# Patient Record
Sex: Female | Born: 1950 | Race: White | Hispanic: No | Marital: Single | Smoking: Former smoker
Health system: Southern US, Community
[De-identification: ages and names within clinical notes are randomized; demographics above are authoritative.]

## PROBLEM LIST (undated history)

## (undated) DIAGNOSIS — J189 Pneumonia, unspecified organism: Secondary | ICD-10-CM

## (undated) DIAGNOSIS — J310 Chronic rhinitis: Secondary | ICD-10-CM

## (undated) DIAGNOSIS — Z8619 Personal history of other infectious and parasitic diseases: Secondary | ICD-10-CM

## (undated) DIAGNOSIS — F32A Depression, unspecified: Secondary | ICD-10-CM

## (undated) DIAGNOSIS — M7022 Olecranon bursitis, left elbow: Secondary | ICD-10-CM

## (undated) DIAGNOSIS — J9801 Acute bronchospasm: Secondary | ICD-10-CM

## (undated) DIAGNOSIS — F419 Anxiety disorder, unspecified: Secondary | ICD-10-CM

## (undated) DIAGNOSIS — R011 Cardiac murmur, unspecified: Secondary | ICD-10-CM

## (undated) DIAGNOSIS — I639 Cerebral infarction, unspecified: Secondary | ICD-10-CM

## (undated) DIAGNOSIS — J321 Chronic frontal sinusitis: Secondary | ICD-10-CM

## (undated) DIAGNOSIS — Z720 Tobacco use: Secondary | ICD-10-CM

## (undated) DIAGNOSIS — J45909 Unspecified asthma, uncomplicated: Secondary | ICD-10-CM

## (undated) DIAGNOSIS — D696 Thrombocytopenia, unspecified: Secondary | ICD-10-CM

## (undated) DIAGNOSIS — J449 Chronic obstructive pulmonary disease, unspecified: Secondary | ICD-10-CM

## (undated) DIAGNOSIS — C9110 Chronic lymphocytic leukemia of B-cell type not having achieved remission: Secondary | ICD-10-CM

## (undated) DIAGNOSIS — C7931 Secondary malignant neoplasm of brain: Secondary | ICD-10-CM

## (undated) DIAGNOSIS — I671 Cerebral aneurysm, nonruptured: Secondary | ICD-10-CM

## (undated) DIAGNOSIS — C3490 Malignant neoplasm of unspecified part of unspecified bronchus or lung: Secondary | ICD-10-CM

## (undated) DIAGNOSIS — I4891 Unspecified atrial fibrillation: Secondary | ICD-10-CM

## (undated) DIAGNOSIS — I341 Nonrheumatic mitral (valve) prolapse: Secondary | ICD-10-CM

## (undated) DIAGNOSIS — J439 Emphysema, unspecified: Secondary | ICD-10-CM

## (undated) DIAGNOSIS — F329 Major depressive disorder, single episode, unspecified: Secondary | ICD-10-CM

## (undated) DIAGNOSIS — T7840XA Allergy, unspecified, initial encounter: Secondary | ICD-10-CM

## (undated) DIAGNOSIS — I340 Nonrheumatic mitral (valve) insufficiency: Secondary | ICD-10-CM

## (undated) DIAGNOSIS — Z8661 Personal history of infections of the central nervous system: Secondary | ICD-10-CM

## (undated) DIAGNOSIS — R51 Headache: Secondary | ICD-10-CM

## (undated) DIAGNOSIS — I071 Rheumatic tricuspid insufficiency: Secondary | ICD-10-CM

## (undated) HISTORY — DX: Personal history of infections of the central nervous system: Z86.61

## (undated) HISTORY — DX: Emphysema, unspecified: J43.9

## (undated) HISTORY — DX: Major depressive disorder, single episode, unspecified: F32.9

## (undated) HISTORY — DX: Depression, unspecified: F32.A

## (undated) HISTORY — DX: Cerebral infarction, unspecified: I63.9

## (undated) HISTORY — DX: Personal history of other infectious and parasitic diseases: Z86.19

## (undated) HISTORY — PX: ABDOMINAL HYSTERECTOMY: SHX81

## (undated) HISTORY — PX: COLONOSCOPY: SHX174

## (undated) HISTORY — DX: Nonrheumatic mitral (valve) prolapse: I34.1

## (undated) HISTORY — DX: Rheumatic tricuspid insufficiency: I07.1

## (undated) HISTORY — DX: Cardiac murmur, unspecified: R01.1

## (undated) HISTORY — DX: Allergy, unspecified, initial encounter: T78.40XA

## (undated) HISTORY — PX: UPPER GASTROINTESTINAL ENDOSCOPY: SHX188

## (undated) HISTORY — DX: Nonrheumatic mitral (valve) insufficiency: I34.0

## (undated) HISTORY — DX: Anxiety disorder, unspecified: F41.9

## (undated) HISTORY — DX: Chronic rhinitis: J31.0

---

## 2001-06-09 ENCOUNTER — Emergency Department (HOSPITAL_COMMUNITY): Admission: EM | Admit: 2001-06-09 | Discharge: 2001-06-09 | Payer: Self-pay | Admitting: Emergency Medicine

## 2002-04-16 DIAGNOSIS — C9110 Chronic lymphocytic leukemia of B-cell type not having achieved remission: Secondary | ICD-10-CM

## 2002-04-16 HISTORY — DX: Chronic lymphocytic leukemia of B-cell type not having achieved remission: C91.10

## 2002-09-02 ENCOUNTER — Other Ambulatory Visit: Admission: RE | Admit: 2002-09-02 | Discharge: 2002-09-02 | Payer: Self-pay | Admitting: Oncology

## 2004-07-03 ENCOUNTER — Emergency Department (HOSPITAL_COMMUNITY): Admission: EM | Admit: 2004-07-03 | Discharge: 2004-07-03 | Payer: Self-pay | Admitting: Emergency Medicine

## 2004-09-26 ENCOUNTER — Ambulatory Visit: Payer: Self-pay | Admitting: Oncology

## 2004-09-27 ENCOUNTER — Ambulatory Visit (HOSPITAL_COMMUNITY): Admission: RE | Admit: 2004-09-27 | Discharge: 2004-09-27 | Payer: Self-pay | Admitting: Oncology

## 2004-11-17 ENCOUNTER — Ambulatory Visit: Payer: Self-pay | Admitting: Oncology

## 2005-02-19 ENCOUNTER — Ambulatory Visit: Payer: Self-pay | Admitting: Oncology

## 2006-05-14 ENCOUNTER — Emergency Department (HOSPITAL_COMMUNITY): Admission: EM | Admit: 2006-05-14 | Discharge: 2006-05-14 | Payer: Self-pay | Admitting: Emergency Medicine

## 2006-11-04 ENCOUNTER — Emergency Department (HOSPITAL_COMMUNITY): Admission: EM | Admit: 2006-11-04 | Discharge: 2006-11-04 | Payer: Self-pay | Admitting: Emergency Medicine

## 2006-11-06 ENCOUNTER — Emergency Department (HOSPITAL_COMMUNITY): Admission: EM | Admit: 2006-11-06 | Discharge: 2006-11-06 | Payer: Self-pay | Admitting: Emergency Medicine

## 2007-01-08 ENCOUNTER — Emergency Department (HOSPITAL_COMMUNITY): Admission: EM | Admit: 2007-01-08 | Discharge: 2007-01-08 | Payer: Self-pay | Admitting: Emergency Medicine

## 2007-09-29 ENCOUNTER — Emergency Department (HOSPITAL_COMMUNITY): Admission: EM | Admit: 2007-09-29 | Discharge: 2007-09-29 | Payer: Self-pay | Admitting: Emergency Medicine

## 2007-10-03 ENCOUNTER — Emergency Department (HOSPITAL_COMMUNITY): Admission: EM | Admit: 2007-10-03 | Discharge: 2007-10-03 | Payer: Self-pay | Admitting: Emergency Medicine

## 2008-03-14 ENCOUNTER — Emergency Department (HOSPITAL_COMMUNITY): Admission: EM | Admit: 2008-03-14 | Discharge: 2008-03-15 | Payer: Self-pay | Admitting: Emergency Medicine

## 2008-03-17 ENCOUNTER — Ambulatory Visit: Payer: Self-pay | Admitting: Oncology

## 2008-04-20 LAB — CBC WITH DIFFERENTIAL/PLATELET
Basophils Absolute: 0.1 10*3/uL (ref 0.0–0.1)
Eosinophils Absolute: 0 10*3/uL (ref 0.0–0.5)
HCT: 42 % (ref 34.8–46.6)
HGB: 14 g/dL (ref 11.6–15.9)
LYMPH%: 83.6 % — ABNORMAL HIGH (ref 14.0–48.0)
MCHC: 33.3 g/dL (ref 32.0–36.0)
MONO#: 0.9 10*3/uL (ref 0.1–0.9)
NEUT#: 5.1 10*3/uL (ref 1.5–6.5)
NEUT%: 13.8 % — ABNORMAL LOW (ref 39.6–76.8)
Platelets: 127 10*3/uL — ABNORMAL LOW (ref 145–400)
WBC: 37.1 10*3/uL — ABNORMAL HIGH (ref 3.9–10.0)

## 2008-04-30 ENCOUNTER — Ambulatory Visit: Payer: Self-pay | Admitting: Oncology

## 2008-06-07 ENCOUNTER — Emergency Department (HOSPITAL_COMMUNITY): Admission: EM | Admit: 2008-06-07 | Discharge: 2008-06-07 | Payer: Self-pay | Admitting: Emergency Medicine

## 2008-10-25 ENCOUNTER — Emergency Department (HOSPITAL_COMMUNITY): Admission: EM | Admit: 2008-10-25 | Discharge: 2008-10-25 | Payer: Self-pay | Admitting: Emergency Medicine

## 2008-12-21 ENCOUNTER — Emergency Department (HOSPITAL_COMMUNITY): Admission: EM | Admit: 2008-12-21 | Discharge: 2008-12-21 | Payer: Self-pay | Admitting: Emergency Medicine

## 2009-02-10 ENCOUNTER — Emergency Department (HOSPITAL_COMMUNITY): Admission: EM | Admit: 2009-02-10 | Discharge: 2009-02-10 | Payer: Self-pay | Admitting: Emergency Medicine

## 2009-05-13 ENCOUNTER — Emergency Department (HOSPITAL_COMMUNITY): Admission: EM | Admit: 2009-05-13 | Discharge: 2009-05-13 | Payer: Self-pay | Admitting: Unknown Physician Specialty

## 2010-01-24 ENCOUNTER — Emergency Department (HOSPITAL_COMMUNITY): Admission: EM | Admit: 2010-01-24 | Discharge: 2010-01-24 | Payer: Self-pay | Admitting: Emergency Medicine

## 2010-03-11 ENCOUNTER — Inpatient Hospital Stay (HOSPITAL_COMMUNITY): Admission: EM | Admit: 2010-03-11 | Discharge: 2010-03-12 | Payer: Self-pay | Admitting: Emergency Medicine

## 2010-06-27 LAB — CBC
HCT: 38 % (ref 36.0–46.0)
Hemoglobin: 12.4 g/dL (ref 12.0–15.0)
Hemoglobin: 13.6 g/dL (ref 12.0–15.0)
MCH: 31.7 pg (ref 26.0–34.0)
MCHC: 32.6 g/dL (ref 30.0–36.0)
MCHC: 33.3 g/dL (ref 30.0–36.0)
MCV: 97.2 fL (ref 78.0–100.0)
RBC: 3.91 MIL/uL (ref 3.87–5.11)
RBC: 4.24 MIL/uL (ref 3.87–5.11)

## 2010-06-27 LAB — POCT CARDIAC MARKERS: Myoglobin, poc: 31.9 ng/mL (ref 12–200)

## 2010-06-27 LAB — DIFFERENTIAL
Basophils Absolute: 0 10*3/uL (ref 0.0–0.1)
Basophils Relative: 0 % (ref 0–1)
Eosinophils Relative: 0 % (ref 0–5)
Eosinophils Relative: 0 % (ref 0–5)
Lymphs Abs: 13.5 10*3/uL — ABNORMAL HIGH (ref 0.7–4.0)
Monocytes Absolute: 0.4 10*3/uL (ref 0.1–1.0)
Monocytes Absolute: 0.5 10*3/uL (ref 0.1–1.0)
Monocytes Relative: 2 % — ABNORMAL LOW (ref 3–12)
Neutrophils Relative %: 31 % — ABNORMAL LOW (ref 43–77)

## 2010-06-27 LAB — BASIC METABOLIC PANEL
CO2: 25 mEq/L (ref 19–32)
Calcium: 8.9 mg/dL (ref 8.4–10.5)
Chloride: 111 mEq/L (ref 96–112)
GFR calc Af Amer: >60 mL/min (ref >60)
GFR calc non Af Amer: >60 mL/min (ref >60)
Glucose, Bld: 143 mg/dL — ABNORMAL HIGH (ref 70–99)
Glucose, Bld: 88 mg/dL (ref 70–99)
Potassium: 4.8 mEq/L (ref 3.5–5.1)
Sodium: 140 mEq/L (ref 135–145)
Sodium: 142 mEq/L (ref 135–145)

## 2010-06-27 LAB — CULTURE, BLOOD (ROUTINE X 2): Culture: NO GROWTH

## 2010-06-27 LAB — LEGIONELLA ANTIGEN, URINE: Legionella Antigen, Urine: NEGATIVE

## 2010-06-27 LAB — MAGNESIUM: Magnesium: 1.9 mg/dL (ref 1.5–2.5)

## 2010-07-21 LAB — STREP A DNA PROBE: Group A Strep Probe: NEGATIVE

## 2010-12-13 ENCOUNTER — Emergency Department (HOSPITAL_COMMUNITY)
Admission: EM | Admit: 2010-12-13 | Discharge: 2010-12-13 | Disposition: A | Discharge status: Home / Self Care | Payer: Self-pay | Attending: Emergency Medicine | Admitting: Emergency Medicine

## 2010-12-13 ENCOUNTER — Emergency Department (HOSPITAL_COMMUNITY): Payer: Self-pay

## 2010-12-13 ENCOUNTER — Other Ambulatory Visit: Payer: Self-pay

## 2010-12-13 ENCOUNTER — Encounter: Payer: Self-pay | Admitting: *Deleted

## 2010-12-13 DIAGNOSIS — J4489 Other specified chronic obstructive pulmonary disease: Secondary | ICD-10-CM | POA: Insufficient documentation

## 2010-12-13 DIAGNOSIS — C9110 Chronic lymphocytic leukemia of B-cell type not having achieved remission: Secondary | ICD-10-CM | POA: Insufficient documentation

## 2010-12-13 DIAGNOSIS — Z7982 Long term (current) use of aspirin: Secondary | ICD-10-CM | POA: Insufficient documentation

## 2010-12-13 DIAGNOSIS — R000 Tachycardia, unspecified: Secondary | ICD-10-CM | POA: Insufficient documentation

## 2010-12-13 DIAGNOSIS — Z9079 Acquired absence of other genital organ(s): Secondary | ICD-10-CM | POA: Insufficient documentation

## 2010-12-13 DIAGNOSIS — J449 Chronic obstructive pulmonary disease, unspecified: Secondary | ICD-10-CM | POA: Insufficient documentation

## 2010-12-13 DIAGNOSIS — R002 Palpitations: Secondary | ICD-10-CM

## 2010-12-13 DIAGNOSIS — E876 Hypokalemia: Secondary | ICD-10-CM

## 2010-12-13 DIAGNOSIS — H6120 Impacted cerumen, unspecified ear: Secondary | ICD-10-CM | POA: Insufficient documentation

## 2010-12-13 DIAGNOSIS — F17200 Nicotine dependence, unspecified, uncomplicated: Secondary | ICD-10-CM | POA: Insufficient documentation

## 2010-12-13 DIAGNOSIS — R51 Headache: Secondary | ICD-10-CM | POA: Insufficient documentation

## 2010-12-13 LAB — BASIC METABOLIC PANEL
CO2: 29 mEq/L (ref 19–32)
Calcium: 9.4 mg/dL (ref 8.4–10.5)
Chloride: 101 mEq/L (ref 96–112)
Creatinine, Ser: 0.53 mg/dL (ref 0.50–1.10)
Glucose, Bld: 138 mg/dL — ABNORMAL HIGH (ref 70–99)

## 2010-12-13 LAB — DIFFERENTIAL
Basophils Relative: 0 % (ref 0–1)
Eosinophils Absolute: 0 10*3/uL (ref 0.0–0.7)
Eosinophils Relative: 0 % (ref 0–5)
Monocytes Absolute: 0.4 10*3/uL (ref 0.1–1.0)
Neutro Abs: 5.1 10*3/uL (ref 1.7–7.7)

## 2010-12-13 LAB — CBC
HCT: 45.6 % (ref 36.0–46.0)
Hemoglobin: 15 g/dL (ref 12.0–15.0)
MCH: 30.9 pg (ref 26.0–34.0)
MCHC: 32.9 g/dL (ref 30.0–36.0)
MCV: 94 fL (ref 78.0–100.0)
RBC: 4.85 MIL/uL (ref 3.87–5.11)

## 2010-12-13 LAB — CARDIAC PANEL(CRET KIN+CKTOT+MB+TROPI)
CK, MB: 2.3 ng/mL (ref 0.3–4.0)
Relative Index: INVALID (ref 0.0–2.5)
Total CK: 29 U/L (ref 7–177)

## 2010-12-13 LAB — SEDIMENTATION RATE: Sed Rate: 5 mm/hr (ref 0–22)

## 2010-12-13 MED ORDER — KETOROLAC TROMETHAMINE 30 MG/ML IJ SOLN
30.0000 mg | Freq: Once | INTRAMUSCULAR | Status: AC
  Administered 2010-12-13: 30 mg via INTRAVENOUS
  Filled 2010-12-13: qty 1

## 2010-12-13 MED ORDER — POTASSIUM CHLORIDE ER 10 MEQ PO TBCR: 10.0000 meq | EXTENDED_RELEASE_TABLET | Freq: Two times a day (BID) | ORAL | Status: DC

## 2010-12-13 MED ORDER — LORAZEPAM 1 MG PO TABS
1.0000 mg | ORAL_TABLET | Freq: Once | ORAL | Status: AC
  Administered 2010-12-13: 1 mg via ORAL
  Filled 2010-12-13: qty 1

## 2010-12-13 MED ORDER — LORAZEPAM 1 MG PO TABS: 1.0000 mg | ORAL_TABLET | Freq: Two times a day (BID) | ORAL | Status: AC | PRN

## 2010-12-13 MED ORDER — OXYCODONE-ACETAMINOPHEN 5-325 MG PO TABS
2.0000 | ORAL_TABLET | Freq: Once | ORAL | Status: AC
  Administered 2010-12-13: 2 via ORAL
  Filled 2010-12-13: qty 2

## 2010-12-13 MED ORDER — NAPROXEN 500 MG PO TABS: 500.0000 mg | ORAL_TABLET | Freq: Two times a day (BID) | ORAL | Status: DC

## 2010-12-13 NOTE — ED Provider Notes (Signed)
History   Scribed for Vida Roller, MD, the patient was seen in room APA02/APA02. This chart was scribed by Clarita Crane. This patient's care was started at 1:14PM.   CSN: [id number] Arrival date & time: 12/13/2010  1:05 PM  Chief Complaint  Patient presents with  . Headache  . Tachycardia   HPI Stephenie I Tatum is a 60 y.o. female who presents to the Emergency Department complaining of intermittent sharp and shooting HA to right forehead onset 3 days ago and persistent since with associated sinus pressure and difficulty hearing from right ear. Reports previous history of HAs but states current HA is not similar to those previously experienced. Notes HA mildly relieved with use of Goody Powder. Patient also c/o intermittent episodes of palpitations lasting 1 hour and described as "my heart beating out of my chest". Notes episodes are not relieved by anything and resolve on their own. Denies nasal congestion, rhinorrhea, vomiting, weakness, changes in vision, change in gait, rash, fever, neck pain, stiff neck.    Past Medical History  Diagnosis Date  . Leukemia     Past Surgical History  Procedure Date  . Abdominal hysterectomy     No family history on file.  History  Substance Use Topics  . Smoking status: Current Everyday Smoker -- 1.0 packs/day    Types: Cigarettes  . Smokeless tobacco: Not on file  . Alcohol Use: No    OB History    Grav Para Term Preterm Abortions TAB SAB Ect Mult Living                  Review of Systems 10 Systems reviewed and are negative for acute change except as noted in the HPI.  Physical Exam  BP 140/66  Pulse 82  Temp(Src) 99.2 F (37.3 C) (Oral)  Resp 18  Ht 5\' 10"  (1.778 m)  Wt 110 lb (49.896 kg)  BMI 15.78 kg/m2  SpO2 95%  Physical Exam  Nursing note and vitals reviewed. Constitutional: She is oriented to person, place, and time. She appears well-developed and well-nourished. No distress.  HENT:  Head: Normocephalic and  atraumatic.  Mouth/Throat: Oropharynx is clear and moist.       Left and Right TM occluded by cerumen. Tenderness to palpation of right temporal artery.   Eyes: Conjunctivae are normal. Pupils are equal, round, and reactive to light.  Neck: Neck supple.       No meningeal signs.   Cardiovascular: Normal rate and regular rhythm.  Exam reveals no gallop and no friction rub.   No murmur heard. Pulmonary/Chest: Effort normal and breath sounds normal. She has no wheezes. She exhibits no tenderness.  Abdominal: Soft. Bowel sounds are normal. She exhibits no distension. There is no tenderness.  Musculoskeletal: Normal range of motion. She exhibits no edema.        Entire spine non-tender. No paraspinal tenderness.   Neurological: She is alert and oriented to person, place, and time. No sensory deficit.       No focal motor or sensory deficits.   Skin: Skin is warm and dry.  Psychiatric: She has a normal mood and affect. Her behavior is normal.    ED Course  Procedures  MDM Well appaering, no focal neuro findings or cariac findings.    Results for orders placed during the hospital encounter of 12/13/10  CBC      Component Value Range   WBC 42.4 (*) 4.0 - 10.5 (K/uL)   RBC 4.85  3.87 -  5.11 (MIL/uL)   Hemoglobin 15.0  12.0 - 15.0 (g/dL)   HCT 10.2  72.5 - 36.6 (%)   MCV 94.0  78.0 - 100.0 (fL)   MCH 30.9  26.0 - 34.0 (pg)   MCHC 32.9  30.0 - 36.0 (g/dL)   RDW 44.0  34.7 - 42.5 (%)   Platelets 155  150 - 400 (K/uL)  DIFFERENTIAL      Component Value Range   Neutrophils Relative 12 (*) 43 - 77 (%)   Lymphocytes Relative 87 (*) 12 - 46 (%)   Monocytes Relative 1 (*) 3 - 12 (%)   Eosinophils Relative 0  0 - 5 (%)   Basophils Relative 0  0 - 1 (%)   Neutro Abs 5.1  1.7 - 7.7 (K/uL)   Lymphs Abs 36.9 (*) 0.7 - 4.0 (K/uL)   Monocytes Absolute 0.4  0.1 - 1.0 (K/uL)   Eosinophils Absolute 0.0  0.0 - 0.7 (K/uL)   Basophils Absolute 0.0  0.0 - 0.1 (K/uL)   WBC Morphology WHITE COUNT  CONFIRMED ON SMEAR     Smear Review LARGE PLATELETS PRESENT    BASIC METABOLIC PANEL      Component Value Range   Sodium 141  135 - 145 (mEq/L)   Potassium 3.4 (*) 3.5 - 5.1 (mEq/L)   Chloride 101  96 - 112 (mEq/L)   CO2 29  19 - 32 (mEq/L)   Glucose, Bld 138 (*) 70 - 99 (mg/dL)   BUN 12  6 - 23 (mg/dL)   Creatinine, Ser 9.56  0.50 - 1.10 (mg/dL)   Calcium 9.4  8.4 - 38.7 (mg/dL)   GFR calc non Af Amer >60  >60 (mL/min)   GFR calc Af Amer >60  >60 (mL/min)  CARDIAC PANEL(CRET KIN+CKTOT+MB+TROPI)      Component Value Range   Total CK 29  7 - 177 (U/L)   CK, MB 2.3  0.3 - 4.0 (ng/mL)   Troponin I <0.30  <0.30 (ng/mL)   Relative Index RELATIVE INDEX IS INVALID  0.0 - 2.5    Dg Chest 2 View  12/13/2010  *RADIOLOGY REPORT*  Clinical Data: Palpitations, history smoking, COPD  CHEST - 2 VIEW  Comparison: 03/11/2010  Findings: Normal heart size, mediastinal contours, and pulmonary vascularity. Emphysematous changes with biapical scarring slightly greater on left. Minimal peribronchial thickening. No pulmonary infiltrate, pleural effusion, or pneumothorax. Mild biconvex thoracolumbar scoliosis.  IMPRESSION: Emphysematous and chronic bronchitic changes with biapical scarring. No acute abnormalities.  Original Report Authenticated By: Lollie Marrow, M.D.   Ct Head Wo Contrast  12/13/2010  *RADIOLOGY REPORT*  Clinical Data: Headache.  CT HEAD WITHOUT CONTRAST  Technique:  Contiguous axial images were obtained from the base of the skull through the vertex without contrast.  Comparison: 02/10/2009  Findings: Mild hypodensity in the inferior frontal white matter bilaterally is unchanged.  Negative for acute infarct.  Ventricles are normal.  Negative for hemorrhage or mass.  Calvarium is intact.  IMPRESSION: Mild hypodensity in the inferior frontal lobes bilaterally is stable and  may be due to chronic ischemia.  No acute abnormality.  Original Report Authenticated By: Camelia Phenes, M.D.   ED ECG  REPORT   Date: 12/13/2010 13:17 PM  Rate: 90  Rhythm: normal sinus rhythm  QRS Axis: normal  Intervals: normal  ST/T Wave abnormalities: normal  Conduction Disutrbances:none  Narrative Interpretation:   Old EKG Reviewed: none available  Blood work reviewed and reveals, high WBC - she is  known to have CLL and is not currently undergoing treatment for same.  She has normal CXR and has CT showing no acute findings.  Toradol with some improvement, percocet ordered.  She states she is very anxious all the time and thinks it may be anxiety - while she is having palpitations here I have auscultated her chest and find her to be in NSR at 80 bpm.  I will refer to doctor for card monitoring as outpt and pt has been informed of her WBC and to f/u with Oncology.  Sed rate normal at 5 per lab phone call.    I personally performed the services described in this documentation, which was scribed in my presence. The recorded information has been reviewed and considered. Vida Roller, MD    Vida Roller, MD 12/13/10 [phone]

## 2010-12-13 NOTE — ED Notes (Signed)
Pt c/o headache and feels like her heart is racing, states that it started 3-4 days ago, has been taking goody powders at home, denies any chest pain states that her heart just feels like it will beat out of her chest.

## 2011-01-16 LAB — DIFFERENTIAL
Basophils Relative: 0
Eosinophils Absolute: 0
Monocytes Absolute: 0.4
Neutro Abs: 5.6

## 2011-01-16 LAB — COMPREHENSIVE METABOLIC PANEL
AST: 18
Albumin: 4
Alkaline Phosphatase: 56
Chloride: 104
GFR calc Af Amer: >60
Potassium: 3.4 — ABNORMAL LOW
Sodium: 139
Total Bilirubin: 0.6

## 2011-01-16 LAB — URINALYSIS, ROUTINE W REFLEX MICROSCOPIC
Bilirubin Urine: NEGATIVE
Ketones, ur: NEGATIVE
Urobilinogen, UA: 0.2
pH: 5.5

## 2011-01-16 LAB — URINE MICROSCOPIC-ADD ON

## 2011-01-16 LAB — CBC
Platelets: 138 — ABNORMAL LOW
WBC: 40.1 — ABNORMAL HIGH

## 2011-01-25 LAB — STREP A DNA PROBE: Group A Strep Probe: NEGATIVE

## 2011-02-15 DIAGNOSIS — J321 Chronic frontal sinusitis: Secondary | ICD-10-CM

## 2011-02-15 DIAGNOSIS — M7022 Olecranon bursitis, left elbow: Secondary | ICD-10-CM

## 2011-02-15 HISTORY — DX: Chronic frontal sinusitis: J32.1

## 2011-02-15 HISTORY — DX: Olecranon bursitis, left elbow: M70.22

## 2011-03-09 ENCOUNTER — Encounter (HOSPITAL_COMMUNITY): Payer: Self-pay | Admitting: *Deleted

## 2011-03-09 ENCOUNTER — Inpatient Hospital Stay (HOSPITAL_COMMUNITY)
Admission: EM | Admit: 2011-03-09 | Discharge: 2011-03-12 | DRG: 603 | Disposition: A | Discharge status: Home / Self Care | Payer: Self-pay | Attending: Internal Medicine | Admitting: Internal Medicine

## 2011-03-09 ENCOUNTER — Emergency Department (HOSPITAL_COMMUNITY): Payer: Self-pay

## 2011-03-09 DIAGNOSIS — J0110 Acute frontal sinusitis, unspecified: Secondary | ICD-10-CM | POA: Diagnosis present

## 2011-03-09 DIAGNOSIS — IMO0002 Reserved for concepts with insufficient information to code with codable children: Principal | ICD-10-CM | POA: Diagnosis present

## 2011-03-09 DIAGNOSIS — M7030 Other bursitis of elbow, unspecified elbow: Secondary | ICD-10-CM | POA: Diagnosis present

## 2011-03-09 DIAGNOSIS — M7020 Olecranon bursitis, unspecified elbow: Secondary | ICD-10-CM | POA: Diagnosis present

## 2011-03-09 DIAGNOSIS — C9110 Chronic lymphocytic leukemia of B-cell type not having achieved remission: Secondary | ICD-10-CM | POA: Diagnosis present

## 2011-03-09 DIAGNOSIS — F17210 Nicotine dependence, cigarettes, uncomplicated: Secondary | ICD-10-CM | POA: Diagnosis present

## 2011-03-09 DIAGNOSIS — L03114 Cellulitis of left upper limb: Secondary | ICD-10-CM

## 2011-03-09 DIAGNOSIS — F17200 Nicotine dependence, unspecified, uncomplicated: Secondary | ICD-10-CM | POA: Diagnosis present

## 2011-03-09 HISTORY — DX: Chronic lymphocytic leukemia of B-cell type not having achieved remission: C91.10

## 2011-03-09 HISTORY — DX: Headache: R51

## 2011-03-09 HISTORY — DX: Acute bronchospasm: J98.01

## 2011-03-09 HISTORY — DX: Pneumonia, unspecified organism: J18.9

## 2011-03-09 LAB — BASIC METABOLIC PANEL
GFR calc Af Amer: >90 mL/min (ref >90)
GFR calc non Af Amer: >90 mL/min (ref >90)
Potassium: 3.6 mEq/L (ref 3.5–5.1)
Sodium: 140 mEq/L (ref 135–145)

## 2011-03-09 LAB — DIFFERENTIAL
Basophils Absolute: 0.1 10*3/uL (ref 0.0–0.1)
Basophils Relative: 0 % (ref 0–1)
Neutro Abs: 8.1 10*3/uL — ABNORMAL HIGH (ref 1.7–7.7)
Neutrophils Relative %: 24 % — ABNORMAL LOW (ref 43–77)

## 2011-03-09 LAB — CBC
Hemoglobin: 13.2 g/dL (ref 12.0–15.0)
MCHC: 32.9 g/dL (ref 30.0–36.0)
RDW: 14.3 % (ref 11.5–15.5)

## 2011-03-09 LAB — URIC ACID: Uric Acid, Serum: 1.7 mg/dL — ABNORMAL LOW (ref 2.4–7.0)

## 2011-03-09 MED ORDER — DOCUSATE SODIUM 100 MG PO CAPS
100.0000 mg | ORAL_CAPSULE | Freq: Two times a day (BID) | ORAL | Status: DC
  Administered 2011-03-09 – 2011-03-12 (×6): 100 mg via ORAL
  Filled 2011-03-09 (×6): qty 1

## 2011-03-09 MED ORDER — ACETAMINOPHEN 325 MG PO TABS: 650.0000 mg | ORAL_TABLET | Freq: Four times a day (QID) | ORAL | Status: DC | PRN

## 2011-03-09 MED ORDER — ACETAMINOPHEN 650 MG RE SUPP: 650.0000 mg | Freq: Four times a day (QID) | RECTAL | Status: DC | PRN

## 2011-03-09 MED ORDER — MORPHINE SULFATE 4 MG/ML IJ SOLN
2.0000 mg | INTRAMUSCULAR | Status: DC | PRN
  Administered 2011-03-10 – 2011-03-11 (×3): 2 mg via INTRAVENOUS
  Filled 2011-03-09 (×3): qty 1

## 2011-03-09 MED ORDER — ALBUTEROL SULFATE HFA 108 (90 BASE) MCG/ACT IN AERS: 2.0000 | INHALATION_SPRAY | RESPIRATORY_TRACT | Status: DC | PRN

## 2011-03-09 MED ORDER — ACETAMINOPHEN 325 MG PO TABS
650.0000 mg | ORAL_TABLET | Freq: Once | ORAL | Status: AC
  Administered 2011-03-09: 650 mg via ORAL
  Filled 2011-03-09: qty 2

## 2011-03-09 MED ORDER — NICOTINE 14 MG/24HR TD PT24
14.0000 mg | MEDICATED_PATCH | Freq: Every day | TRANSDERMAL | Status: DC
  Administered 2011-03-10 – 2011-03-12 (×4): 14 mg via TRANSDERMAL
  Filled 2011-03-09 (×4): qty 1

## 2011-03-09 MED ORDER — OXYCODONE HCL 5 MG PO TABS
5.0000 mg | ORAL_TABLET | ORAL | Status: DC | PRN
  Administered 2011-03-09 – 2011-03-12 (×5): 5 mg via ORAL
  Filled 2011-03-09 (×5): qty 1

## 2011-03-09 MED ORDER — ONDANSETRON HCL 4 MG PO TABS: 4.0000 mg | ORAL_TABLET | Freq: Four times a day (QID) | ORAL | Status: DC | PRN

## 2011-03-09 MED ORDER — ONDANSETRON HCL 4 MG/2ML IJ SOLN: 4.0000 mg | Freq: Four times a day (QID) | INTRAMUSCULAR | Status: DC | PRN

## 2011-03-09 MED ORDER — VANCOMYCIN HCL IN DEXTROSE 1-5 GM/200ML-% IV SOLN
1000.0000 mg | Freq: Once | INTRAVENOUS | Status: AC
  Administered 2011-03-09: 1000 mg via INTRAVENOUS
  Filled 2011-03-09: qty 200

## 2011-03-09 MED ORDER — INFLUENZA VIRUS VACC SPLIT PF IM SUSP
0.5000 mL | INTRAMUSCULAR | Status: AC
  Administered 2011-03-10: 0.5 mL via INTRAMUSCULAR
  Filled 2011-03-09: qty 0.5

## 2011-03-09 MED ORDER — SENNA 8.6 MG PO TABS: 2.0000 | ORAL_TABLET | Freq: Every day | ORAL | Status: DC | PRN

## 2011-03-09 MED ORDER — SODIUM CHLORIDE 0.9 % IV SOLN
INTRAVENOUS | Status: DC
  Administered 2011-03-09: via INTRAVENOUS

## 2011-03-09 NOTE — ED Provider Notes (Signed)
History     CSN: [id number] Arrival date & time: 03/09/2011  7:28 PM   Chief Complaint  Patient presents with  . Joint Swelling    HPI Pt was seen at 1945.  Per pt, c/o gradual onset and worsening of constant left elbow "rash" and "swelling" x2 days.  Has been assoc with subjective home fevers/chills.  Denies injury, no focal motor weakness, no tingling/numbness in extremities.  Also c/o gradual onset and persistence of constant acute flair of her chronic headache, associated with mild nausea, for the past several days.  Denies any change in her usual chronic headache pain pattern, denies headache was sudden or maximal at onset or at any time, no vomiting/diarrhea, no visual changes.   Past Medical History  Diagnosis Date  . Headache   . Chronic lymphocytic leukemia   . Pleurisy   . Bronchospasm     Past Surgical History  Procedure Date  . Abdominal hysterectomy     History  Substance Use Topics  . Smoking status: Current Everyday Smoker -- 1.0 packs/day    Types: Cigarettes  . Smokeless tobacco: Not on file  . Alcohol Use: No   Review of Systems ROS: Statement: All systems negative except as marked or noted in the HPI; Constitutional: +subjective fever and chills. ; ; Eyes: Negative for eye pain, redness and discharge. ; ; ENMT: Negative for ear pain, hoarseness, nasal congestion, sinus pressure and sore throat. ; ; Cardiovascular: Negative for chest pain, palpitations, diaphoresis, dyspnea and peripheral edema. ; ; Respiratory: Negative for cough, wheezing and stridor. ; ; Gastrointestinal: +nausea. Negative for vomiting, diarrhea and abdominal pain, blood in stool, hematemesis, jaundice and rectal bleeding. . ; ; Genitourinary: Negative for dysuria, flank pain and hematuria. ; ; Musculoskeletal: Negative for back pain and neck pain. Negative for trauma.; ; Skin: +left elbow rash and swelling.  Negative for pruritus, abrasions, blisters, bruising and skin lesion.; ; Neuro:  +headache. Negative for lightheadedness and neck stiffness. Negative for weakness, altered level of consciousness , altered mental status, extremity weakness, paresthesias, involuntary movement, seizure and syncope.     Allergies  Penicillins  Home Medications   Current Outpatient Rx  Name Route Sig Dispense Refill  . ALBUTEROL 90 MCG/ACT IN AERS Inhalation Inhale 2 puffs into the lungs every 6 (six) hours as needed. Asthma Symptoms     . GOODY HEADACHE PO Oral Take 1 packet by mouth daily as needed. Headaches     . OXYMETAZOLINE HCL 0.05 % NA SOLN Nasal Place 2 sprays into the nose daily.        BP 98/58  Pulse 70  Temp(Src) 98.6 F (37 C) (Oral)  Resp 18  Ht 5\' 10"  (1.778 m)  Wt 115 lb (52.164 kg)  BMI 16.50 kg/m2  SpO2 95%  Physical Exam 1950: Physical examination:  Nursing notes reviewed; Vital signs and O2 SAT reviewed;  Constitutional: Well developed, Well nourished, Well hydrated, In no acute distress; Head:  Normocephalic, atraumatic; Eyes: EOMI, PERRL, No scleral icterus; ENMT: Mouth and pharynx normal, Mucous membranes moist; Neck: Supple, Full range of motion, No lymphadenopathy; Cardiovascular: Regular rate and rhythm, No murmur, rub, or gallop; Respiratory: Breath sounds clear & equal bilaterally, No rales, rhonchi, wheezes, or rub, Normal respiratory effort/excursion; Chest: Nontender, Movement normal; Abdomen: Soft, Nontender, Nondistended, Normal bowel sounds; Extremities: Pulses normal, No left axillary lymphadenopathy. No calf edema or asymmetry.; Neuro: AA&Ox3, Major CN grossly intact. No facial droop, speech clear.  No gross focal motor or sensory  deficits in extremities.; Skin: Color normal, Warm, Dry, +localized edema and erythema to right elbow which streaks up medial upper arm, proximal-medial volar forearm, and wraps around to antecubital area.   ED Course  Procedures    MDM  MDM Reviewed: nursing note and vitals Reviewed previous: labs Interpretation:  labs and x-ray   Results for orders placed during the hospital encounter of 03/09/11  CBC      Component Value Range   WBC 33.9 (*) 4.0 - 10.5 (K/uL)   RBC 4.19  3.87 - 5.11 (MIL/uL)   Hemoglobin 13.2  12.0 - 15.0 (g/dL)   HCT 47.8  29.5 - 62.1 (%)   MCV 95.7  78.0 - 100.0 (fL)   MCH 31.5  26.0 - 34.0 (pg)   MCHC 32.9  30.0 - 36.0 (g/dL)   RDW 30.8  65.7 - 84.6 (%)   Platelets 120 (*) 150 - 400 (K/uL)  DIFFERENTIAL      Component Value Range   Neutrophils Relative 24 (*) 43 - 77 (%)   Neutro Abs 8.1 (*) 1.7 - 7.7 (K/uL)   Lymphocytes Relative 76 (*) 12 - 46 (%)   Lymphs Abs 25.7 (*) 0.7 - 4.0 (K/uL)   Monocytes Relative 0 (*) 3 - 12 (%)   Monocytes Absolute 0.0 (*) 0.1 - 1.0 (K/uL)   Eosinophils Relative 0  0 - 5 (%)   Eosinophils Absolute 0.1  0.0 - 0.7 (K/uL)   Basophils Relative 0  0 - 1 (%)   Basophils Absolute 0.1  0.0 - 0.1 (K/uL)   WBC Morphology WHITE COUNT CONFIRMED ON SMEAR    BASIC METABOLIC PANEL      Component Value Range   Sodium 140  135 - 145 (mEq/L)   Potassium 3.6  3.5 - 5.1 (mEq/L)   Chloride 104  96 - 112 (mEq/L)   CO2 28  19 - 32 (mEq/L)   Glucose, Bld 95  70 - 99 (mg/dL)   BUN 8  6 - 23 (mg/dL)   Creatinine, Ser 9.62  0.50 - 1.10 (mg/dL)   Calcium 9.1  8.4 - 95.2 (mg/dL)   GFR calc non Af Amer >90  >90 (mL/min)   GFR calc Af Amer >90  >90 (mL/min)  LACTIC ACID, PLASMA      Component Value Range   Lactic Acid, Venous 1.0  0.5 - 2.2 (mmol/L)   Dg Elbow Complete Left  03/09/2011  *RADIOLOGY REPORT*  Clinical Data: Left elbow pain, swelling and redness.  LEFT ELBOW - COMPLETE 3+ VIEW  Comparison: The  Findings: There is soft tissue swelling over the olecranon.  No definite joint effusion.  No fracture.  IMPRESSION: Soft tissue swelling over the olecranon is indicative of olecranon bursitis.  Original Report Authenticated By: Reyes Ivan, M.D.   9:43 PM:  Unable to aspirate left olecranon bursa due to overlying diffuse cellulitic rash.  Rash also  streaking up and down arm and wrapping around to antecubital fossa.  No fevers in ED.  WBC elevated, but per pt's norm/baseline.  No fevers in ED but reports "chills" at home. Cellulitis vs bursitis at this time.  Will dose IV vancomycin.  Headache improved with tylenol, no neuro changes.  Dx testing d/w pt and family.  Questions answered.  Verb understanding, agreeable to admit. T/C to [facility] Dr. Rito Ehrlich, case discussed, including:  HPI, pertinent PM/SHx, VS/PE, dx testing, ED course and treatment.  Agreeable to admit.  Requests to write temporary orders, medical bed.  [facility] M I personally performed the services described in this documentation, which was scribed in my presence. The recorded information has been reviewed and considered.        Laray Anger, DO 03/10/11 2258

## 2011-03-09 NOTE — H&P (Signed)
Sheri Ayala is an 60 y.o. female.    PCP: DAVIS,JEROME, MD her oncologist is Dr. Alona Bene in [place].  Chief Complaint: Pain in the left elbow  HPI: This is a 60 year old, Caucasian female, with a past medical history of CLL in remission, who was in her usual state of health about 2 days ago, when she started noticing that the left elbow was reddish in color, and had pain in that area. She applied ice to the region. However, the pain increased. The redness spread to the upper arm as well as the forearm. Denied any fever, but did have chills. Pain is 9/10 in intensity and is made worse by movement of the elbow. Ice seems to help. No radiation of the pain. She has some nausea, but denies any vomiting. Denies any abdominal pain. Denies any problems urinating. Has been having headaches, but this is a known issue for her. She mentioned that years ago she had fluid in that joint, which was removed, with a needle. She remains unclear as to how, long ago this was.   Prior to Admission medications   Medication Sig Start Date End Date Taking? Authorizing Provider  albuterol (PROVENTIL,VENTOLIN) 90 MCG/ACT inhaler Inhale 2 puffs into the lungs every 6 (six) hours as needed. Asthma Symptoms    Yes Historical Provider, MD  Aspirin-Acetaminophen-Caffeine (GOODY HEADACHE PO) Take 1 packet by mouth daily as needed. Headaches    Yes Historical Provider, MD  oxymetazoline (NASAL) 0.05 % nasal spray Place 2 sprays into the nose daily.     Yes Historical Provider, MD    Allergies:  Allergies  Allergen Reactions  . Penicillins Hives and Nausea And Vomiting    Past Medical History  Diagnosis Date  . Headache   . Chronic lymphocytic leukemia   . Pleurisy   . Bronchospasm     Past Surgical History  Procedure Date  . Abdominal hysterectomy     Social History:  reports that she has been smoking Cigarettes.  She has been smoking about 1 pack per day. She does not have any smokeless tobacco history on file.  She reports that she does not drink alcohol or use illicit drugs.  Family History: Mother had brain and thoracic aneurysm  Review of Systems - History obtained from the patient General ROS: negative Psychological ROS: negative ENT ROS: negative Allergy and Immunology ROS: negative Hematological and Lymphatic ROS: negative Endocrine ROS: negative Respiratory ROS: no cough, shortness of breath, or wheezing Cardiovascular ROS: no chest pain or dyspnea on exertion Gastrointestinal ROS: no abdominal pain, change in bowel habits, or black or bloody stools Genito-Urinary ROS: no dysuria, trouble voiding, or hematuria Musculoskeletal ROS: As in HPI Neurological ROS: negative Dermatological ROS: as in hpi Otherwise negative  Physical Examination Blood pressure 98/58, pulse 70, temperature 98.6 F (37 C), temperature source Oral, resp. rate 18, height 5\' 10"  (1.778 m), weight 52.164 kg (115 lb), SpO2 95.00%.   General appearance: alert, cooperative, appears older than stated age and no distress Head: Normocephalic, without obvious abnormality, atraumatic Eyes: conjunctivae/corneas clear. PERRL, EOM's intact. Fundi benign. Nose: Nares normal. Septum midline. Mucosa normal. No drainage or sinus tenderness. Neck: no adenopathy, no carotid bruit, no JVD, supple, symmetrical, trachea midline and thyroid not enlarged, symmetric, no tenderness/mass/nodules Resp: clear to auscultation bilaterally Cardio: regular rate and rhythm, S1, S2 normal, no murmur, click, rub or gallop GI: soft, non-tender; bowel sounds normal; no masses,  no organomegaly Extremities: Left elbow is swollen, erythematous, tender. Restricted ROM. Good  pulses. No axillary lymphandeopathy. Redness spread to upper arm and forearm. Pulses: 2+ and symmetric Skin: erythema over left elbow Lymph nodes: Cervical, supraclavicular, and axillary nodes normal. Neurologic: Alert and oriented X 3, normal strength and tone. Normal symmetric  reflexes. Normal coordination and gait Redness over left elbow  Results for orders placed during the hospital encounter of 03/09/11 (from the past 48 hour(s))  CBC     Status: Abnormal   Collection Time   03/09/11  8:02 PM      Component Value Range Comment   WBC 33.9 (*) 4.0 - 10.5 (K/uL)    RBC 4.19  3.87 - 5.11 (MIL/uL)    Hemoglobin 13.2  12.0 - 15.0 (g/dL)    HCT 16.1  09.6 - 04.5 (%)    MCV 95.7  78.0 - 100.0 (fL)    MCH 31.5  26.0 - 34.0 (pg)    MCHC 32.9  30.0 - 36.0 (g/dL)    RDW 40.9  81.1 - 91.4 (%)    Platelets 120 (*) 150 - 400 (K/uL)   DIFFERENTIAL     Status: Abnormal   Collection Time   03/09/11  8:02 PM      Component Value Range Comment   Neutrophils Relative 24 (*) 43 - 77 (%)    Neutro Abs 8.1 (*) 1.7 - 7.7 (K/uL)    Lymphocytes Relative 76 (*) 12 - 46 (%)    Lymphs Abs 25.7 (*) 0.7 - 4.0 (K/uL)    Monocytes Relative 0 (*) 3 - 12 (%)    Monocytes Absolute 0.0 (*) 0.1 - 1.0 (K/uL)    Eosinophils Relative 0  0 - 5 (%)    Eosinophils Absolute 0.1  0.0 - 0.7 (K/uL)    Basophils Relative 0  0 - 1 (%)    Basophils Absolute 0.1  0.0 - 0.1 (K/uL)    WBC Morphology WHITE COUNT CONFIRMED ON SMEAR     BASIC METABOLIC PANEL     Status: Normal   Collection Time   03/09/11  8:02 PM      Component Value Range Comment   Sodium 140  135 - 145 (mEq/L)    Potassium 3.6  3.5 - 5.1 (mEq/L)    Chloride 104  96 - 112 (mEq/L)    CO2 28  19 - 32 (mEq/L)    Glucose, Bld 95  70 - 99 (mg/dL)    BUN 8  6 - 23 (mg/dL)    Creatinine, Ser 7.82  0.50 - 1.10 (mg/dL)    Calcium 9.1  8.4 - 10.5 (mg/dL)    GFR calc non Af Amer >90  >90 (mL/min)    GFR calc Af Amer >90  >90 (mL/min)   LACTIC ACID, PLASMA     Status: Normal   Collection Time   03/09/11  8:03 PM      Component Value Range Comment   Lactic Acid, Venous 1.0  0.5 - 2.2 (mmol/L)    Dg Elbow Complete Left  03/09/2011  *RADIOLOGY REPORT*  Clinical Data: Left elbow pain, swelling and redness.  LEFT ELBOW - COMPLETE 3+ VIEW   Comparison: The  Findings: There is soft tissue swelling over the olecranon.  No definite joint effusion.  No fracture.  IMPRESSION: Soft tissue swelling over the olecranon is indicative of olecranon bursitis.  Original Report Authenticated By: Reyes Ivan, M.D.     Assessment/Plan  Principal Problem:  *Cellulitis of elbow Active Problems:  Bursitis of elbow  CLL (chronic lymphocytic  leukemia)  Tobacco abuse   #1 cellulitis of the elbow versus bursitis. Since that she did have chills and even though her white cell count is elevated probably from CLL, because of the spreading erythema we will treat this as an infection. Give her vancomycin for now. We will have orthopedics evaluate this in the morning. We'll get blood cultures and check a uric acid level. She'll be given pain control and ice will be applied to the left elbow.  #2 tobacco abuse: will give her nicotine patch.  #3 history of CLL: This is in remission.  DVT prophylaxis will be initiated. Patient is a full code.  Further management decisions will depend on results of further testing and patient's response to treatment.  Carlis Blanchard 03/09/2011, 10:03 PM

## 2011-03-09 NOTE — ED Notes (Addendum)
Headache for a week located over right eye area, swelling and redness to left elbow started two days ago, denies any injury, admits to nausea,

## 2011-03-10 DIAGNOSIS — J0110 Acute frontal sinusitis, unspecified: Secondary | ICD-10-CM | POA: Diagnosis present

## 2011-03-10 LAB — COMPREHENSIVE METABOLIC PANEL
Albumin: 3 g/dL — ABNORMAL LOW (ref 3.5–5.2)
BUN: 7 mg/dL (ref 6–23)
CO2: 27 mEq/L (ref 19–32)
Chloride: 106 mEq/L (ref 96–112)
Creatinine, Ser: 0.46 mg/dL — ABNORMAL LOW (ref 0.50–1.10)
GFR calc Af Amer: >90 mL/min (ref >90)
GFR calc non Af Amer: >90 mL/min (ref >90)
Glucose, Bld: 106 mg/dL — ABNORMAL HIGH (ref 70–99)
Total Bilirubin: 0.4 mg/dL (ref 0.3–1.2)

## 2011-03-10 LAB — CBC
HCT: 35.8 % — ABNORMAL LOW (ref 36.0–46.0)
Hemoglobin: 11.6 g/dL — ABNORMAL LOW (ref 12.0–15.0)
MCV: 96.2 fL (ref 78.0–100.0)
RDW: 14.4 % (ref 11.5–15.5)
WBC: 24.5 10*3/uL — ABNORMAL HIGH (ref 4.0–10.5)

## 2011-03-10 MED ORDER — PSEUDOEPHEDRINE HCL ER 120 MG PO TB12
120.0000 mg | ORAL_TABLET | Freq: Two times a day (BID) | ORAL | Status: DC
  Administered 2011-03-10 – 2011-03-12 (×5): 120 mg via ORAL
  Filled 2011-03-10 (×9): qty 1

## 2011-03-10 MED ORDER — OXYMETAZOLINE HCL 0.05 % NA SOLN
2.0000 | Freq: Two times a day (BID) | NASAL | Status: DC
  Administered 2011-03-10 – 2011-03-12 (×5): 2 via NASAL
  Filled 2011-03-10: qty 30

## 2011-03-10 MED ORDER — IBUPROFEN 800 MG PO TABS
400.0000 mg | ORAL_TABLET | Freq: Three times a day (TID) | ORAL | Status: DC
  Administered 2011-03-10: 400 mg via ORAL
  Administered 2011-03-11: 15:00:00 via ORAL
  Administered 2011-03-11 (×2): 400 mg via ORAL
  Administered 2011-03-12 (×2): via ORAL
  Filled 2011-03-10 (×3): qty 1
  Filled 2011-03-10: qty 2
  Filled 2011-03-10 (×3): qty 1

## 2011-03-10 MED ORDER — VANCOMYCIN HCL IN DEXTROSE 1-5 GM/200ML-% IV SOLN
1000.0000 mg | INTRAVENOUS | Status: DC
  Administered 2011-03-10 – 2011-03-11 (×2): 1000 mg via INTRAVENOUS
  Filled 2011-03-10 (×3): qty 200

## 2011-03-10 MED ORDER — TRAZODONE HCL 50 MG PO TABS
50.0000 mg | ORAL_TABLET | Freq: Every evening | ORAL | Status: DC | PRN
  Administered 2011-03-10 – 2011-03-11 (×3): 50 mg via ORAL
  Filled 2011-03-10 (×3): qty 1

## 2011-03-10 MED ORDER — LEVOFLOXACIN 500 MG PO TABS
500.0000 mg | ORAL_TABLET | Freq: Every day | ORAL | Status: DC
  Administered 2011-03-10 – 2011-03-12 (×3): 500 mg via ORAL
  Filled 2011-03-10 (×3): qty 1

## 2011-03-10 MED ORDER — PSEUDOEPHEDRINE HCL ER 120 MG PO TB12
120.0000 mg | ORAL_TABLET | Freq: Two times a day (BID) | ORAL | Status: DC
  Filled 2011-03-10 (×5): qty 1

## 2011-03-10 MED ORDER — PSEUDOEPHEDRINE HCL 60 MG PO TABS
60.0000 mg | ORAL_TABLET | Freq: Three times a day (TID) | ORAL | Status: DC
  Filled 2011-03-10 (×2): qty 1

## 2011-03-10 NOTE — Progress Notes (Signed)
ANTIBIOTIC CONSULT NOTE - INITIAL  Pharmacy Consult for Vancomycin  Indication: cellulitis  Allergies  Allergen Reactions  . Penicillins Hives and Nausea And Vomiting    Patient Measurements: Height: 5\' 10"  (177.8 cm) Weight: 114 lb 6.7 oz (51.9 kg) IBW/kg (Calculated) : 68.5    Vital Signs: Temp: 97.9 F (36.6 C) (11/24 0540) Temp src: Oral (11/24 0540) BP: 103/61 mmHg (11/24 0540) Pulse Rate: 69  (11/24 0540) Intake/Output from previous day:   Intake/Output from this shift:    Labs:  [facility] 03/10/11 0645 03/09/11 2002  WBC 24.5* 33.9*  HGB 11.6* 13.2  PLT 108* 120*  LABCREA -- --  CREATININE 0.46* 0.51   Estimated Creatinine Clearance: 60.5 ml/min (by C-G formula based on Cr of 0.46). No results found for this basename: VANCOTROUGH:2,VANCOPEAK:2,VANCORANDOM:2,GENTTROUGH:2,GENTPEAK:2,GENTRANDOM:2,TOBRATROUGH:2,TOBRAPEAK:2,TOBRARND:2,AMIKACINPEAK:2,AMIKACINTROU:2,AMIKACIN:2, in the last 72 hours   Microbiology: Recent Results (from the past 720 hour(s))  CULTURE, BLOOD (ROUTINE X 2)     Status: Normal (Preliminary result)   Collection Time   03/09/11 11:17 PM      Component Value Range Status Comment   Specimen Description BLOOD BLOOD RIGHT HAND   Final    Special Requests BOTTLES DRAWN AEROBIC AND ANAEROBIC 9CC   Final    Culture NO GROWTH 1 DAY   Final    Report Status PENDING   Incomplete   CULTURE, BLOOD (ROUTINE X 2)     Status: Normal (Preliminary result)   Collection Time   03/09/11 11:23 PM      Component Value Range Status Comment   Specimen Description BLOOD BLOOD LEFT ARM   Final    Special Requests BOTTLES DRAWN AEROBIC AND ANAEROBIC 7CC   Final    Culture NO GROWTH 1 DAY   Final    Report Status PENDING   Incomplete     Medical History: Past Medical History  Diagnosis Date  . Headache   . Pleurisy   . Bronchospasm   . Chronic lymphocytic leukemia   . Pneumonia     Medications:  Scheduled:    . acetaminophen  650 mg Oral Once  .  docusate sodium  100 mg Oral BID  . influenza  inactive virus vaccine  0.5 mL Intramuscular Tomorrow-1000  . nicotine  14 mg Transdermal Daily  . vancomycin  1,000 mg Intravenous Once  . vancomycin  1,000 mg Intravenous Q24H   Assessment: Ok for protocol   Goal of Therapy:  Vancomycin trough level 10-15 mcg/ml  Plan:  Vancomycin 1 GM IV every 24 hours (first dose given last night) Measure antibiotic drug levels at steady state Monitor renal function  Raquel James, Ramsie Ostrander Bennett 03/10/2011,7:23 AM

## 2011-03-10 NOTE — Progress Notes (Signed)
Chart reviewed.  Subjective: Arm pain and swelling and redness is better, but complaining of headache "like sinusitis". She reports having had a cold for the past 3 weeks accompanied by right frontal headache, pressure. She's also had bilateral maxillary sinus area pain and pressure. She's had no rhinorrhea or postnasal drip. No photophobia. Objective: Vital signs in last 24 hours: Filed Vitals:   03/09/11 1926 03/09/11 2213 03/09/11 2304 03/10/11 0540  BP: 98/58 119/48 108/62 103/61  Pulse: 70 73 65 69  Temp: 98.6 F (37 C)  98 F (36.7 C) 97.9 F (36.6 C)  TempSrc: Oral  Oral Oral  Resp: 18 20 20 20   Height:   5\' 10"  (1.778 m)   Weight:   51.9 kg (114 lb 6.7 oz)   SpO2: 95% 97% 94% 93%   Weight change:   Intake/Output Summary (Last 24 hours) at 03/10/11 1343 Last data filed at 03/10/11 1300  Gross per 24 hour  Intake    480 ml  Output    600 ml  Net   -120 ml   Physical Exam: Gen.: Alert oriented and comfortable HEENT: No sinus tenderness. Normocephalic atraumatic. No nasal drainage. Lungs clear to auscultation bilaterally without wheeze rhonchi or rales Cardiovascular regular rate rhythm without murmurs gallops rubs Abdomen soft nontender nondistended Extremities left arm with erythema and induration and tenderness proximal and distal to the elbow  Lab Results: Basic Metabolic Panel:  Lab 03/10/11 1610 03/09/11 2002  NA 140 140  K 3.2* 3.6  CL 106 104  CO2 27 28  GLUCOSE 106* 95  BUN 7 8  CREATININE 0.46* 0.51  CALCIUM 8.8 9.1  MG -- --  PHOS -- --   Liver Function Tests:  Lab 03/10/11 0645  AST 11  ALT 9  ALKPHOS 57  BILITOT 0.4  PROT 5.5*  ALBUMIN 3.0*   No results found for this basename: LIPASE:2,AMYLASE:2 in the last 168 hours No results found for this basename: AMMONIA:2 in the last 168 hours CBC:  Lab 03/10/11 0645 03/09/11 2002  WBC 24.5* 33.9*  NEUTROABS -- 8.1*  HGB 11.6* 13.2  HCT 35.8* 40.1  MCV 96.2 95.7  PLT 108* 120*   Micro  Results: Recent Results (from the past 240 hour(s))  CULTURE, BLOOD (ROUTINE X 2)     Status: Normal (Preliminary result)   Collection Time   03/09/11 11:17 PM      Component Value Range Status Comment   Specimen Description BLOOD BLOOD RIGHT HAND   Final    Special Requests BOTTLES DRAWN AEROBIC AND ANAEROBIC 9CC   Final    Culture NO GROWTH 1 DAY   Final    Report Status PENDING   Incomplete   CULTURE, BLOOD (ROUTINE X 2)     Status: Normal (Preliminary result)   Collection Time   03/09/11 11:23 PM      Component Value Range Status Comment   Specimen Description BLOOD BLOOD LEFT ARM   Final    Special Requests BOTTLES DRAWN AEROBIC AND ANAEROBIC 7CC   Final    Culture NO GROWTH 1 DAY   Final    Report Status PENDING   Incomplete    Studies/Results: Dg Elbow Complete Left  03/09/2011  *RADIOLOGY REPORT*  Clinical Data: Left elbow pain, swelling and redness.  LEFT ELBOW - COMPLETE 3+ VIEW  Comparison: The  Findings: There is soft tissue swelling over the olecranon.  No definite joint effusion.  No fracture.  IMPRESSION: Soft tissue swelling over the  olecranon is indicative of olecranon bursitis.  Original Report Authenticated By: Reyes Ivan, M.D.   Scheduled Meds:   . acetaminophen  650 mg Oral Once  . docusate sodium  100 mg Oral BID  . influenza  inactive virus vaccine  0.5 mL Intramuscular Tomorrow-1000  . nicotine  14 mg Transdermal Daily  . vancomycin  1,000 mg Intravenous Once  . vancomycin  1,000 mg Intravenous Q24H   Continuous Infusions:   . sodium chloride 100 mL/hr at 03/09/11 2359   PRN Meds:.acetaminophen, acetaminophen, albuterol, morphine, ondansetron (ZOFRAN) IV, ondansetron, oxyCODONE, senna, traZODone Assessment/Plan: Principal Problem:  *Cellulitis of elbow Active Problems:  Bursitis of elbow  CLL (chronic lymphocytic leukemia)  Tobacco abuse  headache most likely secondary to sinusitis.  Continue vancomycin. Await orthopedic evaluation. Give  anti-inflammatories, decongestants, and add levofloxacin better coverage of sinusitis.   LOS: 1 day   Anvith Mauriello L 03/10/2011, 1:43 PM

## 2011-03-11 ENCOUNTER — Encounter (HOSPITAL_COMMUNITY): Payer: Self-pay | Admitting: Orthopedic Surgery

## 2011-03-11 LAB — BASIC METABOLIC PANEL
CO2: 26 mEq/L (ref 19–32)
Chloride: 103 mEq/L (ref 96–112)
GFR calc Af Amer: >90 mL/min (ref >90)
Potassium: 3.6 mEq/L (ref 3.5–5.1)
Sodium: 135 mEq/L (ref 135–145)

## 2011-03-11 MED ORDER — SODIUM CHLORIDE 0.9 % IJ SOLN
INTRAMUSCULAR | Status: AC
  Administered 2011-03-11: 14:00:00
  Filled 2011-03-11: qty 3

## 2011-03-11 NOTE — Consult Note (Signed)
Reason for Consult:cellulitis left elbow  Referring Physician: Dr Rito Ehrlich   Sheri Ayala is an 60 y.o. female. With elbow pain and swelling  HPI: see hp, reviewed  Several days of pain swelling and erythema over the left elbow w/ h/o previous bursitis s/p aspiration years ago presented back with cellulitis and was admitted for IV antibiotics   Past Medical History  Diagnosis Date  . Headache   . Pleurisy   . Bronchospasm   . Chronic lymphocytic leukemia   . Pneumonia     Past Surgical History  Procedure Date  . Abdominal hysterectomy     Family History  Problem Relation Age of Onset  . Aneurysm Mother     Social History:  reports that she has been smoking Cigarettes.  She has been smoking about 1 pack per day. She does not have any smokeless tobacco history on file. She reports that she does not drink alcohol or use illicit drugs.  Allergies:  Allergies  Allergen Reactions  . Penicillins Hives and Nausea And Vomiting    Medications: I have reviewed the patient's current medications.  Results for orders placed during the hospital encounter of 03/09/11 (from the past 48 hour(s))  CBC     Status: Abnormal   Collection Time   03/09/11  8:02 PM      Component Value Range Comment   WBC 33.9 (*) 4.0 - 10.5 (K/uL)    RBC 4.19  3.87 - 5.11 (MIL/uL)    Hemoglobin 13.2  12.0 - 15.0 (g/dL)    HCT 47.8  29.5 - 62.1 (%)    MCV 95.7  78.0 - 100.0 (fL)    MCH 31.5  26.0 - 34.0 (pg)    MCHC 32.9  30.0 - 36.0 (g/dL)    RDW 30.8  65.7 - 84.6 (%)    Platelets 120 (*) 150 - 400 (K/uL)   DIFFERENTIAL     Status: Abnormal   Collection Time   03/09/11  8:02 PM      Component Value Range Comment   Neutrophils Relative 24 (*) 43 - 77 (%)    Neutro Abs 8.1 (*) 1.7 - 7.7 (K/uL)    Lymphocytes Relative 76 (*) 12 - 46 (%)    Lymphs Abs 25.7 (*) 0.7 - 4.0 (K/uL)    Monocytes Relative 0 (*) 3 - 12 (%)    Monocytes Absolute 0.0 (*) 0.1 - 1.0 (K/uL)    Eosinophils Relative 0  0 - 5 (%)    Eosinophils Absolute 0.1  0.0 - 0.7 (K/uL)    Basophils Relative 0  0 - 1 (%)    Basophils Absolute 0.1  0.0 - 0.1 (K/uL)    WBC Morphology WHITE COUNT CONFIRMED ON SMEAR     BASIC METABOLIC PANEL     Status: Normal   Collection Time   03/09/11  8:02 PM      Component Value Range Comment   Sodium 140  135 - 145 (mEq/L)    Potassium 3.6  3.5 - 5.1 (mEq/L)    Chloride 104  96 - 112 (mEq/L)    CO2 28  19 - 32 (mEq/L)    Glucose, Bld 95  70 - 99 (mg/dL)    BUN 8  6 - 23 (mg/dL)    Creatinine, Ser 9.62  0.50 - 1.10 (mg/dL)    Calcium 9.1  8.4 - 10.5 (mg/dL)    GFR calc non Af Amer >90  >90 (mL/min)    GFR calc  Af Amer >90  >90 (mL/min)   LACTIC ACID, PLASMA     Status: Normal   Collection Time   03/09/11  8:03 PM      Component Value Range Comment   Lactic Acid, Venous 1.0  0.5 - 2.2 (mmol/L)   CULTURE, BLOOD (ROUTINE X 2)     Status: Normal (Preliminary result)   Collection Time   03/09/11 11:17 PM      Component Value Range Comment   Specimen Description BLOOD BLOOD RIGHT HAND      Special Requests BOTTLES DRAWN AEROBIC AND ANAEROBIC 9CC      Culture NO GROWTH 2 DAYS      Report Status PENDING     CULTURE, BLOOD (ROUTINE X 2)     Status: Normal (Preliminary result)   Collection Time   03/09/11 11:23 PM      Component Value Range Comment   Specimen Description BLOOD BLOOD LEFT ARM      Special Requests BOTTLES DRAWN AEROBIC AND ANAEROBIC 7CC      Culture NO GROWTH 2 DAYS      Report Status PENDING     URIC ACID     Status: Abnormal   Collection Time   03/09/11 11:23 PM      Component Value Range Comment   Uric Acid, Serum 1.7 (*) 2.4 - 7.0 (mg/dL)   CBC     Status: Abnormal   Collection Time   03/10/11  6:45 AM      Component Value Range Comment   WBC 24.5 (*) 4.0 - 10.5 (K/uL)    RBC 3.72 (*) 3.87 - 5.11 (MIL/uL)    Hemoglobin 11.6 (*) 12.0 - 15.0 (g/dL)    HCT 91.4 (*) 78.2 - 46.0 (%)    MCV 96.2  78.0 - 100.0 (fL)    MCH 31.2  26.0 - 34.0 (pg)    MCHC 32.4  30.0 -  36.0 (g/dL)    RDW 95.6  21.3 - 08.6 (%)    Platelets 108 (*) 150 - 400 (K/uL)   COMPREHENSIVE METABOLIC PANEL     Status: Abnormal   Collection Time   03/10/11  6:45 AM      Component Value Range Comment   Sodium 140  135 - 145 (mEq/L)    Potassium 3.2 (*) 3.5 - 5.1 (mEq/L)    Chloride 106  96 - 112 (mEq/L)    CO2 27  19 - 32 (mEq/L)    Glucose, Bld 106 (*) 70 - 99 (mg/dL)    BUN 7  6 - 23 (mg/dL)    Creatinine, Ser 5.78 (*) 0.50 - 1.10 (mg/dL)    Calcium 8.8  8.4 - 10.5 (mg/dL)    Total Protein 5.5 (*) 6.0 - 8.3 (g/dL)    Albumin 3.0 (*) 3.5 - 5.2 (g/dL)    AST 11  0 - 37 (U/L)    ALT 9  0 - 35 (U/L)    Alkaline Phosphatase 57  39 - 117 (U/L)    Total Bilirubin 0.4  0.3 - 1.2 (mg/dL)    GFR calc non Af Amer >90  >90 (mL/min)    GFR calc Af Amer >90  >90 (mL/min)   BASIC METABOLIC PANEL     Status: Abnormal   Collection Time   03/11/11  6:50 AM      Component Value Range Comment   Sodium 135  135 - 145 (mEq/L)    Potassium 3.6  3.5 - 5.1 (mEq/L)  Chloride 103  96 - 112 (mEq/L)    CO2 26  19 - 32 (mEq/L)    Glucose, Bld 99  70 - 99 (mg/dL)    BUN 7  6 - 23 (mg/dL)    Creatinine, Ser 8.11 (*) 0.50 - 1.10 (mg/dL)    Calcium 8.9  8.4 - 10.5 (mg/dL)    GFR calc non Af Amer >90  >90 (mL/min)    GFR calc Af Amer >90  >90 (mL/min)     Dg Elbow Complete Left  03/09/2011  *RADIOLOGY REPORT*  Clinical Data: Left elbow pain, swelling and redness.  LEFT ELBOW - COMPLETE 3+ VIEW  Comparison: The  Findings: There is soft tissue swelling over the olecranon.  No definite joint effusion.  No fracture.  IMPRESSION: Soft tissue swelling over the olecranon is indicative of olecranon bursitis.  Original Report Authenticated By: Reyes Ivan, M.D.    Review of Systems  Constitutional: Positive for fever and chills. Negative for malaise/fatigue.  Eyes: Negative for blurred vision.  Respiratory: Negative for shortness of breath.   Cardiovascular: Negative for chest pain.    Gastrointestinal: Negative for heartburn.  Genitourinary: Negative for dysuria.  Musculoskeletal: Positive for joint pain.  Skin: Negative for rash.  Neurological: Positive for tingling. Negative for dizziness and headaches.  Endo/Heme/Allergies: Negative for polydipsia.  Psychiatric/Behavioral: Negative for depression.   Blood pressure 107/62, pulse 68, temperature 98.4 F (36.9 C), temperature source Oral, resp. rate 20, height 5\' 10"  (1.778 m), weight 51.9 kg (114 lb 6.7 oz), SpO2 95.00%. Physical Exam  Constitutional: She is oriented to person, place, and time. She appears well-developed and well-nourished. No distress.  HENT:  Head: Normocephalic and atraumatic.  Eyes: Pupils are equal, round, and reactive to light.  Neck: Neck supple.  Respiratory: Effort normal.  GI: She exhibits no distension.  Musculoskeletal:       Left elbow: She exhibits decreased range of motion and swelling. She exhibits no effusion, no deformity and no laceration. tenderness found.       Arms: Lymphadenopathy:    She has no cervical adenopathy.  Neurological: She is alert and oriented to person, place, and time. She has normal reflexes.  Skin: Skin is warm and dry. She is not diaphoretic. There is erythema.  Psychiatric: She has a normal mood and affect. Her behavior is normal. Judgment and thought content normal.    Assessment/Plan: Resolving left elbow olecranon bursitis   Continue IV and po medication   Add K pad   Will reassess, right now; IV and PO medication should be enough   Fuller Canada 03/11/2011, 10:13 AM

## 2011-03-11 NOTE — Progress Notes (Signed)
Approx 1730 k pad was placed and removed at 1830 approx.

## 2011-03-11 NOTE — Progress Notes (Addendum)
Subjective: Headache improving with treatment of sinusitis. Left arm pain improved but remains.  Objective: Vital signs in last 24 hours: Filed Vitals:   03/09/11 2304 03/10/11 0540 03/10/11 2118 03/11/11 0538  BP: 108/62 103/61 105/61 107/62  Pulse: 65 69 66 68  Temp: 98 F (36.7 C) 97.9 F (36.6 C) 98.2 F (36.8 C) 98.4 F (36.9 C)  TempSrc: Oral Oral Oral Oral  Resp: 20 20 20 20   Height: 5\' 10"  (1.778 m)     Weight: 51.9 kg (114 lb 6.7 oz)     SpO2: 94% 93% 95% 95%   Weight change:   Intake/Output Summary (Last 24 hours) at 03/11/11 0951 Last data filed at 03/11/11 0939  Gross per 24 hour  Intake    240 ml  Output   1850 ml  Net  -1610 ml   Physical Exam: Gen.: Alert oriented and comfortable HEENT: No sinus tenderness. Normocephalic atraumatic. No nasal drainage. Lungs clear to auscultation bilaterally without wheeze rhonchi or rales Cardiovascular regular rate rhythm without murmurs gallops rubs Abdomen soft nontender nondistended Extremities left arm with erythema and induration and tenderness proximal improved on the posterior aspect but still some erythema medially. Still with evidence of bursitis/effusion with most of the erythema around the elbow. Range of motion decreased to about 145 due to pain  Lab Results: Basic Metabolic Panel:  Lab 03/11/11 1610 03/10/11 0645  NA 135 140  K 3.6 3.2*  CL 103 106  CO2 26 27  GLUCOSE 99 106*  BUN 7 7  CREATININE 0.41* 0.46*  CALCIUM 8.9 8.8  MG -- --  PHOS -- --   Liver Function Tests:  Lab 03/10/11 0645  AST 11  ALT 9  ALKPHOS 57  BILITOT 0.4  PROT 5.5*  ALBUMIN 3.0*   No results found for this basename: LIPASE:2,AMYLASE:2 in the last 168 hours No results found for this basename: AMMONIA:2 in the last 168 hours CBC:  Lab 03/10/11 0645 03/09/11 2002  WBC 24.5* 33.9*  NEUTROABS -- 8.1*  HGB 11.6* 13.2  HCT 35.8* 40.1  MCV 96.2 95.7  PLT 108* 120*   Micro Results: Recent Results (from the past  240 hour(s))  CULTURE, BLOOD (ROUTINE X 2)     Status: Normal (Preliminary result)   Collection Time   03/09/11 11:17 PM      Component Value Range Status Comment   Specimen Description BLOOD BLOOD RIGHT HAND   Final    Special Requests BOTTLES DRAWN AEROBIC AND ANAEROBIC 9CC   Final    Culture NO GROWTH 2 DAYS   Final    Report Status PENDING   Incomplete   CULTURE, BLOOD (ROUTINE X 2)     Status: Normal (Preliminary result)   Collection Time   03/09/11 11:23 PM      Component Value Range Status Comment   Specimen Description BLOOD BLOOD LEFT ARM   Final    Special Requests BOTTLES DRAWN AEROBIC AND ANAEROBIC 7CC   Final    Culture NO GROWTH 2 DAYS   Final    Report Status PENDING   Incomplete    Studies/Results: Dg Elbow Complete Left  03/09/2011  *RADIOLOGY REPORT*  Clinical Data: Left elbow pain, swelling and redness.  LEFT ELBOW - COMPLETE 3+ VIEW  Comparison: The  Findings: There is soft tissue swelling over the olecranon.  No definite joint effusion.  No fracture.  IMPRESSION: Soft tissue swelling over the olecranon is indicative of olecranon bursitis.  Original Report Authenticated By:  Reyes Ivan, M.D.   Scheduled Meds:    . docusate sodium  100 mg Oral BID  . ibuprofen  400 mg Oral TID WC  . influenza  inactive virus vaccine  0.5 mL Intramuscular Tomorrow-1000  . levofloxacin  500 mg Oral Daily  . nicotine  14 mg Transdermal Daily  . oxymetazoline  2 spray Each Nare BID  . pseudoephedrine  120 mg Oral BID  . vancomycin  1,000 mg Intravenous Q24H  . DISCONTD: pseudoephedrine  120 mg Oral BID  . DISCONTD: pseudoephedrine  60 mg Oral TID WC & HS   Continuous Infusions:    . sodium chloride 20 mL/hr (03/10/11 1353)   PRN Meds:.acetaminophen, acetaminophen, albuterol, morphine, ondansetron (ZOFRAN) IV, ondansetron, oxyCODONE, senna, traZODone Assessment/Plan: Principal Problem:  *Cellulitis of elbow Active Problems:  Acute frontal sinusitis  Bursitis of  elbow  CLL (chronic lymphocytic leukemia)  Tobacco abuse  headache most likely secondary to sinusitis.  Headache and cellulitis improving but not yet stable for discharge. Would continue IV vancomycin for another 24-48 hours.. Still has evidence of bursitis. Await orthopedic evaluation.   LOS: 2 days   Sheri Ayala 03/11/2011, 9:51 AM

## 2011-03-12 ENCOUNTER — Telehealth: Payer: Self-pay | Admitting: Orthopedic Surgery

## 2011-03-12 MED ORDER — IBUPROFEN 400 MG PO TABS: 400.0000 mg | ORAL_TABLET | Freq: Three times a day (TID) | ORAL | Status: AC

## 2011-03-12 MED ORDER — ACETAMINOPHEN 325 MG PO TABS: 650.0000 mg | ORAL_TABLET | Freq: Four times a day (QID) | ORAL | Status: AC | PRN

## 2011-03-12 MED ORDER — HYDROCODONE-ACETAMINOPHEN 5-500 MG PO CAPS: 1.0000 | ORAL_CAPSULE | Freq: Four times a day (QID) | ORAL | Status: AC | PRN

## 2011-03-12 MED ORDER — DOXYCYCLINE HYCLATE 100 MG PO TABS: 100.0000 mg | ORAL_TABLET | Freq: Two times a day (BID) | ORAL | Status: AC

## 2011-03-12 NOTE — Discharge Summary (Signed)
Physician Discharge Summary  Patient ID: Sheri Ayala MRN: [id number] DOB/AGE: 1951-01-31 60 y.o.  Admit date: 03/09/2011 Discharge date: 03/12/2011  Discharge Diagnoses:  Principal Problem:  *Cellulitis of elbow Active Problems:  Acute frontal sinusitis  Bursitis of elbow  CLL (chronic lymphocytic leukemia)  Tobacco abuse   Current Discharge Medication List    START taking these medications   Details  acetaminophen (TYLENOL) 325 MG tablet Take 2 tablets (650 mg total) by mouth every 6 (six) hours as needed (or Fever >/= 101). Qty: 30 tablet    doxycycline (VIBRA-TABS) 100 MG tablet Take 1 tablet (100 mg total) by mouth 2 (two) times daily. Qty: 14 tablet, Refills: 0    hydrocodone-acetaminophen (LORCET-HD) 5-500 MG per capsule Take 1 capsule by mouth every 6 (six) hours as needed for pain. Qty: 20 capsule, Refills: 0    ibuprofen (ADVIL,MOTRIN) 400 MG tablet Take 1 tablet (400 mg total) by mouth 3 (three) times daily with meals. For 3-4 days Qty: 30 tablet      CONTINUE these medications which have NOT CHANGED   Details  albuterol (PROVENTIL,VENTOLIN) 90 MCG/ACT inhaler Inhale 2 puffs into the lungs every 6 (six) hours as needed. Asthma Symptoms     Aspirin-Acetaminophen-Caffeine (GOODY HEADACHE PO) Take 1 packet by mouth daily as needed. Headaches       STOP taking these medications     oxymetazoline (NASAL) 0.05 % nasal spray        Discharge Orders    Future Orders Please Complete By Expires   Diet general      Discharge instructions      Comments:   Quit smoking   Activity as tolerated - No restrictions      Driving Restrictions      Comments:   No driving while on pain medication     Follow-up Information    Follow up with DAVIS,JEROME. (If symptoms worsen)    Contact information:   [address] [place] [address] [phone]       Follow up with Fuller Canada, MD in 1 week.   Contact information:   [address]  [address] [place] [address] [phone]         Disposition: Home or Self Care  Discharged Condition: stable  Consults: Treatment Team:  Fuller Canada, MD  Labs:   Results for orders placed during the hospital encounter of 03/09/11 (from the past 48 hour(s))  BASIC METABOLIC PANEL     Status: Abnormal   Collection Time   03/11/11  6:50 AM      Component Value Range Comment   Sodium 135  135 - 145 (mEq/L)    Potassium 3.6  3.5 - 5.1 (mEq/L)    Chloride 103  96 - 112 (mEq/L)    CO2 26  19 - 32 (mEq/L)    Glucose, Bld 99  70 - 99 (mg/dL)    BUN 7  6 - 23 (mg/dL)    Creatinine, Ser 5.28 (*) 0.50 - 1.10 (mg/dL)    Calcium 8.9  8.4 - 10.5 (mg/dL)    GFR calc non Af Amer >90  >90 (mL/min)    GFR calc Af Amer >90  >90 (mL/min)     Diagnostics:  Dg Elbow Complete Left  03/09/2011  *RADIOLOGY REPORT*  Clinical Data: Left elbow pain, swelling and redness.  LEFT ELBOW - COMPLETE 3+ VIEW  Comparison: The  Findings: There is soft tissue swelling over the  olecranon.  No definite joint effusion.  No fracture.  IMPRESSION: Soft tissue swelling over the olecranon is indicative of olecranon bursitis.  Original Report Authenticated By: Reyes Ivan, M.D.   Full Code   Hospital Course: See H&P for complete admission details.  The patient presented with left elbow pain, swelling and redness for 2 days.  She also had chills.  On exam, afebrile, and normal vital signs.  Left arm with cellulitis proximal and distal to elbow, with olecranon bursa swelling.  She was started on vancomycin with significant improvement.  Dr. Romeo Apple was consulted and agreed with management.  No need for joint aspiration.  At the time of discharge, the cellulitis was nearly resolved, except over the olecranon bursa.  She also complained of right frontal sinus pain, congestion for 3 weeks.  Also bilateral maxillary sinus pain.  She was started on levaquin, decongestants with  good results.  She will be discharged on doxycycline, which will cover both infections.  Close f/u with ortho.  Discharge Exam:  Blood pressure 116/63, pulse 72, temperature 97.9 F (36.6 C), temperature source Oral, resp. rate 20, height 5\' 10"  (1.778 m), weight 51.9 kg (114 lb 6.7 oz), SpO2 92.00%.  Cellulitis nearly resolved except just over elbow.   SignedChristiane Ha 03/12/2011, 11:50 AM

## 2011-03-12 NOTE — Progress Notes (Signed)
Prior to lunch I spoke with Dr. Romeo Apple and he stated that it was okay for the patient to be discharged from his stand point.  Pt should follow up in his office in 1 week.  Text paged Dr. Lendell Caprice to let her know this.

## 2011-03-12 NOTE — Progress Notes (Signed)
Pt has been given d/c instructions and prescriptions and she verbalizes understanding.  She is ready to be transferred from the floor in stable condition. With staff via w/c

## 2011-03-12 NOTE — Telephone Encounter (Signed)
Dr. Lendell Caprice, direct ph# [phone], called regarding discharging this patient with anti-biotics and have her follow up here in the office -- states had consulted with Dr. Romeo Apple.  Please advise.  She is also paging Dr. Romeo Apple, aware he is in surgery.

## 2011-03-13 NOTE — Telephone Encounter (Signed)
This had been addressed 03/12/11 by Dr. Romeo Apple and a hospital follow up appointment here has been scheduled.

## 2011-03-14 LAB — CULTURE, BLOOD (ROUTINE X 2)

## 2011-03-19 ENCOUNTER — Encounter: Payer: Self-pay | Admitting: Orthopedic Surgery

## 2011-03-19 ENCOUNTER — Ambulatory Visit: Payer: Self-pay | Admitting: Orthopedic Surgery

## 2011-03-19 ENCOUNTER — Telehealth: Payer: Self-pay | Admitting: Orthopedic Surgery

## 2011-03-19 NOTE — Telephone Encounter (Signed)
Patient had called in this morning to confirm her post op#1 appointment for today (I had left a message on cell phone Friday + also called [facility], spoke with Marchelle Folks, who confirmed that patient had been discharged home.  Patient stated during conversation that the "thing on her elbow had popped, as Dr. Romeo Apple said it might do." She said she applied some alcohol and some clean guaze.  I relayed the importance of the appointment today, and recommended she come with someone if possible.   Patient never came to the appointment.  We tried the cell # on file, and it is not the correct #,  We contacted her son, who will relay the message for her to call to re-schedule.

## 2011-03-20 NOTE — Telephone Encounter (Signed)
Letter sent to patient re: re-scheduling her missed post-op appointment.

## 2011-03-25 ENCOUNTER — Emergency Department (HOSPITAL_COMMUNITY)
Admission: EM | Admit: 2011-03-25 | Discharge: 2011-03-25 | Discharge status: Against Medical Advice | Payer: Self-pay | Attending: Emergency Medicine | Admitting: Emergency Medicine

## 2011-03-25 ENCOUNTER — Encounter (HOSPITAL_COMMUNITY): Payer: Self-pay

## 2011-03-25 DIAGNOSIS — H938X9 Other specified disorders of ear, unspecified ear: Secondary | ICD-10-CM | POA: Insufficient documentation

## 2011-03-25 DIAGNOSIS — L0291 Cutaneous abscess, unspecified: Secondary | ICD-10-CM | POA: Insufficient documentation

## 2011-03-25 DIAGNOSIS — L0390 Cellulitis, unspecified: Secondary | ICD-10-CM | POA: Insufficient documentation

## 2011-03-25 NOTE — ED Notes (Signed)
Pt presents with bilateral ear redness and swelling. Pt states symptoms started today.

## 2011-03-26 ENCOUNTER — Encounter (HOSPITAL_COMMUNITY): Payer: Self-pay

## 2011-03-26 ENCOUNTER — Emergency Department (HOSPITAL_COMMUNITY)
Admission: EM | Admit: 2011-03-26 | Discharge: 2011-03-26 | Discharge status: Against Medical Advice | Payer: Self-pay | Attending: Emergency Medicine | Admitting: Emergency Medicine

## 2011-03-26 ENCOUNTER — Emergency Department (HOSPITAL_COMMUNITY): Payer: Self-pay

## 2011-03-26 DIAGNOSIS — Z9079 Acquired absence of other genital organ(s): Secondary | ICD-10-CM | POA: Insufficient documentation

## 2011-03-26 DIAGNOSIS — C9111 Chronic lymphocytic leukemia of B-cell type in remission: Secondary | ICD-10-CM | POA: Insufficient documentation

## 2011-03-26 DIAGNOSIS — C9110 Chronic lymphocytic leukemia of B-cell type not having achieved remission: Secondary | ICD-10-CM

## 2011-03-26 DIAGNOSIS — R000 Tachycardia, unspecified: Secondary | ICD-10-CM | POA: Insufficient documentation

## 2011-03-26 DIAGNOSIS — H6010 Cellulitis of external ear, unspecified ear: Secondary | ICD-10-CM

## 2011-03-26 DIAGNOSIS — H60399 Other infective otitis externa, unspecified ear: Secondary | ICD-10-CM | POA: Insufficient documentation

## 2011-03-26 LAB — BASIC METABOLIC PANEL
BUN: 9 mg/dL (ref 6–23)
CO2: 33 mEq/L — ABNORMAL HIGH (ref 19–32)
Chloride: 102 mEq/L (ref 96–112)
Creatinine, Ser: 0.46 mg/dL — ABNORMAL LOW (ref 0.50–1.10)
GFR calc Af Amer: >90 mL/min (ref >90)
Potassium: 4.1 mEq/L (ref 3.5–5.1)

## 2011-03-26 LAB — CBC
HCT: 42.6 % (ref 36.0–46.0)
MCV: 96.8 fL (ref 78.0–100.0)
RBC: 4.4 MIL/uL (ref 3.87–5.11)
WBC: 26 10*3/uL — ABNORMAL HIGH (ref 4.0–10.5)

## 2011-03-26 MED ORDER — IBUPROFEN 800 MG PO TABS
800.0000 mg | ORAL_TABLET | Freq: Once | ORAL | Status: AC
  Administered 2011-03-26: 800 mg via ORAL
  Filled 2011-03-26: qty 1

## 2011-03-26 MED ORDER — HYDROCODONE-ACETAMINOPHEN 5-325 MG PO TABS
2.0000 | ORAL_TABLET | Freq: Once | ORAL | Status: AC
  Administered 2011-03-26: 2 via ORAL
  Filled 2011-03-26: qty 2

## 2011-03-26 MED ORDER — SODIUM CHLORIDE 0.9 % IV SOLN: Freq: Once | INTRAVENOUS | Status: DC

## 2011-03-26 NOTE — ED Provider Notes (Signed)
8:38 PM Pt reports her CLL is in remission, she is not on active treatment Has bilateral ear swelling with left mastoid tenderness Would recommend CT mastoids and ENT consult  Joya Gaskins, MD 03/26/11 2040

## 2011-03-26 NOTE — ED Provider Notes (Signed)
History     CSN: [id number] Arrival date & time: 03/26/2011  6:46 PM   First MD Initiated Contact with Patient 03/26/11 1910      Chief Complaint  Patient presents with  . Facial Swelling    (Consider location/radiation/quality/duration/timing/severity/associated sxs/prior treatment) HPI Comments: Patient presents with complaint of pain and swelling of both ears. Patient states she has chronic lymphocytic leukemia. She has recently been admitted to the hospital or infection involving the left elbow. This has mostly resolved, but within the last 5 days she has noted increased swelling of both ears. She feels there is a change in her hearing and describes it as feeling as though" I fell in a bucket". Patient denies drainage from the ears. She has not had high fever that she has measured at home. She is not head injury or trauma to the ears. She also complains of headache that she thinks may be related to the ear problem. Patient request evaluation and treatment of this problem.  The history is provided by the patient.    Past Medical History  Diagnosis Date  . Headache   . Pleurisy   . Bronchospasm   . Chronic lymphocytic leukemia   . Pneumonia   . Leukemia     Past Surgical History  Procedure Date  . Abdominal hysterectomy     Family History  Problem Relation Age of Onset  . Aneurysm Mother     History  Substance Use Topics  . Smoking status: Current Everyday Smoker -- 1.0 packs/day    Types: Cigarettes  . Smokeless tobacco: Not on file  . Alcohol Use: No    OB History    Grav Para Term Preterm Abortions TAB SAB Ect Mult Living                  Review of Systems  Constitutional: Negative for activity change.       All ROS Neg except as noted in HPI  HENT: Positive for ear pain and tinnitus. Negative for nosebleeds and neck pain.   Eyes: Negative for photophobia and discharge.  Respiratory: Negative for cough, shortness of breath and wheezing.     Cardiovascular: Negative for chest pain and palpitations.  Gastrointestinal: Negative for abdominal pain and blood in stool.  Genitourinary: Negative for dysuria, frequency and hematuria.  Musculoskeletal: Negative for back pain and arthralgias.  Skin: Positive for wound.  Neurological: Positive for headaches. Negative for dizziness, seizures and speech difficulty.  Psychiatric/Behavioral: Negative for hallucinations and confusion.    Allergies  Penicillins  Home Medications   Current Outpatient Rx  Name Route Sig Dispense Refill  . GOODY HEADACHE PO Oral Take 1 packet by mouth daily as needed. Headaches       BP 137/58  Pulse 113  Temp(Src) 100.8 F (38.2 C) (Oral)  Resp 20  Ht 5\' 10"  (1.778 m)  Wt 120 lb (54.432 kg)  BMI 17.22 kg/m2  SpO2 95%  Physical Exam  Nursing note and vitals reviewed. Constitutional: She is oriented to person, place, and time. She appears well-developed and well-nourished.  Non-toxic appearance.  HENT:  Head: Normocephalic.  Right Ear: There is swelling and tenderness. No drainage.  Left Ear: There is swelling and tenderness. No drainage.       There is increased redness of both external ears. The external auditory canals are partially swollen bilaterally. The tympanic membrane is inappropriately seen, but the portion the same is within normal limits. Pain to movement of the external ear.  Mastoid area pain with point tenderness on the right.  Eyes: EOM and lids are normal. Pupils are equal, round, and reactive to light.  Neck: Normal range of motion. Neck supple. Carotid bruit is not present.  Cardiovascular: Regular rhythm, normal heart sounds, intact distal pulses and normal pulses.  Tachycardia present.   Pulmonary/Chest: Breath sounds normal. No respiratory distress.  Abdominal: Soft. Bowel sounds are normal. There is no tenderness. There is no guarding.  Musculoskeletal: Normal range of motion.  Lymphadenopathy:       Head (right side): No  submandibular adenopathy present.       Head (left side): No submandibular adenopathy present.    She has no cervical adenopathy.  Neurological: She is alert and oriented to person, place, and time. She has normal strength. No cranial nerve deficit or sensory deficit.  Skin: Skin is warm and dry.  Psychiatric: She has a normal mood and affect. Her speech is normal.    ED Course  Procedures (including critical care time)  Labs Reviewed  CBC - Abnormal; Notable for the following:    WBC 26.0 (*)    Platelets 111 (*)    All other components within normal limits  BASIC METABOLIC PANEL - Abnormal; Notable for the following:    CO2 33 (*)    Glucose, Bld 100 (*)    Creatinine, Ser 0.46 (*)    All other components within normal limits   pulse oximetry 96% on room air. Within normal limits by my interpretation. No results found.   Dx: 1 bilateral external ear infection 2. chronic lymphocytic leukemia    MDM  Test results discussed with the patient. The need for CT scan to r/o mastoid involvement was discussed with patient. Pt seen with me by Dr Bebe Shaggy. Pt moved to the acute side.  9:20 nurse reports side rales up an pt not in room. 10:02 nursing reports pt is still not in room. Suspect  Leaving AMA.        Kathie Dike, [place] 03/29/11 [id number]

## 2011-03-26 NOTE — ED Notes (Signed)
Bilateral swelling and redness both ears, very painful

## 2011-03-26 NOTE — ED Notes (Signed)
Pt still not in bed, Pt not in restroom nor is she in CT scan.

## 2011-03-26 NOTE — ED Notes (Signed)
Pt not in room. Side rails up on bed.

## 2011-03-26 NOTE — ED Notes (Signed)
C/o bil ear swelling. Ears red with swelling. Pt states " I feel like Im down in a bucket" when asked about hearing. Denies any injury to ears or ear rings that are new.

## 2011-03-26 NOTE — ED Notes (Signed)
Still unable to locate pt.

## 2011-03-27 ENCOUNTER — Inpatient Hospital Stay (HOSPITAL_COMMUNITY)
Admission: EM | Admit: 2011-03-27 | Discharge: 2011-03-29 | DRG: 155 | Disposition: A | Discharge status: Home / Self Care | Payer: Self-pay | Attending: Internal Medicine | Admitting: Internal Medicine

## 2011-03-27 ENCOUNTER — Emergency Department (HOSPITAL_COMMUNITY): Payer: Self-pay

## 2011-03-27 ENCOUNTER — Encounter (HOSPITAL_COMMUNITY): Payer: Self-pay

## 2011-03-27 DIAGNOSIS — D6959 Other secondary thrombocytopenia: Secondary | ICD-10-CM | POA: Diagnosis present

## 2011-03-27 DIAGNOSIS — L03119 Cellulitis of unspecified part of limb: Secondary | ICD-10-CM

## 2011-03-27 DIAGNOSIS — C9110 Chronic lymphocytic leukemia of B-cell type not having achieved remission: Secondary | ICD-10-CM | POA: Diagnosis present

## 2011-03-27 DIAGNOSIS — Z720 Tobacco use: Secondary | ICD-10-CM

## 2011-03-27 DIAGNOSIS — H60399 Other infective otitis externa, unspecified ear: Principal | ICD-10-CM | POA: Diagnosis present

## 2011-03-27 DIAGNOSIS — F17200 Nicotine dependence, unspecified, uncomplicated: Secondary | ICD-10-CM | POA: Diagnosis present

## 2011-03-27 DIAGNOSIS — J9801 Acute bronchospasm: Secondary | ICD-10-CM | POA: Diagnosis present

## 2011-03-27 DIAGNOSIS — D696 Thrombocytopenia, unspecified: Secondary | ICD-10-CM | POA: Diagnosis present

## 2011-03-27 DIAGNOSIS — R51 Headache: Secondary | ICD-10-CM | POA: Diagnosis present

## 2011-03-27 DIAGNOSIS — L03211 Cellulitis of face: Secondary | ICD-10-CM

## 2011-03-27 DIAGNOSIS — H6013 Cellulitis of external ear, bilateral: Secondary | ICD-10-CM | POA: Diagnosis present

## 2011-03-27 DIAGNOSIS — D649 Anemia, unspecified: Secondary | ICD-10-CM | POA: Diagnosis present

## 2011-03-27 DIAGNOSIS — J0110 Acute frontal sinusitis, unspecified: Secondary | ICD-10-CM

## 2011-03-27 DIAGNOSIS — F17210 Nicotine dependence, cigarettes, uncomplicated: Secondary | ICD-10-CM | POA: Diagnosis present

## 2011-03-27 DIAGNOSIS — M7030 Other bursitis of elbow, unspecified elbow: Secondary | ICD-10-CM

## 2011-03-27 HISTORY — DX: Olecranon bursitis, left elbow: M70.22

## 2011-03-27 HISTORY — DX: Chronic frontal sinusitis: J32.1

## 2011-03-27 HISTORY — DX: Tobacco use: Z72.0

## 2011-03-27 HISTORY — DX: Thrombocytopenia, unspecified: D69.6

## 2011-03-27 LAB — BASIC METABOLIC PANEL
BUN: 10 mg/dL (ref 6–23)
CO2: 32 mEq/L (ref 19–32)
Chloride: 98 mEq/L (ref 96–112)
GFR calc non Af Amer: >90 mL/min (ref >90)
Glucose, Bld: 98 mg/dL (ref 70–99)
Potassium: 4.2 mEq/L (ref 3.5–5.1)

## 2011-03-27 LAB — CBC
HCT: 43.5 % (ref 36.0–46.0)
Hemoglobin: 14 g/dL (ref 12.0–15.0)
MCH: 31.1 pg (ref 26.0–34.0)
MCHC: 32.2 g/dL (ref 30.0–36.0)
MCV: 96.7 fL (ref 78.0–100.0)

## 2011-03-27 LAB — DIFFERENTIAL
Basophils Relative: 0 % (ref 0–1)
Eosinophils Absolute: 0 10*3/uL (ref 0.0–0.7)
Eosinophils Relative: 0 % (ref 0–5)
Lymphs Abs: 17.5 10*3/uL — ABNORMAL HIGH (ref 0.7–4.0)
Monocytes Absolute: 0.3 10*3/uL (ref 0.1–1.0)
Neutro Abs: 7.7 10*3/uL (ref 1.7–7.7)

## 2011-03-27 MED ORDER — MORPHINE SULFATE 4 MG/ML IJ SOLN
4.0000 mg | Freq: Once | INTRAMUSCULAR | Status: AC
  Administered 2011-03-27: 4 mg via INTRAVENOUS
  Filled 2011-03-27: qty 1

## 2011-03-27 MED ORDER — SODIUM CHLORIDE 0.9 % IV SOLN
Freq: Once | INTRAVENOUS | Status: AC
  Administered 2011-03-27: 15:00:00 via INTRAVENOUS

## 2011-03-27 MED ORDER — ALBUTEROL SULFATE (5 MG/ML) 0.5% IN NEBU
2.5000 mg | INHALATION_SOLUTION | RESPIRATORY_TRACT | Status: DC | PRN
  Administered 2011-03-28: 2.5 mg via RESPIRATORY_TRACT
  Filled 2011-03-27: qty 0.5

## 2011-03-27 MED ORDER — NICOTINE 21 MG/24HR TD PT24
21.0000 mg | MEDICATED_PATCH | Freq: Every day | TRANSDERMAL | Status: DC
  Administered 2011-03-27 – 2011-03-29 (×3): 21 mg via TRANSDERMAL
  Filled 2011-03-27 (×2): qty 1

## 2011-03-27 MED ORDER — ALUM & MAG HYDROXIDE-SIMETH 200-200-20 MG/5ML PO SUSP: 30.0000 mL | Freq: Four times a day (QID) | ORAL | Status: DC | PRN

## 2011-03-27 MED ORDER — ONDANSETRON HCL 4 MG/2ML IJ SOLN
4.0000 mg | Freq: Once | INTRAMUSCULAR | Status: AC
  Administered 2011-03-27: 4 mg via INTRAVENOUS
  Filled 2011-03-27: qty 2

## 2011-03-27 MED ORDER — GUAIFENESIN-DM 100-10 MG/5ML PO SYRP: 5.0000 mL | ORAL_SOLUTION | ORAL | Status: DC | PRN

## 2011-03-27 MED ORDER — VANCOMYCIN HCL IN DEXTROSE 1-5 GM/200ML-% IV SOLN
1000.0000 mg | Freq: Once | INTRAVENOUS | Status: AC
  Administered 2011-03-27: 1000 mg via INTRAVENOUS
  Filled 2011-03-27: qty 200

## 2011-03-27 MED ORDER — VANCOMYCIN HCL IN DEXTROSE 1-5 GM/200ML-% IV SOLN: 1000.0000 mg | INTRAVENOUS | Status: DC

## 2011-03-27 MED ORDER — ACETAMINOPHEN 325 MG PO TABS
650.0000 mg | ORAL_TABLET | Freq: Four times a day (QID) | ORAL | Status: DC | PRN
  Administered 2011-03-28: 650 mg via ORAL
  Filled 2011-03-27: qty 2

## 2011-03-27 MED ORDER — HYDROCODONE-ACETAMINOPHEN 5-325 MG PO TABS
1.0000 | ORAL_TABLET | ORAL | Status: DC | PRN
  Administered 2011-03-27 – 2011-03-28 (×5): 2 via ORAL
  Filled 2011-03-27 (×6): qty 2

## 2011-03-27 MED ORDER — ONDANSETRON HCL 4 MG/2ML IJ SOLN: 4.0000 mg | Freq: Four times a day (QID) | INTRAMUSCULAR | Status: DC | PRN

## 2011-03-27 MED ORDER — TRAZODONE HCL 50 MG PO TABS
25.0000 mg | ORAL_TABLET | Freq: Every evening | ORAL | Status: DC | PRN
  Administered 2011-03-28: 25 mg via ORAL
  Filled 2011-03-27 (×2): qty 1

## 2011-03-27 MED ORDER — NICOTINE 14 MG/24HR TD PT24
14.0000 mg | MEDICATED_PATCH | Freq: Every day | TRANSDERMAL | Status: DC
  Filled 2011-03-27: qty 1

## 2011-03-27 MED ORDER — ACETAMINOPHEN 650 MG RE SUPP
650.0000 mg | Freq: Four times a day (QID) | RECTAL | Status: DC | PRN
  Filled 2011-03-27: qty 1

## 2011-03-27 MED ORDER — DOCUSATE SODIUM 100 MG PO CAPS
100.0000 mg | ORAL_CAPSULE | Freq: Two times a day (BID) | ORAL | Status: DC
  Administered 2011-03-27 – 2011-03-29 (×4): 100 mg via ORAL
  Filled 2011-03-27 (×8): qty 1

## 2011-03-27 MED ORDER — POTASSIUM CHLORIDE IN NACL 20-0.9 MEQ/L-% IV SOLN
INTRAVENOUS | Status: DC
  Administered 2011-03-27 – 2011-03-28 (×2): via INTRAVENOUS

## 2011-03-27 MED ORDER — THERA M PLUS PO TABS
1.0000 | ORAL_TABLET | Freq: Every day | ORAL | Status: DC
  Administered 2011-03-28 – 2011-03-29 (×2): 1 via ORAL
  Filled 2011-03-27 (×2): qty 1

## 2011-03-27 MED ORDER — ONDANSETRON HCL 4 MG PO TABS
4.0000 mg | ORAL_TABLET | Freq: Four times a day (QID) | ORAL | Status: DC | PRN
  Administered 2011-03-29: 4 mg via ORAL
  Filled 2011-03-27: qty 1

## 2011-03-27 MED ORDER — SODIUM CHLORIDE 0.9 % IJ SOLN
INTRAMUSCULAR | Status: AC
  Filled 2011-03-27: qty 3

## 2011-03-27 MED ORDER — LEVOFLOXACIN IN D5W 500 MG/100ML IV SOLN
500.0000 mg | INTRAVENOUS | Status: DC
  Administered 2011-03-28: 500 mg via INTRAVENOUS
  Filled 2011-03-27 (×2): qty 100

## 2011-03-27 MED ORDER — MORPHINE SULFATE 2 MG/ML IJ SOLN
4.0000 mg | INTRAMUSCULAR | Status: DC | PRN
  Administered 2011-03-27 – 2011-03-28 (×2): 2 mg via INTRAVENOUS
  Filled 2011-03-27 (×2): qty 1

## 2011-03-27 MED ORDER — BIOTENE DRY MOUTH MT LIQD
15.0000 mL | Freq: Two times a day (BID) | OROMUCOSAL | Status: DC
  Administered 2011-03-27 – 2011-03-29 (×4): 15 mL via OROMUCOSAL

## 2011-03-27 NOTE — ED Provider Notes (Addendum)
History     CSN: [id number] Arrival date & time: 03/27/2011  1:52 PM   First MD Initiated Contact with Patient 03/27/11 1407      Chief Complaint  Patient presents with  . Cellulitis    (Consider location/radiation/quality/duration/timing/severity/associated sxs/prior treatment) HPI  Patient relates she was recently admitted to the hospital for cellulitis of her left arm and shows me she had olecranon bursitis of her left elbow. She relates she just finished her antibiotics last week she relates 2 days ago she woke up with redness and swelling of her left ear. And yesterday she had redness and swelling of her right ear and states the swelling is extending into her scalp and into the left proximal portion of her neck. She relates she has a severe headache and she has had fever up to 101.5. She has nausea or vomiting but denies rhinorrhea or diarrhea. She's had a dry cough. She relates she has chronic lymphocytic leukemia but is not currently being treated.  Primary care physician Dr. Louanna Raw Hematologist  Dr.Sherrill   Past Medical History  Diagnosis Date  . Headache   . Pleurisy   . Bronchospasm   . Chronic lymphocytic leukemia   . Pneumonia   . Thrombocytopenia   . Olecranon bursitis of left elbow November 2012.  . Tobacco abuse   . Frontal sinusitis November 2012    Past Surgical History  Procedure Date  . Abdominal hysterectomy     Family History  Problem Relation Age of Onset  . Aneurysm Mother     History  Substance Use Topics  . Smoking status: Current Everyday Smoker -- 1.0 packs/day    Types: Cigarettes  . Smokeless tobacco: Not on file  . Alcohol Use: No  self-employed  OB History    Grav Para Term Preterm Abortions TAB SAB Ect Mult Living                  Review of Systems  All other systems reviewed and are negative.    Allergies  Penicillins  Home Medications   Current Outpatient Rx  Name Route Sig Dispense Refill  . GOODY  HEADACHE PO Oral Take 1 packet by mouth daily as needed. Headaches       BP 145/79  Pulse 103  Temp(Src) 99.2 F (37.3 C) (Oral)  Resp 22  Ht 5\' 10"  (1.778 m)  Wt 120 lb (54.432 kg)  BMI 17.22 kg/m2  SpO2 95% Vital signs show low-grade temp and tachycardia otherwise normal  Physical Exam  Nursing note and vitals reviewed. Constitutional: She is oriented to person, place, and time.  Non-toxic appearance. She does not appear ill. No distress.       Thin white female who is alert and cooperative  HENT:  Head: Normocephalic.  Nose: Nose normal. No mucosal edema or rhinorrhea.  Mouth/Throat: Oropharynx is clear and moist and mucous membranes are normal. No dental abscesses or uvula swelling.       Patient's noted to have diffuse swelling and redness of the helix of both feet years with swelling and of the canal. She's also noted to have redness and swelling posterior to the ureters and also extending into the scalp near the temples laterally. She also is noted to have some vague redness on the left side of her neck just under her jaw.  Eyes: Conjunctivae and EOM are normal. Pupils are equal, round, and reactive to light.  Neck: Normal range of motion and full passive range of  motion without pain. Neck supple.  Cardiovascular: Normal rate, regular rhythm and normal heart sounds.  Exam reveals no gallop and no friction rub.   No murmur heard. Pulmonary/Chest: Effort normal and breath sounds normal. No respiratory distress. She has no wheezes. She has no rhonchi. She has no rales. She exhibits no tenderness and no crepitus.  Abdominal: Soft. Normal appearance and bowel sounds are normal. She exhibits no distension. There is no tenderness. There is no rebound and no guarding.  Musculoskeletal: Normal range of motion. She exhibits no edema and no tenderness.       Moves all extremities well. Patient still has some mild redness and swelling of her olecranon of her left elbow but she appears to  have no pain on range of motion.  Neurological: She is alert and oriented to person, place, and time. She has normal strength. No cranial nerve deficit.  Skin: Skin is warm, dry and intact. No rash noted. No erythema. No pallor.  Psychiatric: She has a normal mood and affect. Her speech is normal and behavior is normal. Her mood appears not anxious.    ED Course  Procedures (including critical care time)  Pt started on IV vancomycin while waiting for her CT to be done. She was given IV morphine and zofran for her headache.   17:07 Dr Sherrie Mustache here in ED and will admit  Results for orders placed during the hospital encounter of 03/27/11  BASIC METABOLIC PANEL      Component Value Range   Sodium 138  135 - 145 (mEq/L)   Potassium 4.2  3.5 - 5.1 (mEq/L)   Chloride 98  96 - 112 (mEq/L)   CO2 32  19 - 32 (mEq/L)   Glucose, Bld 98  70 - 99 (mg/dL)   BUN 10  6 - 23 (mg/dL)   Creatinine, Ser 1.61  0.50 - 1.10 (mg/dL)   Calcium 9.9  8.4 - 09.6 (mg/dL)   GFR calc non Af Amer >90  >90 (mL/min)   GFR calc Af Amer >90  >90 (mL/min)  CBC      Component Value Range   WBC 25.5 (*) 4.0 - 10.5 (K/uL)   RBC 4.50  3.87 - 5.11 (MIL/uL)   Hemoglobin 14.0  12.0 - 15.0 (g/dL)   HCT 04.5  40.9 - 81.1 (%)   MCV 96.7  78.0 - 100.0 (fL)   MCH 31.1  26.0 - 34.0 (pg)   MCHC 32.2  30.0 - 36.0 (g/dL)   RDW 91.4  78.2 - 95.6 (%)   Platelets 121 (*) 150 - 400 (K/uL)  DIFFERENTIAL      Component Value Range   Neutrophils Relative 30 (*) 43 - 77 (%)   Lymphocytes Relative 69 (*) 12 - 46 (%)   Monocytes Relative 1 (*) 3 - 12 (%)   Eosinophils Relative 0  0 - 5 (%)   Basophils Relative 0  0 - 1 (%)   Neutro Abs 7.7  1.7 - 7.7 (K/uL)   Lymphs Abs 17.5 (*) 0.7 - 4.0 (K/uL)   Monocytes Absolute 0.3  0.1 - 1.0 (K/uL)   Eosinophils Absolute 0.0  0.0 - 0.7 (K/uL)   Basophils Absolute 0.0  0.0 - 0.1 (K/uL)   WBC Morphology WHITE COUNT CONFIRMED ON SMEAR      Laboratory interpretation chronic leukocytosis  consistent with her history of CLL   Dg Elbow Complete Left  03/09/2011  *RADIOLOGY REPORT*  Clinical Data: Left elbow pain, swelling and redness.  LEFT ELBOW - COMPLETE 3+ VIEW  Comparison: The  Findings: There is soft tissue swelling over the olecranon.  No definite joint effusion.  No fracture.  IMPRESSION: Soft tissue swelling over the olecranon is indicative of olecranon bursitis.  Original Report Authenticated By: Reyes Ivan, M.D.   Diagnoses that have been ruled out:  Diagnoses that are still under consideration:  Final diagnoses:  Facial cellulitis  Cellulitis of both external ears    Plan admit  Devoria Albe, MD, FACEP   MDM          Ward Givens, MD 03/27/11 1710  Ward Givens, MD 03/27/11 1733

## 2011-03-27 NOTE — Progress Notes (Signed)
ANTIBIOTIC CONSULT NOTE - INITIAL  Pharmacy Consult for Vancomycin Indication: Cellulitis  Allergies  Allergen Reactions  . Penicillins Hives and Nausea And Vomiting    Patient Measurements: Height: 5\' 10"  (177.8 cm) Weight: 113 lb 6.4 oz (51.438 kg) IBW/kg (Calculated) : 68.5    Vital Signs: Temp: 99.4 F (37.4 C) (12/11 1938) Temp src: Oral (12/11 1938) BP: 116/68 mmHg (12/11 1938) Pulse Rate: 92  (12/11 1938) Intake/Output from previous day:   Intake/Output from this shift:    Labs:  [facility] 03/27/11 1447 03/26/11 1952  WBC 25.5* 26.0*  HGB 14.0 13.6  PLT 121* 111*  LABCREA -- --  CREATININE 0.50 0.46*   Estimated Creatinine Clearance: 59.9 ml/min (by C-G formula based on Cr of 0.5). No results found for this basename: VANCOTROUGH:2,VANCOPEAK:2,VANCORANDOM:2,GENTTROUGH:2,GENTPEAK:2,GENTRANDOM:2,TOBRATROUGH:2,TOBRAPEAK:2,TOBRARND:2,AMIKACINPEAK:2,AMIKACINTROU:2,AMIKACIN:2, in the last 72 hours   Microbiology: Recent Results (from the past 720 hour(s))  CULTURE, BLOOD (ROUTINE X 2)     Status: Normal   Collection Time   03/09/11 11:17 PM      Component Value Range Status Comment   Specimen Description BLOOD LEFT ARM   Final    Special Requests BOTTLES DRAWN AEROBIC AND ANAEROBIC 7CC   Final    Culture NO GROWTH 5 DAYS   Final    Report Status 03/14/2011 FINAL   Final   CULTURE, BLOOD (ROUTINE X 2)     Status: Normal   Collection Time   03/09/11 11:23 PM      Component Value Range Status Comment   Specimen Description BLOOD RIGHT HAND   Final    Special Requests BOTTLES DRAWN AEROBIC AND ANAEROBIC 9CC   Final    Culture NO GROWTH 5 DAYS   Final    Report Status 03/14/2011 FINAL   Final     Medical History: Past Medical History  Diagnosis Date  . Headache   . Pleurisy   . Bronchospasm   . Chronic lymphocytic leukemia   . Pneumonia   . Thrombocytopenia   . Olecranon bursitis of left elbow November 2012.  . Tobacco abuse   . Frontal sinusitis  November 2012    Medications:  Scheduled:    . sodium chloride   Intravenous Once  . docusate sodium  100 mg Oral BID  . levofloxacin (LEVAQUIN) IV  500 mg Intravenous Q24H  .  morphine injection  4 mg Intravenous Once  . multivitamins ther. w/minerals  1 tablet Oral Daily  . nicotine  14 mg Transdermal Daily  . ondansetron (ZOFRAN) IV  4 mg Intravenous Once  . vancomycin  1,000 mg Intravenous Once   Assessment: Ok for protocol  Goal of Therapy:  Vancomycin trough level 10-15 mcg/ml  Plan:  Vancomycin 1 GM IV every 24 hours, first dose in ER 03/27/11 4 PM Vancomycin level at steady state Monitor renal function  Raquel James, Teniola Tseng Bennett 03/27/2011,7:57 PM

## 2011-03-27 NOTE — ED Notes (Signed)
Patient provided hot dinner tray

## 2011-03-27 NOTE — H&P (Signed)
Sheri Ayala MRN: [id number] DOB/AGE: December 22, 1950 60 y.o. Primary Care Physician:DAVIS,JEROME, MD Admit date: 03/27/2011 Chief Complaint: Bilateral ear pain and headache. HPI: The patient is a 60 year old woman with a past medical history significant for left olecranon bursitis last month and chronic lymphocytic leukemia, who presents to the emergency department today with a chief complaint of swelling, redness, and pain in both of her ears. She began having swelling and pain in her left ear 2 days ago and then in her right ear yesterday. She does not have specific in her ear pain, rather the pain is on the outside around her earlobes. The pain is now radiating to behind her ears down to her neck. She also has some redness extending from her ears to her temples. She has had no ear drainage. She has had no recent new piercing of her ears. She denies using Q-tips. She tries to use the speaker phone option on her cell phone, but she does acknowledge that she puts her cell phone to both ears at times. She has had a right frontal headache, but no dizziness or visual changes. She denies. Or swollen eyes. She denies a sore throat. She did have nausea and vomiting on 2 occasions, but she denies abdominal pain and pain with urination. She also had a fever of 101.5 yesterday. She has had chills as well. Of note, she did finish doxycycline approximately one week ago for continued treatment of left olecranon bursitis.  In the emergency department, she is noted to be mildly febrile with a temperature of 100.8. She is mildly tachycardic. Her white blood cell count is 25.5. The CT scan of her maxillofacial area reveals no acute findings. She is being admitted for further evaluation and management.    Past Medical History  Diagnosis Date  . Headache   . Pleurisy   . Bronchospasm   . Chronic lymphocytic leukemia   . Pneumonia   . Thrombocytopenia   . Olecranon bursitis of left elbow November 2012.  . Tobacco abuse    . Frontal sinusitis November 2012    Past Surgical History  Procedure Date  . Abdominal hysterectomy     Prior to Admission medications   Medication Sig Start Date End Date Taking? Authorizing Provider  Aspirin-Acetaminophen-Caffeine (GOODY HEADACHE PO) Take 1 packet by mouth daily as needed. Headaches    Yes Historical Provider, MD    Allergies:  Allergies  Allergen Reactions  . Penicillins Hives and Nausea And Vomiting    Family History  Problem Relation Age of Onset  . Aneurysm Mother   Her mother died of complications of a brain and cardiac aneurysm. Her father is still alive and he has hypertension, coronary artery disease, and prostate cancer.  Social History: She smokes a half a pack of cigarettes per day, down from 1-2 packs of cigarettes a day. She's been smoking for more than 40 years. She denies illicit drug use and alcohol use. She owns a [employer]. She is divorced. She has 2 grown children.    ROS: As above in history of present illness. Otherwise negative.  PHYSICAL EXAM: Blood pressure 119/63, pulse 101, temperature 99.2 F (37.3 C), temperature source Oral, resp. rate 17, height 5\' 10"  (1.778 m), weight 54.432 kg (120 lb), SpO2 91.00%. General: Pleasant alert 60 year old Caucasian woman who is currently in no acute distress. HEENT: Head is normocephalic, nontraumatic. There is mild erythema extending from her left ear to her left temple. Pupils are equal, round, and reactive to light.  Conjunctivae are clear and sclerae are white. Extraocular movements are intact. Nasal mucosa is mildly dry with no sinus tenderness. The helices on both ears are erythematous and edematous and moderately tender. No active drainage. Tympanic membrane on the right difficult to visualize however the tympanic membrane on the left is clear. Could not detect appreciable erythema in the external canals on both ears. No active drainage. Oropharynx reveals a full set of dentures. No  posterior exudates or erythema. Neck: Supple, mild shotty adenopathy palpated behind the left ear, but no other palpable lymphadenopathy. No bruit, no JVD, no thyromegaly. Lungs: Occasional wheezes auscultated, breathing is nonlabored. Heart: S1, S2, with mild tachycardia. Abdomen: Positive bowel sounds, soft, nontender, nondistended. No hepatosplenomegaly. Extremities: Pedal pulses palpable bilaterally. No pretibial edema and no pedal edema. Left elbow with mild residual erythema and edema, no warmth, no tenderness. Good range of motion. Neurologic: She is alert and oriented x3. Cranial nerves II through XII are intact. Strength is 5 over 5 throughout. Sensation is intact.  Basic Metabolic Panel:  Basename 03/27/11 1447 03/26/11 1952  NA 138 141  K 4.2 4.1  CL 98 102  CO2 32 33*  GLUCOSE 98 100*  BUN 10 9  CREATININE 0.50 0.46*  CALCIUM 9.9 9.6  MG -- --  PHOS -- --   Liver Function Tests: No results found for this basename: AST:2,ALT:2,ALKPHOS:2,BILITOT:2,PROT:2,ALBUMIN:2 in the last 72 hours No results found for this basename: LIPASE:2,AMYLASE:2 in the last 72 hours No results found for this basename: AMMONIA:2 in the last 72 hours CBC:  Basename 03/27/11 1447 03/26/11 1952  WBC 25.5* 26.0*  NEUTROABS 7.7 --  HGB 14.0 13.6  HCT 43.5 42.6  MCV 96.7 96.8  PLT 121* 111*   Cardiac Enzymes: No results found for this basename: CKTOTAL:3,CKMB:3,CKMBINDEX:3,TROPONINI:3 in the last 72 hours BNP: No components found with this basename: POCBNP:3 D-Dimer: No results found for this basename: DDIMER:2 in the last 72 hours CBG: No results found for this basename: GLUCAP:6 in the last 72 hours Hemoglobin A1C: No results found for this basename: HGBA1C in the last 72 hours Fasting Lipid Panel: No results found for this basename: CHOL,HDL,LDLCALC,TRIG,CHOLHDL,LDLDIRECT in the last 72 hours Thyroid Function Tests: No results found for this basename:  TSH,T4TOTAL,FREET4,T3FREE,THYROIDAB in the last 72 hours Anemia Panel: No results found for this basename: VITAMINB12,FOLATE,FERRITIN,TIBC,IRON,RETICCTPCT in the last 72 hours Coagulation: No results found for this basename: LABPROT:2,INR:2 in the last 72 hours Urine Drug Screen: Drugs of Abuse  No results found for this basename: labopia, cocainscrnur, labbenz, amphetmu, thcu, labbarb    Alcohol Level: No results found for this basename: ETH:2 in the last 72 hours      No results found for this or any previous visit (from the past 240 hour(s)).   Results for orders placed during the hospital encounter of 03/27/11 (from the past 48 hour(s))  BASIC METABOLIC PANEL     Status: Normal   Collection Time   03/27/11  2:47 PM      Component Value Range Comment   Sodium 138  135 - 145 (mEq/L)    Potassium 4.2  3.5 - 5.1 (mEq/L)    Chloride 98  96 - 112 (mEq/L)    CO2 32  19 - 32 (mEq/L)    Glucose, Bld 98  70 - 99 (mg/dL)    BUN 10  6 - 23 (mg/dL)    Creatinine, Ser 1.61  0.50 - 1.10 (mg/dL)    Calcium 9.9  8.4 - 10.5 (mg/dL)  GFR calc non Af Amer >90  >90 (mL/min)    GFR calc Af Amer >90  >90 (mL/min)   CBC     Status: Abnormal   Collection Time   03/27/11  2:47 PM      Component Value Range Comment   WBC 25.5 (*) 4.0 - 10.5 (K/uL)    RBC 4.50  3.87 - 5.11 (MIL/uL)    Hemoglobin 14.0  12.0 - 15.0 (g/dL)    HCT 40.9  81.1 - 91.4 (%)    MCV 96.7  78.0 - 100.0 (fL)    MCH 31.1  26.0 - 34.0 (pg)    MCHC 32.2  30.0 - 36.0 (g/dL)    RDW 78.2  95.6 - 21.3 (%)    Platelets 121 (*) 150 - 400 (K/uL)   DIFFERENTIAL     Status: Abnormal   Collection Time   03/27/11  2:47 PM      Component Value Range Comment   Neutrophils Relative 30 (*) 43 - 77 (%)    Lymphocytes Relative 69 (*) 12 - 46 (%)    Monocytes Relative 1 (*) 3 - 12 (%)    Eosinophils Relative 0  0 - 5 (%)    Basophils Relative 0  0 - 1 (%)    Neutro Abs 7.7  1.7 - 7.7 (K/uL)    Lymphs Abs 17.5 (*) 0.7 - 4.0 (K/uL)      Monocytes Absolute 0.3  0.1 - 1.0 (K/uL)    Eosinophils Absolute 0.0  0.0 - 0.7 (K/uL)    Basophils Absolute 0.0  0.0 - 0.1 (K/uL)    WBC Morphology WHITE COUNT CONFIRMED ON SMEAR   ATYPICAL LYMPHOCYTES    Dg Elbow Complete Left  03/09/2011  *RADIOLOGY REPORT*  Clinical Data: Left elbow pain, swelling and redness.  LEFT ELBOW - COMPLETE 3+ VIEW  Comparison: The  Findings: There is soft tissue swelling over the olecranon.  No definite joint effusion.  No fracture.  IMPRESSION: Soft tissue swelling over the olecranon is indicative of olecranon bursitis.  Original Report Authenticated By: Reyes Ivan, M.D.   Ct Maxillofacial Wo Cm  03/27/2011  *RADIOLOGY REPORT*  Clinical Data: Cellulitis of the ears.  Question mastoiditis. History of leukemia.  CT MAXILLOFACIAL WITHOUT CONTRAST  Technique:  Multidetector CT imaging of the maxillofacial structures was performed. Multiplanar CT image reconstructions were also generated.  Comparison: CT head 12/13/2010  Findings: Mastoid air cells are clear as are the paranasal sinuses. No air fluid levels.  No acute bony abnormality.  Orbital soft tissues are normal.  Mild edema/swelling in the external auditory soft tissues as described clinically.  No fluid collections.  Small preauricular lymph nodes bilaterally, right slightly greater than left.  No acute bony abnormality.  IMPRESSION: Mastoid air cells and paranasal sinuses are clear.  Original Report Authenticated By: Cyndie Chime, M.D.    Impression:  Active Problems:  CLL (chronic lymphocytic leukemia)  Tobacco abuse  Cellulitis of both external ears  Headache in front of head  Thrombocytopenia  1. Cellulitis of both external ears. Etiology is unusual and unclear. Both of her ears are pierced, however, she has not had recent piercing and has not bought any new earrings. Contamination from her cell phone is a possibility. Recent haircut may be a possibility as well. She has dentures, but no  obvious erythema or exudates seen in her oral pharynx.  Recent history and treatment of left olecranon bursitis. She received antibiotics during the hospitalization in  November and finished the ongoing outpatient antibiotics approximately one week ago. The bursitis appears to be almost completely healed.  Chronic lymphocytic leukemia. The patient says her baseline white blood cell count is approximately 22,000. She sees Dr. Truett Perna every year or 2. She made need to see him sooner, because it appears that she has had a trend upward in her WBC and has had 2 recent episodes of cellulitis.  Thrombocytopenia, likely secondary to CLL.  Tobacco abuse. The patient was encouraged to stop.   Plan:  1. We'll start vancomycin and Levaquin intravenously. IV fluid hydration. Analgesics as needed for pain. Tobacco cessation counseling. We'll place a nicotine patch. Will check a TSH and vitamin B 12 level.      Sheri Ayala 03/27/2011, 5:54 PM

## 2011-03-27 NOTE — ED Notes (Signed)
Pt presents with bilateral ear pain, swelling, and redness. Pt was seen here yesterday and CT was ordered but pt left AMA due to "someone pissed me off".

## 2011-03-27 NOTE — ED Notes (Signed)
Patient requesting pain medication. Patient medicated for headache per Dr Theodis Aguas inpatient orders.

## 2011-03-28 LAB — COMPREHENSIVE METABOLIC PANEL
BUN: 11 mg/dL (ref 6–23)
CO2: 31 mEq/L (ref 19–32)
Calcium: 8.6 mg/dL (ref 8.4–10.5)
GFR calc Af Amer: >90 mL/min (ref >90)
GFR calc non Af Amer: >90 mL/min (ref >90)
Glucose, Bld: 91 mg/dL (ref 70–99)
Total Protein: 5.4 g/dL — ABNORMAL LOW (ref 6.0–8.3)

## 2011-03-28 LAB — CBC
HCT: 37.5 % (ref 36.0–46.0)
MCHC: 31.7 g/dL (ref 30.0–36.0)
RDW: 14.9 % (ref 11.5–15.5)

## 2011-03-28 LAB — VITAMIN B12: Vitamin B-12: 309 pg/mL (ref 211–911)

## 2011-03-28 MED ORDER — ALBUTEROL SULFATE HFA 108 (90 BASE) MCG/ACT IN AERS
2.0000 | INHALATION_SPRAY | Freq: Three times a day (TID) | RESPIRATORY_TRACT | Status: DC
  Administered 2011-03-28 – 2011-03-29 (×3): 2 via RESPIRATORY_TRACT
  Filled 2011-03-28: qty 6.7

## 2011-03-28 MED ORDER — OXYCODONE HCL 5 MG PO TABS
5.0000 mg | ORAL_TABLET | ORAL | Status: DC | PRN
Start: 2011-03-28 — End: 2011-03-29
  Administered 2011-03-28 – 2011-03-29 (×5): 5 mg via ORAL
  Filled 2011-03-28 (×5): qty 1

## 2011-03-28 MED ORDER — VANCOMYCIN HCL 1000 MG IV SOLR
750.0000 mg | Freq: Two times a day (BID) | INTRAVENOUS | Status: DC
  Administered 2011-03-28 – 2011-03-29 (×2): 750 mg via INTRAVENOUS
  Filled 2011-03-28 (×5): qty 750

## 2011-03-28 NOTE — Progress Notes (Signed)
Subjective: She complains of a mild headache, but overall better. There is less discomfort of her ears.  Objective: Vital signs in last 24 hours: Filed Vitals:   03/27/11 1927 03/27/11 1938 03/28/11 0552 03/28/11 0801  BP: 116/68 116/68 110/64   Pulse: 92 92 85   Temp: 99.4 F (37.4 C) 99.4 F (37.4 C) 97.5 F (36.4 C)   TempSrc:  Oral    Resp: 20 20 16    Height: 5\' 10"  (1.778 m) 5\' 10"  (1.778 m)    Weight: 51.438 kg (113 lb 6.4 oz) 51.438 kg (113 lb 6.4 oz)    SpO2: 93% 93% 97% 94%    Intake/Output Summary (Last 24 hours) at 03/28/11 1200 Last data filed at 03/28/11 0841  Gross per 24 hour  Intake    944 ml  Output      0 ml  Net    944 ml    Weight change:   Physical exam: Bilateral external ears, with less erythema, less edema, and less tenderness. Less erythema over the left temple. Palpable pulses auricular lymph node palpated on the left. Lungs: Occasional wheezes auscultated bilaterally. Heart: S1, S2, with a soft systolic murmur. Abdomen: Positive bowel sounds, soft, nontender, nondistended. Extremities: No pedal edema.  Lab Results: Basic Metabolic Panel:  Basename 03/28/11 0538 03/27/11 1447  NA 137 138  K 3.9 4.2  CL 101 98  CO2 31 32  GLUCOSE 91 98  BUN 11 10  CREATININE 0.47* 0.50  CALCIUM 8.6 9.9  MG -- --  PHOS -- --   Liver Function Tests:  [facility] 03/28/11 0538  AST 11  ALT 6  ALKPHOS 47  BILITOT 0.3  PROT 5.4*  ALBUMIN 2.9*   No results found for this basename: LIPASE:2,AMYLASE:2 in the last 72 hours No results found for this basename: AMMONIA:2 in the last 72 hours CBC:  Basename 03/28/11 0538 03/27/11 1447  WBC 19.0* 25.5*  NEUTROABS -- 7.7  HGB 11.9* 14.0  HCT 37.5 43.5  MCV 98.2 96.7  PLT 92* 121*   Cardiac Enzymes: No results found for this basename: CKTOTAL:3,CKMB:3,CKMBINDEX:3,TROPONINI:3 in the last 72 hours BNP: No components found with this basename: POCBNP:3 D-Dimer: No results found for this basename:  DDIMER:2 in the last 72 hours CBG: No results found for this basename: GLUCAP:6 in the last 72 hours Hemoglobin A1C: No results found for this basename: HGBA1C in the last 72 hours Fasting Lipid Panel: No results found for this basename: CHOL,HDL,LDLCALC,TRIG,CHOLHDL,LDLDIRECT in the last 72 hours Thyroid Function Tests: No results found for this basename: TSH,T4TOTAL,FREET4,T3FREE,THYROIDAB in the last 72 hours Anemia Panel: No results found for this basename: VITAMINB12,FOLATE,FERRITIN,TIBC,IRON,RETICCTPCT in the last 72 hours Coagulation: No results found for this basename: LABPROT:2,INR:2 in the last 72 hours Urine Drug Screen: Drugs of Abuse  No results found for this basename: labopia, cocainscrnur, labbenz, amphetmu, thcu, labbarb    Alcohol Level: No results found for this basename: ETH:2 in the last 72 hours  Micro: No results found for this or any previous visit (from the past 240 hour(s)).  Studies/Results: Ct Maxillofacial Wo Cm  03/27/2011  *RADIOLOGY REPORT*  Clinical Data: Cellulitis of the ears.  Question mastoiditis. History of leukemia.  CT MAXILLOFACIAL WITHOUT CONTRAST  Technique:  Multidetector CT imaging of the maxillofacial structures was performed. Multiplanar CT image reconstructions were also generated.  Comparison: CT head 12/13/2010  Findings: Mastoid air cells are clear as are the paranasal sinuses. No air fluid levels.  No acute bony abnormality.  Orbital  soft tissues are normal.  Mild edema/swelling in the external auditory soft tissues as described clinically.  No fluid collections.  Small preauricular lymph nodes bilaterally, right slightly greater than left.  No acute bony abnormality.  IMPRESSION: Mastoid air cells and paranasal sinuses are clear.  Original Report Authenticated By: Cyndie Chime, M.D.    Medications: I have reviewed the patient's current medications.  Assessment: Active Problems:  CLL (chronic lymphocytic leukemia)  Tobacco  abuse  Cellulitis of both external ears  Headache in front of head  Thrombocytopenia  Anemia  1. Cellulitis of both external ears. Symptomatic and clinical improvement noted on antibiotics. Would give her another day of IV antibiotics and consider discharging to home tomorrow.  Leukocytosis secondary to CLL and infection. White blood cell count is now less than 20,000.  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 Next  Dilutional anemia.  Headache, likely secondary to infection.  Tobacco abuse. Encouraged patient to quit. On nicotine replacement therapy. She has a few bronchospasms on exam. We'll start albuterol inhaler.  Plan:  Saline lock IV. Continue IV antibiotics for another 24 hours. Consider discharging her to home tomorrow.    LOS: 1 day   Avinash Maltos 03/28/2011, 12:00 PM

## 2011-03-28 NOTE — Progress Notes (Signed)
ANTIBIOTIC CONSULT NOTE -follow up  Pharmacy Consult for Vancomycin  Indication: cellulitis  Allergies  Allergen Reactions  . Penicillins Hives and Nausea And Vomiting   Patient Measurements: Height: 5\' 10"  (177.8 cm) Weight: 113 lb 6.4 oz (51.438 kg) IBW/kg (Calculated) : 68.5   Vital Signs: Temp: 97.5 F (36.4 C) (12/12 0552) BP: 110/64 mmHg (12/12 0552) Pulse Rate: 85  (12/12 0552) Intake/Output from previous day:   Intake/Output from this shift: Total I/O In: 944 [P.O.:50; I.V.:894] Out: -   Labs:  Basename 03/28/11 0538 03/27/11 1447 03/26/11 1952  WBC 19.0* 25.5* 26.0*  HGB 11.9* 14.0 13.6  PLT 92* 121* 111*  LABCREA -- -- --  CREATININE 0.47* 0.50 0.46*   Estimated Creatinine Clearance: 59.9 ml/min (by C-G formula based on Cr of 0.47). No results found for this basename: VANCOTROUGH:2,VANCOPEAK:2,VANCORANDOM:2,GENTTROUGH:2,GENTPEAK:2,GENTRANDOM:2,TOBRATROUGH:2,TOBRAPEAK:2,TOBRARND:2,AMIKACINPEAK:2,AMIKACINTROU:2,AMIKACIN:2, in the last 72 hours   Microbiology: Recent Results (from the past 720 hour(s))  CULTURE, BLOOD (ROUTINE X 2)     Status: Normal   Collection Time   03/09/11 11:17 PM      Component Value Range Status Comment   Specimen Description BLOOD LEFT ARM   Final    Special Requests BOTTLES DRAWN AEROBIC AND ANAEROBIC 7CC   Final    Culture NO GROWTH 5 DAYS   Final    Report Status 03/14/2011 FINAL   Final   CULTURE, BLOOD (ROUTINE X 2)     Status: Normal   Collection Time   03/09/11 11:23 PM      Component Value Range Status Comment   Specimen Description BLOOD RIGHT HAND   Final    Special Requests BOTTLES DRAWN AEROBIC AND ANAEROBIC 9CC   Final    Culture NO GROWTH 5 DAYS   Final    Report Status 03/14/2011 FINAL   Final    Medical History: Past Medical History  Diagnosis Date  . Headache   . Pleurisy   . Bronchospasm   . Chronic lymphocytic leukemia   . Pneumonia   . Thrombocytopenia   . Olecranon bursitis of left elbow November  2012.  . Tobacco abuse   . Frontal sinusitis November 2012   Medications:  Scheduled:     . sodium chloride   Intravenous Once  . antiseptic oral rinse  15 mL Mouth Rinse BID  . docusate sodium  100 mg Oral BID  . levofloxacin (LEVAQUIN) IV  500 mg Intravenous Q24H  .  morphine injection  4 mg Intravenous Once  . multivitamins ther. w/minerals  1 tablet Oral Daily  . nicotine  14 mg Transdermal Daily  . nicotine  21 mg Transdermal Daily  . ondansetron (ZOFRAN) IV  4 mg Intravenous Once  . sodium chloride      . vancomycin  750 mg Intravenous Q12H  . vancomycin  1,000 mg Intravenous Once  . DISCONTD: vancomycin  1,000 mg Intravenous Q24H   Assessment: SCr OK  Goal of Therapy:  Vancomycin trough level 10-15 mcg/ml  Plan: Vancomycin 750mg  iv q12hrs Check trough at steady state. Monitor renal function  Margo Aye, Deserea Bordley A 03/28/2011,10:56 AM

## 2011-03-29 LAB — DIFFERENTIAL
Basophils Absolute: 0 10*3/uL (ref 0.0–0.1)
Eosinophils Absolute: 0.2 10*3/uL (ref 0.0–0.7)
Lymphocytes Relative: 81 % — ABNORMAL HIGH (ref 12–46)
Lymphs Abs: 14.3 10*3/uL — ABNORMAL HIGH (ref 0.7–4.0)
Neutro Abs: 2.8 10*3/uL (ref 1.7–7.7)

## 2011-03-29 LAB — CBC
HCT: 36.7 % (ref 36.0–46.0)
MCHC: 32.7 g/dL (ref 30.0–36.0)
MCV: 96.8 fL (ref 78.0–100.0)
RDW: 14.3 % (ref 11.5–15.5)

## 2011-03-29 MED ORDER — HYDROCODONE-ACETAMINOPHEN 5-325 MG PO TABS: 1.0000 | ORAL_TABLET | Freq: Four times a day (QID) | ORAL | Status: AC | PRN

## 2011-03-29 MED ORDER — DOXYCYCLINE HYCLATE 100 MG PO TABS: 100.0000 mg | ORAL_TABLET | Freq: Two times a day (BID) | ORAL | Status: AC

## 2011-03-29 NOTE — Progress Notes (Signed)
PIV removed without complaint, patient discharged home. Patient verbalizes understanding of discharge instructions and prescriptions. Patient escorted out by staff, transported by family.

## 2011-03-29 NOTE — Discharge Summary (Signed)
Physician Discharge Summary  Patient ID: Sheri Ayala MRN: [id number] DOB/AGE: 08-05-1950 60 y.o.  Admit date: 03/27/2011 Discharge date: 03/29/2011  Discharge Diagnoses:  Principal Problem:  *Cellulitis of both external ears Active Problems:  CLL (chronic lymphocytic leukemia)  Tobacco abuse, counseled against  Headache in front of head  Thrombocytopenia  Anemia   Current Discharge Medication List    START taking these medications   Details  doxycycline (VIBRA-TABS) 100 MG tablet Take 1 tablet (100 mg total) by mouth 2 (two) times daily. Qty: 10 tablet, Refills: 0    HYDROcodone-acetaminophen (NORCO) 5-325 MG per tablet Take 1 tablet by mouth every 6 (six) hours as needed for pain. Qty: 20 tablet, Refills: 0      CONTINUE these medications which have NOT CHANGED   Details  Aspirin-Acetaminophen-Caffeine (GOODY HEADACHE PO) Take 1 packet by mouth daily as needed. Headaches         Discharge Orders    Future Orders Please Complete By Expires   Diet general      Activity as tolerated - No restrictions         Follow-up Information    Follow up with DAVIS,JEROME. (If symptoms worsen)          Disposition: home  Discharged Condition: stable  Consults:  none  Labs:   Results for orders placed during the hospital encounter of 03/27/11 (from the past 48 hour(s))  BASIC METABOLIC PANEL     Status: Normal   Collection Time   03/27/11  2:47 PM      Component Value Range Comment   Sodium 138  135 - 145 (mEq/L)    Potassium 4.2  3.5 - 5.1 (mEq/L)    Chloride 98  96 - 112 (mEq/L)    CO2 32  19 - 32 (mEq/L)    Glucose, Bld 98  70 - 99 (mg/dL)    BUN 10  6 - 23 (mg/dL)    Creatinine, Ser 4.09  0.50 - 1.10 (mg/dL)    Calcium 9.9  8.4 - 10.5 (mg/dL)    GFR calc non Af Amer >90  >90 (mL/min)    GFR calc Af Amer >90  >90 (mL/min)   CBC     Status: Abnormal   Collection Time   03/27/11  2:47 PM      Component Value Range Comment   WBC 25.5 (*) 4.0 - 10.5 (K/uL)      RBC 4.50  3.87 - 5.11 (MIL/uL)    Hemoglobin 14.0  12.0 - 15.0 (g/dL)    HCT 81.1  91.4 - 78.2 (%)    MCV 96.7  78.0 - 100.0 (fL)    MCH 31.1  26.0 - 34.0 (pg)    MCHC 32.2  30.0 - 36.0 (g/dL)    RDW 95.6  21.3 - 08.6 (%)    Platelets 121 (*) 150 - 400 (K/uL)   DIFFERENTIAL     Status: Abnormal   Collection Time   03/27/11  2:47 PM      Component Value Range Comment   Neutrophils Relative 30 (*) 43 - 77 (%)    Lymphocytes Relative 69 (*) 12 - 46 (%)    Monocytes Relative 1 (*) 3 - 12 (%)    Eosinophils Relative 0  0 - 5 (%)    Basophils Relative 0  0 - 1 (%)    Neutro Abs 7.7  1.7 - 7.7 (K/uL)    Lymphs Abs 17.5 (*) 0.7 - 4.0 (K/uL)  Monocytes Absolute 0.3  0.1 - 1.0 (K/uL)    Eosinophils Absolute 0.0  0.0 - 0.7 (K/uL)    Basophils Absolute 0.0  0.0 - 0.1 (K/uL)    WBC Morphology WHITE COUNT CONFIRMED ON SMEAR   ATYPICAL LYMPHOCYTES  COMPREHENSIVE METABOLIC PANEL     Status: Abnormal   Collection Time   03/28/11  5:38 AM      Component Value Range Comment   Sodium 137  135 - 145 (mEq/L)    Potassium 3.9  3.5 - 5.1 (mEq/L)    Chloride 101  96 - 112 (mEq/L)    CO2 31  19 - 32 (mEq/L)    Glucose, Bld 91  70 - 99 (mg/dL)    BUN 11  6 - 23 (mg/dL)    Creatinine, Ser 1.61 (*) 0.50 - 1.10 (mg/dL)    Calcium 8.6  8.4 - 10.5 (mg/dL)    Total Protein 5.4 (*) 6.0 - 8.3 (g/dL)    Albumin 2.9 (*) 3.5 - 5.2 (g/dL)    AST 11  0 - 37 (U/L)    ALT 6  0 - 35 (U/L)    Alkaline Phosphatase 47  39 - 117 (U/L)    Total Bilirubin 0.3  0.3 - 1.2 (mg/dL)    GFR calc non Af Amer >90  >90 (mL/min)    GFR calc Af Amer >90  >90 (mL/min)   CBC     Status: Abnormal   Collection Time   03/28/11  5:38 AM      Component Value Range Comment   WBC 19.0 (*) 4.0 - 10.5 (K/uL)    RBC 3.82 (*) 3.87 - 5.11 (MIL/uL)    Hemoglobin 11.9 (*) 12.0 - 15.0 (g/dL)    HCT 09.6  04.5 - 40.9 (%)    MCV 98.2  78.0 - 100.0 (fL)    MCH 31.2  26.0 - 34.0 (pg)    MCHC 31.7  30.0 - 36.0 (g/dL)    RDW 81.1  91.4 -  78.2 (%)    Platelets 92 (*) 150 - 400 (K/uL) DELTA CHECK NOTED  TSH     Status: Normal   Collection Time   03/28/11  5:38 AM      Component Value Range Comment   TSH 0.833  0.350 - 4.500 (uIU/mL)   VITAMIN B12     Status: Normal   Collection Time   03/28/11  5:38 AM      Component Value Range Comment   Vitamin B-12 309  211 - 911 (pg/mL)   CBC     Status: Abnormal   Collection Time   03/29/11  5:22 AM      Component Value Range Comment   WBC 17.7 (*) 4.0 - 10.5 (K/uL)    RBC 3.79 (*) 3.87 - 5.11 (MIL/uL)    Hemoglobin 12.0  12.0 - 15.0 (g/dL)    HCT 95.6  21.3 - 08.6 (%)    MCV 96.8  78.0 - 100.0 (fL)    MCH 31.7  26.0 - 34.0 (pg)    MCHC 32.7  30.0 - 36.0 (g/dL)    RDW 57.8  46.9 - 62.9 (%)    Platelets 92 (*) 150 - 400 (K/uL)   DIFFERENTIAL     Status: Abnormal   Collection Time   03/29/11  5:22 AM      Component Value Range Comment   Neutrophils Relative 16 (*) 43 - 77 (%)    Lymphocytes Relative 81 (*) 12 -  46 (%)    Monocytes Relative 2 (*) 3 - 12 (%)    Eosinophils Relative 1  0 - 5 (%)    Basophils Relative 0  0 - 1 (%)    Neutro Abs 2.8  1.7 - 7.7 (K/uL)    Lymphs Abs 14.3 (*) 0.7 - 4.0 (K/uL)    Monocytes Absolute 0.4  0.1 - 1.0 (K/uL)    Eosinophils Absolute 0.2  0.0 - 0.7 (K/uL)    Basophils Absolute 0.0  0.0 - 0.1 (K/uL)     Diagnostics:  Dg Elbow Complete Left  03/09/2011  *RADIOLOGY REPORT*  Clinical Data: Left elbow pain, swelling and redness.  LEFT ELBOW - COMPLETE 3+ VIEW  Comparison: The  Findings: There is soft tissue swelling over the olecranon.  No definite joint effusion.  No fracture.  IMPRESSION: Soft tissue swelling over the olecranon is indicative of olecranon bursitis.  Original Report Authenticated By: Reyes Ivan, M.D.   Ct Maxillofacial Wo Cm  03/27/2011  *RADIOLOGY REPORT*  Clinical Data: Cellulitis of the ears.  Question mastoiditis. History of leukemia.  CT MAXILLOFACIAL WITHOUT CONTRAST  Technique:  Multidetector CT imaging of  the maxillofacial structures was performed. Multiplanar CT image reconstructions were also generated.  Comparison: CT head 12/13/2010  Findings: Mastoid air cells are clear as are the paranasal sinuses. No air fluid levels.  No acute bony abnormality.  Orbital soft tissues are normal.  Mild edema/swelling in the external auditory soft tissues as described clinically.  No fluid collections.  Small preauricular lymph nodes bilaterally, right slightly greater than left.  No acute bony abnormality.  IMPRESSION: Mastoid air cells and paranasal sinuses are clear.  Original Report Authenticated By: Cyndie Chime, M.D.    Procedures: none  Full Code   Hospital Course: See H&P for complete admission details. The patient is a 60 year old white female with CLL who presented with bilateral ear pain and swelling. She apparently had evidence of otitis she was febrile. She is chronic leukocytosis related to the CLL. She was started on vancomycin and levofloxacin IV. Her cellulitis improved quickly and by the time of discharge was nearly resolved except for some slight erythema of the right ear.  Discharge Exam:  Blood pressure 126/69, pulse 70, temperature 98.2 F (36.8 C), temperature source Oral, resp. rate 16, height 5\' 10"  (1.778 m), weight 51.438 kg (113 lb 6.4 oz), SpO2 85.00%.  Right pinna slightly erythematous. No induration. Left pinna normal   Signed: Zamarian Scarano L 03/29/2011, 11:34 AM

## 2011-03-29 NOTE — ED Provider Notes (Signed)
Medical screening examination/treatment/procedure(s) were conducted as a shared visit with non-physician practitioner(s) and myself.  I personally evaluated the patient during the encounter   Joya Gaskins, MD 03/29/11 [phone]

## 2011-04-28 ENCOUNTER — Encounter (HOSPITAL_COMMUNITY): Payer: Self-pay | Admitting: Emergency Medicine

## 2011-04-28 ENCOUNTER — Emergency Department (HOSPITAL_COMMUNITY): Payer: Self-pay

## 2011-04-28 ENCOUNTER — Emergency Department (HOSPITAL_COMMUNITY)
Admission: EM | Admit: 2011-04-28 | Discharge: 2011-04-28 | Disposition: A | Discharge status: Home / Self Care | Payer: Self-pay | Attending: Emergency Medicine | Admitting: Emergency Medicine

## 2011-04-28 ENCOUNTER — Other Ambulatory Visit: Payer: Self-pay

## 2011-04-28 DIAGNOSIS — J449 Chronic obstructive pulmonary disease, unspecified: Secondary | ICD-10-CM | POA: Insufficient documentation

## 2011-04-28 DIAGNOSIS — J189 Pneumonia, unspecified organism: Secondary | ICD-10-CM | POA: Insufficient documentation

## 2011-04-28 DIAGNOSIS — I498 Other specified cardiac arrhythmias: Secondary | ICD-10-CM | POA: Insufficient documentation

## 2011-04-28 DIAGNOSIS — D72829 Elevated white blood cell count, unspecified: Secondary | ICD-10-CM | POA: Insufficient documentation

## 2011-04-28 DIAGNOSIS — Z87891 Personal history of nicotine dependence: Secondary | ICD-10-CM | POA: Insufficient documentation

## 2011-04-28 DIAGNOSIS — Z9079 Acquired absence of other genital organ(s): Secondary | ICD-10-CM | POA: Insufficient documentation

## 2011-04-28 DIAGNOSIS — C9210 Chronic myeloid leukemia, BCR/ABL-positive, not having achieved remission: Secondary | ICD-10-CM | POA: Insufficient documentation

## 2011-04-28 DIAGNOSIS — M7020 Olecranon bursitis, unspecified elbow: Secondary | ICD-10-CM | POA: Insufficient documentation

## 2011-04-28 DIAGNOSIS — J4489 Other specified chronic obstructive pulmonary disease: Secondary | ICD-10-CM | POA: Insufficient documentation

## 2011-04-28 HISTORY — DX: Chronic obstructive pulmonary disease, unspecified: J44.9

## 2011-04-28 LAB — BASIC METABOLIC PANEL
BUN: 14 mg/dL (ref 6–23)
CO2: 31 mEq/L (ref 19–32)
Calcium: 9.4 mg/dL (ref 8.4–10.5)
Chloride: 97 mEq/L (ref 96–112)
Creatinine, Ser: 0.53 mg/dL (ref 0.50–1.10)
GFR calc Af Amer: >90 mL/min (ref >90)
GFR calc non Af Amer: >90 mL/min (ref >90)
Glucose, Bld: 133 mg/dL — ABNORMAL HIGH (ref 70–99)
Potassium: 3.5 mEq/L (ref 3.5–5.1)
Sodium: 135 mEq/L (ref 135–145)

## 2011-04-28 LAB — CBC
HCT: 42.1 % (ref 36.0–46.0)
Hemoglobin: 13.5 g/dL (ref 12.0–15.0)
MCH: 30.5 pg (ref 26.0–34.0)
MCHC: 32.1 g/dL (ref 30.0–36.0)
MCV: 95.2 fL (ref 78.0–100.0)
Platelets: 196 10*3/uL (ref 150–400)
RBC: 4.42 MIL/uL (ref 3.87–5.11)
RDW: 13.7 % (ref 11.5–15.5)
WBC: 42.4 10*3/uL — ABNORMAL HIGH (ref 4.0–10.5)

## 2011-04-28 LAB — POCT I-STAT TROPONIN I: Troponin i, poc: 0.03 ng/mL (ref 0.00–0.08)

## 2011-04-28 MED ORDER — LEVOFLOXACIN 500 MG PO TABS: 500.0000 mg | ORAL_TABLET | Freq: Every day | ORAL | Status: AC

## 2011-04-28 MED ORDER — SODIUM CHLORIDE 0.9 % IV BOLUS (SEPSIS)
1000.0000 mL | Freq: Once | INTRAVENOUS | Status: AC
  Administered 2011-04-28: 1000 mL via INTRAVENOUS

## 2011-04-28 MED ORDER — ALBUTEROL SULFATE (5 MG/ML) 0.5% IN NEBU
5.0000 mg | INHALATION_SOLUTION | Freq: Once | RESPIRATORY_TRACT | Status: AC
  Administered 2011-04-28: 5 mg via RESPIRATORY_TRACT
  Filled 2011-04-28: qty 1

## 2011-04-28 MED ORDER — LEVOFLOXACIN IN D5W 500 MG/100ML IV SOLN
500.0000 mg | Freq: Once | INTRAVENOUS | Status: AC
  Administered 2011-04-28: 500 mg via INTRAVENOUS
  Filled 2011-04-28: qty 100

## 2011-04-28 MED ORDER — ASPIRIN 81 MG PO CHEW
324.0000 mg | CHEWABLE_TABLET | Freq: Once | ORAL | Status: AC
  Administered 2011-04-28: 324 mg via ORAL
  Filled 2011-04-28: qty 4

## 2011-04-28 MED ORDER — IPRATROPIUM BROMIDE 0.02 % IN SOLN
0.5000 mg | Freq: Once | RESPIRATORY_TRACT | Status: AC
  Administered 2011-04-28: 0.5 mg via RESPIRATORY_TRACT
  Filled 2011-04-28: qty 2.5

## 2011-04-28 NOTE — ED Notes (Signed)
Drink given to pt. 

## 2011-04-28 NOTE — ED Notes (Signed)
Patient c/o left sided chest pain that radiates down left side. Patient reports dizziness, shortness of breath, nausea and vomiting x2 days. Patient also reports hx of chronic lymphoma -but states she has been in remission x6 years.

## 2011-04-28 NOTE — ED Provider Notes (Signed)
History  Scribed for Raeford Razor, MD, the patient was seen in [facility]. The chart was scribed by Gilman Schmidt. The patients care was started at 2:41 PM.   CSN: [id number]  Arrival date & time 04/28/11  1405   First MD Initiated Contact with Patient 04/28/11 1429      Chief Complaint  Patient presents with  . Chest Pain    (Consider location/radiation/quality/duration/timing/severity/associated sxs/prior treatment) HPI Bayla I Mccurley is a 61 y.o. female who presents to the Emergency Department complaining of chest pain onset three days. Pain is described as a pins and needle sensation.States she "could not breathe today". Symptoms are exacerbated upon laying on side. Pt has never had similar symptoms. Also notes SOB, nausea, and vomiting. Denies any history of heart or lung problems.Smoker.  Denies any swelling in extremities. There are no other associated symptoms and no other alleviating or aggravating factors.  Past Medical History  Diagnosis Date  . Headache   . Pleurisy   . Bronchospasm   . Chronic lymphocytic leukemia   . Pneumonia   . Thrombocytopenia   . Olecranon bursitis of left elbow November 2012.  . Tobacco abuse   . Frontal sinusitis November 2012  . COPD (chronic obstructive pulmonary disease)     Past Surgical History  Procedure Date  . Abdominal hysterectomy     Family History  Problem Relation Age of Onset  . Aneurysm Mother     History  Substance Use Topics  . Smoking status: Former Smoker -- 1.0 packs/day for 45 years    Types: Cigarettes    Quit date: 04/24/2011  . Smokeless tobacco: Never Used  . Alcohol Use: No    OB History    Grav Para Term Preterm Abortions TAB SAB Ect Mult Living   2 2 2       2       Review of Systems  Respiratory: Positive for shortness of breath.   Cardiovascular: Positive for chest pain.  Gastrointestinal: Positive for nausea and vomiting.  Neurological: Positive for dizziness.  All other systems reviewed and  are negative.    Allergies  Penicillins  Home Medications   Current Outpatient Rx  Name Route Sig Dispense Refill  . GOODY HEADACHE PO Oral Take 1 packet by mouth daily as needed. Headaches       BP 128/67  Pulse 115  Temp(Src) 98.8 F (37.1 C) (Oral)  Resp 24  Ht 5\' 10"  (1.778 m)  Wt 115 lb (52.164 kg)  BMI 16.50 kg/m2  SpO2 95%  Physical Exam  Constitutional: She is oriented to person, place, and time. She appears well-developed and well-nourished.  Non-toxic appearance. She does not have a sickly appearance.  HENT:  Head: Normocephalic and atraumatic.  Eyes: Conjunctivae, EOM and lids are normal. Pupils are equal, round, and reactive to light. No scleral icterus.  Neck: Trachea normal and normal range of motion. Neck supple.  Cardiovascular: Regular rhythm and normal heart sounds.  Tachycardia present.   Pulmonary/Chest: Effort normal and breath sounds normal.       Expository wheezes bilaterally Mild tachypnea   Abdominal: Soft. Normal appearance. There is no tenderness. There is no rebound, no guarding and no CVA tenderness.  Musculoskeletal: Normal range of motion.  Neurological: She is alert and oriented to person, place, and time. She has normal strength.  Skin: Skin is warm, dry and intact. No rash noted.    ED Course  Procedures (including critical care time)  Labs Reviewed  CBC -  Abnormal; Notable for the following:    WBC 42.4 (*) WHITE COUNT CONFIRMED ON SMEAR   All other components within normal limits  BASIC METABOLIC PANEL - Abnormal; Notable for the following:    Glucose, Bld 133 (*)    All other components within normal limits  POCT I-STAT TROPONIN I  I-STAT TROPONIN I   Dg Chest 2 View  04/28/2011  *RADIOLOGY REPORT*  Clinical Data: Cough, congestion and fever.  CHEST - 2 VIEW  Comparison: Plain film chest 12/13/2010.  Findings: Patchy airspace disease is present throughout the right lung, worst in the right lower and middle lobes, consistent  with pneumonia.  Left lung appears clear.  Chest is hyperexpanded. Heart size is normal. Small right effusion is noted.  No pneumothorax or left effusion.  IMPRESSION:  1.  Patchy airspace disease throughout the right lung, worst in the right middle and lower lobes consistent with pneumonia. 2.  Emphysema.  Original Report Authenticated By: Bernadene Bell. D'ALESSIO, M.D.   EKG:  Rhythm: sinus tachycardia Rate: 110 Axis: normal Intervals/Conduction: normal. Do not agree with computer reading of 1st degree av block ST segments: normal   1. CAP (community acquired pneumonia)     DIAGNOSTIC STUDIES: Oxygen Saturation is 95% on room air, normal by my interpretation.    COORDINATION OF CARE: 2:41pm:  - Patient evaluated by ED physician, ASA, DG Chest, CBC, BMP, I-stat ordered     MDM  60yf with CP and dyspnea. CXR and clinical picture consistent with CAP. Leokocytosis noted. Pt with hx of CLL. Pt persistently tachy and mild hypoxia. Recommended admission but pt declining at this time. Will tx with levaquin given PCN allergy. Strict return precautions discussed. Close outpt fu.   I personally preformed the services scribed in my presence. The recorded information has been reviewed and considered. Raeford Razor, MD.      Raeford Razor, MD 04/28/11 [phone]

## 2011-05-02 NOTE — Telephone Encounter (Signed)
Patient had received letter at the time of the no show.  Had also made follow up call.  No additional response from patient.

## 2012-12-22 ENCOUNTER — Emergency Department (HOSPITAL_COMMUNITY): Payer: Self-pay

## 2012-12-22 ENCOUNTER — Encounter (HOSPITAL_COMMUNITY): Payer: Self-pay | Admitting: Nurse Practitioner

## 2012-12-22 ENCOUNTER — Inpatient Hospital Stay (HOSPITAL_COMMUNITY)
Admission: EM | Admit: 2012-12-22 | Discharge: 2012-12-25 | DRG: 190 | Disposition: A | Discharge status: Home / Self Care | Payer: Self-pay | Attending: Internal Medicine | Admitting: Internal Medicine

## 2012-12-22 DIAGNOSIS — Z9981 Dependence on supplemental oxygen: Secondary | ICD-10-CM

## 2012-12-22 DIAGNOSIS — I6789 Other cerebrovascular disease: Secondary | ICD-10-CM | POA: Diagnosis present

## 2012-12-22 DIAGNOSIS — R0602 Shortness of breath: Secondary | ICD-10-CM

## 2012-12-22 DIAGNOSIS — L03119 Cellulitis of unspecified part of limb: Secondary | ICD-10-CM

## 2012-12-22 DIAGNOSIS — F17200 Nicotine dependence, unspecified, uncomplicated: Secondary | ICD-10-CM | POA: Diagnosis present

## 2012-12-22 DIAGNOSIS — D696 Thrombocytopenia, unspecified: Secondary | ICD-10-CM

## 2012-12-22 DIAGNOSIS — H532 Diplopia: Secondary | ICD-10-CM | POA: Diagnosis present

## 2012-12-22 DIAGNOSIS — H6013 Cellulitis of external ear, bilateral: Secondary | ICD-10-CM

## 2012-12-22 DIAGNOSIS — Z720 Tobacco use: Secondary | ICD-10-CM

## 2012-12-22 DIAGNOSIS — R519 Headache, unspecified: Secondary | ICD-10-CM

## 2012-12-22 DIAGNOSIS — J441 Chronic obstructive pulmonary disease with (acute) exacerbation: Principal | ICD-10-CM

## 2012-12-22 DIAGNOSIS — D6859 Other primary thrombophilia: Secondary | ICD-10-CM | POA: Diagnosis present

## 2012-12-22 DIAGNOSIS — J0110 Acute frontal sinusitis, unspecified: Secondary | ICD-10-CM

## 2012-12-22 DIAGNOSIS — C9110 Chronic lymphocytic leukemia of B-cell type not having achieved remission: Secondary | ICD-10-CM

## 2012-12-22 DIAGNOSIS — G43909 Migraine, unspecified, not intractable, without status migrainosus: Secondary | ICD-10-CM | POA: Diagnosis present

## 2012-12-22 DIAGNOSIS — Z681 Body mass index (BMI) 19 or less, adult: Secondary | ICD-10-CM

## 2012-12-22 DIAGNOSIS — D649 Anemia, unspecified: Secondary | ICD-10-CM

## 2012-12-22 DIAGNOSIS — J189 Pneumonia, unspecified organism: Secondary | ICD-10-CM

## 2012-12-22 DIAGNOSIS — Z79899 Other long term (current) drug therapy: Secondary | ICD-10-CM

## 2012-12-22 DIAGNOSIS — R42 Dizziness and giddiness: Secondary | ICD-10-CM

## 2012-12-22 LAB — BASIC METABOLIC PANEL
BUN: 18 mg/dL (ref 6–23)
Chloride: 102 mEq/L (ref 96–112)
Creatinine, Ser: 0.57 mg/dL (ref 0.50–1.10)
GFR calc Af Amer: >90 mL/min (ref >90)
GFR calc non Af Amer: >90 mL/min (ref >90)
Potassium: 4.4 mEq/L (ref 3.5–5.1)

## 2012-12-22 LAB — CBC
HCT: 49 % — ABNORMAL HIGH (ref 36.0–46.0)
MCHC: 31.4 g/dL (ref 30.0–36.0)
Platelets: 174 10*3/uL (ref 150–400)
RDW: 14.9 % (ref 11.5–15.5)
WBC: 45.9 10*3/uL — ABNORMAL HIGH (ref 4.0–10.5)

## 2012-12-22 LAB — POCT I-STAT TROPONIN I: Troponin i, poc: 0.02 ng/mL (ref 0.00–0.08)

## 2012-12-22 MED ORDER — LEVALBUTEROL HCL 0.63 MG/3ML IN NEBU: 0.6300 mg | INHALATION_SOLUTION | Freq: Four times a day (QID) | RESPIRATORY_TRACT | Status: DC | PRN

## 2012-12-22 MED ORDER — ONDANSETRON HCL 4 MG PO TABS
4.0000 mg | ORAL_TABLET | Freq: Four times a day (QID) | ORAL | Status: DC | PRN
  Administered 2012-12-23: 4 mg via ORAL
  Filled 2012-12-22: qty 1

## 2012-12-22 MED ORDER — ENOXAPARIN SODIUM 40 MG/0.4ML ~~LOC~~ SOLN
40.0000 mg | SUBCUTANEOUS | Status: DC
  Administered 2012-12-23 – 2012-12-24 (×3): 40 mg via SUBCUTANEOUS
  Filled 2012-12-22 (×4): qty 0.4

## 2012-12-22 MED ORDER — IPRATROPIUM BROMIDE 0.02 % IN SOLN
0.5000 mg | Freq: Four times a day (QID) | RESPIRATORY_TRACT | Status: DC
  Administered 2012-12-23 – 2012-12-25 (×8): 0.5 mg via RESPIRATORY_TRACT
  Filled 2012-12-22 (×11): qty 2.5

## 2012-12-22 MED ORDER — BUDESONIDE 0.25 MG/2ML IN SUSP
0.2500 mg | Freq: Two times a day (BID) | RESPIRATORY_TRACT | Status: DC
  Administered 2012-12-23 – 2012-12-25 (×5): 0.25 mg via RESPIRATORY_TRACT
  Filled 2012-12-22 (×8): qty 2

## 2012-12-22 MED ORDER — LEVALBUTEROL HCL 0.63 MG/3ML IN NEBU
0.6300 mg | INHALATION_SOLUTION | Freq: Four times a day (QID) | RESPIRATORY_TRACT | Status: DC
  Administered 2012-12-23 – 2012-12-25 (×8): 0.63 mg via RESPIRATORY_TRACT
  Filled 2012-12-22 (×21): qty 3

## 2012-12-22 MED ORDER — SODIUM CHLORIDE 0.9 % IJ SOLN
3.0000 mL | Freq: Two times a day (BID) | INTRAMUSCULAR | Status: DC
  Administered 2012-12-23 – 2012-12-24 (×3): 3 mL via INTRAVENOUS

## 2012-12-22 MED ORDER — ONDANSETRON HCL 4 MG/2ML IJ SOLN: 4.0000 mg | Freq: Three times a day (TID) | INTRAMUSCULAR | Status: AC | PRN

## 2012-12-22 MED ORDER — LEVOFLOXACIN IN D5W 750 MG/150ML IV SOLN
750.0000 mg | Freq: Once | INTRAVENOUS | Status: AC
  Administered 2012-12-22: 750 mg via INTRAVENOUS
  Filled 2012-12-22: qty 150

## 2012-12-22 MED ORDER — ACETAMINOPHEN 650 MG RE SUPP: 650.0000 mg | Freq: Four times a day (QID) | RECTAL | Status: DC | PRN

## 2012-12-22 MED ORDER — ONDANSETRON HCL 4 MG/2ML IJ SOLN
4.0000 mg | Freq: Four times a day (QID) | INTRAMUSCULAR | Status: DC | PRN
  Filled 2012-12-22: qty 2

## 2012-12-22 MED ORDER — LEVOFLOXACIN IN D5W 750 MG/150ML IV SOLN
750.0000 mg | INTRAVENOUS | Status: DC
  Administered 2012-12-23: 750 mg via INTRAVENOUS
  Filled 2012-12-22 (×2): qty 150

## 2012-12-22 MED ORDER — SODIUM CHLORIDE 0.9 % IV SOLN
INTRAVENOUS | Status: DC
  Administered 2012-12-23 (×2): via INTRAVENOUS

## 2012-12-22 MED ORDER — ACETAMINOPHEN 325 MG PO TABS
650.0000 mg | ORAL_TABLET | Freq: Four times a day (QID) | ORAL | Status: DC | PRN
  Administered 2012-12-22 – 2012-12-24 (×4): 650 mg via ORAL
  Filled 2012-12-22 (×4): qty 2

## 2012-12-22 NOTE — ED Notes (Signed)
Pt reports since yesterday she has been dizzy, has to hold onto something while she walks. Also has been nauseated and shaky.

## 2012-12-22 NOTE — ED Provider Notes (Signed)
CSN: [id number]     Arrival date & time 12/22/12  1503 History   First MD Initiated Contact with Patient 12/22/12 1607     Chief Complaint  Patient presents with  . Dizziness   (Consider location/radiation/quality/duration/timing/severity/associated sxs/prior Treatment) HPI Comments: Patient is a 62 year old female with a past medical history of leukemia who presents with a 2 day history of dizziness. Patient reports waking up 2 mornings ago and noticing she felt like the room was spinning. The symptoms have been constant since the onset. No aggravating/alleviating factors. Patient reports associated headache and nausea. Patient reports a family history of brain aneurysms and "blood clots in the brain." Patient denies any head trauma or injury.    Past Medical History  Diagnosis Date  . Headache(784.0)   . Pleurisy   . Bronchospasm   . Chronic lymphocytic leukemia   . Pneumonia   . Thrombocytopenia   . Olecranon bursitis of left elbow November 2012.  . Tobacco abuse   . Frontal sinusitis November 2012  . COPD (chronic obstructive pulmonary disease)    Past Surgical History  Procedure Laterality Date  . Abdominal hysterectomy     Family History  Problem Relation Age of Onset  . Aneurysm Mother    History  Substance Use Topics  . Smoking status: Current Every Day Smoker -- 1.00 packs/day for 45 years    Types: Cigarettes    Last Attempt to Quit: 04/24/2011  . Smokeless tobacco: Never Used  . Alcohol Use: No   OB History   Grav Para Term Preterm Abortions TAB SAB Ect Mult Living   2 2 2       2      Review of Systems  Gastrointestinal: Positive for nausea.  Neurological: Positive for dizziness and headaches.  All other systems reviewed and are negative.    Allergies  Penicillins  Home Medications   Current Outpatient Rx  Name  Route  Sig  Dispense  Refill  . Aspirin-Acetaminophen-Caffeine (GOODY HEADACHE PO)   Oral   Take 2 packets by mouth daily. Headaches          . neomycin-bacitracin-polymyxin (NEOSPORIN) ointment   Topical   Apply 1 application topically daily as needed (for bug bites).           BP 124/76  Pulse 121  Temp(Src) 98.4 F (36.9 C) (Oral)  Resp 16  Ht 5\' 9"  (1.753 m)  Wt 120 lb (54.432 kg)  BMI 17.71 kg/m2  SpO2 91% Physical Exam  Nursing note and vitals reviewed. Constitutional: She is oriented to person, place, and time. She appears well-developed and well-nourished. No distress.  HENT:  Head: Normocephalic and atraumatic.  Mouth/Throat: Oropharynx is clear and moist. No oropharyngeal exudate.  Eyes: Conjunctivae and EOM are normal. Pupils are equal, round, and reactive to light. No scleral icterus.  2 beat nystagmus noted on lateral eye movement.   Neck: Normal range of motion.  Cardiovascular: Normal rate and regular rhythm.  Exam reveals no gallop and no friction rub.   No murmur heard. Pulmonary/Chest: Effort normal and breath sounds normal. She has no wheezes. She has no rales. She exhibits no tenderness.  Abdominal: Soft. She exhibits no distension. There is no tenderness. There is no rebound and no guarding.  Musculoskeletal: Normal range of motion.  Neurological: She is alert and oriented to person, place, and time. No cranial nerve deficit. Coordination normal.  Extremity strength and sensation equal and intact bilaterally. Speech is goal-oriented. Moves limbs  without ataxia.   Skin: Skin is warm and dry.  Psychiatric: She has a normal mood and affect. Her behavior is normal.    ED Course  Procedures (including critical care time) Labs Review Labs Reviewed  CBC - Abnormal; Notable for the following:    WBC 45.9 (*)    Hemoglobin 15.4 (*)    HCT 49.0 (*)    All other components within normal limits  BASIC METABOLIC PANEL - Abnormal; Notable for the following:    Glucose, Bld 117 (*)    All other components within normal limits   Imaging Review Dg Chest 2 View  12/22/2012   *RADIOLOGY REPORT*   Clinical Data: Persistent cough for 2 weeks.  History of pneumonia. History of smoking, COPD.  Fever.  Dizziness.  CHEST - 2 VIEW  Comparison: 04/28/2011  Findings: Lungs are hyperinflated.  There is perihilar peribronchial thickening.  Prominent interstitial markings are favored to be chronic.  There are no focal consolidations or pleural effusions.  There has been some improvement in right lower lobe opacity with persistent density likely representing scarring. No pulmonary edema. Visualized osseous structures have a normal appearance.  IMPRESSION:  1.  COPD and bronchitic changes. 2.  Improvement in right lower lobe opacity, with residual probable scarring.   Original Report Authenticated By: Norva Pavlov, M.D.   Ct Head Wo Contrast  12/22/2012   *RADIOLOGY REPORT*  Clinical Data: Dizziness  CT HEAD WITHOUT CONTRAST  Technique:  Contiguous axial images were obtained from the base of the skull through the vertex without contrast. Study was obtained within 24 hours of patient arrival at the emergency department.  Comparison: December 13, 2010  Findings: The ventricles are normal in size and configuration. There is no mass, hemorrhage, extra-axial fluid collection, or midline shift.  There is mild small vessel disease in the anterior centra semiovale bilaterally, stable.  Elsewhere, gray-white compartments are normal.  No acute infarct is apparent.  Bony calvarium appears intact.  The mastoid air cells are clear.  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 No demonstrable mass, hemorrhage, or acute appearing infarct.   Original Report Authenticated By: Bretta Bang, M.D.   Mr Brain Wo Contrast  12/22/2012   *RADIOLOGY REPORT*  Clinical Data: Cough.  Bronchitis.  Dizziness.  MRI HEAD WITHOUT CONTRAST  Technique:  Multiplanar, multiecho pulse sequences of the brain and surrounding structures were obtained according to standard protocol without intravenous contrast.   Comparison: Head CT same day  Findings: Diffusion imaging does not show any acute or subacute infarction.  The brainstem and cerebellum are normal.  Within the cerebral hemispheres, there are a few scattered foci of T2 and FLAIR signal within the deep and subcortical white matter.  The findings could represent chronic small vessel disease that could represent other processes such as demyelinating disease/multiple sclerosis.  No cortical or large vessel territory abnormality.  No mass lesion, hemorrhage, hydrocephalus or extra-axial collection. No pituitary mass.  No fluid in the sinuses, middle ears or mastoids.  No skull or skull base lesion.  IMPRESSION: No acute infarction.  Areas of abnormal T2 and FLAIR signal within the cerebral hemispheric white matter that could indicate chronic small vessel change or other white matter processes such as demyelinating disease/multiple sclerosis.   Original Report Authenticated By: Paulina Fusi, M.D.    MDM   1. CAP (community acquired pneumonia)   2. Oxygen dependent     4:28 PM Patient will have head CT to rule  out CVA. Patient has no neuro deficits at this time. Labs show elevated WBC likely due to leukemia.   8:17 PM CT shows no acute change. Patient admitted for stroke work up and pneumonia with oxygen dependence.   Emilia Braddy, PA-C 12/22/12 2017

## 2012-12-22 NOTE — Progress Notes (Signed)
ANTIBIOTIC CONSULT NOTE - INITIAL  Pharmacy Consult for Levaquin Indication: rule out pneumonia  Allergies  Allergen Reactions  . Penicillins Hives and Nausea And Vomiting    Patient Measurements: Height: 5\' 9"  (175.3 cm) Weight: 120 lb (54.432 kg) IBW/kg (Calculated) : 66.2  Vital Signs: Temp: 98.4 F (36.9 C) (09/08 1513) Temp src: Oral (09/08 1513) BP: 103/45 mmHg (09/08 2015) Pulse Rate: 93 (09/08 2015) Intake/Output from previous day:   Intake/Output from this shift:    Labs:  Recent Labs  12/22/12 1517  WBC 45.9*  HGB 15.4*  PLT 174  CREATININE 0.57   Estimated Creatinine Clearance: 61.8 ml/min (by C-G formula based on Cr of 0.57). No results found for this basename: VANCOTROUGH, VANCOPEAK, VANCORANDOM, GENTTROUGH, GENTPEAK, GENTRANDOM, TOBRATROUGH, TOBRAPEAK, TOBRARND, AMIKACINPEAK, AMIKACINTROU, AMIKACIN,  in the last 72 hours   Microbiology: No results found for this or any previous visit (from the past 720 hour(s)).  Medical History: Past Medical History  Diagnosis Date  . Headache(784.0)   . Pleurisy   . Bronchospasm   . Chronic lymphocytic leukemia   . Pneumonia   . Thrombocytopenia   . Olecranon bursitis of left elbow November 2012.  . Tobacco abuse   . Frontal sinusitis November 2012  . COPD (chronic obstructive pulmonary disease)     Assessment: 62 y.o. F who presented to the [facility] on 12/22/12 with dizziness. Pharmacy was consulted to start Levaquin for empiric CAP coverage. The patient already received a dose in the [facility] around 2000 today. Afebrile, WBC 45.9 (likely elevated d/t CLL), CrCl~50 ml/min.   Goal of Therapy:  Proper antibiotics for infection/cultures adjusted for renal/hepatic function   Plan:  1. Levaquin 750 mg IV every 24 hours (next dose due on 9/9 at 2000) 2. Will continue to follow renal function, culture results, LOT, and antibiotic de-escalation plans   Georgina Pillion, PharmD, BCPS Clinical Pharmacist Pager:  [phone] 12/22/2012 9:22 PM

## 2012-12-22 NOTE — H&P (Addendum)
[facility] History and Physical  Jori I Kofman ZOX:[id number] DOB: Apr 07, 1951 DOA: 12/22/2012  Referring physician: ER physician. PCP: DAVIS,JEROME, MD   Chief Complaint: Dizziness.  HPI: Sheri Ayala is a 62 y.o. female with history of CLL presented to the ER because of increasing dizziness since yesterday. Patient states her symptoms started yesterday morning after she woke up when she was not able to balance while trying to walk. Patient has a chronic right-sided headache for which she takes [place] powder. Denies any loss of consciousness or any focal deficits. Since the symptoms persisted patient came to the ER. CT head MRI brain does not show anything acute. In addition patient has been having shortness of breath for last 3 days. On exam patient was found to be wheezing. Chest x-ray shows possible pneumonia. Patient has been admitted for further management. Patient denies any chest pain. Has productive cough.  Review of Systems: As presented in the history of presenting illness, rest negative.  Past Medical History  Diagnosis Date  . Headache(784.0)   . Pleurisy   . Bronchospasm   . Chronic lymphocytic leukemia   . Pneumonia   . Thrombocytopenia   . Olecranon bursitis of left elbow November 2012.  . Tobacco abuse   . Frontal sinusitis November 2012  . COPD (chronic obstructive pulmonary disease)    Past Surgical History  Procedure Laterality Date  . Abdominal hysterectomy     Social History:  reports that she has been smoking Cigarettes.  She has a 45 pack-year smoking history. She has never used smokeless tobacco. She reports that she does not drink alcohol or use illicit drugs. Home. where does patient live-- Yes. Can patient participate in ADLs?  Allergies  Allergen Reactions  . Penicillins Hives and Nausea And Vomiting    Family History  Problem Relation Age of Onset  . Aneurysm Mother       Prior to Admission medications   Medication Sig Start Date End Date  Taking? Authorizing Provider  Aspirin-Acetaminophen-Caffeine (GOODY HEADACHE PO) Take 2 packets by mouth daily. Headaches   Yes Historical Provider, MD  neomycin-bacitracin-polymyxin (NEOSPORIN) ointment Apply 1 application topically daily as needed (for bug bites).    Yes Historical Provider, MD   Physical Exam: Filed Vitals:   12/22/12 1830 12/22/12 1900 12/22/12 2004 12/22/12 2015  BP: 108/61 114/59 114/56 103/45  Pulse: 108 107 95 93  Temp:      TempSrc:      Resp:      Height:      Weight:      SpO2: 93% 95% 93% 95%     General:  Well-developed and moderately nourished.  Eyes: Anicteric no pallor.  ENT: No discharge from ears eyes nose mouth.  Neck: No mass felt.  Cardiovascular: S1-S2 heard.  Respiratory: Bilateral expiratory wheeze heard. No crepitations.  Abdomen: Soft nontender bowel sounds present.  Skin: No rash.  Musculoskeletal: No edema.  Psychiatric: Appears normal.  Neurologic: Alert awake oriented to time place and person. Moves all extremities.  Labs on Admission:  Basic Metabolic Panel:  Recent Labs Lab 12/22/12 1517  NA 139  K 4.4  CL 102  CO2 30  GLUCOSE 117*  BUN 18  CREATININE 0.57  CALCIUM 9.0   Liver Function Tests: No results found for this basename: AST, ALT, ALKPHOS, BILITOT, PROT, ALBUMIN,  in the last 168 hours No results found for this basename: LIPASE, AMYLASE,  in the last 168 hours No results found for this basename: AMMONIA,  in the last 168 hours CBC:  Recent Labs Lab 12/22/12 1517  WBC 45.9*  HGB 15.4*  HCT 49.0*  MCV 96.6  PLT 174   Cardiac Enzymes: No results found for this basename: CKTOTAL, CKMB, CKMBINDEX, TROPONINI,  in the last 168 hours  BNP (last 3 results) No results found for this basename: PROBNP,  in the last 8760 hours CBG: No results found for this basename: GLUCAP,  in the last 168 hours  Radiological Exams on Admission: Dg Chest 2 View  12/22/2012   *RADIOLOGY REPORT*  Clinical Data:  Persistent cough for 2 weeks.  History of pneumonia. History of smoking, COPD.  Fever.  Dizziness.  CHEST - 2 VIEW  Comparison: 04/28/2011  Findings: Lungs are hyperinflated.  There is perihilar peribronchial thickening.  Prominent interstitial markings are favored to be chronic.  There are no focal consolidations or pleural effusions.  There has been some improvement in right lower lobe opacity with persistent density likely representing scarring. No pulmonary edema. Visualized osseous structures have a normal appearance.  IMPRESSION:  1.  COPD and bronchitic changes. 2.  Improvement in right lower lobe opacity, with residual probable scarring.   Original Report Authenticated By: Norva Pavlov, M.D.   Ct Head Wo Contrast  12/22/2012   *RADIOLOGY REPORT*  Clinical Data: Dizziness  CT HEAD WITHOUT CONTRAST  Technique:  Contiguous axial images were obtained from the base of the skull through the vertex without contrast. Study was obtained within 24 hours of patient arrival at the emergency department.  Comparison: December 13, 2010  Findings: The ventricles are normal in size and configuration. There is no mass, hemorrhage, extra-axial fluid collection, or midline shift.  There is mild small vessel disease in the anterior centra semiovale bilaterally, stable.  Elsewhere, gray-white compartments are normal.  No acute infarct is apparent.  Bony calvarium appears intact.  The mastoid air cells are clear.  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 No demonstrable mass, hemorrhage, or acute appearing infarct.   Original Report Authenticated By: Bretta Bang, M.D.   Mr Brain Wo Contrast  12/22/2012   *RADIOLOGY REPORT*  Clinical Data: Cough.  Bronchitis.  Dizziness.  MRI HEAD WITHOUT CONTRAST  Technique:  Multiplanar, multiecho pulse sequences of the brain and surrounding structures were obtained according to standard protocol without intravenous contrast.  Comparison: Head CT  same day  Findings: Diffusion imaging does not show any acute or subacute infarction.  The brainstem and cerebellum are normal.  Within the cerebral hemispheres, there are a few scattered foci of T2 and FLAIR signal within the deep and subcortical white matter.  The findings could represent chronic small vessel disease that could represent other processes such as demyelinating disease/multiple sclerosis.  No cortical or large vessel territory abnormality.  No mass lesion, hemorrhage, hydrocephalus or extra-axial collection. No pituitary mass.  No fluid in the sinuses, middle ears or mastoids.  No skull or skull base lesion.  IMPRESSION: No acute infarction.  Areas of abnormal T2 and FLAIR signal within the cerebral hemispheric white matter that could indicate chronic small vessel change or other white matter processes such as demyelinating disease/multiple sclerosis.   Original Report Authenticated By: Paulina Fusi, M.D.    EKG: Independently reviewed. Normal sinus rhythm.  Assessment/Plan Principal Problem:   Dizziness Active Problems:   CLL (chronic lymphocytic leukemia)   COPD with acute exacerbation   1. Dizziness - check orthostatics. Have discussed with on-call neurologist Dr. Roseanne Reno about the MRI findings.  Dr. Roseanne Reno feels these findings are nonspecific and has recommended meclizine if dizziness persist. 2. COPD and possible pneumonia - patient has been placed on Levaquin. Continue with nebulizer and Pulmicort. Check CT angiogram of the chest to rule out any PE or mass. 3. History of CLL - under observation. 4. Tobacco abuse - advised to quit smoking.  Addendum - patient was still complaining of blurred vision which patient has been having since admission.. I have examined patient at bedside. Patient is nonfocal. Patient is not in acute distress. At this time I have reconsulted neurologist and have discussed with Dr. Amada Jupiter on-call neurologist. Patient will be placed on neurochecks and  carotid Dopplers have been ordered. Dr. Amada Jupiter has requested also to get patient's oncologist opinion. I will let Dr. Rhona Leavens, on coming hospitalist colleague to consult patient's oncologist Dr. Myrle Sheng.  Code Status: Full code.  Family Communication: None.  Disposition Plan: Admit to inpatient.    KAKRAKANDY,ARSHAD N. [facility] Pager [phone].  If 7PM-7AM, please contact night-coverage [email or web address] Password [facility] 12/22/2012, 8:24 PM

## 2012-12-23 ENCOUNTER — Inpatient Hospital Stay (HOSPITAL_COMMUNITY): Payer: Self-pay

## 2012-12-23 DIAGNOSIS — R42 Dizziness and giddiness: Secondary | ICD-10-CM

## 2012-12-23 DIAGNOSIS — J189 Pneumonia, unspecified organism: Secondary | ICD-10-CM

## 2012-12-23 DIAGNOSIS — C9110 Chronic lymphocytic leukemia of B-cell type not having achieved remission: Secondary | ICD-10-CM

## 2012-12-23 DIAGNOSIS — H5310 Unspecified subjective visual disturbances: Secondary | ICD-10-CM

## 2012-12-23 LAB — BASIC METABOLIC PANEL
Chloride: 103 mEq/L (ref 96–112)
GFR calc Af Amer: >90 mL/min (ref >90)
Potassium: 3.7 mEq/L (ref 3.5–5.1)

## 2012-12-23 LAB — AMMONIA: Ammonia: 47 umol/L (ref 11–60)

## 2012-12-23 LAB — CBC
HCT: 40 % (ref 36.0–46.0)
Hemoglobin: 12.7 g/dL (ref 12.0–15.0)
RDW: 14.8 % (ref 11.5–15.5)
WBC: 26.6 10*3/uL — ABNORMAL HIGH (ref 4.0–10.5)

## 2012-12-23 MED ORDER — INFLUENZA VAC SPLIT QUAD 0.5 ML IM SUSP
0.5000 mL | INTRAMUSCULAR | Status: AC
  Filled 2012-12-23 (×2): qty 0.5

## 2012-12-23 MED ORDER — DEXTROSE 5 % IV SOLN
1000.0000 mg | Freq: Once | INTRAVENOUS | Status: AC
  Administered 2012-12-23: 1000 mg via INTRAVENOUS
  Filled 2012-12-23: qty 10

## 2012-12-23 MED ORDER — PNEUMOCOCCAL VAC POLYVALENT 25 MCG/0.5ML IJ INJ
0.5000 mL | INJECTION | INTRAMUSCULAR | Status: AC
  Filled 2012-12-23: qty 0.5

## 2012-12-23 MED ORDER — MAGNESIUM SULFATE 40 MG/ML IJ SOLN
2.0000 g | Freq: Once | INTRAMUSCULAR | Status: AC
  Administered 2012-12-23: 2 g via INTRAVENOUS
  Filled 2012-12-23: qty 50

## 2012-12-23 MED ORDER — DIPHENHYDRAMINE HCL 50 MG/ML IJ SOLN
12.5000 mg | Freq: Once | INTRAMUSCULAR | Status: AC
  Administered 2012-12-23: 12.5 mg via INTRAVENOUS
  Filled 2012-12-23: qty 1

## 2012-12-23 MED ORDER — OXYCODONE-ACETAMINOPHEN 5-325 MG PO TABS
1.0000 | ORAL_TABLET | Freq: Once | ORAL | Status: AC
  Administered 2012-12-23: 1 via ORAL
  Filled 2012-12-23: qty 1

## 2012-12-23 MED ORDER — ENSURE COMPLETE PO LIQD
237.0000 mL | Freq: Two times a day (BID) | ORAL | Status: DC
  Administered 2012-12-24 (×2): 237 mL via ORAL

## 2012-12-23 MED ORDER — IOHEXOL 350 MG/ML SOLN
100.0000 mL | Freq: Once | INTRAVENOUS | Status: AC | PRN
  Administered 2012-12-23: 100 mL via INTRAVENOUS

## 2012-12-23 MED ORDER — PROCHLORPERAZINE EDISYLATE 5 MG/ML IJ SOLN
10.0000 mg | Freq: Four times a day (QID) | INTRAMUSCULAR | Status: DC | PRN
  Filled 2012-12-23: qty 2

## 2012-12-23 MED ORDER — INFLUENZA VAC SPLIT QUAD 0.5 ML IM SUSP: 0.5000 mL | INTRAMUSCULAR | Status: DC

## 2012-12-23 NOTE — Progress Notes (Signed)
Patient is experiencing visual symptoms.  She states that peripherally on the right when she is looking at me, there are "two" of me.  As she looks at the TV, she sees four images where there is one.  She states that when I am directly in front of her, her vision is just blurred.  She states that this is not as pronounced peripherally on the left.  Also, just received a phone call from CMT that patient had a brief run of 2nd Degree HB Type 1.  Pt now back in NSR.  Notified MD on-call.  MD believes this may be due to the Percocet being given for headache.  Will continue to monitor.  Arva Chafe

## 2012-12-23 NOTE — Progress Notes (Addendum)
[facility] PROGRESS NOTE  Sheri Ayala ZOX:[id number] DOB: 1950-09-30 DOA: 12/22/2012 PCP: Louanna Raw, MD  Assessment/Plan: 1. Dizziness  1. Discussed case with Hematology re: possible hypercoagulable state in the setting of CLL - Heme/Onc recommends routine work up for central vertigo 2. Neuro consulted for further recs 3. Will consult PT/OT 2. COPD and possible pneumonia 1. Cont on Levaquin. Continue with nebulizer and Pulmicort. Check CT angiogram of the chest to rule out any PE or mass. 3. History of CLL - Cont to monitor 4. Tobacco abuse - advised to quit smoking.  Code Status: Full Family Communication: Pt in room (indicate person spoken with, relationship, and if by phone, the number) Disposition Plan: Pending  Antibiotics:  Levaquin  HPI/Subjective: Continues to feel "dizzy" and nauseated. Also reports double vision. Sx unchanged this AM  Objective: Filed Vitals:   12/23/12 0118 12/23/12 0634 12/23/12 0636 12/23/12 0637  BP:  106/63 106/66 97/62  Pulse: 78 87 85 91  Temp:  98.5 F (36.9 C)    TempSrc:  Oral    Resp: 18 18 18 18   Height:      Weight:      SpO2: 97% 95% 95% 97%    Intake/Output Summary (Last 24 hours) at 12/23/12 0841 Last data filed at 12/23/12 0300  Gross per 24 hour  Intake    183 ml  Output      0 ml  Net    183 ml   Filed Weights   12/22/12 1513 12/22/12 2128  Weight: 54.432 kg (120 lb) 52.073 kg (114 lb 12.8 oz)    Exam:   General:  Awake, in nad  Cardiovascular: regular, s1, s2  Respiratory: normal resp effort, no wheezing  Abdomen: soft, nondistended  Musculoskeletal: perfused, no clubbing   Data Reviewed: Basic Metabolic Panel:  Recent Labs Lab 12/22/12 1517 12/23/12 0437  NA 139 138  K 4.4 3.7  CL 102 103  CO2 30 28  GLUCOSE 117* 93  BUN 18 19  CREATININE 0.57 0.46*  CALCIUM 9.0 8.2*   Liver Function Tests: No results found for this basename: AST, ALT, ALKPHOS, BILITOT, PROT, ALBUMIN,  in the last  168 hours No results found for this basename: LIPASE, AMYLASE,  in the last 168 hours No results found for this basename: AMMONIA,  in the last 168 hours CBC:  Recent Labs Lab 12/22/12 1517 12/23/12 0437  WBC 45.9* 26.6*  HGB 15.4* 12.7  HCT 49.0* 40.0  MCV 96.6 96.6  PLT 174 127*   Cardiac Enzymes: No results found for this basename: CKTOTAL, CKMB, CKMBINDEX, TROPONINI,  in the last 168 hours BNP (last 3 results) No results found for this basename: PROBNP,  in the last 8760 hours CBG: No results found for this basename: GLUCAP,  in the last 168 hours  No results found for this or any previous visit (from the past 240 hour(s)).   Studies: Dg Chest 2 View  12/22/2012   *RADIOLOGY REPORT*  Clinical Data: Persistent cough for 2 weeks.  History of pneumonia. History of smoking, COPD.  Fever.  Dizziness.  CHEST - 2 VIEW  Comparison: 04/28/2011  Findings: Lungs are hyperinflated.  There is perihilar peribronchial thickening.  Prominent interstitial markings are favored to be chronic.  There are no focal consolidations or pleural effusions.  There has been some improvement in right lower lobe opacity with persistent density likely representing scarring. No pulmonary edema. Visualized osseous structures have a normal appearance.  IMPRESSION:  1.  COPD  and bronchitic changes. 2.  Improvement in right lower lobe opacity, with residual probable scarring.   Original Report Authenticated By: Norva Pavlov, M.D.   Ct Head Wo Contrast  12/22/2012   *RADIOLOGY REPORT*  Clinical Data: Dizziness  CT HEAD WITHOUT CONTRAST  Technique:  Contiguous axial images were obtained from the base of the skull through the vertex without contrast. Study was obtained within 24 hours of patient arrival at the emergency department.  Comparison: December 13, 2010  Findings: The ventricles are normal in size and configuration. There is no mass, hemorrhage, extra-axial fluid collection, or midline shift.  There is mild small  vessel disease in the anterior centra semiovale bilaterally, stable.  Elsewhere, gray-white compartments are normal.  No acute infarct is apparent.  Bony calvarium appears intact.  The mastoid air cells are clear.  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 No demonstrable mass, hemorrhage, or acute appearing infarct.   Original Report Authenticated By: Bretta Bang, M.D.   Ct Angio Chest Pe W/cm &/or Wo Cm  12/23/2012   *RADIOLOGY REPORT*  Clinical Data: Evaluate for pulmonary embolus.  CT ANGIOGRAPHY CHEST  Technique:  Multidetector CT imaging of the chest using the standard protocol during bolus administration of intravenous contrast. Multiplanar reconstructed images including MIPs were obtained and reviewed to evaluate the vascular anatomy.  Contrast: OMNIPAQUE IOHEXOL 350 MG/ML SOLN  Comparison: The none  Findings: Nodule in the right lower lobe is indeterminate measuring 1.9 cm, image 80/series 7.  Patchy areas of ground-glass attenuation are scattered throughout the left lower lobe.  Heart size appears normal.  There is no pericardial effusion identified. Enlarged mediastinal lymph nodes are noted.  Index subcarinal lymph node measures 1.5 cm.  Right paratracheal lymph node measures 1 cm. There is an AP window lymph node which measures 1 cm, image 40/series 5.  The main pulmonary artery appears patent no lobar or segmental pulmonary artery filling defects are identified.  No axillary or supraclavicular adenopathy identified.  Calcified nodule is identified within the left lobe of thyroid gland. Imaging through the upper abdomen shows no acute findings.  The visualized osseous structures are unremarkable.  IMPRESSION:  1.  No evidence for acute pulmonary embolus. 2.  Peripheral nodule in the right lung base is nonspecific.  This may be related to recent infection.  Follow-up imaging after appropriate antibiotic therapy is advised to ensure resolution.  If this  does not resolve then a PET CT would be recommended to assess for pulmonary malignancy. 3.  Enlarged mediastinal lymph nodes may be related to the patient's history of CLL. 4.  Patchy opacities within the left lower lobe are likely post inflammatory or infectious in etiology.   Original Report Authenticated By: Signa Kell, M.D.   Mr Brain Wo Contrast  12/22/2012   *RADIOLOGY REPORT*  Clinical Data: Cough.  Bronchitis.  Dizziness.  MRI HEAD WITHOUT CONTRAST  Technique:  Multiplanar, multiecho pulse sequences of the brain and surrounding structures were obtained according to standard protocol without intravenous contrast.  Comparison: Head CT same day  Findings: Diffusion imaging does not show any acute or subacute infarction.  The brainstem and cerebellum are normal.  Within the cerebral hemispheres, there are a few scattered foci of T2 and FLAIR signal within the deep and subcortical white matter.  The findings could represent chronic small vessel disease that could represent other processes such as demyelinating disease/multiple sclerosis.  No cortical or large vessel territory abnormality.  No mass lesion,  hemorrhage, hydrocephalus or extra-axial collection. No pituitary mass.  No fluid in the sinuses, middle ears or mastoids.  No skull or skull base lesion.  IMPRESSION: No acute infarction.  Areas of abnormal T2 and FLAIR signal within the cerebral hemispheric white matter that could indicate chronic small vessel change or other white matter processes such as demyelinating disease/multiple sclerosis.   Original Report Authenticated By: Paulina Fusi, M.D.    Scheduled Meds: . budesonide (PULMICORT) nebulizer solution  0.25 mg Nebulization BID  . enoxaparin (LOVENOX) injection  40 mg Subcutaneous Q24H  . influenza vac split quadrivalent PF  0.5 mL Intramuscular Tomorrow-1000  . ipratropium  0.5 mg Nebulization Q6H  . levalbuterol  0.63 mg Nebulization Q6H  . levofloxacin (LEVAQUIN) IV  750 mg  Intravenous Q24H  . pneumococcal 23 valent vaccine  0.5 mL Intramuscular Tomorrow-1000  . sodium chloride  3 mL Intravenous Q12H   Continuous Infusions: . sodium chloride 20 mL/hr at 12/23/12 0121    Principal Problem:   Dizziness Active Problems:   CLL (chronic lymphocytic leukemia)   COPD with acute exacerbation    Time spent:    CHIU, STEPHEN K  [facility] Pager [phone]. If 7PM-7AM, please contact night-coverage at [email or web address], password [facility] 12/23/2012, 8:41 AM  LOS: 1 day

## 2012-12-23 NOTE — Consult Note (Signed)
NEURO HOSPITALIST CONSULT NOTE    Reason for Consult: diplopia and dizziness for 2 days  HPI:                                                                                                                                          Sheri Ayala is an 62 y.o. female with history of CLL who presented to the ED after waking up 2 days ago and "feeling drunk".  She states she was off balance and unable to steady herself in brith directions needing to hold onto the walls for assistance. She also noted her "vision was blurry when looking across the room but not at short distances".  When she would get up to walk she noted the room was moving from "right to left" but did not do so when she was still. While speaking to her, she denies any vertigo but does admit to continued blurred vision when looking across the room, in addition to vertical diplopia. She denies any numbness, tingling, dysarthria or dysphagia. When asked about the asterixis of her arms, she states this also is new.   Past Medical History  Diagnosis Date  . Headache(784.0)   . Pleurisy   . Bronchospasm   . Chronic lymphocytic leukemia   . Pneumonia   . Thrombocytopenia   . Olecranon bursitis of left elbow November 2012.  . Tobacco abuse   . Frontal sinusitis November 2012  . COPD (chronic obstructive pulmonary disease)     Past Surgical History  Procedure Laterality Date  . Abdominal hysterectomy      Family History  Problem Relation Age of Onset  . Aneurysm Mother      Social History:  reports that she has been smoking Cigarettes.  She has a 45 pack-year smoking history. She has never used smokeless tobacco. She reports that she does not drink alcohol or use illicit drugs.  Allergies  Allergen Reactions  . Penicillins Hives and Nausea And Vomiting    MEDICATIONS:                                                                                                                     Prior to Admission:   Prescriptions prior to admission  Medication Sig Dispense Refill  . Aspirin-Acetaminophen-Caffeine (GOODY  HEADACHE PO) Take 2 packets by mouth daily. Headaches      . neomycin-bacitracin-polymyxin (NEOSPORIN) ointment Apply 1 application topically daily as needed (for bug bites).        Scheduled: . budesonide (PULMICORT) nebulizer solution  0.25 mg Nebulization BID  . enoxaparin (LOVENOX) injection  40 mg Subcutaneous Q24H  . feeding supplement  237 mL Oral BID BM  . influenza vac split quadrivalent PF  0.5 mL Intramuscular Tomorrow-1000  . ipratropium  0.5 mg Nebulization Q6H  . levalbuterol  0.63 mg Nebulization Q6H  . levofloxacin (LEVAQUIN) IV  750 mg Intravenous Q24H  . pneumococcal 23 valent vaccine  0.5 mL Intramuscular Tomorrow-1000  . sodium chloride  3 mL Intravenous Q12H     ROS:                                                                                                                                       History obtained from the patient  General ROS: negative for - chills, fatigue, fever, night sweats, weight gain or weight loss Psychological ROS: negative for - behavioral disorder, hallucinations, memory difficulties, mood swings or suicidal ideation Ophthalmic ROS: negative for - blurry vision, double vision, eye pain or loss of vision ENT ROS: negative for - epistaxis, nasal discharge, oral lesions, sore throat, tinnitus or vertigo Allergy and Immunology ROS: negative for - hives or itchy/watery eyes Hematological and Lymphatic ROS: negative for - bleeding problems, bruising or swollen lymph nodes Endocrine ROS: negative for - galactorrhea, hair pattern changes, polydipsia/polyuria or temperature intolerance Respiratory ROS: negative for - cough, hemoptysis, shortness of breath or wheezing Cardiovascular ROS: negative for - chest pain, dyspnea on exertion, edema or irregular heartbeat Gastrointestinal ROS: negative for - abdominal pain, diarrhea, hematemesis,  nausea/vomiting or stool incontinence Genito-Urinary ROS: negative for - dysuria, hematuria, incontinence or urinary frequency/urgency Musculoskeletal ROS: negative for - joint swelling or muscular weakness Neurological ROS: as noted in HPI Dermatological ROS: negative for rash and skin lesion changes   Blood pressure 113/50, pulse 87, temperature 98.6 F (37 C), temperature source Oral, resp. rate 18, height 5\' 10"  (1.778 m), weight 52.073 kg (114 lb 12.8 oz), SpO2 95.00%.   Neurologic Examination:                                                                                                      Mental Status: Alert, oriented, thought content appropriate.  Speech fluent without evidence of aphasia.  Able to follow 3 step  commands without difficulty. Cranial Nerves: II: Discs flat bilaterally; Visual fields grossly normal--states she see double (virtical) when looking at the calender on the wall 10 feet away but no diplopia when looking at my finger 4 feet away, pupils anisocoric L 1mm >right, round, reactive to light and accommodation III,IV, VI: ptosis present right eye (old), extra-ocular motions intact bilaterally--nystagmus both horizontal (fast phase to the left and more pronounced when looking to the left) and vertical when looking up V,VII: smile symmetric, facial light touch sensation normal bilaterally VIII: hearing normal bilaterally IX,X: gag reflex present XI: bilateral shoulder shrug XII: midline tongue extension Motor: Right : Upper extremity   5/5    Left:     Upper extremity   5/5  Lower extremity   5/5     Lower extremity   5/5 --asterixis noted in bilateral arms and legs when held outstretched Tone and bulk:normal tone throughout; no atrophy noted Sensory: Pinprick and light touch intact throughout, bilaterally Deep Tendon Reflexes:  Right: Upper Extremity   Left: Upper extremity   biceps (C-5 to C-6) 2/4   biceps (C-5 to C-6) 2/4 tricep (C7) 2/4    triceps (C7)  2/4 Brachioradialis (C6) 2/4  Brachioradialis (C6) 2/4  Lower Extremity Lower Extremity  quadriceps (L-2 to L-4) 2/4   quadriceps (L-2 to L-4) 2/4 Achilles (S1) 1/4   Achilles (S1) 1/4  Plantars: Right: downgoing   Left: downgoing Cerebellar: normal finger-to-nose,  normal heel-to-shin test Gait: wide based and unsteady to both sides.  CV: pulses palpable throughout    No results found for this basename: cbc, bmp, coags, chol, tri, ldl, hga1c    Results for orders placed during the hospital encounter of 12/22/12 (from the past 48 hour(s))  CBC     Status: Abnormal   Collection Time    12/22/12  3:17 PM      Result Value Range   WBC 45.9 (*) 4.0 - 10.5 K/uL   RBC 5.07  3.87 - 5.11 MIL/uL   Hemoglobin 15.4 (*) 12.0 - 15.0 g/dL   HCT 64.3 (*) 32.9 - 51.8 %   MCV 96.6  78.0 - 100.0 fL   MCH 30.4  26.0 - 34.0 pg   MCHC 31.4  30.0 - 36.0 g/dL   RDW 84.1  66.0 - 63.0 %   Platelets 174  150 - 400 K/uL  BASIC METABOLIC PANEL     Status: Abnormal   Collection Time    12/22/12  3:17 PM      Result Value Range   Sodium 139  135 - 145 mEq/L   Potassium 4.4  3.5 - 5.1 mEq/L   Chloride 102  96 - 112 mEq/L   CO2 30  19 - 32 mEq/L   Glucose, Bld 117 (*) 70 - 99 mg/dL   BUN 18  6 - 23 mg/dL   Creatinine, Ser 1.60  0.50 - 1.10 mg/dL   Calcium 9.0  8.4 - 10.9 mg/dL   GFR calc non Af Amer >90  >90 mL/min   GFR calc Af Amer >90  >90 mL/min   Comment: (NOTE)     The eGFR has been calculated using the CKD EPI equation.     This calculation has not been validated in all clinical situations.     eGFR's persistently <90 mL/min signify possible Chronic Kidney     Disease.  POCT I-STAT TROPONIN I     Status: None   Collection Time    12/22/12  5:03 PM      Result Value Range   Troponin i, poc 0.02  0.00 - 0.08 ng/mL   Comment 3            Comment: Due to the release kinetics of cTnI,     a negative result within the first hours     of the onset of symptoms does not rule out      myocardial infarction with certainty.     If myocardial infarction is still suspected,     repeat the test at appropriate intervals.  BASIC METABOLIC PANEL     Status: Abnormal   Collection Time    12/23/12  4:37 AM      Result Value Range   Sodium 138  135 - 145 mEq/L   Potassium 3.7  3.5 - 5.1 mEq/L   Chloride 103  96 - 112 mEq/L   CO2 28  19 - 32 mEq/L   Glucose, Bld 93  70 - 99 mg/dL   BUN 19  6 - 23 mg/dL   Creatinine, Ser 4.54 (*) 0.50 - 1.10 mg/dL   Calcium 8.2 (*) 8.4 - 10.5 mg/dL   GFR calc non Af Amer >90  >90 mL/min   GFR calc Af Amer >90  >90 mL/min   Comment: (NOTE)     The eGFR has been calculated using the CKD EPI equation.     This calculation has not been validated in all clinical situations.     eGFR's persistently <90 mL/min signify possible Chronic Kidney     Disease.  CBC     Status: Abnormal   Collection Time    12/23/12  4:37 AM      Result Value Range   WBC 26.6 (*) 4.0 - 10.5 K/uL   RBC 4.14  3.87 - 5.11 MIL/uL   Hemoglobin 12.7  12.0 - 15.0 g/dL   Comment: DELTA CHECK NOTED     REPEATED TO VERIFY     SPECIMEN CHECKED FOR CLOTS   HCT 40.0  36.0 - 46.0 %   MCV 96.6  78.0 - 100.0 fL   MCH 30.7  26.0 - 34.0 pg   MCHC 31.8  30.0 - 36.0 g/dL   RDW 09.8  11.9 - 14.7 %   Platelets 127 (*) 150 - 400 K/uL   Comment: DELTA CHECK NOTED     REPEATED TO VERIFY     SPECIMEN CHECKED FOR CLOTS    Dg Chest 2 View  12/22/2012   *RADIOLOGY REPORT*  Clinical Data: Persistent cough for 2 weeks.  History of pneumonia. History of smoking, COPD.  Fever.  Dizziness.  CHEST - 2 VIEW  Comparison: 04/28/2011  Findings: Lungs are hyperinflated.  There is perihilar peribronchial thickening.  Prominent interstitial markings are favored to be chronic.  There are no focal consolidations or pleural effusions.  There has been some improvement in right lower lobe opacity with persistent density likely representing scarring. No pulmonary edema. Visualized osseous structures have a  normal appearance.  IMPRESSION:  1.  COPD and bronchitic changes. 2.  Improvement in right lower lobe opacity, with residual probable scarring.   Original Report Authenticated By: Norva Pavlov, M.D.   Ct Head Wo Contrast  12/22/2012   *RADIOLOGY REPORT*  Clinical Data: Dizziness  CT HEAD WITHOUT CONTRAST  Technique:  Contiguous axial images were obtained from the base of the skull through the vertex without contrast. Study was obtained within 24 hours of patient arrival at the emergency  department.  Comparison: December 13, 2010  Findings: The ventricles are normal in size and configuration. There is no mass, hemorrhage, extra-axial fluid collection, or midline shift.  There is mild small vessel disease in the anterior centra semiovale bilaterally, stable.  Elsewhere, gray-white compartments are normal.  No acute infarct is apparent.  Bony calvarium appears intact.  The mastoid air cells are clear.  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 No demonstrable mass, hemorrhage, or acute appearing infarct.   Original Report Authenticated By: Bretta Bang, M.D.   Ct Angio Chest Pe W/cm &/or Wo Cm  12/23/2012   *RADIOLOGY REPORT*  Clinical Data: Evaluate for pulmonary embolus.  CT ANGIOGRAPHY CHEST  Technique:  Multidetector CT imaging of the chest using the standard protocol during bolus administration of intravenous contrast. Multiplanar reconstructed images including MIPs were obtained and reviewed to evaluate the vascular anatomy.  Contrast: OMNIPAQUE IOHEXOL 350 MG/ML SOLN  Comparison: The none  Findings: Nodule in the right lower lobe is indeterminate measuring 1.9 cm, image 80/series 7.  Patchy areas of ground-glass attenuation are scattered throughout the left lower lobe.  Heart size appears normal.  There is no pericardial effusion identified. Enlarged mediastinal lymph nodes are noted.  Index subcarinal lymph node measures 1.5 cm.  Right paratracheal lymph node  measures 1 cm. There is an AP window lymph node which measures 1 cm, image 40/series 5.  The main pulmonary artery appears patent no lobar or segmental pulmonary artery filling defects are identified.  No axillary or supraclavicular adenopathy identified.  Calcified nodule is identified within the left lobe of thyroid gland. Imaging through the upper abdomen shows no acute findings.  The visualized osseous structures are unremarkable.  IMPRESSION:  1.  No evidence for acute pulmonary embolus. 2.  Peripheral nodule in the right lung base is nonspecific.  This may be related to recent infection.  Follow-up imaging after appropriate antibiotic therapy is advised to ensure resolution.  If this does not resolve then a PET CT would be recommended to assess for pulmonary malignancy. 3.  Enlarged mediastinal lymph nodes may be related to the patient's history of CLL. 4.  Patchy opacities within the left lower lobe are likely post inflammatory or infectious in etiology.   Original Report Authenticated By: Signa Kell, M.D.   Mr Brain Wo Contrast  12/22/2012   *RADIOLOGY REPORT*  Clinical Data: Cough.  Bronchitis.  Dizziness.  MRI HEAD WITHOUT CONTRAST  Technique:  Multiplanar, multiecho pulse sequences of the brain and surrounding structures were obtained according to standard protocol without intravenous contrast.  Comparison: Head CT same day  Findings: Diffusion imaging does not show any acute or subacute infarction.  The brainstem and cerebellum are normal.  Within the cerebral hemispheres, there are a few scattered foci of T2 and FLAIR signal within the deep and subcortical white matter.  The findings could represent chronic small vessel disease that could represent other processes such as demyelinating disease/multiple sclerosis.  No cortical or large vessel territory abnormality.  No mass lesion, hemorrhage, hydrocephalus or extra-axial collection. No pituitary mass.  No fluid in the sinuses, middle ears or  mastoids.  No skull or skull base lesion.  IMPRESSION: No acute infarction.  Areas of abnormal T2 and FLAIR signal within the cerebral hemispheric white matter that could indicate chronic small vessel change or other white matter processes such as demyelinating disease/multiple sclerosis.   Original Report Authenticated By: Paulina Fusi, M.D.   Carotid duplex completed.  Preliminary report: Bilateral: 1-39% ICA stenosis. However there a elevated velocities with turbulent flow throughout the carotid system etiology unknown. Bilateral vertebral artery flow is antegrade.  Assessment and plan discussed with with attending physician and they are in agreement.    Felicie Morn PA-C [facility] [phone]  12/23/2012, 3:22 PM   I have seen and evaluated the patient. I have reviewed the above note and made appropriate changes.   When questioned about previous episode similar to the current one she states that this is very similar to her typical migraine and with her typical migraines in the past she has had very similar symptoms of dizziness.   Assessment/Plan:  62 year old female with headache and dizziness that she states is identical to multiple previous episodes of migraine in the past. He does seem to be lasting longer than typical. Given this history, I feel that migraine is a likely diagnosis.  1) we'll treat for acute migraine with Compazine, Benadryl, Depakote, magnesium 2) given posterior symptoms, I would favor avoiding DHE 3) I will continue to follow.  Ritta Slot, MD [facility] [phone]  If 7pm- 7am, please page neurology on call at [phone].

## 2012-12-23 NOTE — Care Management Note (Addendum)
    Page 1 of 1   12/25/2012     2:46:17 PM   CARE MANAGEMENT NOTE 12/25/2012  Patient:  Sheri Ayala, Sheri Ayala   Account Number:  [id number]  Date Initiated:  12/23/2012  Documentation initiated by:  Calin Ellery  Subjective/Objective Assessment:   PT ADM ON 12/22/12 WITH PNA, VISION CHANGES.  PTA, PT LIVES ALONE.     Action/Plan:   WILL FOLLOW FOR HOME NEEDS AS PT PROGRESSES.   Anticipated DC Date:  12/26/2012   Anticipated DC Plan:  HOME W HOME HEALTH SERVICES      DC Planning Services  CM consult      Choice offered to / List presented to:             Status of service:  Completed, signed off Medicare Important Message given?   (If response is "NO", the following Medicare IM given date fields will be blank) Date Medicare IM given:   Date Additional Medicare IM given:    Discharge Disposition:  HOME/SELF CARE  Per UR Regulation:  Reviewed for med. necessity/level of care/duration of stay  If discussed at Long Length of Stay Meetings, dates discussed:    Comments:  12/25/12 Marney Treloar,RN,BSN [phone] PT FOR DC HOME TODAY.  NEEDS OP VESTIBULAR REHAB EVALUATION AND TX.  COMPLETED REFERRAL FORM; FAXED FORM,ORDER AND H&P TO [phone].  PT STATES SHE DOES HAVE INSURANCE--BCBS---THAT SHOULD COVER THIS REHAB.  STATES HAS NOT RECEIVED CARD YET FOR NEW INSURANCE.

## 2012-12-23 NOTE — Progress Notes (Signed)
  Echocardiogram 2D Echocardiogram has been performed.  Cathie Beams 12/23/2012, 12:27 PM

## 2012-12-23 NOTE — Progress Notes (Addendum)
VASCULAR LAB PRELIMINARY  PRELIMINARY  PRELIMINARY  PRELIMINARY  Carotid duplex completed.    Preliminary report:  Bilateral:  1-39% ICA stenosis. However there a elevated velocities with turbulent flow throughout the carotid system etiology unknown. Bilateral vertebral artery flow is antegrade.   Jazziel Fitzsimmons, RVS 12/23/2012, 3:01 PM

## 2012-12-23 NOTE — Progress Notes (Signed)
Comment:    Notified by RN that pt c/o blurred vision. Pt indicated to RN that symptom was new. Sheri Ayala is a 62 y.o. female with history of CLL who presented to the ED last evening because of increasing dizziness over the last 2 days. Pt had been given a Percocet for h/a just after arrival to floor and was initially thougt may be medication related. Pt continues to report blurred vision though she admits it has improved somewhat. At bedside pt noted AA&O x 3. She MAE's x 4 and has no UE or LE weakness or facial droop. PEARRL and EOMI. Upon questioning pt admits now that she has had the blurred vision for 2 days along with her dizziness and "feeling drunk".  She does admit that upon arrival to the floor during the night the blurred vision seemed to worsen but is not a new symptom. Discussed pt with Dr Toniann Fail who admitted pt. He does confirm that pt reported blurred vision at time of admission. Will get bil carotid dopplers today. Pt will be on neuro checks q2h x 4 hrs and Dr Toniann Fail will consult with neurology for further guidance. Discussed plan w/ pt who is agreeable. Will continue to monitor closely.   [name], NP-C [facility] Pager [phone]

## 2012-12-23 NOTE — Progress Notes (Signed)
INITIAL NUTRITION ASSESSMENT  DOCUMENTATION CODES Per approved criteria  -Underweight   INTERVENTION: 1. Ensure Complete po BID, each supplement provides 350 kcal and 13 grams of protein.   NUTRITION DIAGNOSIS: underweight related to chronic illness as evidenced by Body mass index is 16.47 kg/(m^2). Marland Kitchen   Goal: PO intake to meet >/=90% estimated nutrition needs  Monitor:  PO intake, weight trends, labs  Reason for Assessment: Malnutrition Screening Tool  62 y.o. female  Admitting Dx: Dizziness  ASSESSMENT: Pt admitted with dizziness. Pt has a hx of CLL.   Pt reports she is "always losing weight" but most of her weight loss was right after being dx with CLL. Pt states she is eating well. Has never tried oral nutrition supplements. Willing to try at this admission.   Weight hx shows weight stable from 113-115 lbs over the past 2 years.    Height: Ht Readings from Last 1 Encounters:  12/22/12 5\' 10"  (1.778 m)    Weight: Wt Readings from Last 1 Encounters:  12/22/12 114 lb 12.8 oz (52.073 kg)    Ideal Body Weight: 150 lbs   % Ideal Body Weight: 76%  Wt Readings from Last 10 Encounters:  12/22/12 114 lb 12.8 oz (52.073 kg)  04/28/11 115 lb (52.164 kg)  03/27/11 113 lb 6.4 oz (51.438 kg)  03/26/11 120 lb (54.432 kg)  03/25/11 120 lb (54.432 kg)  03/09/11 114 lb 6.7 oz (51.9 kg)  12/13/10 110 lb (49.896 kg)    Usual Body Weight: 120 lbs   % Usual Body Weight: 95%  BMI:  Body mass index is 16.47 kg/(m^2). Underweight   Estimated Nutritional Needs: Kcal: 1600-1820 Protein: 65-78 gm  Fluid: 1.6-1.8 L   Skin: intact   Diet Order: Cardiac  EDUCATION NEEDS: -No education needs identified at this time   Intake/Output Summary (Last 24 hours) at 12/23/12 1108 Last data filed at 12/23/12 0938  Gross per 24 hour  Intake    403 ml  Output      0 ml  Net    403 ml    Last BM: PTA    Labs:   Recent Labs Lab 12/22/12 1517 12/23/12 0437  NA 139 138   K 4.4 3.7  CL 102 103  CO2 30 28  BUN 18 19  CREATININE 0.57 0.46*  CALCIUM 9.0 8.2*  GLUCOSE 117* 93    CBG (last 3)  No results found for this basename: GLUCAP,  in the last 72 hours  Scheduled Meds: . budesonide (PULMICORT) nebulizer solution  0.25 mg Nebulization BID  . enoxaparin (LOVENOX) injection  40 mg Subcutaneous Q24H  . influenza vac split quadrivalent PF  0.5 mL Intramuscular Tomorrow-1000  . ipratropium  0.5 mg Nebulization Q6H  . levalbuterol  0.63 mg Nebulization Q6H  . levofloxacin (LEVAQUIN) IV  750 mg Intravenous Q24H  . pneumococcal 23 valent vaccine  0.5 mL Intramuscular Tomorrow-1000  . sodium chloride  3 mL Intravenous Q12H    Continuous Infusions: . sodium chloride 20 mL/hr at 12/23/12 0121    Past Medical History  Diagnosis Date  . Headache(784.0)   . Pleurisy   . Bronchospasm   . Chronic lymphocytic leukemia   . Pneumonia   . Thrombocytopenia   . Olecranon bursitis of left elbow November 2012.  . Tobacco abuse   . Frontal sinusitis November 2012  . COPD (chronic obstructive pulmonary disease)     Past Surgical History  Procedure Laterality Date  . Abdominal hysterectomy  Clarene Duke RD, LDN Pager [phone] After Hours pager [phone]

## 2012-12-24 DIAGNOSIS — R0602 Shortness of breath: Secondary | ICD-10-CM

## 2012-12-24 LAB — CBC WITH DIFFERENTIAL/PLATELET
Basophils Relative: 0 % (ref 0–1)
Eosinophils Relative: 0 % (ref 0–5)
Hemoglobin: 12.9 g/dL (ref 12.0–15.0)
MCH: 30.4 pg (ref 26.0–34.0)
MCV: 97.6 fL (ref 78.0–100.0)
Monocytes Absolute: 0.4 10*3/uL (ref 0.1–1.0)
Monocytes Relative: 2 % — ABNORMAL LOW (ref 3–12)
Neutrophils Relative %: 19 % — ABNORMAL LOW (ref 43–77)
RBC: 4.24 MIL/uL (ref 3.87–5.11)
WBC: 21.1 10*3/uL — ABNORMAL HIGH (ref 4.0–10.5)

## 2012-12-24 MED ORDER — PROCHLORPERAZINE EDISYLATE 5 MG/ML IJ SOLN
10.0000 mg | INTRAMUSCULAR | Status: AC
  Administered 2012-12-24: 10 mg via INTRAVENOUS
  Filled 2012-12-24: qty 2

## 2012-12-24 MED ORDER — DIPHENHYDRAMINE HCL 50 MG/ML IJ SOLN
12.5000 mg | Freq: Once | INTRAMUSCULAR | Status: AC
  Administered 2012-12-24: 12.5 mg via INTRAVENOUS
  Filled 2012-12-24: qty 1

## 2012-12-24 MED ORDER — LEVOFLOXACIN 750 MG PO TABS
750.0000 mg | ORAL_TABLET | ORAL | Status: DC
  Administered 2012-12-24: 750 mg via ORAL
  Filled 2012-12-24 (×2): qty 1

## 2012-12-24 NOTE — Progress Notes (Signed)
Occupational Therapy Treatment Patient Details Name: Sheri Ayala MRN: [id number] DOB: [birth year] Today's Date: 12/24/2012 Time: 1308-6578 OT Time Calculation (min): 42 min  OT Assessment / Plan / Recommendation  History of present illness Sheri Ayala is an 62 y.o. female with history of CLL who presented to the ED after waking up 2 days ago and "feeling drunk".  She states she was off balance and unable to steady herself in brith directions needing to hold onto the walls for assistance. She also noted her "vision was blurry when looking across the room but not at short distances".  When she would get up to walk she noted the room was moving from "right to left" but did not do so when she was still. While speaking to her, she denies any vertigo but does admit to continued blurred vision when looking across the room, in addition to vertical diplopia. She denies any numbness, tingling, dysarthria or dysphagia. When asked about the asterixis of her arms, she states this also is new.    OT comments  Pt further assessed. Pt c/o horizontal diplopia.  Noted to have difficulty maintaining conjugate gaze. L eye unable to maintain abduction for prolonged time. Used spot occlusion technique successfully and pt does not c/o diplopia or dizziness when wearing glasses. Pt able to ambulate safely in halls using occlusion technique and is able to read, which she was not able to do before using occlusion. Pt educated on "fusion" exercises. Pt may benefit from neuro opthamology consult. Discussed with Dr. Amada Jupiter. Will follow up in am  Follow Up Recommendations  Other (comment) (neuro opthamology)    Barriers to Discharge       Equipment Recommendations       Recommendations for Other Services    Frequency Min 2X/week   Progress towards OT Goals Progress towards OT goals: Progressing toward goals  Plan Discharge plan remains appropriate    Precautions / Restrictions Precautions Precautions:  Fall Restrictions Weight Bearing Restrictions: No   Pertinent Vitals/Pain no apparent distress     ADL  ADL Comments: mod I with use of spot occlusion    OT Diagnosis:    OT Problem List:   OT Treatment Interventions:     OT Goals(current goals can now be found in the care plan section)    Visit Information  Last OT Received On: 12/24/12 Assistance Needed: +1 History of Present Illness: Sheri Ayala is an 62 y.o. female with history of CLL who presented to the ED after waking up 2 days ago and "feeling drunk".  She states she was off balance and unable to steady herself in brith directions needing to hold onto the walls for assistance. She also noted her "vision was blurry when looking across the room but not at short distances".  When she would get up to walk she noted the room was moving from "right to left" but did not do so when she was still. While speaking to her, she denies any vertigo but does admit to continued blurred vision when looking across the room, in addition to vertical diplopia. She denies any numbness, tingling, dysarthria or dysphagia. When asked about the asterixis of her arms, she states this also is new.     Subjective Data      Prior Functioning  Home Living Family/patient expects to be discharged to:: Private residence Living Arrangements: Alone Available Help at Discharge: Family Type of Home: House Home Access: Stairs to enter [employer] of Steps: 2 Entrance  Stairs-Rails: Can reach both Home Layout: One level Home Equipment: Cane - single point Prior Function Level of Independence: Independent;Independent with assistive device(s)    Cognition  Cognition Arousal/Alertness: Awake/alert Behavior During Therapy: WFL for tasks assessed/performed Overall Cognitive Status: Within Functional Limits for tasks assessed (apears mildly impulsive)    Mobility  Bed Mobility Bed Mobility: Rolling Right;Rolling Left;Right Sidelying to Sit;Left  Sidelying to Sit;Sitting - Scoot to [place] of Bed Rolling Right: 7: Independent Rolling Left: 7: Independent Right Sidelying to Sit: 7: Independent Left Sidelying to Sit: 7: Independent Sitting - Scoot to Edge of Bed: 7: Independent Details for Bed Mobility Assistance: Various bed mobility aspects performed for Vestibular screening, some very slight eye movements going from right sidelying to sit position in direction to the left Transfers Transfers: Sit to Stand;Stand to Sit Sit to Stand: 7: Independent;Other (comment) (with use of spot occlusion patch) Stand to Sit: 7: Independent Details for Transfer Assistance: No assist needed, various surfaces from toilet and bed    Exercises  Other Exercises Other Exercises: emphasis on fusion exercises to improve conjugate gaze   Balance High Level Balance High Level Balance Activites: Side stepping;Backward walking;Direction changes;Turns;Head turns High Level Balance Comments: independent with navigating through confines areas, no assist needed   End of Session OT - End of Session Equipment Utilized During Treatment: Gait belt Activity Tolerance: Patient tolerated treatment well Patient left: in bed;with call bell/phone within reach;with bed alarm set Nurse Communication: Mobility status  GO     Carolyne Whitsel,HILLARY 12/24/2012, 4:58 PM [facility], OTR/L  [phone] 12/24/2012

## 2012-12-24 NOTE — Progress Notes (Signed)
Subjective: Some improvement in headache, but still there. Dizziness mildly better.  Tells me today that she has had migraines in the past, but not dizziness. The headache is typical for her, but not the dizziness.   Exam: Filed Vitals:   12/24/12 0527  BP: 120/55  Pulse: 81  Temp: 98.5 F (36.9 C)  Resp: 18   Gen: In bed, NAD MS: Awake, Alert UJ:WJXBJ, EOMI, nystagmus present on lateral gaze.  Motor: 5/5 throughout.  Sensory:intact to LT  Impression: 62 yo F with dizziness and vertigo in the setting of headache. This may represent complicated migraine. MRI is negative for cerebellar cva. CNS involvement of CLL is very rare an dI would favor treating her pneumonia and observation prior to considering lumbar puncture, especially if symptoms were not to worsen.   Recommendations: 1) Will continue to treat headaceh with ciompazine/benadryl.  2) Vestibular PT 3) will continue to follow.   Ritta Slot, MD [facility] [phone]  If 7pm- 7am, please page neurology on call at [phone].

## 2012-12-24 NOTE — Evaluation (Signed)
Physical Therapy Evaluation Patient Details Name: Sheri Ayala MRN: [id number] DOB: [birth year] Today's Date: 12/24/2012 Time: 4098-1191 PT Time Calculation (min): 21 min  PT Assessment / Plan / Recommendation History of Present Illness  Sheri Ayala is an 62 y.o. female with history of CLL who presented to the ED after waking up 2 days ago and "feeling drunk".  She states she was off balance and unable to steady herself in brith directions needing to hold onto the walls for assistance. She also noted her "vision was blurry when looking across the room but not at short distances".  When she would get up to walk she noted the room was moving from "right to left" but did not do so when she was still. While speaking to her, she denies any vertigo but does admit to continued blurred vision when looking across the room, in addition to vertical diplopia. She denies any numbness, tingling, dysarthria or dysphagia. When asked about the asterixis of her arms, she states this also is new.   Clinical Impression  Patient demonstrates good strength and functional mobility despite visual deficits and reported dizziness. Although no physical deficits seen upon screening, some of the common complaints may be consistent with vestibular dysfunction. Rec vestibular PT evaluation to rule out for sure. At this time feel patient would be more appropriate for vestibular evaluation. Of note, patient did report her symptoms are worse when its hot out.    PT Assessment  Patent does not need any further PT services    Follow Up Recommendations   (defer to vestibular evaluation)                   Precautions / Restrictions Restrictions Weight Bearing Restrictions: No   Pertinent Vitals/Pain No pain at this time      Mobility  Bed Mobility Bed Mobility: Rolling Right;Rolling Left;Right Sidelying to Sit;Left Sidelying to Sit;Sitting - Scoot to [place] of Bed Rolling Right: 7: Independent Rolling Left: 7:  Independent Right Sidelying to Sit: 7: Independent Left Sidelying to Sit: 7: Independent Sitting - Scoot to Edge of Bed: 7: Independent Details for Bed Mobility Assistance: Various bed mobility aspects performed for Vestibular screening, some very slight eye movements going from right sidelying to sit position in direction to the left Transfers Transfers: Sit to Stand;Stand to Sit Sit to Stand: 7: Independent Stand to Sit: 7: Independent Details for Transfer Assistance: No assist needed, various surfaces from toilet and bed Ambulation/Gait Ambulation/Gait Assistance: 7: Independent Ambulation Distance (Feet): 50 Feet Assistive device: None (managing IV pole independently at times) Ambulation/Gait Assistance Details: no assist needed, despite visual deficits patient is independent with mobility Gait Pattern: Within Functional Limits      PT Goals(Current goals can be found in the care plan section)    Visit Information  Last PT Received On: 12/24/12 Assistance Needed: +1 History of Present Illness: Sheri Ayala is an 62 y.o. female with history of CLL who presented to the ED after waking up 2 days ago and "feeling drunk".  She states she was off balance and unable to steady herself in brith directions needing to hold onto the walls for assistance. She also noted her "vision was blurry when looking across the room but not at short distances".  When she would get up to walk she noted the room was moving from "right to left" but did not do so when she was still. While speaking to her, she denies any vertigo but does admit to  continued blurred vision when looking across the room, in addition to vertical diplopia. She denies any numbness, tingling, dysarthria or dysphagia. When asked about the asterixis of her arms, she states this also is new.        Prior Functioning  Home Living Family/patient expects to be discharged to:: Private residence Living Arrangements: Alone Available Help at  Discharge: Family Type of Home: House Home Access: Stairs to enter Secretary/administrator of Steps: 2 Entrance Stairs-Rails: Can reach both Home Layout: One level Home Equipment: Cane - single point Prior Function Level of Independence: Independent;Independent with assistive device(s)    Cognition  Cognition Arousal/Alertness: Awake/alert Behavior During Therapy: WFL for tasks assessed/performed Overall Cognitive Status: Within Functional Limits for tasks assessed    Extremity/Trunk Assessment Upper Extremity Assessment Upper Extremity Assessment: Overall WFL for tasks assessed Lower Extremity Assessment Lower Extremity Assessment: Overall WFL for tasks assessed   Balance High Level Balance High Level Balance Activites: Side stepping;Backward walking;Direction changes;Turns;Head turns High Level Balance Comments: independent with navigating through confines areas, no assist needed  End of Session PT - End of Session Equipment Utilized During Treatment: Gait belt Activity Tolerance: Patient tolerated treatment well Patient left: in chair;with call bell/phone within reach Nurse Communication: Mobility status (may need vestibular orders)  GP     Fabio Asa 12/24/2012, 4:37 PM Charlotte Crumb, PT DPT  [phone]

## 2012-12-24 NOTE — Evaluation (Signed)
Occupational Therapy Evaluation Patient Details Name: Sheri Ayala MRN: [id number] DOB: 06-17-1950 Today's Date: 12/24/2012 Time: 1478-2956 OT Time Calculation (min): 21 min  OT Assessment / Plan / Recommendation History of present illness Sheri Ayala is an 62 y.o. female with history of CLL who presented to the ED after waking up 2 days ago and "feeling drunk".  She states she was off balance and unable to steady herself in brith directions needing to hold onto the walls for assistance. She also noted her "vision was blurry when looking across the room but not at short distances".  When she would get up to walk she noted the room was moving from "right to left" but did not do so when she was still. While speaking to her, she denies any vertigo but does admit to continued blurred vision when looking across the room, in addition to vertical diplopia. She denies any numbness, tingling, dysarthria or dysphagia. When asked about the asterixis of her arms, she states this also is new.    Clinical Impression   Pt admitted with above.  Pt continues to experience vision change, c/o blurrying and horizontal diplopia.  Pt requiring supervision for ambulation for safety due to vision but demonstrated no LOB.  Will assess vision further this afternoon. Will continue to follow acutely in order to address below problem list.    OT Assessment  Patient needs continued OT Services    Follow Up Recommendations   (neuro opthalmology)    Barriers to Discharge      Equipment Recommendations  None recommended by OT    Recommendations for Other Services    Frequency  Min 2X/week    Precautions / Restrictions Precautions Precautions: Fall Restrictions Weight Bearing Restrictions: No   Pertinent Vitals/Pain See vitals   ADL  Grooming: Performed;Wash/dry hands;Independent Where Assessed - Grooming: Unsupported standing Lower Body Dressing: Performed;Independent Where Assessed - Lower Body Dressing:  Unsupported sit to stand Toilet Transfer: Performed;Supervision/safety Toilet Transfer Method: Sit to Barista: Regular height toilet Toileting - Clothing Manipulation and Hygiene: Performed;Independent Where Assessed - Toileting Clothing Manipulation and Hygiene: Sit to stand from 3-in-1 or toilet Transfers/Ambulation Related to ADLs: supervision for safety with blurry vision ADL Comments: mod I with use of spot occlusion    OT Diagnosis: Disturbance of vision  OT Problem List: Impaired balance (sitting and/or standing);Impaired vision/perception OT Treatment Interventions: Patient/family education;Visual/perceptual remediation/compensation   OT Goals(Current goals can be found in the care plan section) Acute Rehab OT Goals Patient Stated Goal: to return home OT Goal Formulation: With patient Time For Goal Achievement: 12/31/12 Potential to Achieve Goals: Good  Visit Information  Last OT Received On: 12/24/12 Assistance Needed: +1 History of Present Illness: Sheri Ayala is an 62 y.o. female with history of CLL who presented to the ED after waking up 2 days ago and "feeling drunk".  She states she was off balance and unable to steady herself in brith directions needing to hold onto the walls for assistance. She also noted her "vision was blurry when looking across the room but not at short distances".  When she would get up to walk she noted the room was moving from "right to left" but did not do so when she was still. While speaking to her, she denies any vertigo but does admit to continued blurred vision when looking across the room, in addition to vertical diplopia. She denies any numbness, tingling, dysarthria or dysphagia. When asked about the asterixis of her  arms, she states this also is new.        Prior Functioning     Home Living Family/patient expects to be discharged to:: Private residence Living Arrangements: Alone Available Help at Discharge:  Family Type of Home: House Home Access: Stairs to enter Secretary/administrator of Steps: 2 Entrance Stairs-Rails: Can reach both Home Layout: One level Home Equipment: Cane - single point Prior Function Level of Independence: Independent;Independent with assistive device(s)         Vision/Perception Vision - History Baseline Vision: Wears glasses for distance only Patient Visual Report: Blurring of vision;Diplopia Vision - Assessment Vision Assessment: Vision impaired - to be further tested in functional context Additional Comments: Pt reporting horizontal diplopia.  Tracking [facility] but decreased smoothness with saccades. Will continue to assess.   Cognition  Cognition Arousal/Alertness: Awake/alert Behavior During Therapy: WFL for tasks assessed/performed Overall Cognitive Status: Within Functional Limits for tasks assessed    Extremity/Trunk Assessment Upper Extremity Assessment Upper Extremity Assessment: Overall WFL for tasks assessed Lower Extremity Assessment Lower Extremity Assessment: Overall WFL for tasks assessed     Mobility Bed Mobility Bed Mobility: Rolling Right;Rolling Left;Right Sidelying to Sit;Left Sidelying to Sit;Sitting - Scoot to [place] of Bed Rolling Right: 7: Independent Rolling Left: 7: Independent Right Sidelying to Sit: 7: Independent Left Sidelying to Sit: 7: Independent Sitting - Scoot to Edge of Bed: 7: Independent Details for Bed Mobility Assistance: Various bed mobility aspects performed for Vestibular screening, some very slight eye movements going from right sidelying to sit position in direction to the left Transfers Transfers: Sit to Stand;Stand to Sit Sit to Stand: 7: Independent Stand to Sit: 7: Independent Details for Transfer Assistance: No assist needed, various surfaces from toilet and bed     Exercise   Balance   End of Session OT - End of Session Equipment Utilized During Treatment: Gait belt Activity Tolerance: Patient  tolerated treatment well Patient left: in chair;with call bell/phone within reach Nurse Communication: Mobility status  GO    12/24/2012 Cipriano Mile OTR/L Pager [phone] Office [phone]  Cipriano Mile 12/24/2012, 5:12 PM

## 2012-12-24 NOTE — Progress Notes (Signed)
[facility]                                                                                Patient Demographics  Sheri Ayala, is a 62 y.o. female, DOB - 05/10/1950, WUJ:[id number]  Admit date - 12/22/2012   Admitting Physician Eduard Clos, MD  Outpatient Primary MD for the patient is DAVIS,JEROME, MD  LOS - 2   Chief Complaint  Patient presents with  . Dizziness        Assessment & Plan    1. Dizziness and Vertigo - neuro following, CT brain and MRI brain noted, nonspecific MRI findings, we'll defer further workup to neuro, currently no nystagmus on exam, patient feeling better, no episodes of vomiting or ringing in ears, will increase activity, PT to evaluate. If stable and okay by neurology we'll discharge home tomorrow.    2. Nonspecific findings on carotid duplex. We'll request outpatient followup with vascular post discharge.    3.CAP left lower lobe reactionary leukocytosis. Some pain at that site with coughing spells, she is improving clinically with empiric Levaquin continue. She will require repeat chest x-ray post discharge to document resolution. Patient has been informed.    4. History of COPD. No acute issues. Supportive care with oxygen nebulizer treatments as needed.    5. History of CLL. Case was discussed by Dr. Deno Etienne with hematology oncology on call, no acute interventions needed outpatient followup post discharge per them.    6. History of tobacco use. Counseled to quit.     Code Status: *Full  Family Communication: none present  Disposition Plan: Home   Procedures CT scan of head, CT angiogram chest, MRI head, echogram, carotid duplex,   Consults  Neurology   DVT Prophylaxis  Lovenox    Lab Results  Component Value Date   PLT 114* 12/24/2012    Medications  Scheduled Meds: . budesonide (PULMICORT) nebulizer solution  0.25 mg Nebulization BID  . enoxaparin (LOVENOX) injection  40 mg Subcutaneous Q24H  . feeding  supplement  237 mL Oral BID BM  . ipratropium  0.5 mg Nebulization Q6H  . levalbuterol  0.63 mg Nebulization Q6H  . levofloxacin (LEVAQUIN) IV  750 mg Intravenous Q24H  . sodium chloride  3 mL Intravenous Q12H   Continuous Infusions: . sodium chloride 20 mL/hr at 12/23/12 1626   PRN Meds:.acetaminophen, acetaminophen, levalbuterol, ondansetron (ZOFRAN) IV, ondansetron, prochlorperazine  Antibiotics   Anti-infectives   Start     Dose/Rate Route Frequency Ordered Stop   12/23/12 2000  levofloxacin (LEVAQUIN) IVPB 750 mg     750 mg 100 mL/hr over 90 Minutes Intravenous Every 24 hours 12/22/12 2123     12/22/12 1930  levofloxacin (LEVAQUIN) IVPB 750 mg     750 mg 100 mL/hr over 90 Minutes Intravenous  Once 12/22/12 1925 12/22/12 2134       Time Spent in minutes   35   Mounir Skipper K M.D on 12/24/2012 at 10:07 AM  Between 7am to 7pm - Pager - [phone]  After 7pm go to [email or web address] - password [email or web address]  And look for the night coverage person covering for me after hours  [facility] Office  [phone]    Subjective:   [name] today has, No headache, mild L sided chest pain with cough, No abdominal pain - No Nausea, No new weakness tingling or numbness, No Cough - SOB.    Objective:   Filed Vitals:   12/23/12 1425 12/23/12 2049 12/23/12 2058 12/24/12 0527  BP: 113/50 104/45  120/55  Pulse: 87 94  81  Temp: 98.6 F (37 C) 100.2 F (37.9 C)  98.5 F (36.9 C)  TempSrc: Oral Oral  Oral  Resp: 18 18  18   Height:      Weight:      SpO2: 95% 97% 95% 94%    Wt Readings from Last 3 Encounters:  12/22/12 52.073 kg (114 lb 12.8 oz)  04/28/11 52.164 kg (115 lb)  03/27/11 51.438 kg (113 lb 6.4 oz)     Intake/Output Summary (Last 24 hours) at 12/24/12 1007 Last data filed at 12/24/12 0802  Gross per 24 hour  Intake    460 ml  Output    300 ml  Net    160 ml    Exam Awake Alert, Oriented X 3, No new F.N deficits, Normal  affect Olar.AT,PERRAL Supple Neck,No JVD, No cervical lymphadenopathy appriciated.  Symmetrical Chest wall movement, Good air movement bilaterally, CTAB RRR,No Gallops,Rubs or new Murmurs, No Parasternal Heave +ve B.Sounds, Abd Soft, Non tender, No organomegaly appriciated, No rebound - guarding or rigidity. No Cyanosis, Clubbing or edema, No new Rash or bruise    Data Review   Micro Results No results found for this or any previous visit (from the past 240 hour(s)).  Radiology Reports Dg Chest 2 View  12/22/2012   *RADIOLOGY REPORT*  Clinical Data: Persistent cough for 2 weeks.  History of pneumonia. History of smoking, COPD.  Fever.  Dizziness.  CHEST - 2 VIEW  Comparison: 04/28/2011  Findings: Lungs are hyperinflated.  There is perihilar peribronchial thickening.  Prominent interstitial markings are favored to be chronic.  There are no focal consolidations or pleural effusions.  There has been some improvement in right lower lobe opacity with persistent density likely representing scarring. No pulmonary edema. Visualized osseous structures have a normal appearance.  IMPRESSION:  1.  COPD and bronchitic changes. 2.  Improvement in right lower lobe opacity, with residual probable scarring.   Original Report Authenticated By: Norva Pavlov, M.D.   Ct Head Wo Contrast  12/22/2012   *RADIOLOGY REPORT*  Clinical Data: Dizziness  CT HEAD WITHOUT CONTRAST  Technique:  Contiguous axial images were obtained from the base of the skull through the vertex without contrast. Study was obtained within 24 hours of patient arrival at the emergency department.  Comparison: December 13, 2010  Findings: The ventricles are normal in size and configuration. There is no mass, hemorrhage, extra-axial fluid collection, or midline shift.  There is mild small vessel disease in the anterior centra semiovale bilaterally, stable.  Elsewhere, gray-white compartments are normal.  No acute infarct is apparent.  Bony calvarium  appears intact.  The mastoid air cells are clear.  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 No demonstrable mass, hemorrhage, or acute appearing infarct.   Original Report Authenticated By: Bretta Bang, M.D.   Ct Angio Chest Pe W/cm &/or Wo Cm  12/23/2012   *RADIOLOGY REPORT*  Clinical Data: Evaluate for pulmonary embolus.  CT ANGIOGRAPHY CHEST  Technique:  Multidetector CT imaging of the chest using the standard protocol during bolus administration of intravenous contrast. Multiplanar reconstructed images including  MIPs were obtained and reviewed to evaluate the vascular anatomy.  Contrast: OMNIPAQUE IOHEXOL 350 MG/ML SOLN  Comparison: The none  Findings: Nodule in the right lower lobe is indeterminate measuring 1.9 cm, image 80/series 7.  Patchy areas of ground-glass attenuation are scattered throughout the left lower lobe.  Heart size appears normal.  There is no pericardial effusion identified. Enlarged mediastinal lymph nodes are noted.  Index subcarinal lymph node measures 1.5 cm.  Right paratracheal lymph node measures 1 cm. There is an AP window lymph node which measures 1 cm, image 40/series 5.  The main pulmonary artery appears patent no lobar or segmental pulmonary artery filling defects are identified.  No axillary or supraclavicular adenopathy identified.  Calcified nodule is identified within the left lobe of thyroid gland. Imaging through the upper abdomen shows no acute findings.  The visualized osseous structures are unremarkable.  IMPRESSION:  1.  No evidence for acute pulmonary embolus. 2.  Peripheral nodule in the right lung base is nonspecific.  This may be related to recent infection.  Follow-up imaging after appropriate antibiotic therapy is advised to ensure resolution.  If this does not resolve then a PET CT would be recommended to assess for pulmonary malignancy. 3.  Enlarged mediastinal lymph nodes may be related to the patient's history  of CLL. 4.  Patchy opacities within the left lower lobe are likely post inflammatory or infectious in etiology.   Original Report Authenticated By: Signa Kell, M.D.   Mr Brain Wo Contrast  12/22/2012   *RADIOLOGY REPORT*  Clinical Data: Cough.  Bronchitis.  Dizziness.  MRI HEAD WITHOUT CONTRAST  Technique:  Multiplanar, multiecho pulse sequences of the brain and surrounding structures were obtained according to standard protocol without intravenous contrast.  Comparison: Head CT same day  Findings: Diffusion imaging does not show any acute or subacute infarction.  The brainstem and cerebellum are normal.  Within the cerebral hemispheres, there are a few scattered foci of T2 and FLAIR signal within the deep and subcortical white matter.  The findings could represent chronic small vessel disease that could represent other processes such as demyelinating disease/multiple sclerosis.  No cortical or large vessel territory abnormality.  No mass lesion, hemorrhage, hydrocephalus or extra-axial collection. No pituitary mass.  No fluid in the sinuses, middle ears or mastoids.  No skull or skull base lesion.  IMPRESSION: No acute infarction.  Areas of abnormal T2 and FLAIR signal within the cerebral hemispheric white matter that could indicate chronic small vessel change or other white matter processes such as demyelinating disease/multiple sclerosis.   Original Report Authenticated By: Paulina Fusi, M.D.    CBC  Recent Labs Lab 12/22/12 1517 12/23/12 0437 12/24/12 0526  WBC 45.9* 26.6* 21.1*  HGB 15.4* 12.7 12.9  HCT 49.0* 40.0 41.4  PLT 174 127* 114*  MCV 96.6 96.6 97.6  MCH 30.4 30.7 30.4  MCHC 31.4 31.8 31.2  RDW 14.9 14.8 14.6  LYMPHSABS  --   --  16.7*  MONOABS  --   --  0.4  EOSABS  --   --  0.0  BASOSABS  --   --  0.0    Chemistries   Recent Labs Lab 12/22/12 1517 12/23/12 0437  NA 139 138  K 4.4 3.7  CL 102 103  CO2 30 28  GLUCOSE 117* 93  BUN 18 19  CREATININE 0.57 0.46*   CALCIUM 9.0 8.2*   ------------------------------------------------------------------------------------------------------------------ estimated creatinine clearance is 59.2 ml/min (by C-G formula based on Cr  of 0.46). ------------------------------------------------------------------------------------------------------------------ No results found for this basename: HGBA1C,  in the last 72 hours ------------------------------------------------------------------------------------------------------------------ No results found for this basename: CHOL, HDL, LDLCALC, TRIG, CHOLHDL, LDLDIRECT,  in the last 72 hours ------------------------------------------------------------------------------------------------------------------ No results found for this basename: TSH, T4TOTAL, FREET3, T3FREE, THYROIDAB,  in the last 72 hours ------------------------------------------------------------------------------------------------------------------ No results found for this basename: VITAMINB12, FOLATE, FERRITIN, TIBC, IRON, RETICCTPCT,  in the last 72 hours  Coagulation profile No results found for this basename: INR, PROTIME,  in the last 168 hours  No results found for this basename: DDIMER,  in the last 72 hours  Cardiac Enzymes No results found for this basename: CK, CKMB, TROPONINI, MYOGLOBIN,  in the last 168 hours ------------------------------------------------------------------------------------------------------------------ No components found with this basename: POCBNP,

## 2012-12-25 MED ORDER — INFLUENZA VAC SPLIT QUAD 0.5 ML IM SUSP
0.5000 mL | Freq: Once | INTRAMUSCULAR | Status: AC
  Administered 2012-12-25: 0.5 mL via INTRAMUSCULAR
  Filled 2012-12-25: qty 0.5

## 2012-12-25 MED ORDER — IBUPROFEN 600 MG PO TABS: 600.0000 mg | ORAL_TABLET | Freq: Three times a day (TID) | ORAL | Status: DC | PRN

## 2012-12-25 MED ORDER — PNEUMOCOCCAL VAC POLYVALENT 25 MCG/0.5ML IJ INJ
0.5000 mL | INJECTION | Freq: Once | INTRAMUSCULAR | Status: AC
  Administered 2012-12-25: 0.5 mL via INTRAMUSCULAR

## 2012-12-25 MED ORDER — ENSURE COMPLETE PO LIQD: 237.0000 mL | Freq: Two times a day (BID) | ORAL | Status: DC

## 2012-12-25 MED ORDER — LEVOFLOXACIN 750 MG PO TABS: 750.0000 mg | ORAL_TABLET | ORAL | Status: DC

## 2012-12-25 MED ORDER — SODIUM CHLORIDE 0.9 % IV SOLN: INTRAVENOUS | Status: DC

## 2012-12-25 NOTE — Progress Notes (Signed)
ANTIBIOTIC CONSULT NOTE - FOLLOW UP  Pharmacy Consult for Levaquin Indication: pneumonia  Allergies  Allergen Reactions  . Penicillins Hives and Nausea And Vomiting    Patient Measurements: Height: 5\' 10"  (177.8 cm) Weight: 114 lb 12.8 oz (52.073 kg) IBW/kg (Calculated) : 68.5   Vital Signs: Temp: 98.1 F (36.7 C) (09/11 0536) Temp src: Oral (09/11 0536) BP: 128/58 mmHg (09/11 0536) Pulse Rate: 62 (09/11 0536) Intake/Output from previous day: 09/10 0701 - 09/11 0700 In: 600 [P.O.:600] Out: -  Intake/Output from this shift:    Labs:  Recent Labs  12/22/12 1517 12/23/12 0437 12/24/12 0526  WBC 45.9* 26.6* 21.1*  HGB 15.4* 12.7 12.9  PLT 174 127* 114*  CREATININE 0.57 0.46*  --    Estimated Creatinine Clearance: 59.2 ml/min (by C-G formula based on Cr of 0.46).   Microbiology: No results found for this or any previous visit (from the past 720 hour(s)).  Anti-infectives   Start     Dose/Rate Route Frequency Ordered Stop   12/24/12 2000  levofloxacin (LEVAQUIN) tablet 750 mg     750 mg Oral Every 24 hours 12/24/12 1157     12/23/12 2000  levofloxacin (LEVAQUIN) IVPB 750 mg  Status:  Discontinued     750 mg 100 mL/hr over 90 Minutes Intravenous Every 24 hours 12/22/12 2123 12/24/12 1157   12/22/12 1930  levofloxacin (LEVAQUIN) IVPB 750 mg     750 mg 100 mL/hr over 90 Minutes Intravenous  Once 12/22/12 1925 12/22/12 2134      Assessment: 62 YOF with CLL who presented with dizziness and also found to have wheezing. CXR revealed possible PNA and patient was started on Levaquin. She was transitioned to PO Levaquin yesterday. Her renal function has remained stable. WBC still elevated but have decreased significantly since admission.   Goal of Therapy:  Eradication of infection and adequate dosing for renal function  Plan:  1. Continue Levaquin 750mg  PO daily 2. Follow up LOT, clinical progression and CBC  Tracey Hermance D. Antionette Luster, PharmD Clinical Pharmacist Pager:  [phone] 12/25/2012 11:15 AM

## 2012-12-25 NOTE — Progress Notes (Signed)
PT Note Evaluation completed and to be filed early pm.  Recommend Outpt PT for vestibular rehab.  MD:  Please give pt prescription.  Thanks. [facility] [phone] (pager)

## 2012-12-25 NOTE — Progress Notes (Signed)
Physical Therapy Evaluation Patient Details Name: Vear I Heyer MRN: [id number] DOB: Jan 08, 1951 Today's Date: 12/25/2012 Time: 1308-6578 PT Time Calculation (min): 30 min  PT Assessment / Plan / Recommendation History of Present Illness  Kinsie I Blevens is an 62 y.o. female with history of CLL who presented to the ED after waking up 2 days ago and "feeling drunk".  She states she was off balance and unable to steady herself in brith directions needing to hold onto the walls for assistance. She also noted her "vision was blurry when looking across the room but not at short distances".  When she would get up to walk she noted the room was moving from "right to left" but did not do so when she was still. While speaking to her, she denies any vertigo but does admit to continued blurred vision when looking across the room, in addition to vertical diplopia. She denies any numbness, tingling, dysarthria or dysphagia. When asked about the asterixis of her arms, she states this also is new.   Clinical Impression  Pt admitted with dizziness. Pt currently with functional limitations due to the deficits listed below (see PT Problem List). Pt positive for left vestibular hypofunction.  Will need Outpt PT f/u for vestibular rehab.  Pt will benefit from skilled PT to increase their independence and safety with mobility to allow discharge to the venue listed below.     PT Assessment  Patient needs continued PT services    Follow Up Recommendations  Outpatient PT (for vestibular rehab)                Equipment Recommendations  None recommended by PT         Frequency Min 2X/week    Precautions / Restrictions Precautions Precautions: None Restrictions Weight Bearing Restrictions: No   Pertinent Vitals/Pain VSS, no pain      Mobility  Bed Mobility Bed Mobility: Rolling Right;Right Sidelying to Sit Rolling Right: 7: Independent Right Sidelying to Sit: 7: Independent Details for Bed Mobility  Assistance: Modified Hallpike bil negative.  Positive head thrust left suggestive of left hypofunction.  Initiated x1 exercises. Transfers Transfers: Sit to Stand;Stand to Sit Sit to Stand: 7: Independent Stand to Sit: 7: Independent Ambulation/Gait Ambulation/Gait Assistance: 7: Independent Ambulation Distance (Feet): 50 Feet Assistive device: None Ambulation/Gait Assistance Details: Pt doing well with ambulation. Gait Pattern: Within Functional Limits Stairs: No Wheelchair Mobility Wheelchair Mobility: No    Exercises Other Exercises Other Exercises: Pt performed x1 exercises in sitting side to side and up and down.  Gave handouts.     PT Diagnosis: Other (comment);Generalized weakness (dizziness)  PT Problem List: Decreased activity tolerance;Decreased balance;Decreased mobility;Decreased knowledge of use of DME;Decreased safety awareness;Other (comment) (dizziness) PT Treatment Interventions: DME instruction;Functional mobility training;Therapeutic activities;Therapeutic exercise;Balance training;Patient/family education;Other (comment) (gaze stability exercise)     PT Goals(Current goals can be found in the care plan section) Acute Rehab PT Goals Patient Stated Goal: to return home PT Goal Formulation: With patient Time For Goal Achievement: 01/01/13 Potential to Achieve Goals: Good  Visit Information  Last PT Received On: 12/25/12 Assistance Needed: +1 History of Present Illness: Chauncey I Bittinger is an 62 y.o. female with history of CLL who presented to the ED after waking up 2 days ago and "feeling drunk".  She states she was off balance and unable to steady herself in brith directions needing to hold onto the walls for assistance. She also noted her "vision was blurry when looking across the room but  not at short distances".  When she would get up to walk she noted the room was moving from "right to left" but did not do so when she was still. While speaking to her, she denies any  vertigo but does admit to continued blurred vision when looking across the room, in addition to vertical diplopia. She denies any numbness, tingling, dysarthria or dysphagia. When asked about the asterixis of her arms, she states this also is new.        Prior Functioning  Home Living Family/patient expects to be discharged to:: Private residence Living Arrangements: Alone Available Help at Discharge: Family Type of Home: House Home Access: Stairs to enter Secretary/administrator of Steps: 2 Entrance Stairs-Rails: Can reach both Home Layout: One level Home Equipment: Cane - single point Prior Function Level of Independence: Independent;Independent with assistive device(s)    Cognition  Cognition Arousal/Alertness: Awake/alert Behavior During Therapy: WFL for tasks assessed/performed Overall Cognitive Status: Within Functional Limits for tasks assessed    Extremity/Trunk Assessment Upper Extremity Assessment Upper Extremity Assessment: Defer to OT evaluation Lower Extremity Assessment Lower Extremity Assessment: Generalized weakness   Balance    End of Session PT - End of Session Equipment Utilized During Treatment: Gait belt Activity Tolerance: Patient tolerated treatment well Patient left: in bed;with call bell/phone within reach Nurse Communication: Mobility status       INGOLD,Levar Fayson 12/25/2012, 1:40 PM [facility] [phone] (pager)

## 2012-12-25 NOTE — Progress Notes (Signed)
NEURO HOSPITALIST PROGRESS NOTE   SUBJECTIVE:                                                                                                                        Stated that she is doing remarkably better. Some blurred vision but otherwise denies double vision,vertigo, dysequilibrium, weakness, numbness, slurred speech, language or vision impairment. MRI-DWI showed no acute intracranial abnormality of striking abnormalities that could explain her symptoms.  TTE unremarkable. CUS: no hemodynamically significant carotid disease. Elevated velocities withturbulent flow bilateral with no evidence of significant plaque noted through out the complete carotid. Etiology not known.   OBJECTIVE:                                                                                                                           Vital signs in last 24 hours: Temp:  [98.1 F (36.7 C)-98.8 F (37.1 C)] 98.1 F (36.7 C) (09/11 0536) Pulse Rate:  [62-77] 62 (09/11 0536) Resp:  [16-20] 16 (09/11 0536) BP: (106-128)/(45-58) 128/58 mmHg (09/11 0536) SpO2:  [93 %-95 %] 95 % (09/11 0536)  Intake/Output from previous day: 09/10 0701 - 09/11 0700 In: 600 [P.O.:600] Out: -  Intake/Output this shift:   Nutritional status: Cardiac  Past Medical History  Diagnosis Date  . Headache(784.0)   . Pleurisy   . Bronchospasm   . Chronic lymphocytic leukemia   . Pneumonia   . Thrombocytopenia   . Olecranon bursitis of left elbow November 2012.  . Tobacco abuse   . Frontal sinusitis November 2012  . COPD (chronic obstructive pulmonary disease)     Neurologic Exam:  Mental Status: Alert, oriented, thought content appropriate.  Speech fluent without evidence of aphasia.  Able to follow 3 step commands without difficulty. Cranial Nerves: II: Discs flat bilaterally; Visual fields grossly normal, pupils equal, round, reactive to light and accommodation III,IV, VI: ptosis not  present, extra-ocular motions intact bilaterally V,VII: smile symmetric, facial light touch sensation normal bilaterally VIII: hearing normal bilaterally IX,X: gag reflex present XI: bilateral shoulder shrug XII: midline tongue extension Motor: Right : Upper extremity   5/5    Left:     Upper extremity   5/5  Lower extremity   5/5  Lower extremity   5/5 Tone and bulk:normal tone throughout; no atrophy noted Sensory: Pinprick and light touch intact throughout, bilaterally Deep Tendon Reflexes:  1+ all over Plantars: Right: downgoing   Left: downgoing Cerebellar: normal finger-to-nose,  normal heel-to-shin test Gait:  No ataxia. CV: pulses palpable throughout    Lab Results: No results found for this basename: cbc, bmp, coags, chol, tri, ldl, hga1c   Lipid Panel No results found for this basename: CHOL, TRIG, HDL, CHOLHDL, VLDL, LDLCALC,  in the last 72 hours  Studies/Results: No results found.  MEDICATIONS                                                                                                                       I have reviewed the patient's current medications.  ASSESSMENT/PLAN:                                                                                                           Vestibular migraine?. She is doing significantly better, has a normal, has a normal neuro-exam, and her MRI brain is unimpressive ( I think the white mater changes are most likely consistent with small vessel disease). Carotid ultrasound showed no evidence of hemodynamically significant carotid disease but turbulent flow of unclear etiology. This finding can be further evaluated with CTA neck as outpatient as at this moment she has not symptoms referable to the anterior circulation. Recommend outpatient neurology follow up in 2 weeks or returning to the ED if her symptoms recur.   Wyatt Portela, MD [facility] [phone]  12/25/2012, 8:46 AM

## 2012-12-25 NOTE — Progress Notes (Signed)
Occupational Therapy Treatment Patient Details Name: Sheri Ayala MRN: [id number] DOB: May 01, 1950 Today's Date: 12/25/2012 Time: 4098-1191 OT Time Calculation (min): 11 min  OT Assessment / Plan / Recommendation  History of present illness Sheri Ayala is an 62 y.o. female with history of CLL who presented to the ED after waking up 2 days ago and "feeling drunk".  She states she was off balance and unable to steady herself in brith directions needing to hold onto the walls for assistance. She also noted her "vision was blurry when looking across the room but not at short distances".  When she would get up to walk she noted the room was moving from "right to left" but did not do so when she was still. While speaking to her, she denies any vertigo but does admit to continued blurred vision when looking across the room, in addition to vertical diplopia. She denies any numbness, tingling, dysarthria or dysphagia. When asked about the asterixis of her arms, she states this also is new.    OT comments  Pt states she had been performing fusion exercises throughout last night and wearing the glasses with the spot occlusion and reports that her diplopia is much better. Encouraged pt to conitnue with exercises and use glasses as needed. Rec for her to use shower chair when bathing for safety until she returns to baseline.  Follow Up Recommendations  Other (comment)    Barriers to Discharge       Equipment Recommendations  None recommended by OT    Recommendations for Other Services    Frequency Min 2X/week   Progress towards OT Goals Progress towards OT goals: Progressing toward goals  Plan Discharge plan remains appropriate    Precautions / Restrictions Precautions Precautions: None   Pertinent Vitals/Pain no apparent distress     ADL       OT Diagnosis:    OT Problem List:   OT Treatment Interventions:     OT Goals(current goals can now be found in the care plan section) Acute Rehab OT  Goals Patient Stated Goal: to return home OT Goal Formulation: With patient Time For Goal Achievement: 12/31/12 Potential to Achieve Goals: Good ADL Goals Additional ADL Goal #1: Pt will independently perform vision HEP 2x daily.  Visit Information  Last OT Received On: 12/25/12 Assistance Needed: +1 History of Present Illness: Sheri Ayala is an 62 y.o. female with history of CLL who presented to the ED after waking up 2 days ago and "feeling drunk".  She states she was off balance and unable to steady herself in brith directions needing to hold onto the walls for assistance. She also noted her "vision was blurry when looking across the room but not at short distances".  When she would get up to walk she noted the room was moving from "right to left" but did not do so when she was still. While speaking to her, she denies any vertigo but does admit to continued blurred vision when looking across the room, in addition to vertical diplopia. She denies any numbness, tingling, dysarthria or dysphagia. When asked about the asterixis of her arms, she states this also is new.     Subjective Data      Prior Functioning       Cognition  Cognition Arousal/Alertness: Awake/alert Behavior During Therapy: WFL for tasks assessed/performed Overall Cognitive Status: Within Functional Limits for tasks assessed    Mobility       Exercises  Other Exercises  Other Exercises: reviewed fusion exercisesand use of glasses for spot occlusion   Balance     End of Session OT - End of Session Equipment Utilized During Treatment: Gait belt Activity Tolerance: Patient tolerated treatment well Patient left: in bed;with call bell/phone within reach Nurse Communication: Mobility status  GO     Sheri Ayala,Sheri Ayala 12/25/2012, 12:53 PM [facility], OTR/L  [phone] 12/25/2012

## 2012-12-25 NOTE — Discharge Summary (Signed)
[facility]                                                                                   Sheri Ayala, is a 62 y.o. female  DOB 1951/04/08  MRN [id number].  Admission date:  12/22/2012  Admitting Physician  Eduard Clos, MD  Discharge Date:  12/25/2012   Primary MD  DAVIS,JEROME, MD  Admission Diagnosis  Dizziness [780.4] Oxygen dependent [V46.2] CAP (community acquired pneumonia) [486] COPD with acute exacerbation [491.21]  Discharge Diagnosis     Principal Problem:   Dizziness Active Problems:   CLL (chronic lymphocytic leukemia)   COPD with acute exacerbation   SOB (shortness of breath)      Past Medical History  Diagnosis Date  . Headache(784.0)   . Pleurisy   . Bronchospasm   . Chronic lymphocytic leukemia   . Pneumonia   . Thrombocytopenia   . Olecranon bursitis of left elbow November 2012.  . Tobacco abuse   . Frontal sinusitis November 2012  . COPD (chronic obstructive pulmonary disease)     Past Surgical History  Procedure Laterality Date  . Abdominal hysterectomy       Recommendations for primary care physician for things to follow:   Please make sure Patient follows with recommended neurologist and vascular surgeon   Discharge Diagnoses:   Principal Problem:   Dizziness Active Problems:   CLL (chronic lymphocytic leukemia)   COPD with acute exacerbation   SOB (shortness of breath)    Discharge Condition: stable   Discharge Medications     Medication List    STOP taking these medications       GOODY HEADACHE PO      TAKE these medications       feeding supplement Liqd  Take 237 mLs by mouth 2 (two) times daily between meals.     ibuprofen 600 MG tablet  Commonly known as:  ADVIL,MOTRIN  Take 1 tablet (600 mg total) by mouth every 8 (eight) hours as needed for pain (headache).     levofloxacin 750 MG tablet  Commonly known as:  LEVAQUIN  Take 1 tablet (750 mg total) by mouth daily.      neomycin-bacitracin-polymyxin ointment  Commonly known as:  NEOSPORIN  Apply 1 application topically daily as needed (for bug bites).         Diet and Activity recommendation: See Discharge Instructions below   Discharge Instructions     Follow with Primary MD DAVIS,JEROME, MD in 4 days   Get CBC, CMP, checked 4 days by Primary MD and again as instructed by your Primary MD. Get a 2 view Chest X ray done next visit..  Get Medicines reviewed and adjusted.  Please request your Prim.MD to go over all Hospital Tests and Procedure/Radiological results at the follow up, please get all Hospital records sent to your Prim MD by signing hospital release before you go home.  Activity: As tolerated with Full fall precautions use walker/cane & assistance as needed   Diet:  Heart Healthy  For Heart failure patients - Check your Weight same time everyday, if you gain over 2 pounds, or you develop in leg  swelling, experience more shortness of breath or chest pain, call your Primary MD immediately. Follow Cardiac Low Salt Diet and 1.8 lit/day fluid restriction.  Disposition Home    If you experience worsening of your admission symptoms, develop shortness of breath, life threatening emergency, suicidal or homicidal thoughts you must seek medical attention immediately by calling 911 or calling your MD immediately  if symptoms less severe.  You Must read complete instructions/literature along with all the possible adverse reactions/side effects for all the Medicines you take and that have been prescribed to you. Take any new Medicines after you have completely understood and accpet all the possible adverse reactions/side effects.   Do not drive and provide baby sitting services if your were admitted for syncope or siezures until you have seen by Primary MD or a Neurologist and advised to do so again.  Do not drive when taking Pain medications.    Do not take more than prescribed Pain, Sleep and  Anxiety Medications  Special Instructions: If you have smoked or chewed Tobacco  in the last 2 yrs please stop smoking, stop any regular Alcohol  and or any Recreational drug use.  Wear Seat belts while driving.   Please note  You were cared for by a hospitalist during your hospital stay. If you have any questions about your discharge medications or the care you received while you were in the hospital after you are discharged, you can call the unit and asked to speak with the hospitalist on call if the hospitalist that took care of you is not available. Once you are discharged, your primary care physician will handle any further medical issues. Please note that NO REFILLS for any discharge medications will be authorized once you are discharged, as it is imperative that you return to your primary care physician (or establish a relationship with a primary care physician if you do not have one) for your aftercare needs so that they can reassess your need for medications and monitor your lab values.        Follow-up Information   Follow up with DAVIS,JEROME, MD. Schedule an appointment as soon as possible for a visit in 4 days.   Specialty:  General Practice   Contact information:   [address] [place] [address] [phone]       Follow up with EARLY, TODD, MD. Schedule an appointment as soon as possible for a visit in 1 week.   Specialty:  Vascular Surgery   Contact information:   [address] [place] [address] [phone]       Follow up with [facility]. Schedule an appointment as soon as possible for a visit in 1 week.   Contact information:   [address] [place] [address] [phone]        Consults Neurology    History of present illness and  Hospital Course:     Kindly see H&P for history of present illness and admission details, please review complete Labs, Consult reports and Test reports for all details in brief  Sheri Ayala, is a 62 y.o. female, patient with history of CLL, COPD, smoking,Migraine headaches was admitted to the hospital with reoccurrence of migraine headaches along with some vertigo. She also had evidence of CAP on admission.   She was seen by neurology she underwent CT scan, MRI of the brain, carotid duplex and echo gram. I will request her primary care physician to kindly evaluate formal reports in detail as  she has several nonspecific findings. With supportive care she has shown remarkable improvement and is now close to being symptom-free, she will get outpatient physical therapy to help her with vertigo. Currently she has no nystagmus. She has some nonspecific MRI findings however no clear sign of a stroke or CVA. She has also been suggested to follow with neurologist outpatient.  Note on her carotid duplex there was no significant stenosis but increased turbulence of unclear significance for this problem I have requested her to follow with vascular surgery one time post discharge.   Patient does smoke and has been counseled to quit smoking. She has underlying COPD which is at baseline without any exacerbation.   For her pneumonia patient will get Levaquin for 5 more days will request PCP to repeat a two-view chest x-ray to document stability post treatment.    She has been requested to follow with her primary hematologist oncologist post discharge due to her underlying history of CLL.     Today   Subjective:   Anahlia Merkey today has no headache,no chest abdominal pain,no new weakness tingling or numbness, feels much better wants to go home today   Objective:   Blood pressure 128/58, pulse 62, temperature 98.1 F (36.7 C), temperature source Oral, resp. rate 16, height 5\' 10"  (1.778 m), weight 52.073 kg (114 lb 12.8 oz), SpO2 95.00%.   Intake/Output Summary (Last 24 hours) at 12/25/12 1138 Last data filed at 12/24/12 2200  Gross per 24 hour  Intake    480 ml  Output      0  ml  Net    480 ml    Exam Awake Alert, Oriented *3, No new F.N deficits, Normal affect .AT,PERRAL Supple Neck,No JVD, No cervical lymphadenopathy appriciated.  Symmetrical Chest wall movement, Good air movement bilaterally, CTAB RRR,No Gallops,Rubs or new Murmurs, No Parasternal Heave +ve B.Sounds, Abd Soft, Non tender, No organomegaly appriciated, No rebound -guarding or rigidity. No Cyanosis, Clubbing or edema, No new Rash or bruise  Data Review   Major procedures and Radiology Reports - PLEASE review detailed and final reports for all details, in brief -      Dg Chest 2 View  12/22/2012   *RADIOLOGY REPORT*  Clinical Data: Persistent cough for 2 weeks.  History of pneumonia. History of smoking, COPD.  Fever.  Dizziness.  CHEST - 2 VIEW  Comparison: 04/28/2011  Findings: Lungs are hyperinflated.  There is perihilar peribronchial thickening.  Prominent interstitial markings are favored to be chronic.  There are no focal consolidations or pleural effusions.  There has been some improvement in right lower lobe opacity with persistent density likely representing scarring. No pulmonary edema. Visualized osseous structures have a normal appearance.  IMPRESSION:  1.  COPD and bronchitic changes. 2.  Improvement in right lower lobe opacity, with residual probable scarring.   Original Report Authenticated By: Norva Pavlov, M.D.   Ct Head Wo Contrast  12/22/2012   *RADIOLOGY REPORT*  Clinical Data: Dizziness  CT HEAD WITHOUT CONTRAST  Technique:  Contiguous axial images were obtained from the base of the skull through the vertex without contrast. Study was obtained within 24 hours of patient arrival at the emergency department.  Comparison: December 13, 2010  Findings: The ventricles are normal in size and configuration. There is no mass, hemorrhage, extra-axial fluid collection, or midline shift.  There is mild small vessel disease in the anterior centra semiovale bilaterally, stable.  Elsewhere,  gray-white compartments are normal.  No acute infarct is  apparent.  Bony calvarium appears intact.  The mastoid air cells are clear.  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 No demonstrable mass, hemorrhage, or acute appearing infarct.   Original Report Authenticated By: Bretta Bang, M.D.   Ct Angio Chest Pe W/cm &/or Wo Cm  12/23/2012   *RADIOLOGY REPORT*  Clinical Data: Evaluate for pulmonary embolus.  CT ANGIOGRAPHY CHEST  Technique:  Multidetector CT imaging of the chest using the standard protocol during bolus administration of intravenous contrast. Multiplanar reconstructed images including MIPs were obtained and reviewed to evaluate the vascular anatomy.  Contrast: OMNIPAQUE IOHEXOL 350 MG/ML SOLN  Comparison: The none  Findings: Nodule in the right lower lobe is indeterminate measuring 1.9 cm, image 80/series 7.  Patchy areas of ground-glass attenuation are scattered throughout the left lower lobe.  Heart size appears normal.  There is no pericardial effusion identified. Enlarged mediastinal lymph nodes are noted.  Index subcarinal lymph node measures 1.5 cm.  Right paratracheal lymph node measures 1 cm. There is an AP window lymph node which measures 1 cm, image 40/series 5.  The main pulmonary artery appears patent no lobar or segmental pulmonary artery filling defects are identified.  No axillary or supraclavicular adenopathy identified.  Calcified nodule is identified within the left lobe of thyroid gland. Imaging through the upper abdomen shows no acute findings.  The visualized osseous structures are unremarkable.  IMPRESSION:  1.  No evidence for acute pulmonary embolus. 2.  Peripheral nodule in the right lung base is nonspecific.  This may be related to recent infection.  Follow-up imaging after appropriate antibiotic therapy is advised to ensure resolution.  If this does not resolve then a PET CT would be recommended to assess for pulmonary  malignancy. 3.  Enlarged mediastinal lymph nodes may be related to the patient's history of CLL. 4.  Patchy opacities within the left lower lobe are likely post inflammatory or infectious in etiology.   Original Report Authenticated By: Signa Kell, M.D.   Mr Brain Wo Contrast  12/22/2012   *RADIOLOGY REPORT*  Clinical Data: Cough.  Bronchitis.  Dizziness.  MRI HEAD WITHOUT CONTRAST  Technique:  Multiplanar, multiecho pulse sequences of the brain and surrounding structures were obtained according to standard protocol without intravenous contrast.  Comparison: Head CT same day  Findings: Diffusion imaging does not show any acute or subacute infarction.  The brainstem and cerebellum are normal.  Within the cerebral hemispheres, there are a few scattered foci of T2 and FLAIR signal within the deep and subcortical white matter.  The findings could represent chronic small vessel disease that could represent other processes such as demyelinating disease/multiple sclerosis.  No cortical or large vessel territory abnormality.  No mass lesion, hemorrhage, hydrocephalus or extra-axial collection. No pituitary mass.  No fluid in the sinuses, middle ears or mastoids.  No skull or skull base lesion.  IMPRESSION: No acute infarction.  Areas of abnormal T2 and FLAIR signal within the cerebral hemispheric white matter that could indicate chronic small vessel change or other white matter processes such as demyelinating disease/multiple sclerosis.   Original Report Authenticated By: Paulina Fusi, M.D.    Micro Results      No results found for this or any previous visit (from the past 240 hour(s)).   CBC w Diff: Lab Results  Component Value Date   WBC 21.1* 12/24/2012   WBC 37.1* 04/20/2008   HGB 12.9 12/24/2012   HGB 14.0 04/20/2008   HCT 41.4  12/24/2012   HCT 42.0 04/20/2008   PLT 114* 12/24/2012   PLT 127* 04/20/2008   LYMPHOPCT 79* 12/24/2012   LYMPHOPCT 83.6* 04/20/2008   MONOPCT 2* 12/24/2012   MONOPCT 2.4 04/20/2008    EOSPCT 0 12/24/2012   EOSPCT 0.1 04/20/2008   BASOPCT 0 12/24/2012   BASOPCT 0.1 04/20/2008    CMP: Lab Results  Component Value Date   NA 138 12/23/2012   K 3.7 12/23/2012   CL 103 12/23/2012   CO2 28 12/23/2012   BUN 19 12/23/2012   CREATININE 0.46* 12/23/2012   PROT 5.4* 03/28/2011   ALBUMIN 2.9* 03/28/2011   BILITOT 0.3 03/28/2011   ALKPHOS 47 03/28/2011   AST 11 03/28/2011   ALT 6 03/28/2011  .   Total Time in preparing paper work, data evaluation and todays exam - 35 minutes  Leroy Sea M.D on 12/25/2012 at 11:38 AM  [facility] Office  [phone]

## 2012-12-25 NOTE — Progress Notes (Signed)
Pt was discharged per MD order. Pt was alert and oriented at discharge with no complaints of pain. Pt verbalized discharge teaching. Pt was escorted to private vehicle VIA wheelchair by volunteer guest services. Ilean Skill, Brek Reece R, RN

## 2012-12-30 NOTE — ED Provider Notes (Signed)
Medical screening examination/treatment/procedure(s) were performed by non-physician practitioner and as supervising physician I was immediately available for consultation/collaboration.   Layla Maw Kelsen Celona, DO 12/30/12 [phone]

## 2013-01-26 ENCOUNTER — Encounter: Payer: Self-pay | Admitting: Vascular Surgery

## 2013-01-27 ENCOUNTER — Encounter: Payer: Self-pay | Admitting: Vascular Surgery

## 2013-06-21 ENCOUNTER — Emergency Department (HOSPITAL_COMMUNITY)
Admission: EM | Admit: 2013-06-21 | Discharge: 2013-06-21 | Disposition: A | Discharge status: Home / Self Care | Payer: Self-pay | Attending: Emergency Medicine | Admitting: Emergency Medicine

## 2013-06-21 ENCOUNTER — Emergency Department (HOSPITAL_COMMUNITY): Payer: Self-pay

## 2013-06-21 ENCOUNTER — Encounter (HOSPITAL_COMMUNITY): Payer: Self-pay | Admitting: Emergency Medicine

## 2013-06-21 DIAGNOSIS — Z8701 Personal history of pneumonia (recurrent): Secondary | ICD-10-CM | POA: Insufficient documentation

## 2013-06-21 DIAGNOSIS — Z79899 Other long term (current) drug therapy: Secondary | ICD-10-CM | POA: Insufficient documentation

## 2013-06-21 DIAGNOSIS — Z880 Allergy status to penicillin: Secondary | ICD-10-CM | POA: Insufficient documentation

## 2013-06-21 DIAGNOSIS — F17200 Nicotine dependence, unspecified, uncomplicated: Secondary | ICD-10-CM | POA: Insufficient documentation

## 2013-06-21 DIAGNOSIS — J449 Chronic obstructive pulmonary disease, unspecified: Secondary | ICD-10-CM | POA: Insufficient documentation

## 2013-06-21 DIAGNOSIS — S2231XA Fracture of one rib, right side, initial encounter for closed fracture: Secondary | ICD-10-CM

## 2013-06-21 DIAGNOSIS — S2239XA Fracture of one rib, unspecified side, initial encounter for closed fracture: Secondary | ICD-10-CM | POA: Insufficient documentation

## 2013-06-21 DIAGNOSIS — J4489 Other specified chronic obstructive pulmonary disease: Secondary | ICD-10-CM | POA: Insufficient documentation

## 2013-06-21 DIAGNOSIS — X500XXA Overexertion from strenuous movement or load, initial encounter: Secondary | ICD-10-CM | POA: Insufficient documentation

## 2013-06-21 DIAGNOSIS — Y929 Unspecified place or not applicable: Secondary | ICD-10-CM | POA: Insufficient documentation

## 2013-06-21 DIAGNOSIS — Z856 Personal history of leukemia: Secondary | ICD-10-CM | POA: Insufficient documentation

## 2013-06-21 DIAGNOSIS — Z862 Personal history of diseases of the blood and blood-forming organs and certain disorders involving the immune mechanism: Secondary | ICD-10-CM | POA: Insufficient documentation

## 2013-06-21 DIAGNOSIS — Y9389 Activity, other specified: Secondary | ICD-10-CM | POA: Insufficient documentation

## 2013-06-21 DIAGNOSIS — Z8739 Personal history of other diseases of the musculoskeletal system and connective tissue: Secondary | ICD-10-CM | POA: Insufficient documentation

## 2013-06-21 HISTORY — DX: Unspecified asthma, uncomplicated: J45.909

## 2013-06-21 MED ORDER — HYDROCODONE-ACETAMINOPHEN 5-325 MG PO TABS
1.0000 | ORAL_TABLET | Freq: Once | ORAL | Status: AC
  Administered 2013-06-21: 1 via ORAL
  Filled 2013-06-21: qty 1

## 2013-06-21 MED ORDER — OXYCODONE-ACETAMINOPHEN 5-325 MG PO TABS: ORAL_TABLET | ORAL | Status: DC

## 2013-06-21 MED ORDER — ALBUTEROL SULFATE (2.5 MG/3ML) 0.083% IN NEBU: 2.5000 mg | INHALATION_SOLUTION | RESPIRATORY_TRACT | Status: DC | PRN

## 2013-06-21 MED ORDER — ALBUTEROL SULFATE (2.5 MG/3ML) 0.083% IN NEBU
2.5000 mg | INHALATION_SOLUTION | Freq: Once | RESPIRATORY_TRACT | Status: AC
  Administered 2013-06-21: 2.5 mg via RESPIRATORY_TRACT
  Filled 2013-06-21: qty 3

## 2013-06-21 MED ORDER — IPRATROPIUM-ALBUTEROL 0.5-2.5 (3) MG/3ML IN SOLN
3.0000 mL | Freq: Once | RESPIRATORY_TRACT | Status: AC
  Administered 2013-06-21: 3 mL via RESPIRATORY_TRACT
  Filled 2013-06-21: qty 3

## 2013-06-21 MED ORDER — MORPHINE SULFATE 4 MG/ML IJ SOLN
4.0000 mg | Freq: Once | INTRAMUSCULAR | Status: AC
  Administered 2013-06-21: 4 mg via INTRAMUSCULAR
  Filled 2013-06-21: qty 1

## 2013-06-21 NOTE — ED Notes (Signed)
Patient c/o right rib pain. Per patient was cleaning tub on her knees when she reached for bottles across tub pressing ribs into bathtub. Per patient felt a "pop." Per patient pain since and some shortness of breath. Patient O2 sat 87% in triage.

## 2013-06-21 NOTE — ED Notes (Signed)
Patient with no complaints at this time. Respirations even and unlabored. Skin warm/dry. Discharge instructions reviewed with patient at this time. Patient given opportunity to voice concerns/ask questions. Patient discharged at this time and left Emergency Department with steady gait.   

## 2013-06-21 NOTE — ED Notes (Signed)
Placed pt back on 2 liters of 02.

## 2013-06-21 NOTE — ED Provider Notes (Signed)
CSN: [id number]     Arrival date & time 06/21/13  1513 History   First MD Initiated Contact with Patient 06/21/13 1555     Chief Complaint  Patient presents with  . rib pain      HPI Pt was seen at 1620. Per pt, c/o sudden onset and persistence of constant right rib "pain" that began 3 days ago. Pt states she was on her knees cleaning a tub, when she reached across the tub with her right arm, leaning on her right ribs, and "heard a pop." Pt states she "thinks I might have broke a rib." Denies any other injuries, no rash, no SOB/cough, no CP/palpitations, no abd pain, no N/V/D.     Past Medical History  Diagnosis Date  . Headache(784.0)   . Pleurisy   . Bronchospasm   . Chronic lymphocytic leukemia   . Pneumonia   . Thrombocytopenia   . Olecranon bursitis of left elbow November 2012.  . Tobacco abuse   . Frontal sinusitis November 2012  . COPD (chronic obstructive pulmonary disease)   . Asthma    Past Surgical History  Procedure Laterality Date  . Abdominal hysterectomy     Family History  Problem Relation Age of Onset  . Aneurysm Mother    History  Substance Use Topics  . Smoking status: Current Every Day Smoker -- 1.00 packs/day for 45 years    Types: Cigarettes  . Smokeless tobacco: Never Used  . Alcohol Use: No   OB History   Grav Para Term Preterm Abortions TAB SAB Ect Mult Living   2 2 2       2      Review of Systems ROS: Statement: All systems negative except as marked or noted in the HPI; Constitutional: Negative for fever and chills. ; ; Eyes: Negative for eye pain, redness and discharge. ; ; ENMT: Negative for ear pain, hoarseness, nasal congestion, sinus pressure and sore throat. ; ; Cardiovascular: Negative for chest pain, palpitations, diaphoresis, dyspnea and peripheral edema. ; ; Respiratory: Negative for cough, wheezing and stridor. ; ; Gastrointestinal: Negative for nausea, vomiting, diarrhea, abdominal pain, blood in stool, hematemesis, jaundice and  rectal bleeding. . ; ; Genitourinary: Negative for dysuria, flank pain and hematuria. ; ; Musculoskeletal: +right ribs pain. Negative for back pain and neck pain. Negative for swelling and trauma.; ; Skin: Negative for pruritus, rash, abrasions, blisters, bruising and skin lesion.; ; Neuro: Negative for headache, lightheadedness and neck stiffness. Negative for weakness, altered level of consciousness , altered mental status, extremity weakness, paresthesias, involuntary movement, seizure and syncope.     Allergies  Penicillins  Home Medications   Current Outpatient Rx  Name  Route  Sig  Dispense  Refill  . Aspirin-Acetaminophen-Caffeine (GOODY HEADACHE PO)   Oral   Take 1 packet by mouth daily as needed (for pain).         Marland Kitchen albuterol (PROVENTIL) (2.5 MG/3ML) 0.083% nebulizer solution   Nebulization   Take 2.5 mg by nebulization every 6 (six) hours as needed for wheezing or shortness of breath. Pt ran out          BP 122/49  Pulse 86  Temp(Src) 98.4 F (36.9 C) (Oral)  Resp 14  Ht 5\' 10"  (1.778 m)  Wt 110 lb (49.896 kg)  BMI 15.78 kg/m2  SpO2 92% Physical Exam 1625: Physical examination:  Nursing notes reviewed; Vital signs and O2 SAT reviewed;  Constitutional: Well developed, Well nourished, Well hydrated, In no acute  distress; Head:  Normocephalic, atraumatic; Eyes: EOMI, PERRL, No scleral icterus; ENMT: Mouth and pharynx normal, Mucous membranes moist; Neck: Supple, Full range of motion, No lymphadenopathy; Cardiovascular: Regular rate and rhythm, No gallop; Respiratory: Breath sounds clear & equal bilaterally, No wheezes.  Speaking full sentences with ease, Normal respiratory effort/excursion; Chest: +right chest wall with mild tenderness to palp. No soft tissue crepitus, no erythema, no ecchymosis, no deformity. Movement normal; Abdomen: Soft, Nontender, Nondistended, Normal bowel sounds; Genitourinary: No CVA tenderness; Extremities: Pulses normal, No tenderness, No edema, No  calf edema or asymmetry.; Neuro: AA&Ox3, Major CN grossly intact.  Speech clear. No gross focal motor or sensory deficits in extremities. Climbs on and off stretcher easily by herself. Gait steady.; Skin: Color normal, Warm, Dry.   ED Course  Procedures     EKG Interpretation None      MDM  MDM Reviewed: previous chart, nursing note and vitals Interpretation: x-ray    Dg Ribs Unilateral W/chest Right 06/21/2013   CLINICAL DATA:  Right anterior rib pain after hitting chest against a bathtub today.  EXAM: RIGHT RIBS AND CHEST - 3+ VIEW  COMPARISON:  12/22/2012  FINDINGS: There is a nondisplaced fracture of the anterior rib end of the right ninth rib. No other fractures.  There is scarring in both lung apices. Lungs are hyperexpanded with mild diffuse interstitial thickening. No lung contusion/consolidation. No pleural effusion or pneumothorax.  IMPRESSION: 1. Nondisplaced fracture of the anterior rib end of the right ninth red. No fracture complication. 2. COPD.  No acute findings in the lungs.   Electronically Signed   By: Lajean Manes M.D.   On: 06/21/2013 16:32     1845:  Pt states she feels "much better now" after neb and pain meds. Lungs continue coarse, Sats 92% R/A, resps easy, NAD. Pt has gotten herself dressed and is sitting on the side of her stretcher requesting to be discharged. Pt requesting refill of her albuterol neb solution; will refill. States she already has an IS at home and described how to use it to myself and the RT. Dx and testing d/w pt and family.  Questions answered.  Verb understanding, agreeable to d/c home with outpt f/u.       Alfonzo Feller, DO 06/24/13 1327

## 2013-06-21 NOTE — ED Notes (Signed)
EDP aware of 02 sat on room air, neb treatment ordered.

## 2013-06-21 NOTE — ED Notes (Signed)
Pt reports was leaning over the side of a bathtub 2 days ago and felt something pop in r ribs.  C/O pain that is worse with breathing and movement since then.  No obvious bruising.  Pt's room air 02 sat in 80's.  Pt denies SOB but says hurts to breathe.  Breath sounds audible.  Pt placed on 02 at 2liters and xray ordered.

## 2013-06-21 NOTE — Discharge Instructions (Signed)
°Emergency Department Resource Guide °1) Find a Doctor and Pay Out of Pocket °Although you won't have to find out who is covered by your insurance plan, it is a good idea to ask around and get recommendations. You will then need to call the office and see if the doctor you have chosen will accept you as a new patient and what types of options they offer for patients who are self-pay. Some doctors offer discounts or will set up payment plans for their patients who do not have insurance, but you will need to ask so you aren't surprised when you get to your appointment. ° °2) Contact Your Local Health Department °Not all health departments have doctors that can see patients for sick visits, but many do, so it is worth a call to see if yours does. If you don't know where your local health department is, you can check in your phone book. The CDC also has a tool to help you locate your state's health department, and many state websites also have listings of all of their local health departments. ° °3) Find a Walk-in Clinic °If your illness is not likely to be very severe or complicated, you may want to try a walk in clinic. These are popping up all over the country in pharmacies, drugstores, and shopping centers. They're usually staffed by nurse practitioners or physician assistants that have been trained to treat common illnesses and complaints. They're usually fairly quick and inexpensive. However, if you have serious medical issues or chronic medical problems, these are probably not your best option. ° °No Primary Care Doctor: °- Call [facility] at  832-8000 - they can help you locate a primary care doctor that  accepts your insurance, provides certain services, etc. °- Physician Referral Service- 1-800-533-3463 ° °Chronic Pain Problems: °Organization         Address  Phone   Notes  ° [facility]  (336) 297-2271 Patients need to be referred by their primary care doctor.  ° °Medication  Assistance: °Organization         Address  Phone   Notes  °[place] [facility] [address] °[place], [place] [address] (336) 641-8030 --Must be a resident of [place] °-- Must have NO insurance coverage whatsoever (no Medicaid/ Medicare, etc.) °-- The pt. MUST have a primary care doctor that directs their care regularly and follows them in the community °  °[facility]  (866) 331-1348   °[employer]  (888) 892-1162   ° °Agencies that provide inexpensive medical care: °Organization         Address  Phone   Notes  °[facility]  (336) 832-8035   °[facility]    (336) 832-7272   °[facility] [address] °[place], [place] [address] (336) 832-4777   °[facility] [address], °[place] (336) 271-4999   °[facility]    (336) 373-0678   °[facility]    (336) 272-1050   °[facility] ° [address], [place] Phone:  (336) 832-4444, Fax:  (336) 832-4440 Hours of Operation:  9 am - 6 pm, M-F.  Also accepts Medicaid/Medicare and self-pay.  °[facility] ° [address],  Phone: (336) 832-3150, Fax: (336) [phone]. Hours of Operation:  8:30 am - 5:30 pm, M-F.  Also accepts Medicaid and self-pay.  °[facility] [place] [address], [place] Phone: (336) 878-6027   °[facility] [address], [place], [place] (336)723-1848, Ext. 123 Mondays & Thursdays: 7-9 AM.  First 15 patients are seen on a first come, first serve basis. °  ° °Medicaid-accepting [place] Providers: ° °Organization         Address  Phone   Notes  °[facility] [address], [place] (336) 641-2100 Also accepts self-pay patients.  °[facility] [address], [place] ° (336) 856-9996   °[facility] [address], [place]  (336) 288-8857   °[facility] [address] [place] [address], [place] (336) 299-7000   °Veita Bland [address], [place]  ° (336) 373-1557 Only accepts [place] Access Medicaid patients after they have their name applied to their card.  ° °Self-Pay (no insurance) in [place]: ° °Organization         Address  Phone   Notes  °Sickle Cell Patients, [facility] [address], [place] (336) 832-1970   °[facility] [address], [place] (336) 832-4400   °[facility] [place] ° [address], [place] (336) 992-4800   °[facility]/Dr. Osei-Bonsu ° [address] [place] [address], [place] or [address], [place] (336) [phone] Phone number for both [place] and [place] locations is the same.  °[facility] [address], [place] (336) 299-0000   °[facility] [address] [place] [address], [place] or [address] (336) 852-7530 °(336) 878-2260   °[facility] [address], [place] (336) 350-1642, phone; (336) 294-5005, fax Sees patients 1st and 3rd Saturday of every month.  Must not qualify for public or private insurance (i.e. Medicaid, Medicare, [place] Health Choice, Veterans' Benefits) • Household income should be no more than 200% of the poverty level •The clinic cannot treat you if you are pregnant or think you are pregnant • Sexually transmitted diseases are not treated at the clinic.  ° ° °Dental Care: °Organization         Address  Phone  Notes  °[place] [facility] [address], [place] (336) 641-6152 Accepts children up to age 21 who are enrolled in Medicaid or [place] Health Choice; pregnant women with a Medicaid card; and children who have applied for Medicaid or [place] Health Choice, but were declined, whose parents can pay a reduced fee at time of service.  °[place]  [facility] [place]  [address], [place] (336) 641-7733 Accepts children up to age 21 who are enrolled in Medicaid or [place] Health Choice; pregnant women with a Medicaid card; and children who have applied for Medicaid or [place] Health Choice, but were declined, whose parents can pay a reduced fee at time of service.  °[facility] ° [address],  (336) 641-4533 Patients are seen by appointment only. Walk-ins are not accepted. [facility] will see patients 18 years of age and older. °Monday - Tuesday (8am-5pm) °Most Wednesdays (8:30-5pm) °$30 per visit, cash only  °[facility] ° [address], [place] (336) 641-4533 Patients are seen by appointment only. Walk-ins are not accepted. [facility] will see patients 18 years of age and older. °One   Wednesday Evening (Monthly: Volunteer Based).  $30 per visit, cash only  °[facility]  (919) 537-3737 for adults; Children under age 4, call Graduate Pediatric Dentistry at (919) [phone]. Children aged 4-14, please call (919) [phone] to request a pediatric application. ° Dental services are provided in all areas of dental care including fillings, crowns and bridges, complete and partial dentures, implants, gum treatment, root canals, and extractions. Preventive care is also provided. Treatment is provided to both adults and children. °Patients are selected via a lottery and there is often a waiting list. °  °[facility] [address], ° [place] ° (336) 763-8833 [email or web address] °  °[facility] [address], [place], [place] (336)723-1848, Ext. 123 Second and Fourth Thursday of each month, opens at 6:30 AM; Clinic ends at 9 AM.  Patients are seen on a first-come first-served basis, and a limited number are seen during each clinic.  ° °[facility] ° [address], [place], [place] (336) 723-7904    Eligibility Requirements °You must have lived in [place], [place], or [place] counties for at least the last three months. °  You cannot be eligible for state or federal sponsored healthcare insurance, including Veterans Administration, Medicaid, or Medicare. °  You generally cannot be eligible for healthcare insurance through your employer.  °  How to apply: °Eligibility screenings are held every Tuesday and Wednesday afternoon from 1:00 pm until 4:00 pm. You do not need an appointment for the interview!  °[facility] [address], [place], [place] 336-631-2330   °[facility]  336-342-8273   °[facility]  336-703-3100   °[facility]  336-570-6415   ° °Behavioral Health Resources in the Community: °Intensive Outpatient Programs °Organization         Address  Phone  Notes  °[facility] [address], [place], [place] 336-878-6098   °[facility] [address], [place], [place] 336-832-9800   °[facility] [address], [place], [place] ° 336-882-2125   °[facility] [address],  °[place], [place] 1-800-853-5163 or 336-641-4981   °Substance Abuse Resources °Organization         Address  Phone  Notes  °[facility]  336-882-2125   °[facility]  336-784-9470   °[facility]  336-285-9073   °[facility]  336-845-3988   °Residential & Outpatient Substance Abuse Program  1-800-659-3381   °Psychological Services °Organization         Address  Phone  Notes  °[facility]  336- 832-9600   °[facility]  336- 378-7881   °[facility] [address], [place] 1-800-853-5163 or 336-641-4981   ° °Mobile Crisis Teams °Organization         Address  Phone  Notes  °[facility], Mobile Crisis Care Unit  1-877-626-1772   °Assertive °[facility] ° [address]  [place], [place] 336-834-9664   °Sharon DeEsch [address] °[place] 336-554-5454   ° °Self-Help/Support Groups °Organization         Address  Phone             Notes  °[facility] [place] - variety of support groups  336- 373-1402 Call for more information  °Narcotics Anonymous (NA), [facility] [address], °[place]   2 meetings at this location  ° °  Residential Treatment Programs Organization         Address  Phone  Notes  [facility] [address],    [place]  1-609 262 8643   [facility]  [address] [place] [address], [place], [place]   [place], [place] 937-463-2021 Admissions: 8am-3pm M-F  [facility] [address],    [place], [place] 802-233-6122   [facility] [address] [place], [place], [place]   [facility] [address],  [place], [place]   [facility] - Intensive Outpatient [address], [place], [place]   [facility] [address]  [place], [place] 1-332-287-5692 or (773) 230-5447   [facility] ([facility] [address], [place], [place] Accepts Medicaid  [facility] [address]  [place] 1-747-158-2911 Substance Abuse/Addiction Treatment   [facility]         Address  Phone  Notes  [facility]  316-736-4889   Domenic Schwab, PhD [address]a [place], [place]   (936)087-0379 or 406-143-1715   [facility] [place], [place] 289-256-2556   [facility] [address] [place], [place] 913-133-2560 Insurance/Medicaid/sponsorship through [facility] [address]                                    [place], [place] 607-818-2355 [place] [address], [place] (905)[phone]    Dr. Adele Schilder  (480)[phone]   [facility] 1) [address], [place] 2) [place] 3)  [place] [address], [place] [phone] 336-628-6557  [phone]   [place] 3160510876 or [phone] (After Hours)       Take the prescriptions as directed.  Apply moist heat or ice to the area(s) of discomfort, for 15 minutes at a time, several times per day for the next few days.  Do not fall asleep on a heating or ice pack. Use your albuterol nebulizer (1 unit dose) every 4 hours as needed for cough, wheezing, or shortness of breath. Use your incentive spirometer: hold a small/firm pillow against your chest wall (to help splint the area) and perform 3 deep inhalations every 1 hour while you are awake for the next 2 to 3 weeks. Try to quit smoking.  Call your regular medical doctor tomorrow morning to schedule a follow up appointment within the next 2 days.  Return to the Emergency Department immediately sooner if worsening.

## 2014-02-15 ENCOUNTER — Encounter (HOSPITAL_COMMUNITY): Payer: Self-pay | Admitting: Emergency Medicine

## 2014-09-14 ENCOUNTER — Ambulatory Visit: Payer: Self-pay | Admitting: Primary Care

## 2014-10-05 ENCOUNTER — Telehealth: Payer: Self-pay | Admitting: General Practice

## 2014-10-05 ENCOUNTER — Ambulatory Visit: Payer: Self-pay | Admitting: Primary Care

## 2014-10-05 NOTE — Telephone Encounter (Signed)
Called to rs appt, - Call could not be completed as dialed

## 2014-10-05 NOTE — Telephone Encounter (Signed)
Yes, please reschedule at her convenience. Thanks!

## 2014-10-05 NOTE — Telephone Encounter (Signed)
Patient did not come in for their appointment today for new pt.  Please let me know if patient needs to be contacted immediately for follow up or no follow up needed.

## 2014-10-13 NOTE — Telephone Encounter (Signed)
rs for 7/14 pt aware

## 2014-10-28 ENCOUNTER — Ambulatory Visit: Payer: Self-pay | Admitting: Primary Care

## 2014-11-22 ENCOUNTER — Telehealth: Payer: Self-pay | Admitting: General Practice

## 2014-11-22 ENCOUNTER — Ambulatory Visit: Payer: Self-pay | Admitting: Primary Care

## 2014-11-22 NOTE — Telephone Encounter (Signed)
Patient did not come in for their appointment today for new pt.  Please let me know if patient needs to be contacted immediately for follow up or no follow up needed.

## 2014-11-22 NOTE — Telephone Encounter (Signed)
It appears as though this is her second no show for a new appointment. What's your policy regarding now shows?  I'm happy to see her, but not if she will no show again. Thanks.

## 2014-11-23 ENCOUNTER — Telehealth: Payer: Self-pay | Admitting: Primary Care

## 2014-11-23 NOTE — Telephone Encounter (Signed)
Sheri Ayala stated that there is no real official policy.  She states we can contact her and reschedule and then tell her if she no-shows again she will be dismissed from the practice or you can go ahead and dismiss her now.  She said it was up to you.

## 2014-11-23 NOTE — Telephone Encounter (Signed)
I'd like to give her a third chance before dismissal. Please reschedule at her convenience.  Thanks.

## 2014-11-25 NOTE — Telephone Encounter (Signed)
Called to rs appt, call could not be completed as dialed

## 2014-11-26 NOTE — Telephone Encounter (Signed)
Called to rs appt, call could not be completed

## 2014-11-30 NOTE — Telephone Encounter (Signed)
Called to rs appt, call could not be completed as dialed

## 2015-01-06 ENCOUNTER — Emergency Department (HOSPITAL_COMMUNITY)
Admission: EM | Admit: 2015-01-06 | Discharge: 2015-01-06 | Disposition: A | Discharge status: Home / Self Care | Payer: Medicare HMO | Attending: Emergency Medicine | Admitting: Emergency Medicine

## 2015-01-06 ENCOUNTER — Encounter (HOSPITAL_COMMUNITY): Payer: Self-pay | Admitting: Emergency Medicine

## 2015-01-06 DIAGNOSIS — Z90710 Acquired absence of both cervix and uterus: Secondary | ICD-10-CM | POA: Diagnosis not present

## 2015-01-06 DIAGNOSIS — R319 Hematuria, unspecified: Secondary | ICD-10-CM | POA: Insufficient documentation

## 2015-01-06 DIAGNOSIS — Z856 Personal history of leukemia: Secondary | ICD-10-CM | POA: Diagnosis not present

## 2015-01-06 DIAGNOSIS — Z79899 Other long term (current) drug therapy: Secondary | ICD-10-CM | POA: Insufficient documentation

## 2015-01-06 DIAGNOSIS — Z862 Personal history of diseases of the blood and blood-forming organs and certain disorders involving the immune mechanism: Secondary | ICD-10-CM | POA: Insufficient documentation

## 2015-01-06 DIAGNOSIS — Z8701 Personal history of pneumonia (recurrent): Secondary | ICD-10-CM | POA: Insufficient documentation

## 2015-01-06 DIAGNOSIS — Z8739 Personal history of other diseases of the musculoskeletal system and connective tissue: Secondary | ICD-10-CM | POA: Diagnosis not present

## 2015-01-06 DIAGNOSIS — J449 Chronic obstructive pulmonary disease, unspecified: Secondary | ICD-10-CM | POA: Insufficient documentation

## 2015-01-06 DIAGNOSIS — Z880 Allergy status to penicillin: Secondary | ICD-10-CM | POA: Insufficient documentation

## 2015-01-06 DIAGNOSIS — Z720 Tobacco use: Secondary | ICD-10-CM | POA: Insufficient documentation

## 2015-01-06 DIAGNOSIS — R1030 Lower abdominal pain, unspecified: Secondary | ICD-10-CM | POA: Diagnosis not present

## 2015-01-06 LAB — CBC
HCT: 42.6 % (ref 36.0–46.0)
HEMOGLOBIN: 14.1 g/dL (ref 12.0–15.0)
MCH: 31.4 pg (ref 26.0–34.0)
MCHC: 33.1 g/dL (ref 30.0–36.0)
MCV: 94.9 fL (ref 78.0–100.0)
PLATELETS: 95 10*3/uL — AB (ref 150–400)
RBC: 4.49 MIL/uL (ref 3.87–5.11)
RDW: 14.8 % (ref 11.5–15.5)
WBC: 40.7 10*3/uL — ABNORMAL HIGH (ref 4.0–10.5)

## 2015-01-06 LAB — URINALYSIS, ROUTINE W REFLEX MICROSCOPIC
BILIRUBIN URINE: NEGATIVE
GLUCOSE, UA: NEGATIVE mg/dL
Hgb urine dipstick: NEGATIVE
KETONES UR: NEGATIVE mg/dL
Leukocytes, UA: NEGATIVE
Nitrite: NEGATIVE
PH: 5 (ref 5.0–8.0)
PROTEIN: NEGATIVE mg/dL
Specific Gravity, Urine: 1.007 (ref 1.005–1.030)
Urobilinogen, UA: 0.2 mg/dL (ref 0.0–1.0)

## 2015-01-06 LAB — BASIC METABOLIC PANEL
Anion gap: 7 (ref 5–15)
BUN: 12 mg/dL (ref 6–20)
CALCIUM: 8.8 mg/dL — AB (ref 8.9–10.3)
CO2: 28 mmol/L (ref 22–32)
CREATININE: 0.5 mg/dL (ref 0.44–1.00)
Chloride: 105 mmol/L (ref 101–111)
GFR calc Af Amer: >60 mL/min (ref >60)
GLUCOSE: 74 mg/dL (ref 65–99)
Potassium: 4.1 mmol/L (ref 3.5–5.1)
Sodium: 140 mmol/L (ref 135–145)

## 2015-01-06 NOTE — ED Notes (Signed)
Per pt, states she woke up this am and noticed blood in her urine

## 2015-01-06 NOTE — ED Provider Notes (Signed)
CSN: [id number]     Arrival date & time 01/06/15  1649 History   First MD Initiated Contact with Patient 01/06/15 1830     Chief Complaint  Patient presents with  . Hematuria     (Consider location/radiation/quality/duration/timing/severity/associated sxs/prior Treatment) Patient is a 64 y.o. female presenting with hematuria. The history is provided by the patient.  Hematuria This is a new problem. The current episode started 12 to 24 hours ago. The problem occurs constantly. The problem has been gradually improving. Associated symptoms include abdominal pain (mild suprapubic burning). Pertinent negatives include no chest pain, no headaches and no shortness of breath. Nothing aggravates the symptoms. Nothing relieves the symptoms. She has tried nothing for the symptoms. The treatment provided no relief.   64 yo F with a chief complaint hematuria this was noticed this morning. Patient states that he was grossly bloody and then has progressively gotten better throughout the day. Patient was having some suprapubic burning starting yesterday. Denies fevers or chills denies flank pain. Has never had this issue before. Has a history of CLL.   Past Medical History  Diagnosis Date  . Headache(784.0)   . Pleurisy   . Bronchospasm   . Chronic lymphocytic leukemia   . Pneumonia   . Thrombocytopenia   . Olecranon bursitis of left elbow November 2012.  . Tobacco abuse   . Frontal sinusitis November 2012  . COPD (chronic obstructive pulmonary disease)   . Asthma    Past Surgical History  Procedure Laterality Date  . Abdominal hysterectomy     Family History  Problem Relation Age of Onset  . Aneurysm Mother    Social History  Substance Use Topics  . Smoking status: Current Every Day Smoker -- 1.00 packs/day for 45 years    Types: Cigarettes  . Smokeless tobacco: Never Used  . Alcohol Use: No   OB History    Gravida Para Term Preterm AB TAB SAB Ectopic Multiple Living   '2 2 2       2      '$ Review of Systems  Constitutional: Negative for fever and chills.  HENT: Negative for congestion and rhinorrhea.   Eyes: Negative for redness and visual disturbance.  Respiratory: Negative for shortness of breath and wheezing.   Cardiovascular: Negative for chest pain and palpitations.  Gastrointestinal: Positive for abdominal pain (mild suprapubic burning). Negative for nausea and vomiting.  Genitourinary: Positive for hematuria. Negative for dysuria and urgency.  Musculoskeletal: Negative for myalgias and arthralgias.  Skin: Negative for pallor and wound.  Neurological: Negative for dizziness and headaches.      Allergies  Citrus; Doxycycline; and Penicillins  Home Medications   Prior to Admission medications   Medication Sig Start Date End Date Taking? Authorizing Provider  albuterol (PROVENTIL) (2.5 MG/3ML) 0.083% nebulizer solution Take 3 mLs (2.5 mg total) by nebulization every 4 (four) hours as needed for wheezing or shortness of breath. 06/21/13  Yes Francine Graven, DO  ALPRAZolam Duanne Moron) 0.5 MG tablet Take 0.5 mg by mouth 2 (two) times daily as needed for anxiety or sleep.  12/27/14  Yes Historical Provider, MD  Aspirin-Acetaminophen-Caffeine (GOODY HEADACHE PO) Take 1 packet by mouth daily as needed (for pain).   Yes Historical Provider, MD  loratadine (CLARITIN) 10 MG tablet Take 10 mg by mouth daily. 01/05/15  Yes Historical Provider, MD  TABTUSSIN 400 MG TABS tablet Take 400 mg by mouth 3 (three) times daily. For 10 days 01/05/15 01/15/15 Yes Historical Provider, MD  oxyCODONE-acetaminophen (  PERCOCET/ROXICET) 5-325 MG per tablet 1 or 2 tabs PO q6h prn pain Patient not taking: Reported on 01/06/2015 06/21/13   Francine Graven, DO   BP 127/58 mmHg  Pulse 75  Temp(Src) 98.7 F (37.1 C) (Oral)  Resp 16  SpO2 94% Physical Exam  Constitutional: She is oriented to person, place, and time. She appears well-developed and well-nourished. No distress.  HENT:  Head:  Normocephalic and atraumatic.  Eyes: EOM are normal. Pupils are equal, round, and reactive to light.  Neck: Normal range of motion. Neck supple.  Cardiovascular: Normal rate and regular rhythm.  Exam reveals no gallop and no friction rub.   No murmur heard. Pulmonary/Chest: Effort normal. She has no wheezes. She has no rales.  Abdominal: Soft. She exhibits no distension. There is no tenderness. There is no rebound and no guarding.  Musculoskeletal: She exhibits no edema or tenderness.  Neurological: She is alert and oriented to person, place, and time.  Skin: Skin is warm and dry. She is not diaphoretic.  Psychiatric: She has a normal mood and affect. Her behavior is normal.    ED Course  Procedures (including critical care time) Labs Review Labs Reviewed  URINALYSIS, ROUTINE W REFLEX MICROSCOPIC (NOT AT [facility]) - Abnormal; Notable for the following:    Color, Urine RED (*)    APPearance CLOUDY (*)    All other components within normal limits  BASIC METABOLIC PANEL - Abnormal; Notable for the following:    Calcium 8.8 (*)    All other components within normal limits  CBC - Abnormal; Notable for the following:    WBC 40.7 (*)    Platelets 95 (*)    All other components within normal limits    Imaging Review No results found. I have personally reviewed and evaluated these images and lab results as part of my medical decision-making.   EKG Interpretation None      MDM   Final diagnoses:  Hematuria    64 yo F with a chief complaint hematuria. Benign abdominal exam. Will check a urine CMP.   UA red but without red blood cells. Possible myoglobinuria. Patient with normal renal function otherwise not having significant symptoms. Will have her rechecked at her PCP.    I have discussed the diagnosis/risks/treatment options with the patient and family and believe the pt to be eligible for discharge home to follow-up with PCP. We also discussed returning to the ED immediately if  new or worsening sx occur. We discussed the sx which are most concerning (e.g., sudden worsening pain, dysuria) that necessitate immediate return. Medications administered to the patient during their visit and any new prescriptions provided to the patient are listed below.  Medications given during this visit Medications - No data to display  Discharge Medication List as of 01/06/2015  7:28 PM       The patient appears reasonably screen and/or stabilized for discharge and I doubt any other medical condition or other [facility] requiring further screening, evaluation, or treatment in the ED at this time prior to discharge.     Deno Etienne, DO 01/06/15 2330

## 2015-01-06 NOTE — Discharge Instructions (Signed)

## 2015-01-29 ENCOUNTER — Emergency Department: Payer: Medicare HMO

## 2015-01-29 ENCOUNTER — Inpatient Hospital Stay
Admission: EM | Admit: 2015-01-29 | Discharge: 2015-01-31 | DRG: 177 | Disposition: A | Discharge status: Home / Self Care | Payer: Medicare HMO | Attending: Internal Medicine | Admitting: Internal Medicine

## 2015-01-29 ENCOUNTER — Encounter: Payer: Self-pay | Admitting: Emergency Medicine

## 2015-01-29 DIAGNOSIS — Z90710 Acquired absence of both cervix and uterus: Secondary | ICD-10-CM | POA: Diagnosis not present

## 2015-01-29 DIAGNOSIS — J441 Chronic obstructive pulmonary disease with (acute) exacerbation: Secondary | ICD-10-CM | POA: Diagnosis not present

## 2015-01-29 DIAGNOSIS — F419 Anxiety disorder, unspecified: Secondary | ICD-10-CM | POA: Diagnosis present

## 2015-01-29 DIAGNOSIS — C9510 Chronic leukemia of unspecified cell type not having achieved remission: Secondary | ICD-10-CM

## 2015-01-29 DIAGNOSIS — Z8489 Family history of other specified conditions: Secondary | ICD-10-CM | POA: Diagnosis not present

## 2015-01-29 DIAGNOSIS — F1290 Cannabis use, unspecified, uncomplicated: Secondary | ICD-10-CM | POA: Diagnosis present

## 2015-01-29 DIAGNOSIS — Z716 Tobacco abuse counseling: Secondary | ICD-10-CM | POA: Diagnosis not present

## 2015-01-29 DIAGNOSIS — D72829 Elevated white blood cell count, unspecified: Secondary | ICD-10-CM | POA: Diagnosis present

## 2015-01-29 DIAGNOSIS — J45909 Unspecified asthma, uncomplicated: Secondary | ICD-10-CM | POA: Diagnosis not present

## 2015-01-29 DIAGNOSIS — R938 Abnormal findings on diagnostic imaging of other specified body structures: Secondary | ICD-10-CM | POA: Diagnosis not present

## 2015-01-29 DIAGNOSIS — J181 Lobar pneumonia, unspecified organism: Secondary | ICD-10-CM

## 2015-01-29 DIAGNOSIS — F1390 Sedative, hypnotic, or anxiolytic use, unspecified, uncomplicated: Secondary | ICD-10-CM | POA: Diagnosis not present

## 2015-01-29 DIAGNOSIS — R531 Weakness: Secondary | ICD-10-CM

## 2015-01-29 DIAGNOSIS — R091 Pleurisy: Secondary | ICD-10-CM | POA: Diagnosis present

## 2015-01-29 DIAGNOSIS — Z7982 Long term (current) use of aspirin: Secondary | ICD-10-CM | POA: Diagnosis not present

## 2015-01-29 DIAGNOSIS — Z8701 Personal history of pneumonia (recurrent): Secondary | ICD-10-CM

## 2015-01-29 DIAGNOSIS — J189 Pneumonia, unspecified organism: Secondary | ICD-10-CM

## 2015-01-29 DIAGNOSIS — J9621 Acute and chronic respiratory failure with hypoxia: Secondary | ICD-10-CM | POA: Diagnosis present

## 2015-01-29 DIAGNOSIS — I10 Essential (primary) hypertension: Secondary | ICD-10-CM | POA: Diagnosis present

## 2015-01-29 DIAGNOSIS — Z79899 Other long term (current) drug therapy: Secondary | ICD-10-CM | POA: Diagnosis not present

## 2015-01-29 DIAGNOSIS — F17210 Nicotine dependence, cigarettes, uncomplicated: Secondary | ICD-10-CM | POA: Diagnosis present

## 2015-01-29 DIAGNOSIS — C9110 Chronic lymphocytic leukemia of B-cell type not having achieved remission: Secondary | ICD-10-CM | POA: Diagnosis not present

## 2015-01-29 DIAGNOSIS — J156 Pneumonia due to other aerobic Gram-negative bacteria: Principal | ICD-10-CM | POA: Diagnosis present

## 2015-01-29 DIAGNOSIS — Z880 Allergy status to penicillin: Secondary | ICD-10-CM | POA: Diagnosis not present

## 2015-01-29 DIAGNOSIS — R51 Headache: Secondary | ICD-10-CM

## 2015-01-29 DIAGNOSIS — Z888 Allergy status to other drugs, medicaments and biological substances status: Secondary | ICD-10-CM | POA: Diagnosis not present

## 2015-01-29 DIAGNOSIS — R5383 Other fatigue: Secondary | ICD-10-CM

## 2015-01-29 DIAGNOSIS — R519 Headache, unspecified: Secondary | ICD-10-CM

## 2015-01-29 LAB — CBC WITH DIFFERENTIAL/PLATELET
BASOS ABS: 0 10*3/uL (ref 0–0.1)
Basophils Relative: 0 %
EOS ABS: 0 10*3/uL (ref 0–0.7)
Eosinophils Relative: 0 %
HCT: 42.2 % (ref 35.0–47.0)
HEMOGLOBIN: 13.9 g/dL (ref 12.0–16.0)
Lymphocytes Relative: 66 %
Lymphs Abs: 17.7 10*3/uL — ABNORMAL HIGH (ref 1.0–3.6)
MCH: 30.7 pg (ref 26.0–34.0)
MCHC: 33 g/dL (ref 32.0–36.0)
MCV: 93.3 fL (ref 80.0–100.0)
Monocytes Absolute: 0.8 10*3/uL (ref 0.2–0.9)
Monocytes Relative: 3 %
NEUTROS ABS: 8.3 10*3/uL — AB (ref 1.4–6.5)
NEUTROS PCT: 31 %
Platelets: 93 10*3/uL — ABNORMAL LOW (ref 150–440)
RBC: 4.53 MIL/uL (ref 3.80–5.20)
RDW: 15.4 % — ABNORMAL HIGH (ref 11.5–14.5)
WBC: 26.8 10*3/uL — AB (ref 3.6–11.0)

## 2015-01-29 LAB — URINE DRUG SCREEN, QUALITATIVE (ARMC ONLY)
Amphetamines, Ur Screen: NOT DETECTED
BARBITURATES, UR SCREEN: NOT DETECTED
Benzodiazepine, Ur Scrn: POSITIVE — AB
CANNABINOID 50 NG, UR ~~LOC~~: POSITIVE — AB
COCAINE METABOLITE, UR ~~LOC~~: NOT DETECTED
MDMA (Ecstasy)Ur Screen: NOT DETECTED
METHADONE SCREEN, URINE: NOT DETECTED
Opiate, Ur Screen: NOT DETECTED
Phencyclidine (PCP) Ur S: NOT DETECTED
TRICYCLIC, UR SCREEN: NOT DETECTED

## 2015-01-29 LAB — URINALYSIS COMPLETE WITH MICROSCOPIC (ARMC ONLY)
BILIRUBIN URINE: NEGATIVE
GLUCOSE, UA: NEGATIVE mg/dL
Ketones, ur: NEGATIVE mg/dL
Leukocytes, UA: NEGATIVE
Nitrite: NEGATIVE
Protein, ur: NEGATIVE mg/dL
Specific Gravity, Urine: 1.011 (ref 1.005–1.030)
pH: 5 (ref 5.0–8.0)

## 2015-01-29 LAB — COMPREHENSIVE METABOLIC PANEL
ALT: 19 U/L (ref 14–54)
AST: 39 U/L (ref 15–41)
Albumin: 3.9 g/dL (ref 3.5–5.0)
Alkaline Phosphatase: 62 U/L (ref 38–126)
Anion gap: 6 (ref 5–15)
BUN: 14 mg/dL (ref 6–20)
CHLORIDE: 107 mmol/L (ref 101–111)
CO2: 27 mmol/L (ref 22–32)
CREATININE: 0.71 mg/dL (ref 0.44–1.00)
Calcium: 8.3 mg/dL — ABNORMAL LOW (ref 8.9–10.3)
GFR calc Af Amer: >60 mL/min (ref >60)
GFR calc non Af Amer: >60 mL/min (ref >60)
Glucose, Bld: 94 mg/dL (ref 65–99)
Potassium: 3.9 mmol/L (ref 3.5–5.1)
SODIUM: 140 mmol/L (ref 135–145)
TOTAL PROTEIN: 6.8 g/dL (ref 6.5–8.1)
Total Bilirubin: 0.5 mg/dL (ref 0.3–1.2)

## 2015-01-29 LAB — ETHANOL

## 2015-01-29 MED ORDER — LEVOFLOXACIN IN D5W 750 MG/150ML IV SOLN
750.0000 mg | INTRAVENOUS | Status: DC
  Administered 2015-01-30: 750 mg via INTRAVENOUS
  Filled 2015-01-29 (×2): qty 150

## 2015-01-29 MED ORDER — ONDANSETRON HCL 4 MG/2ML IJ SOLN: 4.0000 mg | Freq: Four times a day (QID) | INTRAMUSCULAR | Status: DC | PRN

## 2015-01-29 MED ORDER — ACETAMINOPHEN 650 MG RE SUPP: 650.0000 mg | Freq: Four times a day (QID) | RECTAL | Status: DC | PRN

## 2015-01-29 MED ORDER — IPRATROPIUM-ALBUTEROL 0.5-2.5 (3) MG/3ML IN SOLN
3.0000 mL | Freq: Once | RESPIRATORY_TRACT | Status: AC
  Administered 2015-01-29: 3 mL via RESPIRATORY_TRACT
  Filled 2015-01-29: qty 3

## 2015-01-29 MED ORDER — ALPRAZOLAM 0.5 MG PO TABS
0.5000 mg | ORAL_TABLET | Freq: Two times a day (BID) | ORAL | Status: DC | PRN
  Administered 2015-01-30 – 2015-01-31 (×3): 0.5 mg via ORAL
  Filled 2015-01-29 (×3): qty 1

## 2015-01-29 MED ORDER — LEVOFLOXACIN IN D5W 750 MG/150ML IV SOLN
750.0000 mg | Freq: Once | INTRAVENOUS | Status: AC
  Administered 2015-01-29: 750 mg via INTRAVENOUS
  Filled 2015-01-29: qty 150

## 2015-01-29 MED ORDER — IPRATROPIUM-ALBUTEROL 0.5-2.5 (3) MG/3ML IN SOLN
3.0000 mL | RESPIRATORY_TRACT | Status: DC
  Administered 2015-01-30 (×5): 3 mL via RESPIRATORY_TRACT
  Filled 2015-01-29 (×5): qty 3

## 2015-01-29 MED ORDER — NICOTINE 21 MG/24HR TD PT24
21.0000 mg | MEDICATED_PATCH | Freq: Every day | TRANSDERMAL | Status: DC
  Administered 2015-01-30 – 2015-01-31 (×3): 21 mg via TRANSDERMAL
  Filled 2015-01-29 (×3): qty 1

## 2015-01-29 MED ORDER — ENOXAPARIN SODIUM 40 MG/0.4ML ~~LOC~~ SOLN
40.0000 mg | SUBCUTANEOUS | Status: DC
  Administered 2015-01-30 (×2): 40 mg via SUBCUTANEOUS
  Filled 2015-01-29 (×2): qty 0.4

## 2015-01-29 MED ORDER — ACETAMINOPHEN 325 MG PO TABS
650.0000 mg | ORAL_TABLET | Freq: Four times a day (QID) | ORAL | Status: DC | PRN
  Administered 2015-01-30 – 2015-01-31 (×3): 650 mg via ORAL
  Filled 2015-01-29 (×4): qty 2

## 2015-01-29 MED ORDER — INFLUENZA VAC SPLIT QUAD 0.5 ML IM SUSY
0.5000 mL | PREFILLED_SYRINGE | INTRAMUSCULAR | Status: AC
  Administered 2015-01-30: 0.5 mL via INTRAMUSCULAR
  Filled 2015-01-29: qty 0.5

## 2015-01-29 MED ORDER — ONDANSETRON HCL 4 MG PO TABS: 4.0000 mg | ORAL_TABLET | Freq: Four times a day (QID) | ORAL | Status: DC | PRN

## 2015-01-29 MED ORDER — METHYLPREDNISOLONE SODIUM SUCC 40 MG IJ SOLR
40.0000 mg | Freq: Four times a day (QID) | INTRAMUSCULAR | Status: DC
  Administered 2015-01-30 – 2015-01-31 (×6): 40 mg via INTRAVENOUS
  Filled 2015-01-29 (×6): qty 1

## 2015-01-29 NOTE — Progress Notes (Signed)
ANTIBIOTIC CONSULT NOTE - INITIAL  Pharmacy Consult for Levaquin Indication: pneumonia  Allergies  Allergen Reactions  . Citrus     Burning in stomach   . Doxycycline     Caused pt daily headaches to be more severe   . Penicillins Hives, Nausea And Vomiting and Swelling    Has patient had a PCN reaction causing immediate rash, facial/tongue/throat swelling, SOB or lightheadedness with hypotension: Yes Has patient had a PCN reaction causing severe rash involving mucus membranes or skin necrosis: unknown Has patient had a PCN reaction that required hospitalization No Has patient had a PCN reaction occurring within the last 10 years: No If all of the above answers are "NO", then may proceed with Cephalosporin use.     Patient Measurements: Height: '5\' 10"'$  (177.8 cm) Weight: 125 lb (56.7 kg) IBW/kg (Calculated) : 68.5  Vital Signs: Temp: 98.9 F (37.2 C) (10/15 2054) Temp Source: Oral (10/15 2054) BP: 116/59 mmHg (10/15 2054) Pulse Rate: 111 (10/15 2054) Intake/Output from previous day:   Intake/Output from this shift:    Labs:  Recent Labs  01/29/15 1803  WBC 26.8*  HGB 13.9  PLT 93*  CREATININE 0.71   Estimated Creatinine Clearance: 62.8 mL/min (by C-G formula based on Cr of 0.71). No results for input(s): VANCOTROUGH, VANCOPEAK, VANCORANDOM, GENTTROUGH, GENTPEAK, GENTRANDOM, TOBRATROUGH, TOBRAPEAK, TOBRARND, AMIKACINPEAK, AMIKACINTROU, AMIKACIN in the last 72 hours.   Microbiology: No results found for this or any previous visit (from the past 720 hour(s)).  Medical History: Past Medical History  Diagnosis Date  . Headache(784.0)   . Pleurisy   . Bronchospasm   . Chronic lymphocytic leukemia ([place])   . Pneumonia   . Thrombocytopenia ([place])   . Olecranon bursitis of left elbow November 2012.  . Tobacco abuse   . Frontal sinusitis November 2012  . COPD (chronic obstructive pulmonary disease) ([place])   . Asthma     Medications:  Scheduled:  . enoxaparin  (LOVENOX) injection  40 mg Subcutaneous Q24H  . ipratropium-albuterol  3 mL Nebulization Q4H  . [START ON 01/30/2015] levofloxacin (LEVAQUIN) IV  750 mg Intravenous Q24H  . methylPREDNISolone (SOLU-MEDROL) injection  40 mg Intravenous Q6H  . nicotine  21 mg Transdermal Daily   Assessment: 64 yo female admitted for COPD exacerbation and community acquired pneumonia.  Pharmacy consulted to dose levaquin.  CrCl = 62.8 ml/min  Goal of Therapy:  Resolution of infection  Plan:  Patient received one dose of levaquin in the ED Based on renal function and indication will start levaquin '750mg'$  IV Q24H. Continue to monitor renal function. Follow up culture results  Pharmacy will continue to follow with you. Thank you for the consult.  Norma Fredrickson, PharmD Clinical Pharmacist 01/29/2015,9:08 PM

## 2015-01-29 NOTE — ED Provider Notes (Signed)
[facility] Emergency Department Provider Note  ____________________________________________  Time seen: On arrival at 1700  I have reviewed the triage vital signs and the nursing notes.   HISTORY  Chief Complaint Headache  general weakness    HPI Sheri Ayala is a 64 y.o. female whose daughter called 911 because the patient appears weak and is complaining of a headache. The patient reports this headache is on the right side. She has a similar headache almost every day. She will this morning with the headache present. She was able to get up and go about her business, leaving the house and running errands. She returned home and took a nap. When she awoke, apparently her daughter was more concerned about her and called for EMS. The patient does appear to be a bit weak, and is slow to transition from the EMS stretcher to the emergency department bed. She is communicative and denies any focal weakness.  The patient has seen a neurologist recently. She reports that she is scheduled for an EEG this coming week. The patient reports that she has not had any imaging of her head, including CT or MRI.  Patient also reports she has a history of leukemia. She has not seen her cancer doctor in 3 years. She tells me she has asked her primary physician to provide a referral to her oncologist, but this has not been arranged.   Past Medical History  Diagnosis Date  . Headache(784.0)   . Pleurisy   . Bronchospasm   . Chronic lymphocytic leukemia ([place])   . Pneumonia   . Thrombocytopenia ([place])   . Olecranon bursitis of left elbow November 2012.  . Tobacco abuse   . Frontal sinusitis November 2012  . COPD (chronic obstructive pulmonary disease) ([place])   . Asthma     Patient Active Problem List   Diagnosis Date Noted  . SOB (shortness of breath) 12/24/2012  . Dizziness 12/22/2012  . COPD with acute exacerbation ([place]) 12/22/2012  . Anemia 03/28/2011  . Cellulitis of both  external ears 03/27/2011  . Headache in front of head 03/27/2011  . Thrombocytopenia ([place]) 03/27/2011  . Acute frontal sinusitis 03/10/2011  . Cellulitis of elbow 03/09/2011  . Bursitis of elbow 03/09/2011  . CLL (chronic lymphocytic leukemia) ([place]) 03/09/2011  . Tobacco abuse 03/09/2011    Past Surgical History  Procedure Laterality Date  . Abdominal hysterectomy      Current Outpatient Rx  Name  Route  Sig  Dispense  Refill  . albuterol (PROVENTIL) (2.5 MG/3ML) 0.083% nebulizer solution   Nebulization   Take 3 mLs (2.5 mg total) by nebulization every 4 (four) hours as needed for wheezing or shortness of breath.   75 mL   0   . ALPRAZolam (XANAX) 0.5 MG tablet   Oral   Take 0.5 mg by mouth 2 (two) times daily as needed for anxiety or sleep.          . Aspirin-Acetaminophen-Caffeine (GOODY HEADACHE PO)   Oral   Take 1 packet by mouth daily as needed (for pain).         Marland Kitchen loratadine (CLARITIN) 10 MG tablet   Oral   Take 10 mg by mouth daily.         Marland Kitchen oxyCODONE-acetaminophen (PERCOCET/ROXICET) 5-325 MG per tablet      1 or 2 tabs PO q6h prn pain Patient not taking: Reported on 01/06/2015   20 tablet   0     Allergies Citrus;  Doxycycline; and Penicillins  Family History  Problem Relation Age of Onset  . Aneurysm Mother     Social History Social History  Substance Use Topics  . Smoking status: Current Every Day Smoker -- 1.00 packs/day for 45 years    Types: Cigarettes  . Smokeless tobacco: Never Used  . Alcohol Use: No    Review of Systems  Constitutional: Negative for fever. Positive for general weakness and headache. ENT: Negative for sore throat. Cardiovascular: Negative for chest pain. Respiratory: Negative for cough. Gastrointestinal: Negative for abdominal pain, vomiting and diarrhea. Genitourinary: Negative for dysuria. Musculoskeletal: No myalgias or injuries. Skin: Negative for rash. Neurological: Negative for paresthesia. Positive  for general weakness and headache. See history of present illness   10-point ROS otherwise negative.  ____________________________________________   PHYSICAL EXAM:  VITAL SIGNS: ED Triage Vitals  Enc Vitals Group     BP 01/29/15 1719 123/98 mmHg     Pulse Rate 01/29/15 1719 116     Resp 01/29/15 1730 23     Temp 01/29/15 1719 99 F (37.2 C)     Temp Source 01/29/15 1719 Oral     SpO2 01/29/15 1719 86 %     Weight 01/29/15 1719 125 lb (56.7 kg)     Height 01/29/15 1719 '5\' 10"'$  (1.778 m)     Head Cir --      Peak Flow --      Pain Score 01/29/15 1719 7     Pain Loc --      Pain Edu? --      Excl. in [place]? --     Constitutional: Alert and oriented, but appears a little bit lethargic, moves slow. No acute distress. ENT   Head: Normocephalic and atraumatic.   Nose: No congestion/rhinnorhea.    Cardiovascular: Normal rate, regular rhythm, no murmur noted Respiratory:  Normal respiratory effort, no tachypnea.    Breath sounds are clear and equal bilaterally.  Gastrointestinal: Soft and nontender. No distention.  Back: No muscle spasm, no tenderness, no CVA tenderness. Musculoskeletal: No deformity noted. Nontender with normal range of motion in all extremities.  No noted edema. Neurologic:  Somewhat slow but otherwise normal speech and language. Ambulatory, but does not appear to stand up completely upright and appears generally weak. Grip strength is equal bilaterally. 5 over 5 strength in all 4 extremities. Skin:  Skin is warm, dry. No rash noted. Dusky appearance to the skin. Psychiatric: Mood and affect are normal. Speech and behavior are normal.  ____________________________________________    LABS (pertinent positives/negatives)  Labs Reviewed  CBC WITH DIFFERENTIAL/PLATELET - Abnormal; Notable for the following:    WBC 26.8 (*)    RDW 15.4 (*)    Platelets 93 (*)    Neutro Abs 8.3 (*)    Lymphs Abs 17.7 (*)    All other components within normal limits   COMPREHENSIVE METABOLIC PANEL - Abnormal; Notable for the following:    Calcium 8.3 (*)    All other components within normal limits  URINALYSIS COMPLETEWITH MICROSCOPIC ([facility] ONLY) - Abnormal; Notable for the following:    Color, Urine YELLOW (*)    APPearance CLEAR (*)    Hgb urine dipstick 1+ (*)    Bacteria, UA RARE (*)    Squamous Epithelial / LPF 0-5 (*)    All other components within normal limits  URINE DRUG SCREEN, QUALITATIVE ([facility] ONLY) - Abnormal; Notable for the following:    Cannabinoid 50 Ng, Ur [place] POSITIVE (*)  Benzodiazepine, Ur Scrn POSITIVE (*)    All other components within normal limits  CULTURE, BLOOD (ROUTINE X 2)  CULTURE, BLOOD (ROUTINE X 2)  ETHANOL     ____________________________________________   EKG  ED ECG REPORT I, Tameko Halder W, the attending physician, personally viewed and interpreted this ECG.   Date: 01/29/2015  EKG Time: 1737  Rate: 102  Rhythm: Sinus tachycardia  Axis: Normal  Intervals: Normal  ST&T Change: None noted   ____________________________________________    RADIOLOGY  CT head: IMPRESSION: No intracranial hemorrhage, mass effect or midline shift.  There is subtle area of decreased attenuation in left parietal region peripheral axial image 18. Similar small area decreased attenuation in axial image 19. Evolving ischemia/infarct in left MCA territory cannot be excluded. Clinical correlation is necessary. Follow-up MRI with diffusion imaging is recommended as clinically warranted. There is no intraventricular hemorrhage.  These results were called by telephone at the time of interpretation on 01/29/2015 at 6:12 pm to Dr. Francene Castle , who verbally acknowledged these results.   Chest x-ray:  IMPRESSION: 1. Findings in the right lower lobe concerning for acute bronchopneumonia or sequela of recent aspiration. Followup PA and lateral chest X-ray is recommended in 3-4 weeks following trial  of antibiotic therapy to ensure resolution and exclude underlying malignancy. 2. Chronic emphysematous changes in bilateral apical scarring, similar to prior studies. 3. Atherosclerosis. ____________________________________________  INITIAL IMPRESSION / ASSESSMENT AND PLAN / ED COURSE  Pertinent labs & imaging results that were available during my care of the patient were reviewed by me and considered in my medical decision making (see chart for details).  64 year old female with a primary complaint of right-sided headache, similar to her usual, and a concern for general weakness on the part of her family. It is hard to gauge how much of her general weakness and complaints is chronic versus an underlying undiagnosed problem. Given affect that she has not had any imaging of her head and has recurrent headaches, I will perform a CT scan of her head. Given her chronic leukemia, we will assess a blood count and differential.  ----------------------------------------- 6:41 PM on 01/29/2015 -----------------------------------------  I received a phone call from radiology regarding the patient's head CT. There is some concerns for decreased attenuation on the left worrisome for early ischemic changes. Recommendation for an MRI has been made. There is no acute hemorrhage noted.  The patient's chest x-ray does show similar changes but also shows a area in the right lower lobe concerning for pneumonia.  The patient does have a history of leukemia. She has noted leukocytosis, but is unclear to me whether this is acute or chronic. We will obtain blood cultures and treat her for pneumonia and seek admission to the hospital.     Repeat exam finds her a bit more alert than when she first arrived. Repeat neuro exam finds no focal neurologic deficit.   ____________________________________________   FINAL CLINICAL IMPRESSION(S) / ED DIAGNOSES  Final diagnoses:  Chronic leukemia, not having achieved  remission (HCC)  Leukocytosis  General weakness  Right-sided headache  Community acquired pneumonia  COPD with acute exacerbation ([place])      Ahmed Prima, MD 01/29/15 [phone]

## 2015-01-29 NOTE — ED Notes (Signed)
Pt arrived via EMS from home.  States she woke up with HA around 0800.  She went about her day and around 3pm HA got worse and she laid down to take a nap.  Woke about 445pm and HA still there.  Has a history of headaches but today's is worse.

## 2015-01-29 NOTE — H&P (Signed)
[place] [facility] [place] NAME: Sheri Ayala    MR#:  [id number]  DATE OF BIRTH:  May 20, 1950  DATE OF ADMISSION:  01/29/2015  PRIMARY CARE PHYSICIAN: DAVIS,JEROME, MD   REQUESTING/REFERRING PHYSICIAN: Dr. Francene Castle  CHIEF COMPLAINT:   Chief Complaint  Patient presents with  . Headache   altered mental status, cough/congestion and generalized weakness.  HISTORY OF PRESENT ILLNESS:  Sheri Ayala  is a 64 y.o. female with a known history of chronic lymphocytic leukemia, previous history of pneumonia, pleurisy, COPD with ongoing tobacco abuse who presents to the hospital due to altered mental status and also noted to have cough congestion and worsening generalized weakness. As per the family patient has been a bit more confused the past day or 2. She also complains of generalized weakness and complains of a cough and congestion with productive sputum. Today they were at the grocery store shopping when she kept wanting to sit down as she was feeling increasingly weak. She was therefore brought to the ER for further evaluation. She denies any chest pain, nausea, vomiting, abdominal pain, fever, chills any recent sick contacts. Patient was noted to be hypoxic and noted to have a chest x-ray findings suggestive of pneumonia and likely to have COPD exacerbation and therefore hospitalist services were contacted for further treatment and evaluation.  PAST MEDICAL HISTORY:   Past Medical History  Diagnosis Date  . Headache(784.0)   . Pleurisy   . Bronchospasm   . Chronic lymphocytic leukemia ([place])   . Pneumonia   . Thrombocytopenia ([place])   . Olecranon bursitis of left elbow November 2012.  . Tobacco abuse   . Frontal sinusitis November 2012  . COPD (chronic obstructive pulmonary disease) ([place])   . Asthma     PAST SURGICAL HISTORY:   Past Surgical History  Procedure Laterality Date  . Abdominal hysterectomy      SOCIAL HISTORY:   Social  History  Substance Use Topics  . Smoking status: Current Every Day Smoker -- 1.00 packs/day for 45 years    Types: Cigarettes  . Smokeless tobacco: Never Used  . Alcohol Use: No    FAMILY HISTORY:   Family History  Problem Relation Age of Onset  . Aneurysm Mother   . Liver disease Father     DRUG ALLERGIES:   Allergies  Allergen Reactions  . Citrus     Burning in stomach   . Doxycycline     Caused pt daily headaches to be more severe   . Penicillins Hives, Nausea And Vomiting and Swelling    Has patient had a PCN reaction causing immediate rash, facial/tongue/throat swelling, SOB or lightheadedness with hypotension: Yes Has patient had a PCN reaction causing severe rash involving mucus membranes or skin necrosis: unknown Has patient had a PCN reaction that required hospitalization No Has patient had a PCN reaction occurring within the last 10 years: No If all of the above answers are "NO", then may proceed with Cephalosporin use.     REVIEW OF SYSTEMS:   Review of Systems  Constitutional: Negative for fever and weight loss.  HENT: Negative for congestion, nosebleeds and tinnitus.   Eyes: Negative for blurred vision, double vision and redness.  Respiratory: Positive for cough, sputum production, shortness of breath and wheezing. Negative for hemoptysis.   Cardiovascular: Negative for chest pain, orthopnea, leg swelling and PND.  Gastrointestinal: Negative for nausea, vomiting, abdominal pain, diarrhea and melena.  Genitourinary: Negative for dysuria,  urgency and hematuria.  Musculoskeletal: Negative for joint pain and falls.  Neurological: Positive for weakness (generalized). Negative for dizziness, tingling, sensory change, focal weakness, seizures and headaches.  Endo/Heme/Allergies: Negative for polydipsia. Does not bruise/bleed easily.  Psychiatric/Behavioral: Negative for depression and memory loss. The patient is not nervous/anxious.     MEDICATIONS AT HOME:    Prior to Admission medications   Medication Sig Start Date End Date Taking? Authorizing Provider  albuterol (PROVENTIL) (2.5 MG/3ML) 0.083% nebulizer solution Take 3 mLs (2.5 mg total) by nebulization every 4 (four) hours as needed for wheezing or shortness of breath. 06/21/13  Yes Francine Graven, DO  ALPRAZolam Duanne Moron) 0.5 MG tablet Take 0.5 mg by mouth 2 (two) times daily as needed for anxiety or sleep.  12/27/14  Yes Historical Provider, MD  Aspirin-Acetaminophen-Caffeine (GOODY HEADACHE PO) Take 1 packet by mouth 2 (two) times daily as needed (for pain).    Yes Historical Provider, MD  loratadine (CLARITIN) 10 MG tablet Take 10 mg by mouth daily. 01/05/15   Historical Provider, MD  oxyCODONE-acetaminophen (PERCOCET/ROXICET) 5-325 MG per tablet 1 or 2 tabs PO q6h prn pain Patient not taking: Reported on 01/06/2015 06/21/13   Francine Graven, DO      VITAL SIGNS:  Blood pressure 122/52, pulse 103, temperature 99 F (37.2 C), temperature source Oral, resp. rate 18, height '5\' 10"'$  (1.778 m), weight 56.7 kg (125 lb), SpO2 97 %.  PHYSICAL EXAMINATION:  Physical Exam  GENERAL:  64 y.o.-year-old patient lying in the bed with no acute distress.  EYES: Pupils equal, round, reactive to light and accommodation. No scleral icterus. Extraocular muscles intact.  HEENT: Head atraumatic, normocephalic. Oropharynx and nasopharynx clear. No oropharyngeal erythema, moist oral mucosa  NECK:  Supple, no jugular venous distention. No thyroid enlargement, no tenderness.  LUNGS: Prolonged inspiratory and expiratory phase. And expiratory wheezing bilaterally. Negative use of accessory muscles, no dullness to percussion  CARDIOVASCULAR: S1, S2 RRR. No murmurs, rubs, gallops, clicks.  ABDOMEN: Soft, nontender, nondistended. Bowel sounds present. No organomegaly or mass.  EXTREMITIES: No pedal edema, cyanosis, or clubbing. + 2 pedal & radial pulses b/l.   NEUROLOGIC: Cranial nerves II through XII are intact. No  focal Motor or sensory deficits appreciated b/l PSYCHIATRIC: The patient is alert and oriented x 3. Good affect.  SKIN: No obvious rash, lesion, or ulcer.   LABORATORY PANEL:   CBC  Recent Labs Lab 01/29/15 1803  WBC 26.8*  HGB 13.9  HCT 42.2  PLT 93*   ------------------------------------------------------------------------------------------------------------------  Chemistries   Recent Labs Lab 01/29/15 1803  NA 140  K 3.9  CL 107  CO2 27  GLUCOSE 94  BUN 14  CREATININE 0.71  CALCIUM 8.3*  AST 39  ALT 19  ALKPHOS 62  BILITOT 0.5   ------------------------------------------------------------------------------------------------------------------  Cardiac Enzymes No results for input(s): TROPONINI in the last 168 hours. ------------------------------------------------------------------------------------------------------------------  RADIOLOGY:  Dg Chest 2 View  01/29/2015  CLINICAL DATA:  64 year old female with history of headaches starting this morning and generalize weakness with mild shortness of breath. History of asthma and COPD. EXAM: CHEST  2 VIEW COMPARISON:  Chest x-ray 06/21/2013. FINDINGS: Emphysematous changes are again noted in the lungs bilaterally. Patchy airspace consolidation in the right lower lobe, where there also appears to be pronounced peribronchial cuffing. No pleural effusions. No evidence of pulmonary edema. Extensive bilateral apical pleuroparenchymal thickening, similar to prior examinations, most compatible with chronic post infectious or inflammatory scarring. Atherosclerotic calcifications in the thoracic aorta. Heart size is  normal. The patient is rotated to the left on today's exam, resulting in distortion of the mediastinal contours and reduced diagnostic sensitivity and specificity for mediastinal pathology. IMPRESSION: 1. Findings in the right lower lobe concerning for acute bronchopneumonia or sequela of recent aspiration. Followup PA  and lateral chest X-ray is recommended in 3-4 weeks following trial of antibiotic therapy to ensure resolution and exclude underlying malignancy. 2. Chronic emphysematous changes in bilateral apical scarring, similar to prior studies. 3. Atherosclerosis. Electronically Signed   By: Vinnie Langton M.D.   On: 01/29/2015 18:11   Ct Head Wo Contrast  01/29/2015  CLINICAL DATA:  Weakness, headache, lethargy EXAM: CT HEAD WITHOUT CONTRAST TECHNIQUE: Contiguous axial images were obtained from the base of the skull through the vertex without intravenous contrast. COMPARISON:  12/22/2012 FINDINGS: No skull fracture is noted. No intracranial hemorrhage, mass effect or midline shift. There is subtle area of decreased attenuation in left parietal region peripheral axial image 18. Similar decreased attenuation in axial image 19. Evolving ischemia/infarct in left MCA territory cannot be excluded. Clinical correlation is necessary. Follow-up MRI with diffusion imaging is recommended as clinically warranted. There is no intraventricular hemorrhage. IMPRESSION: No intracranial hemorrhage, mass effect or midline shift. There is subtle area of decreased attenuation in left parietal region peripheral axial image 18. Similar small area decreased attenuation in axial image 19. Evolving ischemia/infarct in left MCA territory cannot be excluded. Clinical correlation is necessary. Follow-up MRI with diffusion imaging is recommended as clinically warranted. There is no intraventricular hemorrhage. These results were called by telephone at the time of interpretation on 01/29/2015 at 6:12 pm to Dr. Francene Castle , who verbally acknowledged these results. Electronically Signed   By: Lahoma Crocker M.D.   On: 01/29/2015 18:12     IMPRESSION AND PLAN:   64 year old female with past medical history of CLL, COPD, ongoing tobacco abuse, anxiety, who presents to the hospital due to altered mental status, cough congestion and generalized  weakness and noted to be in COPD exacerbation.  #1 COPD exacerbation-this is likely secondary to ongoing tobacco abuse and also possible underlying pneumonia seen on the chest x-ray. -I will start her on IV Solu-Medrol, duo nebs around-the-clock, patient likely needs to be assessed for home oxygen prior to discharge. -I will also treat her pneumonia with IV Levaquin. -Patient likely also needs to be started on either Symbicort/Spiriva upon discharge.  #2 pneumonia-community-acquired. -We'll treat with IV Levaquin and follow sputum and blood cultures.  #3 leukocytosis-this is chronically elevated due to her CLL. -We'll follow serial counts.  #4 anxiety-continue Xanax.   #5 Tobacco abuse - will place on nicotine patch.    All the records are reviewed and case discussed with ED provider. Management plans discussed with the patient, family and they are in agreement.  CODE STATUS: Full  TOTAL TIME TAKING CARE OF THIS PATIENT: 45 minutes.    Henreitta Leber M.D on 01/29/2015 at 7:45 PM  Between 7am to 6pm - Pager - [phone]  After 6pm go to [email or web address] - password [email or web address] [facility]  Office  [phone]  CC: Primary care physician; Suzi Roots, MD

## 2015-01-30 DIAGNOSIS — J189 Pneumonia, unspecified organism: Secondary | ICD-10-CM

## 2015-01-30 DIAGNOSIS — J156 Pneumonia due to other aerobic Gram-negative bacteria: Secondary | ICD-10-CM | POA: Diagnosis not present

## 2015-01-30 DIAGNOSIS — J181 Lobar pneumonia, unspecified organism: Secondary | ICD-10-CM

## 2015-01-30 DIAGNOSIS — J9621 Acute and chronic respiratory failure with hypoxia: Secondary | ICD-10-CM

## 2015-01-30 LAB — BASIC METABOLIC PANEL
Anion gap: 7 (ref 5–15)
BUN: 16 mg/dL (ref 6–20)
CHLORIDE: 108 mmol/L (ref 101–111)
CO2: 27 mmol/L (ref 22–32)
CREATININE: 0.62 mg/dL (ref 0.44–1.00)
Calcium: 8.1 mg/dL — ABNORMAL LOW (ref 8.9–10.3)
GFR calc non Af Amer: >60 mL/min (ref >60)
Glucose, Bld: 128 mg/dL — ABNORMAL HIGH (ref 65–99)
Potassium: 3.9 mmol/L (ref 3.5–5.1)
Sodium: 142 mmol/L (ref 135–145)

## 2015-01-30 LAB — CBC
HCT: 38.1 % (ref 35.0–47.0)
HEMOGLOBIN: 12.6 g/dL (ref 12.0–16.0)
MCH: 31.2 pg (ref 26.0–34.0)
MCHC: 33.1 g/dL (ref 32.0–36.0)
MCV: 94.3 fL (ref 80.0–100.0)
PLATELETS: 81 10*3/uL — AB (ref 150–440)
RBC: 4.05 MIL/uL (ref 3.80–5.20)
RDW: 15.6 % — ABNORMAL HIGH (ref 11.5–14.5)
WBC: 21.7 10*3/uL — ABNORMAL HIGH (ref 3.6–11.0)

## 2015-01-30 LAB — EXPECTORATED SPUTUM ASSESSMENT W REFEX TO RESP CULTURE

## 2015-01-30 LAB — EXPECTORATED SPUTUM ASSESSMENT W GRAM STAIN, RFLX TO RESP C: Special Requests: NORMAL

## 2015-01-30 MED ORDER — NICOTINE 21 MG/24HR TD PT24: 21.0000 mg | MEDICATED_PATCH | Freq: Every day | TRANSDERMAL | Status: DC

## 2015-01-30 MED ORDER — BUDESONIDE-FORMOTEROL FUMARATE 160-4.5 MCG/ACT IN AERO
2.0000 | INHALATION_SPRAY | Freq: Two times a day (BID) | RESPIRATORY_TRACT | Status: DC
  Administered 2015-01-30 – 2015-01-31 (×3): 2 via RESPIRATORY_TRACT
  Filled 2015-01-30: qty 6

## 2015-01-30 MED ORDER — ATORVASTATIN CALCIUM 20 MG PO TABS
40.0000 mg | ORAL_TABLET | Freq: Every day | ORAL | Status: DC
  Administered 2015-01-30: 40 mg via ORAL
  Filled 2015-01-30: qty 2

## 2015-01-30 MED ORDER — ASPIRIN EC 325 MG PO TBEC
325.0000 mg | DELAYED_RELEASE_TABLET | Freq: Every day | ORAL | Status: DC
  Administered 2015-01-30 – 2015-01-31 (×2): 325 mg via ORAL
  Filled 2015-01-30 (×2): qty 1

## 2015-01-30 MED ORDER — NICOTINE 10 MG IN INHA: 1.0000 | RESPIRATORY_TRACT | Status: DC | PRN

## 2015-01-30 NOTE — Progress Notes (Addendum)
[place] [facility] [place] NAME: Sheri Ayala    MR#:  [id number]  DATE OF BIRTH:  09/10/1950  SUBJECTIVE:  CHIEF COMPLAINT:   Chief Complaint  Patient presents with  . Headache   patient is 64 year old Caucasian female with a history of tobacco abuse, COPD, emphysema who presents to the hospital with cough, shortness of breath, wheezing and sputum production. She was also noted to have altered mental status, confusion and generalized weakness. She was noted to be hypoxic and her chest x-ray revealed pneumonia. She was admitted for COPD exacerbation therapy. Patient feels much better today. Denies any significant pains, although admits of having some wheezing, mostly on the  left side of the chest anteriorly. She feels improved  Review of Systems  Constitutional: Negative for fever, chills and weight loss.  HENT: Negative for congestion.   Eyes: Negative for blurred vision and double vision.  Respiratory: Positive for cough, sputum production, shortness of breath and wheezing.   Cardiovascular: Negative for chest pain, palpitations, orthopnea, leg swelling and PND.  Gastrointestinal: Negative for nausea, vomiting, abdominal pain, diarrhea, constipation and blood in stool.  Genitourinary: Negative for dysuria, urgency, frequency and hematuria.  Musculoskeletal: Negative for falls.  Neurological: Negative for dizziness, tremors, focal weakness and headaches.  Endo/Heme/Allergies: Does not bruise/bleed easily.  Psychiatric/Behavioral: Negative for depression. The patient does not have insomnia.     VITAL SIGNS: Blood pressure 113/50, pulse 86, temperature 98 F (36.7 C), temperature source Oral, resp. rate 16, height '5\' 10"'$  (1.778 m), weight 56.7 kg (125 lb), SpO2 95 %.  PHYSICAL EXAMINATION:   GENERAL:  64 y.o.-year-old patient lying in the bed with no acute distress.  EYES: Pupils equal, round, reactive to light and accommodation. No scleral  icterus. Extraocular muscles intact.  HEENT: Head atraumatic, normocephalic. Oropharynx and nasopharynx clear.  NECK:  Supple, no jugular venous distention. No thyroid enlargement, no tenderness.  LUNGS: Markedly diminished breath sounds bilaterally posteriorly, few anterior left-sided wheezes, no rales,rhonchi or crepitation. No use of accessory muscles of respiration.  CARDIOVASCULAR: S1, S2 normal. No murmurs, rubs, or gallops.  ABDOMEN: Soft, nontender, nondistended. Bowel sounds present. No organomegaly or mass.  EXTREMITIES: No pedal edema, cyanosis, or clubbing.  NEUROLOGIC: Cranial nerves II through XII are intact. Muscle strength 5/5 in all extremities. Sensation intact. Gait not checked.  PSYCHIATRIC: The patient is alert and oriented x 3. Globally weak SKIN: No obvious rash, lesion, or ulcer.   ORDERS/RESULTS REVIEWED:   CBC  Recent Labs Lab 01/29/15 1803 01/30/15 0357  WBC 26.8* 21.7*  HGB 13.9 12.6  HCT 42.2 38.1  PLT 93* 81*  MCV 93.3 94.3  MCH 30.7 31.2  MCHC 33.0 33.1  RDW 15.4* 15.6*  LYMPHSABS 17.7*  --   MONOABS 0.8  --   EOSABS 0.0  --   BASOSABS 0.0  --    ------------------------------------------------------------------------------------------------------------------  Chemistries   Recent Labs Lab 01/29/15 1803 01/30/15 0357  NA 140 142  K 3.9 3.9  CL 107 108  CO2 27 27  GLUCOSE 94 128*  BUN 14 16  CREATININE 0.71 0.62  CALCIUM 8.3* 8.1*  AST 39  --   ALT 19  --   ALKPHOS 62  --   BILITOT 0.5  --    ------------------------------------------------------------------------------------------------------------------ estimated creatinine clearance is 62.8 mL/min (by C-G formula based on Cr of 0.62). ------------------------------------------------------------------------------------------------------------------ No results for input(s): TSH, T4TOTAL, T3FREE, THYROIDAB in the last 72 hours.  Invalid input(s): FREET3  Cardiac Enzymes No  results for input(s): CKMB, TROPONINI, MYOGLOBIN in the last 168 hours.  Invalid input(s): CK ------------------------------------------------------------------------------------------------------------------ Invalid input(s): POCBNP ---------------------------------------------------------------------------------------------------------------  RADIOLOGY: Dg Chest 2 View  01/29/2015  CLINICAL DATA:  64 year old female with history of headaches starting this morning and generalize weakness with mild shortness of breath. History of asthma and COPD. EXAM: CHEST  2 VIEW COMPARISON:  Chest x-ray 06/21/2013. FINDINGS: Emphysematous changes are again noted in the lungs bilaterally. Patchy airspace consolidation in the right lower lobe, where there also appears to be pronounced peribronchial cuffing. No pleural effusions. No evidence of pulmonary edema. Extensive bilateral apical pleuroparenchymal thickening, similar to prior examinations, most compatible with chronic post infectious or inflammatory scarring. Atherosclerotic calcifications in the thoracic aorta. Heart size is normal. The patient is rotated to the left on today's exam, resulting in distortion of the mediastinal contours and reduced diagnostic sensitivity and specificity for mediastinal pathology. IMPRESSION: 1. Findings in the right lower lobe concerning for acute bronchopneumonia or sequela of recent aspiration. Followup PA and lateral chest X-ray is recommended in 3-4 weeks following trial of antibiotic therapy to ensure resolution and exclude underlying malignancy. 2. Chronic emphysematous changes in bilateral apical scarring, similar to prior studies. 3. Atherosclerosis. Electronically Signed   By: Vinnie Langton M.D.   On: 01/29/2015 18:11   Ct Head Wo Contrast  01/29/2015  CLINICAL DATA:  Weakness, headache, lethargy EXAM: CT HEAD WITHOUT CONTRAST TECHNIQUE: Contiguous axial images were obtained from the base of the skull through the  vertex without intravenous contrast. COMPARISON:  12/22/2012 FINDINGS: No skull fracture is noted. No intracranial hemorrhage, mass effect or midline shift. There is subtle area of decreased attenuation in left parietal region peripheral axial image 18. Similar decreased attenuation in axial image 19. Evolving ischemia/infarct in left MCA territory cannot be excluded. Clinical correlation is necessary. Follow-up MRI with diffusion imaging is recommended as clinically warranted. There is no intraventricular hemorrhage. IMPRESSION: No intracranial hemorrhage, mass effect or midline shift. There is subtle area of decreased attenuation in left parietal region peripheral axial image 18. Similar small area decreased attenuation in axial image 19. Evolving ischemia/infarct in left MCA territory cannot be excluded. Clinical correlation is necessary. Follow-up MRI with diffusion imaging is recommended as clinically warranted. There is no intraventricular hemorrhage. These results were called by telephone at the time of interpretation on 01/29/2015 at 6:12 pm to Dr. Francene Castle , who verbally acknowledged these results. Electronically Signed   By: Lahoma Crocker M.D.   On: 01/29/2015 18:12    EKG:  Orders placed or performed during the hospital encounter of 01/29/15  . ED EKG  . ED EKG    ASSESSMENT AND PLAN:  Active Problems:   COPD exacerbation ([place]) 1. Acute likely on chronic respiratory failure with hypoxia, patient is being weaned to room air, follow oxygenation on exertion 2. Right lower lobe pneumonia of unclear etiology. Sputum cultures are taken, pending, blood cultures are pending as well Continue antibiotic therapy with levofloxacin 3. COPD exacerbation. Continue steroids, nebulizing therapy with DuoNeb nebs add Symbicort, improved clinically 4. Generalized weakness. Get physical therapy evaluation. We will obtain MRI of brain, as patient's CT scan showed some abnormalities concerning for possible  stroke. Initiate patient on aspirin and Lipitor 5. Tobacco abuse. Discussed with patient for approximately 3-4 minutes. Nicotine replacement therapy is going to be initiated upon discharge for which patient is agreeable.    Management plans discussed with the patient, family and they are in agreement.  DRUG ALLERGIES:  Allergies  Allergen Reactions  . Citrus     Burning in stomach   . Doxycycline     Caused pt daily headaches to be more severe   . Penicillins Hives, Nausea And Vomiting and Swelling    Has patient had a PCN reaction causing immediate rash, facial/tongue/throat swelling, SOB or lightheadedness with hypotension: Yes Has patient had a PCN reaction causing severe rash involving mucus membranes or skin necrosis: unknown Has patient had a PCN reaction that required hospitalization No Has patient had a PCN reaction occurring within the last 10 years: No If all of the above answers are "NO", then may proceed with Cephalosporin use.     CODE STATUS:     Code Status Orders        Start     Ordered   01/29/15 2100  Full code   Continuous     01/29/15 2059      TOTAL TIME TAKING CARE OF THIS PATIENT: 40 minutes.    Theodoro Grist M.D on 01/30/2015 at 11:20 AM  Between 7am to 6pm - Pager - [phone]  After 6pm go to [email or web address] - password [email or web address] [facility]  Office  [phone]  CC: Primary care physician; Suzi Roots, MD

## 2015-01-30 NOTE — Care Management Note (Signed)
Case Management Note  Patient Details  Name: Sheri Ayala MRN: [id number] Date of Birth: 1950-08-28  Subjective/Objective:    64yo Ms Sheri Ayala was admitted 01/29/15 with AMS and generalized weakness. Dx: PNA and COPD exacerbation. Hx of chronic lymphocytic leukemia and COPD. PCP=Dr Suzi Roots. Pharmacy=[facility] in [place]. Resides with a roommate. No home assistive equipment and no home oxygen. Friends drive her to appointments. Has no home health services and does not expect to be discharged home with any. Case management will follow for discharge planning.                Action/Plan:   Expected Discharge Date:                  Expected Discharge Plan:     In-House Referral:     Discharge planning Services     Post Acute Care Choice:    Choice offered to:     DME Arranged:    DME Agency:     HH Arranged:    [place] Agency:     Status of Service:     Medicare Important Message Given:    Date Medicare IM Given:    Medicare IM give by:    Date Additional Medicare IM Given:    Additional Medicare Important Message give by:     If discussed at [place] of Stay Meetings, dates discussed:    Additional Comments:  Sheri Ayala A, RN 01/30/2015, 3:55 PM

## 2015-01-31 ENCOUNTER — Inpatient Hospital Stay: Payer: Medicare HMO

## 2015-01-31 ENCOUNTER — Telehealth: Payer: Self-pay | Admitting: Hematology

## 2015-01-31 DIAGNOSIS — R938 Abnormal findings on diagnostic imaging of other specified body structures: Secondary | ICD-10-CM

## 2015-01-31 MED ORDER — NICOTINE 21 MG/24HR TD PT24: 21.0000 mg | MEDICATED_PATCH | Freq: Every day | TRANSDERMAL | Status: DC

## 2015-01-31 MED ORDER — LEVOFLOXACIN 750 MG PO TABS: 750.0000 mg | ORAL_TABLET | Freq: Every day | ORAL | Status: DC

## 2015-01-31 MED ORDER — IPRATROPIUM-ALBUTEROL 0.5-2.5 (3) MG/3ML IN SOLN: 3.0000 mL | RESPIRATORY_TRACT | Status: DC | PRN

## 2015-01-31 MED ORDER — BUDESONIDE-FORMOTEROL FUMARATE 160-4.5 MCG/ACT IN AERO: 2.0000 | INHALATION_SPRAY | Freq: Two times a day (BID) | RESPIRATORY_TRACT | Status: DC

## 2015-01-31 MED ORDER — IPRATROPIUM-ALBUTEROL 0.5-2.5 (3) MG/3ML IN SOLN
3.0000 mL | Freq: Four times a day (QID) | RESPIRATORY_TRACT | Status: DC
  Administered 2015-01-31 (×2): 3 mL via RESPIRATORY_TRACT
  Filled 2015-01-31 (×2): qty 3

## 2015-01-31 MED ORDER — NICOTINE 10 MG IN INHA: 1.0000 | RESPIRATORY_TRACT | Status: DC | PRN

## 2015-01-31 MED ORDER — METHYLPREDNISOLONE 4 MG PO TBPK: ORAL_TABLET | ORAL | Status: DC

## 2015-01-31 MED ORDER — IPRATROPIUM-ALBUTEROL 0.5-2.5 (3) MG/3ML IN SOLN: 3.0000 mL | Freq: Four times a day (QID) | RESPIRATORY_TRACT | Status: DC

## 2015-01-31 NOTE — Care Management Important Message (Signed)
Important Message  Patient Details  Name: Sheri Ayala MRN: [id number] Date of Birth: [birth year]   Medicare Important Message Given:  Yes-second notification given    Marshell Garfinkel, RN 01/31/2015, 9:44 AM

## 2015-01-31 NOTE — Discharge Summary (Signed)
[name] NAME: Sheri Ayala    MR#:  [id number]  DATE OF BIRTH:  01/18/1951  DATE OF ADMISSION:  01/29/2015 ADMITTING PHYSICIAN: Henreitta Leber, MD  DATE OF DISCHARGE: No discharge date for patient encounter.  PRIMARY CARE PHYSICIAN: DAVIS,JEROME, MD     ADMISSION DIAGNOSIS:  Leukocytosis [D72.829] Community acquired pneumonia [J18.9] General weakness [R53.1] Right-sided headache [R51] Chronic leukemia, not having achieved remission ([place]) [C95.10] COPD with acute exacerbation ([place]) [J44.1]  DISCHARGE DIAGNOSIS:  Active Problems:   COPD exacerbation (HCC)   Acute on chronic respiratory failure with hypoxia (HCC)   Right lower lobe pneumonia   Generalized weakness   SECONDARY DIAGNOSIS:   Past Medical History  Diagnosis Date  . Headache(784.0)   . Pleurisy   . Bronchospasm   . Chronic lymphocytic leukemia ([place])   . Pneumonia   . Thrombocytopenia ([place])   . Olecranon bursitis of left elbow November 2012.  . Tobacco abuse   . Frontal sinusitis November 2012  . COPD (chronic obstructive pulmonary disease) ([place])   . Asthma     .pro HOSPITAL COURSE:   The patient is 64 year old Caucasian female with history of COPD, emphysema ongoing tobacco abuse who presents to the hospital with complaints of other mental status, cough, congestion as well as generalized weakness. In emergency room, she was noted to be hypoxic. Initial chest x-ray revealed right lower lobe pneumonia and chronic emphysematous changes with some biapical scarring. Labs showed elevation of white blood cell count of 26.8 thousand,  Lymphocyte predominant. UA was unremarkable. Patient's urine drug screen was positive for benzodiazepines and cannabinoids. Blood cultures taken in emergency room showed no growth. Sputum cultures revealed rare Gram-negative rods. Patient was initiated on levofloxacin, steroids, inhalation therapy, and her condition  improved. She was weaned off oxygen and her oxygenation remained stable, her white blood cell count also improved. Because of altered mental status. In emergency room, she underwent CT scanning of her head which showed subtle area of decreased attenuation in the left parietal region, questionable evolving MCA ischemia, MRI of the brain progressive cerebral white matter changes, neurology consultation was obtained and the neurologist felt that radiologic changes likely where due to small vessel ischemic changes versus chemotherapy related changes. Patient was ablated in emergency room by physical therapist in no physical therapy recommendations were made for home. Patient was advised to quit smoking and smoking cessation counseling was performed while she was in the hospital. Discussion by problem 1. Acute likely on chronic respiratory failure with hypoxia, patient's oxygen was weaned to room air saturation is good, even on exertion on room air. Patient is ready to be discharged home today with no physical therapy recommendations for home  2. Right lower lobe pneumonia due to gram-negative rods .  Sputum culture ID is still pending, blood cultures are negative . A patient clinically improved on levofloxacin,  Continue antibiotic therapy with levofloxacin for 4 more days to complete course 3. COPD exacerbation. Taper steroids, continue nebulizing therapy with DuoNeb nebs and Symbicort, improved clinically 4. Generalized weakness. Physical therapy was completed, no recommendations for home. MRI of brain was obtained and revealed chronic changes likely related to chemotherapy versus chronic ischemic changes according to neurologist, Dr. Irish Elders 5. Tobacco abuse. Discussed with patient for approximately 3-4 minutes. Nicotine replacement therapy is prescribed for her upon discharge DISCHARGE CONDITIONS:   Fair  CONSULTS OBTAINED:  Treatment Team:  Leotis Pain, MD  DRUG ALLERGIES:  Allergies   Allergen Reactions  . Citrus     Burning in stomach   . Doxycycline     Caused pt daily headaches to be more severe   . Penicillins Hives, Nausea And Vomiting and Swelling    Has patient had a PCN reaction causing immediate rash, facial/tongue/throat swelling, SOB or lightheadedness with hypotension: Yes Has patient had a PCN reaction causing severe rash involving mucus membranes or skin necrosis: unknown Has patient had a PCN reaction that required hospitalization No Has patient had a PCN reaction occurring within the last 10 years: No If all of the above answers are "NO", then may proceed with Cephalosporin use.     DISCHARGE MEDICATIONS:   Current Discharge Medication List    START taking these medications   Details  budesonide-formoterol (SYMBICORT) 160-4.5 MCG/ACT inhaler Inhale 2 puffs into the lungs 2 (two) times daily. Qty: 1 Inhaler, Refills: 12    ipratropium-albuterol (DUONEB) 0.5-2.5 (3) MG/3ML SOLN Take 3 mLs by nebulization 4 (four) times daily. Qty: 360 mL, Refills: 6    levofloxacin (LEVAQUIN) 750 MG tablet Take 1 tablet (750 mg total) by mouth daily. Qty: 4 tablet, Refills: 0    methylPREDNISolone (MEDROL DOSEPAK) 4 MG TBPK tablet follow package directions Qty: 21 tablet, Refills: 0    nicotine (NICODERM CQ - DOSED IN MG/24 HOURS) 21 mg/24hr patch Place 1 patch (21 mg total) onto the skin daily. Qty: 28 patch, Refills: 0    nicotine (NICOTROL) 10 MG inhaler Inhale 1 cartridge (1 continuous puffing total) into the lungs as needed for smoking cessation. Qty: 42 each, Refills: 0      CONTINUE these medications which have NOT CHANGED   Details  albuterol (PROVENTIL) (2.5 MG/3ML) 0.083% nebulizer solution Take 3 mLs (2.5 mg total) by nebulization every 4 (four) hours as needed for wheezing or shortness of breath. Qty: 75 mL, Refills: 0    ALPRAZolam (XANAX) 0.5 MG tablet Take 0.5 mg by mouth 2 (two) times daily as needed for anxiety or sleep.      Aspirin-Acetaminophen-Caffeine (GOODY HEADACHE PO) Take 1 packet by mouth 2 (two) times daily as needed (for pain).     loratadine (CLARITIN) 10 MG tablet Take 10 mg by mouth daily.    oxyCODONE-acetaminophen (PERCOCET/ROXICET) 5-325 MG per tablet 1 or 2 tabs PO q6h prn pain Qty: 20 tablet, Refills: 0         DISCHARGE INSTRUCTIONS:    Patient is to follow-up with primary care physician and pulmonologist as outpatient  If you experience worsening of your admission symptoms, develop shortness of breath, life threatening emergency, suicidal or homicidal thoughts you must seek medical attention immediately by calling 911 or calling your MD immediately  if symptoms less severe.  You Must read complete instructions/literature along with all the possible adverse reactions/side effects for all the Medicines you take and that have been prescribed to you. Take any new Medicines after you have completely understood and accept all the possible adverse reactions/side effects.   Please note  You were cared for by a hospitalist during your hospital stay. If you have any questions about your discharge medications or the care you received while you were in the hospital after you are discharged, you can call the unit and asked to speak with the hospitalist on call if the hospitalist that took care of you is not available. Once you are discharged, your primary care physician will handle any further medical issues. Please note that NO REFILLS for  any discharge medications will be authorized once you are discharged, as it is imperative that you return to your primary care physician (or establish a relationship with a primary care physician if you do not have one) for your aftercare needs so that they can reassess your need for medications and monitor your lab values.    Today   CHIEF COMPLAINT:   Chief Complaint  Patient presents with  . Headache    HISTORY OF PRESENT ILLNESS:  Sheri Ayala  is a 64  y.o. female with a known history of COPD, emphysema ongoing tobacco abuse who presents to the hospital with complaints of other mental status, cough, congestion as well as generalized weakness. In emergency room, she was noted to be hypoxic. Initial chest x-ray revealed right lower lobe pneumonia and chronic emphysematous changes with some biapical scarring. Labs showed elevation of white blood cell count of 26.8 thousand,  Lymphocyte predominant. UA was unremarkable. Patient's urine drug screen was positive for benzodiazepines and cannabinoids. Blood cultures taken in emergency room showed no growth. Sputum cultures revealed rare Gram-negative rods. Patient was initiated on levofloxacin, steroids, inhalation therapy, and her condition improved. She was weaned off oxygen and her oxygenation remained stable, her white blood cell count also improved. Because of altered mental status. In emergency room, she underwent CT scanning of her head which showed subtle area of decreased attenuation in the left parietal region, questionable evolving MCA ischemia, MRI of the brain progressive cerebral white matter changes, neurology consultation was obtained and the neurologist felt that radiologic changes likely where due to small vessel ischemic changes versus chemotherapy related changes. Patient was ablated in emergency room by physical therapist in no physical therapy recommendations were made for home. Patient was advised to quit smoking and smoking cessation counseling was performed while she was in the hospital. Discussion by problem 1. Acute likely on chronic respiratory failure with hypoxia, patient's oxygen was weaned to room air saturation is good, even on exertion on room air. Patient is ready to be discharged home today with no physical therapy recommendations for home  2. Right lower lobe pneumonia due to gram-negative rods .  Sputum culture ID is still pending, blood cultures are negative . A patient clinically  improved on levofloxacin,  Continue antibiotic therapy with levofloxacin for 4 more days to complete course 3. COPD exacerbation. Taper steroids, continue nebulizing therapy with DuoNeb nebs and Symbicort, improved clinically 4. Generalized weakness. Physical therapy was completed, no recommendations for home. MRI of brain was obtained and revealed chronic changes likely related to chemotherapy versus chronic ischemic changes according to neurologist, Dr. Irish Elders 5. Tobacco abuse. Discussed with patient for approximately 3-4 minutes. Nicotine replacement therapy is prescribed for her upon discharge    VITAL SIGNS:  Blood pressure 110/49, pulse 85, temperature 98.3 F (36.8 C), temperature source Oral, resp. rate 18, height '5\' 10"'$  (1.778 m), weight 56.7 kg (125 lb), SpO2 94 %.  I/O:   Intake/Output Summary (Last 24 hours) at 01/31/15 1242 Last data filed at 01/31/15 0800  Gross per 24 hour  Intake    270 ml  Output      0 ml  Net    270 ml    PHYSICAL EXAMINATION:  GENERAL:  64 y.o.-year-old patient lying in the bed with no acute distress.  EYES: Pupils equal, round, reactive to light and accommodation. No scleral icterus. Extraocular muscles intact.  HEENT: Head atraumatic, normocephalic. Oropharynx and nasopharynx clear.  NECK:  Supple, no jugular venous distention.  No thyroid enlargement, no tenderness.  LUNGS: Better air entrance bilaterally anteriorly as well as posteriorly , no wheezing, few rales,rhonchi were noted, but no  crepitation. No use of accessory muscles of respiration.  CARDIOVASCULAR: S1, S2 normal. No murmurs, rubs, or gallops.  ABDOMEN: Soft, non-tender, non-distended. Bowel sounds present. No organomegaly or mass.  EXTREMITIES: No pedal edema, cyanosis, or clubbing.  NEUROLOGIC: Cranial nerves II through XII are intact. Muscle strength 5/5 in all extremities. Sensation intact. Gait not checked.  PSYCHIATRIC: The patient is alert and oriented x 3.  SKIN: No  obvious rash, lesion, or ulcer.   DATA REVIEW:   CBC  Recent Labs Lab 01/30/15 0357  WBC 21.7*  HGB 12.6  HCT 38.1  PLT 81*    Chemistries   Recent Labs Lab 01/29/15 1803 01/30/15 0357  NA 140 142  K 3.9 3.9  CL 107 108  CO2 27 27  GLUCOSE 94 128*  BUN 14 16  CREATININE 0.71 0.62  CALCIUM 8.3* 8.1*  AST 39  --   ALT 19  --   ALKPHOS 62  --   BILITOT 0.5  --     Cardiac Enzymes No results for input(s): TROPONINI in the last 168 hours.  Microbiology Results  Results for orders placed or performed during the hospital encounter of 01/29/15  Culture, blood (routine x 2)     Status: None (Preliminary result)   Collection Time: 01/29/15  7:02 PM  Result Value Ref Range Status   Specimen Description BLOOD LEFT ARM  Final   Special Requests BOTTLES DRAWN AEROBIC AND ANAEROBIC [place]  Final   Culture NO GROWTH 2 DAYS  Final   Report Status PENDING  Incomplete  Culture, blood (routine x 2)     Status: None (Preliminary result)   Collection Time: 01/29/15  7:06 PM  Result Value Ref Range Status   Specimen Description BLOOD RIGHT ARM  Final   Special Requests BOTTLES DRAWN AEROBIC AND ANAEROBIC 6CC  Final   Culture NO GROWTH 2 DAYS  Final   Report Status PENDING  Incomplete  Culture, sputum-assessment     Status: None   Collection Time: 01/30/15  2:49 PM  Result Value Ref Range Status   Specimen Description EXPECTORATED SPUTUM  Final   Special Requests Normal  Final   Sputum evaluation THIS SPECIMEN IS ACCEPTABLE FOR SPUTUM CULTURE  Final   Report Status 01/30/2015 FINAL  Final  Culture, respiratory (NON-Expectorated)     Status: None (Preliminary result)   Collection Time: 01/30/15  2:49 PM  Result Value Ref Range Status   Specimen Description EXPECTORATED SPUTUM  Final   Special Requests Normal Reflexed from [id number]  Final   Gram Stain PENDING  Incomplete   Culture   Final    RARE GRAM NEGATIVE RODS IDENTIFICATION AND SUSCEPTIBILITIES TO FOLLOW    Report Status  PENDING  Incomplete    RADIOLOGY:  Dg Chest 2 View  01/29/2015  CLINICAL DATA:  64 year old female with history of headaches starting this morning and generalize weakness with mild shortness of breath. History of asthma and COPD. EXAM: CHEST  2 VIEW COMPARISON:  Chest x-ray 06/21/2013. FINDINGS: Emphysematous changes are again noted in the lungs bilaterally. Patchy airspace consolidation in the right lower lobe, where there also appears to be pronounced peribronchial cuffing. No pleural effusions. No evidence of pulmonary edema. Extensive bilateral apical pleuroparenchymal thickening, similar to prior examinations, most compatible with chronic post infectious or inflammatory scarring. Atherosclerotic calcifications in the thoracic  aorta. Heart size is normal. The patient is rotated to the left on today's exam, resulting in distortion of the mediastinal contours and reduced diagnostic sensitivity and specificity for mediastinal pathology. IMPRESSION: 1. Findings in the right lower lobe concerning for acute bronchopneumonia or sequela of recent aspiration. Followup PA and lateral chest X-ray is recommended in 3-4 weeks following trial of antibiotic therapy to ensure resolution and exclude underlying malignancy. 2. Chronic emphysematous changes in bilateral apical scarring, similar to prior studies. 3. Atherosclerosis. Electronically Signed   By: Vinnie Langton M.D.   On: 01/29/2015 18:11   Ct Head Wo Contrast  01/29/2015  CLINICAL DATA:  Weakness, headache, lethargy EXAM: CT HEAD WITHOUT CONTRAST TECHNIQUE: Contiguous axial images were obtained from the base of the skull through the vertex without intravenous contrast. COMPARISON:  12/22/2012 FINDINGS: No skull fracture is noted. No intracranial hemorrhage, mass effect or midline shift. There is subtle area of decreased attenuation in left parietal region peripheral axial image 18. Similar decreased attenuation in axial image 19. Evolving ischemia/infarct  in left MCA territory cannot be excluded. Clinical correlation is necessary. Follow-up MRI with diffusion imaging is recommended as clinically warranted. There is no intraventricular hemorrhage. IMPRESSION: No intracranial hemorrhage, mass effect or midline shift. There is subtle area of decreased attenuation in left parietal region peripheral axial image 18. Similar small area decreased attenuation in axial image 19. Evolving ischemia/infarct in left MCA territory cannot be excluded. Clinical correlation is necessary. Follow-up MRI with diffusion imaging is recommended as clinically warranted. There is no intraventricular hemorrhage. These results were called by telephone at the time of interpretation on 01/29/2015 at 6:12 pm to Dr. Francene Castle , who verbally acknowledged these results. Electronically Signed   By: Lahoma Crocker M.D.   On: 01/29/2015 18:12   Mr Brain Wo Contrast  01/31/2015  CLINICAL DATA:  Right-sided headache for the last 6 months. EXAM: MRI HEAD WITHOUT CONTRAST TECHNIQUE: Multiplanar, multiecho pulse sequences of the brain and surrounding structures were obtained without intravenous contrast. COMPARISON:  Head CT 01/29/2015.  MRI 12/22/2012. FINDINGS: Diffusion imaging does not show any acute or subacute infarction. The brainstem and cerebellum are normal. Within the cerebral hemispheres, there are scattered foci of abnormal FLAIR and T2 signal within the deep and subcortical white matter. These are progressive since 2014. No evidence of cortical or large vessel territory infarction. No mass lesion, hemorrhage, hydrocephalus or extra-axial collection. No pituitary mass. No inflammatory sinus disease. No skull or skullbase lesion. IMPRESSION: Abnormal foci of T2 and FLAIR signal within the cerebral white matter, progressive since 2014. The appearance could be due to demyelinating disease/ multiple sclerosis. The differential diagnosis does include progressive small-vessel ischemic changes and  other causes of multifocal white matter insults including vasculitis. None of the abnormalities show restricted diffusion or acute features. Electronically Signed   By: Nelson Chimes M.D.   On: 01/31/2015 09:34    EKG:   Orders placed or performed during the hospital encounter of 01/29/15  . ED EKG  . ED EKG      Management plans discussed with the patient, family and they are in agreement.  CODE STATUS:     Code Status Orders        Start     Ordered   01/29/15 2100  Full code   Continuous     01/29/15 2059      TOTAL TIME TAKING CARE OF THIS PATIENT: 40 minutes.    Theodoro Grist M.D on 01/31/2015 at 12:42 PM  Between 7am to 6pm - Pager - [phone]  After 6pm go to [email or web address] - password [email or web address]  Office  [phone]  CC: Primary care physician; Suzi Roots, MD

## 2015-01-31 NOTE — Consult Note (Signed)
CC: abnormal MRI, generalized weakness   HPI: Sheri Ayala is an 64 y.o. female Caucasian female with a history of tobacco abuse, COPD, emphysema who presents to the hospital with cough, shortness of breath, wheezing and sputum production. She was also noted to have altered mental status, confusion and generalized weakness. She was noted to be hypoxic and her chest x-ray revealed pneumonia.  Now back to baseline Neurology consulted for abnormal MRI consistent with hyperdensities on Flair and T2.   Past Medical History  Diagnosis Date  . Headache(784.0)   . Pleurisy   . Bronchospasm   . Chronic lymphocytic leukemia ([place])   . Pneumonia   . Thrombocytopenia ([place])   . Olecranon bursitis of left elbow November 2012.  . Tobacco abuse   . Frontal sinusitis November 2012  . COPD (chronic obstructive pulmonary disease) ([place])   . Asthma     Past Surgical History  Procedure Laterality Date  . Abdominal hysterectomy      Family History  Problem Relation Age of Onset  . Aneurysm Mother   . Liver disease Father     Social History:  reports that she has been smoking Cigarettes.  She has a 45 pack-year smoking history. She has never used smokeless tobacco. She reports that she does not drink alcohol or use illicit drugs.  Allergies  Allergen Reactions  . Citrus     Burning in stomach   . Doxycycline     Caused pt daily headaches to be more severe   . Penicillins Hives, Nausea And Vomiting and Swelling    Has patient had a PCN reaction causing immediate rash, facial/tongue/throat swelling, SOB or lightheadedness with hypotension: Yes Has patient had a PCN reaction causing severe rash involving mucus membranes or skin necrosis: unknown Has patient had a PCN reaction that required hospitalization No Has patient had a PCN reaction occurring within the last 10 years: No If all of the above answers are "NO", then may proceed with Cephalosporin use.     Medications: I have reviewed the  patient's current medications.  ROS: History obtained from the patient  General ROS: negative for - chills, fatigue, fever, night sweats, weight gain or weight loss Psychological ROS: negative for - behavioral disorder, hallucinations, memory difficulties, mood swings or suicidal ideation Ophthalmic ROS: negative for - blurry vision, double vision, eye pain or loss of vision ENT ROS: negative for - epistaxis, nasal discharge, oral lesions, sore throat, tinnitus or vertigo Allergy and Immunology ROS: negative for - hives or itchy/watery eyes Hematological and Lymphatic ROS: negative for - bleeding problems, bruising or swollen lymph nodes Endocrine ROS: negative for - galactorrhea, hair pattern changes, polydipsia/polyuria or temperature intolerance Respiratory ROS: negative for - cough, hemoptysis, shortness of breath or wheezing Cardiovascular ROS: negative for - chest pain, dyspnea on exertion, edema or irregular heartbeat Gastrointestinal ROS: negative for - abdominal pain, diarrhea, hematemesis, nausea/vomiting or stool incontinence Genito-Urinary ROS: negative for - dysuria, hematuria, incontinence or urinary frequency/urgency Musculoskeletal ROS: negative for - joint swelling or muscular weakness Neurological ROS: as noted in HPI Dermatological ROS: negative for rash and skin lesion changes  Physical Examination: Blood pressure 110/49, pulse 85, temperature 98.3 F (36.8 C), temperature source Oral, resp. rate 18, height '5\' 10"'$  (1.778 m), weight 56.7 kg (125 lb), SpO2 94 %.  Neurological Examination Mental Status: Alert, oriented, thought content appropriate.  Speech fluent without evidence of aphasia.  Able to follow 3 step commands without difficulty. Cranial Nerves: II: Discs flat bilaterally;  Visual fields grossly normal, pupils equal, round, reactive to light and accommodation III,IV, VI: ptosis not present, extra-ocular motions intact bilaterally V,VII: smile symmetric,  facial light touch sensation normal bilaterally VIII: hearing normal bilaterally IX,X: gag reflex present XI: bilateral shoulder shrug XII: midline tongue extension Motor: Right : Upper extremity   5/5    Left:     Upper extremity   5/5  Lower extremity   5/5     Lower extremity   5/5 Tone and bulk:normal tone throughout; no atrophy noted Sensory: Pinprick and light touch intact throughout, bilaterally Deep Tendon Reflexes: 1+ and symmetric throughout Plantars: Right: downgoing   Left: downgoing Cerebellar: normal finger-to-nose, normal rapid alternating movements and normal heel-to-shin test Gait: not tested.       Laboratory Studies:   Basic Metabolic Panel:  Recent Labs Lab 01/29/15 1803 01/30/15 0357  NA 140 142  K 3.9 3.9  CL 107 108  CO2 27 27  GLUCOSE 94 128*  BUN 14 16  CREATININE 0.71 0.62  CALCIUM 8.3* 8.1*    Liver Function Tests:  Recent Labs Lab 01/29/15 1803  AST 39  ALT 19  ALKPHOS 62  BILITOT 0.5  PROT 6.8  ALBUMIN 3.9   No results for input(s): LIPASE, AMYLASE in the last 168 hours. No results for input(s): AMMONIA in the last 168 hours.  CBC:  Recent Labs Lab 01/29/15 1803 01/30/15 0357  WBC 26.8* 21.7*  NEUTROABS 8.3*  --   HGB 13.9 12.6  HCT 42.2 38.1  MCV 93.3 94.3  PLT 93* 81*    Cardiac Enzymes: No results for input(s): CKTOTAL, CKMB, CKMBINDEX, TROPONINI in the last 168 hours.  BNP: Invalid input(s): POCBNP  CBG: No results for input(s): GLUCAP in the last 168 hours.  Microbiology: Results for orders placed or performed during the hospital encounter of 01/29/15  Culture, blood (routine x 2)     Status: None (Preliminary result)   Collection Time: 01/29/15  7:02 PM  Result Value Ref Range Status   Specimen Description BLOOD LEFT ARM  Final   Special Requests BOTTLES DRAWN AEROBIC AND ANAEROBIC [place]  Final   Culture NO GROWTH 2 DAYS  Final   Report Status PENDING  Incomplete  Culture, blood (routine x 2)      Status: None (Preliminary result)   Collection Time: 01/29/15  7:06 PM  Result Value Ref Range Status   Specimen Description BLOOD RIGHT ARM  Final   Special Requests BOTTLES DRAWN AEROBIC AND ANAEROBIC 6CC  Final   Culture NO GROWTH 2 DAYS  Final   Report Status PENDING  Incomplete  Culture, sputum-assessment     Status: None   Collection Time: 01/30/15  2:49 PM  Result Value Ref Range Status   Specimen Description EXPECTORATED SPUTUM  Final   Special Requests Normal  Final   Sputum evaluation THIS SPECIMEN IS ACCEPTABLE FOR SPUTUM CULTURE  Final   Report Status 01/30/2015 FINAL  Final  Culture, respiratory (NON-Expectorated)     Status: None (Preliminary result)   Collection Time: 01/30/15  2:49 PM  Result Value Ref Range Status   Specimen Description EXPECTORATED SPUTUM  Final   Special Requests Normal Reflexed from [id number]  Final   Gram Stain PENDING  Incomplete   Culture   Final    RARE GRAM NEGATIVE RODS IDENTIFICATION AND SUSCEPTIBILITIES TO FOLLOW    Report Status PENDING  Incomplete    Coagulation Studies: No results for input(s): LABPROT, INR in the last 72  hours.  Urinalysis:  Recent Labs Lab 01/29/15 1747  COLORURINE YELLOW*  LABSPEC 1.011  PHURINE 5.0  GLUCOSEU NEGATIVE  HGBUR 1+*  BILIRUBINUR NEGATIVE  KETONESUR NEGATIVE  PROTEINUR NEGATIVE  NITRITE NEGATIVE  LEUKOCYTESUR NEGATIVE    Lipid Panel:  No results found for: CHOL, TRIG, HDL, CHOLHDL, VLDL, LDLCALC  HgbA1C: No results found for: HGBA1C  Urine Drug Screen:     Component Value Date/Time   LABOPIA NONE DETECTED 01/29/2015 1747   LABBENZ POSITIVE* 01/29/2015 1747   AMPHETMU NONE DETECTED 01/29/2015 1747   THCU POSITIVE* 01/29/2015 1747   LABBARB NONE DETECTED 01/29/2015 1747    Alcohol Level:  Recent Labs Lab 01/29/15 1803  ETH <5     Imaging: Dg Chest 2 View  01/29/2015  CLINICAL DATA:  63 year old female with history of headaches starting this morning and generalize weakness  with mild shortness of breath. History of asthma and COPD. EXAM: CHEST  2 VIEW COMPARISON:  Chest x-ray 06/21/2013. FINDINGS: Emphysematous changes are again noted in the lungs bilaterally. Patchy airspace consolidation in the right lower lobe, where there also appears to be pronounced peribronchial cuffing. No pleural effusions. No evidence of pulmonary edema. Extensive bilateral apical pleuroparenchymal thickening, similar to prior examinations, most compatible with chronic post infectious or inflammatory scarring. Atherosclerotic calcifications in the thoracic aorta. Heart size is normal. The patient is rotated to the left on today's exam, resulting in distortion of the mediastinal contours and reduced diagnostic sensitivity and specificity for mediastinal pathology. IMPRESSION: 1. Findings in the right lower lobe concerning for acute bronchopneumonia or sequela of recent aspiration. Followup PA and lateral chest X-ray is recommended in 3-4 weeks following trial of antibiotic therapy to ensure resolution and exclude underlying malignancy. 2. Chronic emphysematous changes in bilateral apical scarring, similar to prior studies. 3. Atherosclerosis. Electronically Signed   By: Vinnie Langton M.D.   On: 01/29/2015 18:11   Ct Head Wo Contrast  01/29/2015  CLINICAL DATA:  Weakness, headache, lethargy EXAM: CT HEAD WITHOUT CONTRAST TECHNIQUE: Contiguous axial images were obtained from the base of the skull through the vertex without intravenous contrast. COMPARISON:  12/22/2012 FINDINGS: No skull fracture is noted. No intracranial hemorrhage, mass effect or midline shift. There is subtle area of decreased attenuation in left parietal region peripheral axial image 18. Similar decreased attenuation in axial image 19. Evolving ischemia/infarct in left MCA territory cannot be excluded. Clinical correlation is necessary. Follow-up MRI with diffusion imaging is recommended as clinically warranted. There is no  intraventricular hemorrhage. IMPRESSION: No intracranial hemorrhage, mass effect or midline shift. There is subtle area of decreased attenuation in left parietal region peripheral axial image 18. Similar small area decreased attenuation in axial image 19. Evolving ischemia/infarct in left MCA territory cannot be excluded. Clinical correlation is necessary. Follow-up MRI with diffusion imaging is recommended as clinically warranted. There is no intraventricular hemorrhage. These results were called by telephone at the time of interpretation on 01/29/2015 at 6:12 pm to Dr. Francene Castle , who verbally acknowledged these results. Electronically Signed   By: Lahoma Crocker M.D.   On: 01/29/2015 18:12   Mr Brain Wo Contrast  01/31/2015  CLINICAL DATA:  Right-sided headache for the last 6 months. EXAM: MRI HEAD WITHOUT CONTRAST TECHNIQUE: Multiplanar, multiecho pulse sequences of the brain and surrounding structures were obtained without intravenous contrast. COMPARISON:  Head CT 01/29/2015.  MRI 12/22/2012. FINDINGS: Diffusion imaging does not show any acute or subacute infarction. The brainstem and cerebellum are normal. Within the cerebral  hemispheres, there are scattered foci of abnormal FLAIR and T2 signal within the deep and subcortical white matter. These are progressive since 2014. No evidence of cortical or large vessel territory infarction. No mass lesion, hemorrhage, hydrocephalus or extra-axial collection. No pituitary mass. No inflammatory sinus disease. No skull or skullbase lesion. IMPRESSION: Abnormal foci of T2 and FLAIR signal within the cerebral white matter, progressive since 2014. The appearance could be due to demyelinating disease/ multiple sclerosis. The differential diagnosis does include progressive small-vessel ischemic changes and other causes of multifocal white matter insults including vasculitis. None of the abnormalities show restricted diffusion or acute features. Electronically Signed    By: Nelson Chimes M.D.   On: 01/31/2015 09:34     Assessment/Plan:  64 y.o. female Caucasian female with a history of tobacco abuse, COPD, emphysema who presents to the hospital with cough, shortness of breath, wheezing and sputum production. She was also noted to have altered mental status, confusion and generalized weakness. She was noted to be hypoxic and her chest x-ray revealed pneumonia.  Now back to baseline Neurology consulted for abnormal MRI consistent with hyperdensities on Flair and T2.   - MRI changes are likely in setting of treatment of CLL and white matter changes in setting of HTN - this is very unlikely to be due to Multiple sclerosis.   - d/c planning from neuro perspective. No further imaging.   Leotis Pain  01/31/2015, 12:58 PM

## 2015-01-31 NOTE — Care Management (Addendum)
Message left with Tiffany at [facility] at [facility] for follow up appointment. Patient aware. PT recommending OPPT. No further RNCM needs. I will schedule appointment for patient at [facility] when/if they call me back. Patient aware that she will need to contact her PCP to make appointment if I do not call her back with appointment. Patient phone: [phone]. Patient agrees. No further RNCM needs.   Post discharge: received callback from Creola at [facility]: Appointment made for 02/15/15 at 1:30PM.Patient aware and agrees.

## 2015-01-31 NOTE — Progress Notes (Signed)
ANTIBIOTIC CONSULT NOTE - FOLLOW UP  Pharmacy Consult for Levofloxacin  Indication: pneumonia  Allergies  Allergen Reactions  . Citrus     Burning in stomach   . Doxycycline     Caused pt daily headaches to be more severe   . Penicillins Hives, Nausea And Vomiting and Swelling    Has patient had a PCN reaction causing immediate rash, facial/tongue/throat swelling, SOB or lightheadedness with hypotension: Yes Has patient had a PCN reaction causing severe rash involving mucus membranes or skin necrosis: unknown Has patient had a PCN reaction that required hospitalization No Has patient had a PCN reaction occurring within the last 10 years: No If all of the above answers are "NO", then may proceed with Cephalosporin use.     Patient Measurements: Height: '5\' 10"'$  (177.8 cm) Weight: 125 lb (56.7 kg) IBW/kg (Calculated) : 68.5   Vital Signs: Temp: 98.3 F (36.8 C) (10/17 0805) Temp Source: Oral (10/17 0805) BP: 110/49 mmHg (10/17 0805) Pulse Rate: 85 (10/17 0805) Intake/Output from previous day: 10/16 0701 - 10/17 0700 In: 150 [IV Piggyback:150] Out: -  Intake/Output from this shift: Total I/O In: 120 [P.O.:120] Out: -   Labs:  Recent Labs  01/29/15 1803 01/30/15 0357  WBC 26.8* 21.7*  HGB 13.9 12.6  PLT 93* 81*  CREATININE 0.71 0.62   Estimated Creatinine Clearance: 62.8 mL/min (by C-G formula based on Cr of 0.62). No results for input(s): VANCOTROUGH, VANCOPEAK, VANCORANDOM, GENTTROUGH, GENTPEAK, GENTRANDOM, TOBRATROUGH, TOBRAPEAK, TOBRARND, AMIKACINPEAK, AMIKACINTROU, AMIKACIN in the last 72 hours.   Microbiology: Recent Results (from the past 720 hour(s))  Culture, blood (routine x 2)     Status: None (Preliminary result)   Collection Time: 01/29/15  7:02 PM  Result Value Ref Range Status   Specimen Description BLOOD LEFT ARM  Final   Special Requests BOTTLES DRAWN AEROBIC AND ANAEROBIC [address]  Final   Culture NO GROWTH 2 DAYS  Final   Report Status PENDING   Incomplete  Culture, blood (routine x 2)     Status: None (Preliminary result)   Collection Time: 01/29/15  7:06 PM  Result Value Ref Range Status   Specimen Description BLOOD RIGHT ARM  Final   Special Requests BOTTLES DRAWN AEROBIC AND ANAEROBIC 6CC  Final   Culture NO GROWTH 2 DAYS  Final   Report Status PENDING  Incomplete  Culture, sputum-assessment     Status: None   Collection Time: 01/30/15  2:49 PM  Result Value Ref Range Status   Specimen Description EXPECTORATED SPUTUM  Final   Special Requests Normal  Final   Sputum evaluation THIS SPECIMEN IS ACCEPTABLE FOR SPUTUM CULTURE  Final   Report Status 01/30/2015 FINAL  Final    Anti-infectives    Start     Dose/Rate Route Frequency Ordered Stop   01/30/15 2000  levofloxacin (LEVAQUIN) IVPB 750 mg     750 mg 100 mL/hr over 90 Minutes Intravenous Every 24 hours 01/29/15 2108     01/29/15 1900  levofloxacin (LEVAQUIN) IVPB 750 mg     750 mg 100 mL/hr over 90 Minutes Intravenous  Once 01/29/15 1851 01/29/15 2046      Assessment:64 yo female brought ED via EMS with weakness, headache, cough and congestion. Pharmacy consulted on 10/15 for levofloxacin dosing and monitoring.   CrCl: 62.8, CXR on 10/15 showing RLL PNA.    Plan:  Will continue levofloxacin '750mg'$  daily based on CrCl remaining >32m/min. Pharmacy will continue to monitor and adjust medication based on  renal function and labs.   Nancy Fetter, PharmD Pharmacy Resident

## 2015-01-31 NOTE — Telephone Encounter (Signed)
new patient appt-s/w Marshell Garfinkel nurse case mang [phone] and gave appt for 11/04 @ 1:30 w/Dr. Irene Limbo

## 2015-01-31 NOTE — Evaluation (Signed)
Physical Therapy Evaluation Patient Details Name: Sheri Ayala MRN: [id number] DOB: May 25, 1950 Today's Date: 01/31/2015   History of Present Illness  Pt arrived with complaints of cough, SOB, generalized weakness, and confusion. She was admitted for PNA, COPD exaccerbation, and acute on chronic respiratory failure. PMH: CLL and COPD. Pt denies use of supplemental O2 at home. At baseline she is independent with ADLs/IADLs with occasional use of single point cane for extended ambulation distances. No reported falls in the last 12 months.   Clinical Impression  No strength or functional mobility deficits identified during PT evaluation. No focal numbness/tingling and pt reports her mobility has returned to baseline. Pt is able to demonstrate independent bed mobility and transfers on this date. Ambulation with supervision only and no assistive device. Pt scored 12/12 on modified DGI demonstrating adequate balance with ambulation. She does have some higher level balance deficits in single leg stance and modified sharpened Rhomberg positions. Pt would benefit from OP PT for balance training and fall risk reduction. She would need to obtain order from PCP for these services. Pt will benefit from skilled PT services to address deficits in strength, balance, and mobility in order to return to full function at home.     Follow Up Recommendations Outpatient PT (For balance and fall risk reduction)    Equipment Recommendations  None recommended by PT    Recommendations for Other Services       Precautions / Restrictions Precautions Precautions: None Restrictions Weight Bearing Restrictions: No      Mobility  Bed Mobility Overal bed mobility: Independent             General bed mobility comments: Good speed and sequencing noted  Transfers Overall transfer level: Independent Equipment used: None             General transfer comment: Good power and sequencing  noted  Ambulation/Gait Ambulation/Gait assistance: Supervision Ambulation Distance (Feet): 100 Feet Assistive device: None Gait Pattern/deviations: WFL(Within Functional Limits)   Gait velocity interpretation: >2.62 ft/sec, indicative of independent community ambulator General Gait Details: Good step length and gait speed. Pt able to perform horizonta/vertical head turns without LOB. Modified DGI: 12/12.   Stairs            Wheelchair Mobility    Modified Rankin (Stroke Patients Only)       Balance Overall balance assessment: Needs assistance   Sitting balance-Leahy Scale: Good       Standing balance-Leahy Scale: Good Standing balance comment: single leg stance grossly 2 seconds bilaterally. Rhomberg 15 seconds eyes closed, modified sharpened Rhomberg 8 seconds eyes open prior to LOB                             Pertinent Vitals/Pain Pain Assessment: No/denies pain    Home Living Family/patient expects to be discharged to:: Private residence Living Arrangements: Non-relatives/Friends Available Help at Discharge: Friend(s) Type of Home: Mobile home Home Access: Stairs to enter Entrance Stairs-Rails: Right Entrance Stairs-Number of Steps: 2 Home Layout: One level Home Equipment: Cane - single point Additional Comments: No BSC, rolling walker, wheelchair    Prior Function Level of Independence: Independent with assistive device(s)         Comments: Intermittent use of single point cane     Hand Dominance   Dominant Hand: Right    Extremity/Trunk Assessment   Upper Extremity Assessment: Overall WFL for tasks assessed (Grossly 4/5 throughout)  Lower Extremity Assessment: Overall WFL for tasks assessed (Grossly 4 to 4+/5 throughout. Pt denies numbness/tingling)         Communication   Communication: No difficulties  Cognition Arousal/Alertness: Awake/alert Behavior During Therapy: WFL for tasks assessed/performed Overall  Cognitive Status: Within Functional Limits for tasks assessed                      General Comments      Exercises        Assessment/Plan    PT Assessment Patient needs continued PT services  PT Diagnosis Difficulty walking   PT Problem List Decreased balance  PT Treatment Interventions Gait training;Stair training;Balance training;Neuromuscular re-education   PT Goals (Current goals can be found in the Care Plan section) Acute Rehab PT Goals Patient Stated Goal: "I would like to improve my balance" PT Goal Formulation: With patient Time For Goal Achievement: 02/14/15 Potential to Achieve Goals: Good    Frequency Min 2X/week   Barriers to discharge        Co-evaluation               End of Session Equipment Utilized During Treatment: Gait belt Activity Tolerance: Patient tolerated treatment well Patient left: in bed;with call bell/phone within reach (Low fall risk, MD at bedside)           Time: 1120-1140 PT Time Calculation (min) (ACUTE ONLY): 20 min   Charges:   PT Evaluation $Initial PT Evaluation Tier I: 1 Procedure     PT G Codes:       Lyndel Safe Laiylah Roettger PT, DPT   Floye Fesler 01/31/2015, 11:48 AM

## 2015-02-03 LAB — CULTURE, BLOOD (ROUTINE X 2)
Culture: NO GROWTH
Culture: NO GROWTH

## 2015-02-03 LAB — CULTURE, RESPIRATORY W GRAM STAIN: Special Requests: NORMAL

## 2015-02-03 LAB — CULTURE, RESPIRATORY

## 2015-02-15 ENCOUNTER — Encounter: Payer: Self-pay | Admitting: Hematology

## 2015-02-15 ENCOUNTER — Other Ambulatory Visit: Payer: Self-pay | Admitting: *Deleted

## 2015-02-15 ENCOUNTER — Telehealth: Payer: Self-pay | Admitting: Oncology

## 2015-02-15 ENCOUNTER — Encounter: Payer: Medicare HMO | Admitting: Hematology

## 2015-02-15 NOTE — Telephone Encounter (Signed)
Gave adn printed appt sched and avs fo rpt for NOV °

## 2015-02-24 ENCOUNTER — Ambulatory Visit: Payer: Medicare HMO | Admitting: Oncology

## 2015-02-24 ENCOUNTER — Telehealth: Payer: Self-pay | Admitting: Oncology

## 2015-02-24 NOTE — Telephone Encounter (Signed)
Patient called to r/s today's appointment. Gave patient appointment for 12/23. Left message for desk nurse.

## 2015-03-04 ENCOUNTER — Ambulatory Visit: Payer: Medicare HMO | Admitting: Neurology

## 2015-03-04 NOTE — Progress Notes (Signed)
This encounter was created in error - please disregard.

## 2015-03-30 ENCOUNTER — Encounter: Payer: Self-pay | Admitting: Emergency Medicine

## 2015-03-30 ENCOUNTER — Emergency Department
Admission: EM | Admit: 2015-03-30 | Discharge: 2015-03-30 | Disposition: A | Discharge status: Home / Self Care | Payer: Medicare HMO | Attending: Student | Admitting: Student

## 2015-03-30 ENCOUNTER — Emergency Department: Payer: Medicare HMO

## 2015-03-30 ENCOUNTER — Other Ambulatory Visit: Payer: Self-pay | Admitting: *Deleted

## 2015-03-30 DIAGNOSIS — F17210 Nicotine dependence, cigarettes, uncomplicated: Secondary | ICD-10-CM | POA: Diagnosis not present

## 2015-03-30 DIAGNOSIS — R059 Cough, unspecified: Secondary | ICD-10-CM

## 2015-03-30 DIAGNOSIS — Z79899 Other long term (current) drug therapy: Secondary | ICD-10-CM | POA: Insufficient documentation

## 2015-03-30 DIAGNOSIS — R079 Chest pain, unspecified: Secondary | ICD-10-CM | POA: Insufficient documentation

## 2015-03-30 DIAGNOSIS — R05 Cough: Secondary | ICD-10-CM

## 2015-03-30 DIAGNOSIS — Z7951 Long term (current) use of inhaled steroids: Secondary | ICD-10-CM | POA: Insufficient documentation

## 2015-03-30 DIAGNOSIS — F329 Major depressive disorder, single episode, unspecified: Secondary | ICD-10-CM

## 2015-03-30 DIAGNOSIS — F32A Depression, unspecified: Secondary | ICD-10-CM

## 2015-03-30 DIAGNOSIS — Z880 Allergy status to penicillin: Secondary | ICD-10-CM | POA: Diagnosis not present

## 2015-03-30 LAB — BASIC METABOLIC PANEL
ANION GAP: 5 (ref 5–15)
BUN: 12 mg/dL (ref 6–20)
CALCIUM: 8.5 mg/dL — AB (ref 8.9–10.3)
CO2: 28 mmol/L (ref 22–32)
Chloride: 108 mmol/L (ref 101–111)
Creatinine, Ser: 0.42 mg/dL — ABNORMAL LOW (ref 0.44–1.00)
Glucose, Bld: 92 mg/dL (ref 65–99)
Potassium: 4.8 mmol/L (ref 3.5–5.1)
Sodium: 141 mmol/L (ref 135–145)

## 2015-03-30 LAB — CBC
HCT: 41 % (ref 35.0–47.0)
HEMOGLOBIN: 13.2 g/dL (ref 12.0–16.0)
MCH: 29.8 pg (ref 26.0–34.0)
MCHC: 32.2 g/dL (ref 32.0–36.0)
MCV: 92.7 fL (ref 80.0–100.0)
Platelets: 75 10*3/uL — ABNORMAL LOW (ref 150–440)
RBC: 4.42 MIL/uL (ref 3.80–5.20)
RDW: 14.2 % (ref 11.5–14.5)
WBC: 33.8 10*3/uL — AB (ref 3.6–11.0)

## 2015-03-30 LAB — TROPONIN I

## 2015-03-30 MED ORDER — IOHEXOL 350 MG/ML SOLN
80.0000 mL | Freq: Once | INTRAVENOUS | Status: AC | PRN
  Administered 2015-03-30: 100 mL via INTRAVENOUS

## 2015-03-30 MED ORDER — SODIUM CHLORIDE 0.9 % IV BOLUS (SEPSIS)
500.0000 mL | Freq: Once | INTRAVENOUS | Status: AC
  Administered 2015-03-30: 500 mL via INTRAVENOUS

## 2015-03-30 MED ORDER — ASPIRIN 81 MG PO CHEW
324.0000 mg | CHEWABLE_TABLET | Freq: Once | ORAL | Status: AC
  Administered 2015-03-30: 324 mg via ORAL
  Filled 2015-03-30: qty 4

## 2015-03-30 MED ORDER — OXYCODONE HCL 5 MG PO TABS: 5.0000 mg | ORAL_TABLET | Freq: Four times a day (QID) | ORAL | Status: DC | PRN

## 2015-03-30 NOTE — ED Provider Notes (Signed)
[facility] Emergency Department Provider Note  ____________________________________________  Time seen: Approximately 1:15 PM  I have reviewed the triage vital signs and the nursing notes.   HISTORY  Chief Complaint Depression and Cough    HPI Sheri Ayala is a 64 y.o. female with chronic leukocytic leukemia, COPD who presents for evaluation of 2 days left-sided chest pain, generally gradual onset, intermittent, currently resolved. Patient reports that over the past month she has had persistent cough since treatment for pneumonia. She reports that yesterday morning on awakening she developed pain in the left lateral chest and the axilla. It resolved throughout the day. This morning, she again awoke at 7 AM with the same aching pain. It was not worsened with exertion, did not radiate, and has resolved with time. It was worse with movement. It was pleuritic in nature. She has had no fever. No increased shortness of breath from baseline. No abdominal pain, vomiting or diarrhea. She is also endorsing anhedonia with depression since the death of her father. She denies suicidal ideation, homicidal ideation or audiovisual hallucinations.   Past Medical History  Diagnosis Date  . Headache(784.0)   . Pleurisy   . Bronchospasm   . Chronic lymphocytic leukemia ([place])   . Pneumonia   . Thrombocytopenia ([place])   . Olecranon bursitis of left elbow November 2012.  . Tobacco abuse   . Frontal sinusitis November 2012  . COPD (chronic obstructive pulmonary disease) ([place])   . Asthma     Patient Active Problem List   Diagnosis Date Noted  . Acute on chronic respiratory failure with hypoxia ([place]) 01/30/2015  . Right lower lobe pneumonia 01/30/2015  . Generalized weakness 01/30/2015  . COPD exacerbation ([place]) 01/29/2015  . SOB (shortness of breath) 12/24/2012  . Dizziness 12/22/2012  . COPD with acute exacerbation ([place]) 12/22/2012  . Anemia 03/28/2011  . Cellulitis of both  external ears 03/27/2011  . Headache in front of head 03/27/2011  . Thrombocytopenia ([place]) 03/27/2011  . Acute frontal sinusitis 03/10/2011  . Cellulitis of elbow 03/09/2011  . Bursitis of elbow 03/09/2011  . CLL (chronic lymphocytic leukemia) ([place]) 03/09/2011  . Tobacco abuse 03/09/2011    Past Surgical History  Procedure Laterality Date  . Abdominal hysterectomy      Current Outpatient Rx  Name  Route  Sig  Dispense  Refill  . albuterol (PROVENTIL) (2.5 MG/3ML) 0.083% nebulizer solution   Nebulization   Take 3 mLs (2.5 mg total) by nebulization every 4 (four) hours as needed for wheezing or shortness of breath.   75 mL   0   . ALPRAZolam (XANAX) 0.5 MG tablet   Oral   Take 0.5 mg by mouth 2 (two) times daily as needed for anxiety or sleep.          . Aspirin-Acetaminophen-Caffeine (GOODY HEADACHE PO)   Oral   Take 1 packet by mouth 2 (two) times daily as needed (for pain).          . budesonide-formoterol (SYMBICORT) 160-4.5 MCG/ACT inhaler   Inhalation   Inhale 2 puffs into the lungs 2 (two) times daily.   1 Inhaler   12   . doxycycline (VIBRAMYCIN) 100 MG capsule   Oral   Take 100 mg by mouth 2 (two) times daily.          Marland Kitchen loratadine (CLARITIN) 10 MG tablet   Oral   Take 10 mg by mouth daily as needed.          Marland Kitchen  nicotine (NICOTROL) 10 MG inhaler   Inhalation   Inhale 1 cartridge (1 continuous puffing total) into the lungs as needed for smoking cessation.   42 each   0   . oxyCODONE (ROXICODONE) 5 MG immediate release tablet   Oral   Take 1 tablet (5 mg total) by mouth every 6 (six) hours as needed for moderate pain. Do not drive while taking this medication.   8 tablet   0   . SUMAtriptan (IMITREX) 100 MG tablet   Oral   Take 50-100 mg by mouth every 2 (two) hours as needed.            Allergies Citrus; Doxycycline; and Penicillins  Family History  Problem Relation Age of Onset  . Aneurysm Mother   . Liver disease Father      Social History Social History  Substance Use Topics  . Smoking status: Current Every Day Smoker -- 1.00 packs/day for 45 years    Types: Cigarettes, E-cigarettes  . Smokeless tobacco: Never Used  . Alcohol Use: No    Review of Systems Constitutional: No fever/chills Eyes: No visual changes. ENT: No sore throat. Cardiovascular: + chest pain. Respiratory: Denies increased  shortness of breath. Gastrointestinal: No abdominal pain.  No nausea, no vomiting.  No diarrhea.  No constipation. Genitourinary: Negative for dysuria. Musculoskeletal: Negative for back pain. Skin: Negative for rash. Neurological: Negative for headaches, focal weakness or numbness.  10-point ROS otherwise negative.  ____________________________________________   PHYSICAL EXAM:  VITAL SIGNS: ED Triage Vitals  Enc Vitals Group     BP 03/30/15 1057 121/91 mmHg     Pulse Rate 03/30/15 1057 82     Resp 03/30/15 1057 16     Temp 03/30/15 1057 98.3 F (36.8 C)     Temp Source 03/30/15 1057 Oral     SpO2 03/30/15 1057 94 %     Weight 03/30/15 1057 128 lb (58.06 kg)     Height 03/30/15 1057 '5\' 10"'$  (1.778 m)     Head Cir --      Peak Flow --      Pain Score 03/30/15 1109 8     Pain Loc --      Pain Edu? --      Excl. in [place]? --     Constitutional: Alert and oriented. Well appearing and in no acute distress. Eyes: Conjunctivae are normal. PERRL. EOMI. Head: Atraumatic. Nose: No congestion/rhinnorhea. Mouth/Throat: Mucous membranes are moist.  Oropharynx non-erythematous. Neck: No stridor.   Cardiovascular: Normal rate, regular rhythm. Grossly normal heart sounds.  Good peripheral circulation. Respiratory: Normal respiratory effort.  No retractions. Lungs CTAB. Gastrointestinal: Soft and nontender. No distention.  No CVA tenderness. Genitourinary: deferred Musculoskeletal: No lower extremity tenderness nor edema.  No joint effusions. Tenderness to palpation throughout the chest wall. Neurologic:   Normal speech and language. No gross focal neurologic deficits are appreciated. No gait instability. Skin:  Skin is warm, dry and intact. No rash noted. Psychiatric: Mood is mildly depressed and affect is normal. Speech and behavior are normal.  ____________________________________________   LABS (all labs ordered are listed, but only abnormal results are displayed)  Labs Reviewed  BASIC METABOLIC PANEL - Abnormal; Notable for the following:    Creatinine, Ser 0.42 (*)    Calcium 8.5 (*)    All other components within normal limits  CBC - Abnormal; Notable for the following:    WBC 33.8 (*)    Platelets 75 (*)    All  other components within normal limits  TROPONIN I   ____________________________________________  EKG  ED ECG REPORT I, Joanne Gavel, the attending physician, personally viewed and interpreted this ECG.   Date: 03/30/2015  EKG Time: 11:14  Rate: 85  Rhythm: normal EKG, normal sinus rhythm  Axis: normal  Intervals:none  ST&T Change: No acute ST elevation  ____________________________________________  RADIOLOGY  CXR  IMPRESSION: 1. Airway thickening is present, suggesting bronchitis or reactive airways disease. No airspace opacity is identified. 2. Biapical pleural parenchymal scarring and emphysema. 3. Dextroconvex thoracic scoliosis.    CTA chest IMPRESSION: 1. No evidence of acute pulmonary embolus. 2. Chronic emphysema. Chronic airway disease. Less retained secretions in the proximal right airway compared to 2014. No acute pneumonia. Regressed right costophrenic angle pulmonary nodule (benign). 3. Indistinct mediastinal lymph node tissue appears stable since 2014. Difficult to exclude increased soft tissue in the upper abdomen at the level of the pancreas, but this could well be artifact. CT Abdomen and Pelvis would be necessary to evaluate further. 4. 2 cm right adrenal nodule has intermediate density, but is chronic and very likely  benign (was 1 cm 7 years ago).  ____________________________________________   PROCEDURES  Procedure(s) performed: None  Critical Care performed: No  ____________________________________________   INITIAL IMPRESSION / ASSESSMENT AND PLAN / ED COURSE  Pertinent labs & imaging results that were available during my care of the patient were reviewed by me and considered in my medical decision making (see chart for details).  Sheri Ayala is a 64 y.o. female with chronic leukocytic leukemia, COPD who presents for evaluation of 2 days left-sided chest pain, generally gradual onset, intermittent, currently resolved. On exam, she is very well-appearing and in no acute distress. Vital signs stable, she is afebrile. Her pain is completely resolved at this time. EKG reassuring. Troponin which was obtained 5 hours after onset of pain is negative. Doubt ACS. CTA chest was obtained given history of leukemia, pleuritic nature of her chest pain. No evidence of PE, chronic findings of COPD are noted. Clinical picture is not consistent with acute aortic dissection. Pain may be musculoskeletal in nature given that it is worse with movement but it has resolved at this time. We discussed discharge with a very short course of oxycodone pain recurs, knee for close PCP follow-up and she is, trouble with the discharge plan. Additionally she is having symptoms of depression without any indication for commitment. Will discharge with outpatient psychiatric resources. Aspirin given. ____________________________________________   FINAL CLINICAL IMPRESSION(S) / ED DIAGNOSES  Final diagnoses:  Chest pain, unspecified chest pain type  Cough  Depression      Joanne Gavel, MD 03/30/15 1511

## 2015-03-30 NOTE — ED Notes (Addendum)
Pt was admitted one month ago for PNA. Thinks she still has PNA bc wakes up every day with cough and lungs still hurt.  C/o she has to hold left chest to breath; pain to this side.  Also c/o depression.  Denies SI/HI.

## 2015-04-04 ENCOUNTER — Telehealth: Payer: Self-pay | Admitting: Oncology

## 2015-04-04 NOTE — Telephone Encounter (Signed)
per BS moved from 12/23 to 1/20 per pof for #[id number]. Spoke with patient she is aware.

## 2015-04-08 ENCOUNTER — Ambulatory Visit: Payer: Medicare HMO | Admitting: Oncology

## 2015-04-13 ENCOUNTER — Ambulatory Visit: Payer: Medicare HMO | Admitting: Neurology

## 2015-05-06 ENCOUNTER — Telehealth: Payer: Self-pay | Admitting: Oncology

## 2015-05-06 ENCOUNTER — Ambulatory Visit (HOSPITAL_BASED_OUTPATIENT_CLINIC_OR_DEPARTMENT_OTHER): Payer: Medicare HMO

## 2015-05-06 ENCOUNTER — Telehealth: Payer: Self-pay | Admitting: *Deleted

## 2015-05-06 ENCOUNTER — Ambulatory Visit (HOSPITAL_BASED_OUTPATIENT_CLINIC_OR_DEPARTMENT_OTHER): Payer: Medicare HMO | Admitting: Oncology

## 2015-05-06 VITALS — BP 155/76 | HR 86 | Temp 98.3°F | Resp 18 | Ht 70.0 in | Wt 130.5 lb

## 2015-05-06 DIAGNOSIS — D6959 Other secondary thrombocytopenia: Secondary | ICD-10-CM | POA: Diagnosis not present

## 2015-05-06 DIAGNOSIS — Z8701 Personal history of pneumonia (recurrent): Secondary | ICD-10-CM | POA: Diagnosis not present

## 2015-05-06 DIAGNOSIS — Z23 Encounter for immunization: Secondary | ICD-10-CM | POA: Diagnosis not present

## 2015-05-06 DIAGNOSIS — C9110 Chronic lymphocytic leukemia of B-cell type not having achieved remission: Secondary | ICD-10-CM | POA: Diagnosis not present

## 2015-05-06 LAB — CBC WITH DIFFERENTIAL/PLATELET
BASO%: 0.1 % (ref 0.0–2.0)
Basophils Absolute: 0.1 10*3/uL (ref 0.0–0.1)
EOS ABS: 0 10*3/uL (ref 0.0–0.5)
EOS%: 0.1 % (ref 0.0–7.0)
HCT: 44.5 % (ref 34.8–46.6)
HEMOGLOBIN: 14 g/dL (ref 11.6–15.9)
LYMPH%: 82.3 % — AB (ref 14.0–49.7)
MCH: 29 pg (ref 25.1–34.0)
MCHC: 31.5 g/dL (ref 31.5–36.0)
MCV: 92.2 fL (ref 79.5–101.0)
MONO#: 0.5 10*3/uL (ref 0.1–0.9)
MONO%: 1.2 % (ref 0.0–14.0)
NEUT%: 16.3 % — ABNORMAL LOW (ref 38.4–76.8)
NEUTROS ABS: 6.2 10*3/uL (ref 1.5–6.5)
Platelets: 104 10*3/uL — ABNORMAL LOW (ref 145–400)
RBC: 4.83 10*6/uL (ref 3.70–5.45)
RDW: 14.8 % — AB (ref 11.2–14.5)
WBC: 38.1 10*3/uL — AB (ref 3.9–10.3)
lymph#: 31.3 10*3/uL — ABNORMAL HIGH (ref 0.9–3.3)

## 2015-05-06 LAB — TECHNOLOGIST REVIEW

## 2015-05-06 MED ORDER — PNEUMOCOCCAL 13-VAL CONJ VACC IM SUSP
0.5000 mL | Freq: Once | INTRAMUSCULAR | Status: AC
  Administered 2015-05-06: 0.5 mL via INTRAMUSCULAR
  Filled 2015-05-06: qty 0.5

## 2015-05-06 NOTE — Telephone Encounter (Signed)
Message from pt asking to discuss her CBC results. Returned call, told her labs were stable. PLT mildly low, WBC remains elevated. She voiced understanding.

## 2015-05-06 NOTE — Progress Notes (Signed)
  [facility] OFFICE PROGRESS NOTE   Diagnosis:  CLL  INTERVAL HISTORY:    Ms. Un was last seen at the [facility] in January 2010. She was admitted in October with pneumonia. She reports being admitted to [facility] in December with pneumonia. There is an emergency room visit from 03/30/2015 when she presented with a cough and depression. There is no electronic record of a December hospital admission.  She continues smoking. She reports discomfort at the left chest and feels she may again be developing pneumonia.  She reports a fever of 99 degrees once or twice per week. She has occasional night sweats. She had diplopia last weekend. This has resolved.   No new palpable lymph nodes.  Objective:  Vital signs in last 24 hours:  Blood pressure 155/76, pulse 86, temperature 98.3 F (36.8 C), temperature source Oral, resp. rate 18, height '5\' 10"'$  (1.778 m), weight 130 lb 8 oz (59.194 kg), SpO2 97 %.    HEENT:  Oropharynx without visible mass, neck without mass Lymphatics:  No cervical, supraclavicular, or inguinal nodes. "Shotty "bilateral axillary nodes. Resp:  Distant breath sounds, end inspiratory wheeze at the left upper posterior chest, no respiratory distress Cardio:  Regular rate and rhythm GI:  No hepatosplenomegaly Vascular:  No leg edema  Lab Results:  Lab Results  Component Value Date   WBC 38.1* 05/06/2015   HGB 14.0 05/06/2015   HCT 44.5 05/06/2015   MCV 92.2 05/06/2015   PLT 104* 05/06/2015   NEUTROABS 6.2 05/06/2015    absolute lymphocyte count- 31.3  Medications: I have reviewed the patient's current medications.  Assessment/Plan:  1.  CLL 2.   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   Ongoing tobacco use 4.    History of recurrent pneumonia  Disposition:   Ms.  Warn has chronic lymphocytic leukemia. She appears asymptomatic from the CLL. There is no indication for treating the CLL at present. She remains at high risk of  developing infections. She knows to seek medical attention for symptoms of an infection , including pneumonia.   We administered a 13 valent pneumococcal vaccine today.   I recommended she discontinue smoking.   She will return for an office visit in 4 months.  Betsy Coder, MD  05/06/2015  3:59 PM

## 2015-05-06 NOTE — Telephone Encounter (Signed)
Patient aware of her 5monthappt

## 2015-05-25 ENCOUNTER — Encounter: Payer: Medicare HMO | Admitting: Internal Medicine

## 2015-06-28 ENCOUNTER — Ambulatory Visit (AMBULATORY_SURGERY_CENTER): Payer: Self-pay

## 2015-06-28 VITALS — Ht 70.0 in | Wt 133.0 lb

## 2015-06-28 DIAGNOSIS — Z856 Personal history of leukemia: Secondary | ICD-10-CM

## 2015-06-28 DIAGNOSIS — Z1211 Encounter for screening for malignant neoplasm of colon: Secondary | ICD-10-CM

## 2015-06-28 NOTE — Progress Notes (Signed)
Per pt, no allergies to soy or egg products.Pt not taking any weight loss meds or using  O2 at home. 

## 2015-07-12 ENCOUNTER — Encounter: Payer: Self-pay | Admitting: Internal Medicine

## 2015-07-12 ENCOUNTER — Ambulatory Visit (AMBULATORY_SURGERY_CENTER): Payer: Medicare HMO | Admitting: Internal Medicine

## 2015-07-12 VITALS — BP 119/59 | HR 78 | Temp 96.8°F | Resp 22 | Ht 70.0 in | Wt 130.0 lb

## 2015-07-12 DIAGNOSIS — Z1211 Encounter for screening for malignant neoplasm of colon: Secondary | ICD-10-CM | POA: Diagnosis present

## 2015-07-12 MED ORDER — SODIUM CHLORIDE 0.9 % IV SOLN: 500.0000 mL | INTRAVENOUS | Status: DC

## 2015-07-12 NOTE — Op Note (Signed)
[facility] Patient Name: Sheri Ayala Procedure Date: 07/12/2015 2:23 PM MRN: [id number] Endoscopist: [name] , MD Age: 65 Referring MD:  Date of Birth: 31-Jan-1951 Gender: Female Procedure:                Colonoscopy Indications:              Screening for colorectal malignant neoplasm Medicines:                Monitored Anesthesia Care, Propofol total dose 300                            mg IV Procedure:                Pre-Anesthesia Assessment:                           - Prior to the procedure, a History and Physical                            was performed, and patient medications and                            allergies were reviewed. The patient's tolerance of                            previous anesthesia was also reviewed. The risks                            and benefits of the procedure and the sedation                            options and risks were discussed with the patient.                            All questions were answered, and informed consent                            was obtained. Prior Anticoagulants: The patient has                            taken no previous anticoagulant or antiplatelet                            agents. ASA Grade Assessment: II - A patient with                            mild systemic disease. After reviewing the risks                            and benefits, the patient was deemed in                            satisfactory condition to undergo the procedure.  After obtaining informed consent, the colonoscope                            was passed under direct vision. Throughout the                            procedure, the patient's blood pressure, pulse, and                            oxygen saturations were monitored continuously. The                            Model PCF-H190L [id number] [id number]) scope was introduced                            through the anus and advanced to the the cecum,                 identified by appendiceal orifice and ileocecal                            valve. The Model CF-HQ190L [id number]) scope was                            introduced through the and advanced to the. The                            Model CF-H180AL [phone]) scope was introduced                            through the and advanced to the. The colonoscopy                            was performed without difficulty. The patient                            tolerated the procedure well. The quality of the                            bowel preparation was good. The bowel preparation                            used was Miralax. The ileocecal valve, appendiceal                            orifice, and rectum were photographed. Scope In: 2:44:11 PM Scope Out: 2:59:55 PM Scope Withdrawal Time: 0 hours 7 minutes 52 seconds  Total Procedure Duration: 0 hours 15 minutes 44 seconds  Findings:      The perianal and digital rectal examinations were normal.      An extrinsic severe stenosis was found in the sigmoid colon and was       traversed.      The exam was otherwise without abnormality on direct and retroflexion       views. Complications:  No immediate complications. Estimated Blood Loss:     Estimated blood loss: none. Impression:               - Stricture in the sigmoid colon. Required                            gastroscope to traverse. Probable angulation and                            stenosis from adhesions after hysterectomy in thin                            patient.                           - The examination was otherwise normal on direct                            and retroflexion views.                           - No specimens collected. Recommendation:           - Patient has a contact number available for                            emergencies. The signs and symptoms of potential                            delayed complications were discussed with the                             patient. Return to normal activities tomorrow.                            Written discharge instructions were provided to the                            patient.                           - Resume previous diet.                           - Continue present medications.                           - No repeat colonoscopy due to difficulty of                            procedure requiring gastroscope to traverse                            angulated and stenotic sigmoid from prior                            hysterectomy (suspected) - would do noninvasive  screening at age 33 in 42 yrs. Procedure Code(s):        --- Professional ---                           Q9447, Colorectal cancer screening; colonoscopy on                            individual not meeting criteria for high risk CPT copyright 2016 American Medical Association. All rights reserved. [name], MD 07/12/2015 3:11:04 PM This report has been signed electronically. Number of Addenda: 0 CC Letter to:             Alvester Chou Referring MD:      Alvester Chou

## 2015-07-12 NOTE — Progress Notes (Signed)
A and Ox 3 Report to RN 

## 2015-07-12 NOTE — Patient Instructions (Addendum)
No polyps!  Exam was normal though I had to use a small scope to complete the exam due to scar tissue from prior hysterectomy.  I appreciate the opportunity to care for you. Sheri Mayer, MD, [facility]   Discharge instructions given. Normal exam. Resume previous medications. YOU HAD AN ENDOSCOPIC PROCEDURE TODAY AT [facility]:   Refer to the procedure report that was given to you for any specific questions about what was found during the examination.  If the procedure report does not answer your questions, please call your gastroenterologist to clarify.  If you requested that your care partner not be given the details of your procedure findings, then the procedure report has been included in a sealed envelope for you to review at your convenience later.  YOU SHOULD EXPECT: Some feelings of bloating in the abdomen. Passage of more gas than usual.  Walking can help get rid of the air that was put into your GI tract during the procedure and reduce the bloating. If you had a lower endoscopy (such as a colonoscopy or flexible sigmoidoscopy) you may notice spotting of blood in your stool or on the toilet paper. If you underwent a bowel prep for your procedure, you may not have a normal bowel movement for a few days.  Please Note:  You might notice some irritation and congestion in your nose or some drainage.  This is from the oxygen used during your procedure.  There is no need for concern and it should clear up in a day or so.  SYMPTOMS TO REPORT IMMEDIATELY:   Following lower endoscopy (colonoscopy or flexible sigmoidoscopy):  Excessive amounts of blood in the stool  Significant tenderness or worsening of abdominal pains  Swelling of the abdomen that is new, acute  Fever of 100F or higher  For urgent or emergent issues, a gastroenterologist can be reached at any hour by calling [phone].   DIET: Your first meal following the procedure should be a small meal  and then it is ok to progress to your normal diet. Heavy or fried foods are harder to digest and may make you feel nauseous or bloated.  Likewise, meals heavy in dairy and vegetables can increase bloating.  Drink plenty of fluids but you should avoid alcoholic beverages for 24 hours.  ACTIVITY:  You should plan to take it easy for the rest of today and you should NOT DRIVE or use heavy machinery until tomorrow (because of the sedation medicines used during the test).    FOLLOW UP: Our staff will call the number listed on your records the next business day following your procedure to check on you and address any questions or concerns that you may have regarding the information given to you following your procedure. If we do not reach you, we will leave a message.  However, if you are feeling well and you are not experiencing any problems, there is no need to return our call.  We will assume that you have returned to your regular daily activities without incident.  If any biopsies were taken you will be contacted by phone or by letter within the next 1-3 weeks.  Please call us at [phone] if you have not heard about the biopsies in 3 weeks.    SIGNATURES/CONFIDENTIALITY: You and/or your care partner have signed paperwork which will be entered into your electronic medical record.  These signatures attest to the fact that that the information above on your After  Visit Summary has been reviewed and is understood.  Full responsibility of the confidentiality of this discharge information lies with you and/or your care-partner. 

## 2015-07-13 ENCOUNTER — Telehealth: Payer: Self-pay | Admitting: *Deleted

## 2015-07-13 NOTE — Telephone Encounter (Signed)
  Follow up Call-  Call back number 07/12/2015  Post procedure Call Back phone  # [phone] cell  Permission to leave phone message Yes     Patient questions:  Do you have a fever, pain , or abdominal swelling? No. Pain Score  0 *  Have you tolerated food without any problems? Yes.    Have you been able to return to your normal activities? Yes.    Do you have any questions about your discharge instructions: Diet   No. Medications  No. Follow up visit  No.  Do you have questions or concerns about your Care? No.  Actions: * If pain score is 4 or above: No action needed, pain <4.

## 2015-08-02 ENCOUNTER — Emergency Department
Admission: EM | Admit: 2015-08-02 | Discharge: 2015-08-02 | Disposition: A | Discharge status: Home / Self Care | Payer: Medicare HMO | Attending: Emergency Medicine | Admitting: Emergency Medicine

## 2015-08-02 DIAGNOSIS — F329 Major depressive disorder, single episode, unspecified: Secondary | ICD-10-CM | POA: Diagnosis not present

## 2015-08-02 DIAGNOSIS — Y999 Unspecified external cause status: Secondary | ICD-10-CM | POA: Insufficient documentation

## 2015-08-02 DIAGNOSIS — J449 Chronic obstructive pulmonary disease, unspecified: Secondary | ICD-10-CM | POA: Diagnosis not present

## 2015-08-02 DIAGNOSIS — F17210 Nicotine dependence, cigarettes, uncomplicated: Secondary | ICD-10-CM | POA: Diagnosis not present

## 2015-08-02 DIAGNOSIS — S3992XA Unspecified injury of lower back, initial encounter: Secondary | ICD-10-CM | POA: Diagnosis present

## 2015-08-02 DIAGNOSIS — J45909 Unspecified asthma, uncomplicated: Secondary | ICD-10-CM | POA: Diagnosis not present

## 2015-08-02 DIAGNOSIS — X58XXXA Exposure to other specified factors, initial encounter: Secondary | ICD-10-CM | POA: Diagnosis not present

## 2015-08-02 DIAGNOSIS — Y939 Activity, unspecified: Secondary | ICD-10-CM | POA: Insufficient documentation

## 2015-08-02 DIAGNOSIS — S39012A Strain of muscle, fascia and tendon of lower back, initial encounter: Secondary | ICD-10-CM | POA: Diagnosis not present

## 2015-08-02 DIAGNOSIS — Y929 Unspecified place or not applicable: Secondary | ICD-10-CM | POA: Insufficient documentation

## 2015-08-02 MED ORDER — TRAMADOL HCL 50 MG PO TABS: 50.0000 mg | ORAL_TABLET | Freq: Four times a day (QID) | ORAL | Status: DC | PRN

## 2015-08-02 MED ORDER — TRAMADOL HCL 50 MG PO TABS
50.0000 mg | ORAL_TABLET | Freq: Once | ORAL | Status: AC
  Administered 2015-08-02: 50 mg via ORAL
  Filled 2015-08-02: qty 1

## 2015-08-02 MED ORDER — CARISOPRODOL 350 MG PO TABS: 350.0000 mg | ORAL_TABLET | Freq: Four times a day (QID) | ORAL | Status: DC | PRN

## 2015-08-02 MED ORDER — METHOCARBAMOL 500 MG PO TABS
1000.0000 mg | ORAL_TABLET | Freq: Once | ORAL | Status: AC
  Administered 2015-08-02: 1000 mg via ORAL
  Filled 2015-08-02: qty 2

## 2015-08-02 NOTE — ED Provider Notes (Signed)
[facility] Emergency Department Provider Note  ____________________________________________  Time seen: Approximately 4:38 PM  I have reviewed the triage vital signs and the nursing notes.   HISTORY  Chief Complaint Back Pain    HPI Sheri Ayala is a 65 y.o. female patient complaining of bilateral back pain for 2 weeks. Patient state onset 2-3 days after colonoscopy. Patient describes the pain as "spasmatic". Patient state pain is increase with flexion and lateral movements. Patient denies any radicular component to this pain. Patient denies any bladder or bowel dysfunction. No palliative measures taken for this complaint. Patient is rating the pain as a 5/10.  Past Medical History  Diagnosis Date  . Headache(784.0)   . Pleurisy   . Bronchospasm   . Chronic lymphocytic leukemia ([place]) 2004  . Pneumonia     13 times  . Thrombocytopenia ([place])   . Olecranon bursitis of left elbow November 2012.    in past  . Tobacco abuse   . Frontal sinusitis November 2012  . COPD (chronic obstructive pulmonary disease) ([place])   . Asthma   . H/O viral encephalitis   . Allergy   . Anxiety   . Depression   . Emphysema of lung ([place])   . Heart murmur     Patient Active Problem List   Diagnosis Date Noted  . Acute on chronic respiratory failure with hypoxia ([place]) 01/30/2015  . Right lower lobe pneumonia 01/30/2015  . Generalized weakness 01/30/2015  . COPD exacerbation ([place]) 01/29/2015  . SOB (shortness of breath) 12/24/2012  . Dizziness 12/22/2012  . COPD with acute exacerbation ([place]) 12/22/2012  . Anemia 03/28/2011  . Cellulitis of both external ears 03/27/2011  . Headache in front of head 03/27/2011  . Thrombocytopenia ([place]) 03/27/2011  . Acute frontal sinusitis 03/10/2011  . Cellulitis of elbow 03/09/2011  . Bursitis of elbow 03/09/2011  . CLL (chronic lymphocytic leukemia) ([place]) 03/09/2011  . Tobacco abuse 03/09/2011    Past Surgical History  Procedure  Laterality Date  . Abdominal hysterectomy    . Colonoscopy    . Upper gastrointestinal endoscopy      Current Outpatient Rx  Name  Route  Sig  Dispense  Refill  . albuterol (PROVENTIL) (2.5 MG/3ML) 0.083% nebulizer solution   Nebulization   Take 3 mLs (2.5 mg total) by nebulization every 4 (four) hours as needed for wheezing or shortness of breath.   75 mL   0   . ALPRAZolam (XANAX) 0.5 MG tablet   Oral   Take 0.5 mg by mouth 2 (two) times daily as needed for anxiety or sleep.          . Aspirin-Acetaminophen-Caffeine (GOODY HEADACHE PO)   Oral   Take 1 packet by mouth 2 (two) times daily as needed (for pain).          . budesonide-formoterol (SYMBICORT) 160-4.5 MCG/ACT inhaler   Inhalation   Inhale 2 puffs into the lungs 2 (two) times daily. Patient not taking: Reported on 07/12/2015   1 Inhaler   12   . carisoprodol (SOMA) 350 MG tablet   Oral   Take 1 tablet (350 mg total) by mouth 4 (four) times daily as needed for muscle spasms.   30 tablet   0   . mirtazapine (REMERON) 15 MG tablet               . traMADol (ULTRAM) 50 MG tablet   Oral   Take 1 tablet (50 mg total) by  mouth every 6 (six) hours as needed.   20 tablet   0     Allergies Citrus; Doxycycline; and Penicillins  Family History  Problem Relation Age of Onset  . Aneurysm Mother   . Liver disease Father   . Congestive Heart Failure Brother   . Heart disease Brother   . Colon cancer Neg Hx   . Esophageal cancer Neg Hx   . Pancreatic cancer Neg Hx   . Rectal cancer Neg Hx   . Stomach cancer Neg Hx     Social History Social History  Substance Use Topics  . Smoking status: Current Every Day Smoker -- 0.50 packs/day for 45 years    Types: Cigarettes, E-cigarettes  . Smokeless tobacco: Never Used  . Alcohol Use: No    Review of Systems Constitutional: No fever/chills Eyes: No visual changes. ENT: No sore throat. Cardiovascular: Denies chest pain. Respiratory: Denies shortness of  breath. Gastrointestinal: No abdominal pain.  No nausea, no vomiting.  No diarrhea.  No constipation. Genitourinary: Negative for dysuria. Musculoskeletal: Bilateral back pain Skin: Negative for rash. Neurological: Negative for headaches, focal weakness or numbness. Psychiatric:Anxiety depression Hematological/Lymphatic:CLL Allergic/Immunilogical: See medication list   ____________________________________________   PHYSICAL EXAM:  VITAL SIGNS: ED Triage Vitals  Enc Vitals Group     BP 08/02/15 1629 137/78 mmHg     Pulse Rate 08/02/15 1629 80     Resp 08/02/15 1629 18     Temp 08/02/15 1629 98.2 F (36.8 C)     Temp Source 08/02/15 1629 Oral     SpO2 08/02/15 1629 94 %     Weight 08/02/15 1629 130 lb (58.968 kg)     Height 08/02/15 1629 '5\' 10"'$  (1.778 m)     Head Cir --      Peak Flow --      Pain Score --      Pain Loc --      Pain Edu? --      Excl. in [place]? --     Constitutional: Alert and oriented. Well appearing and in no acute distress. Eyes: Conjunctivae are normal. PERRL. EOMI. Head: Atraumatic. Nose: No congestion/rhinnorhea. Mouth/Throat: Mucous membranes are moist.  Oropharynx non-erythematous. Neck: No stridor. No cervical spine tenderness to palpation. Hematological/Lymphatic/Immunilogical: No cervical lymphadenopathy. Cardiovascular: Normal rate, regular rhythm. Grossly normal heart sounds.  Good peripheral circulation. Respiratory: Normal respiratory effort.  No retractions. Lungs CTAB. Gastrointestinal: Soft and nontender. No distention. No abdominal bruits. No CVA tenderness. Musculoskeletal: No obvious spinal deformity. No guarding palpation spinal processes. Patient has bilateral paraspinal muscle spasm with flexion and extension. Patient is a negative straight leg test. Patient has normal gait.  Neurologic:  Normal speech and language. No gross focal neurologic deficits are appreciated. No gait instability. Skin:  Skin is warm, dry and intact. No rash  noted. Psychiatric: Mood and affect are normal. Speech and behavior are normal.  ____________________________________________   LABS (all labs ordered are listed, but only abnormal results are displayed)  Labs Reviewed - No data to display ____________________________________________  EKG   ____________________________________________  RADIOLOGY   ____________________________________________   PROCEDURES  Procedure(s) performed: None  Critical Care performed: No  ____________________________________________   INITIAL IMPRESSION / ASSESSMENT AND PLAN / ED COURSE  Pertinent labs & imaging results that were available during my care of the patient were reviewed by me and considered in my medical decision making (see chart for details).  Paralumbar muscle strain. Patient given discharge Instructions. Patient given prescription for Robaxin and  tramadol. Patient advised follow-up family doctor is no improvement in 3-5 days. Return by ER for condition worsens. ____________________________________________   FINAL CLINICAL IMPRESSION(S) / ED DIAGNOSES  Final diagnoses:  Strain of lumbar paraspinal muscle, initial encounter      Sable Feil, PA-C 08/02/15 1657  Harvest Dark, MD 08/02/15 2317

## 2015-08-02 NOTE — ED Notes (Signed)
Pt here with reports of back pain - pt states  "My back has hurt for two weeks - ever since I had a colonoscopy."

## 2015-08-28 DIAGNOSIS — I639 Cerebral infarction, unspecified: Secondary | ICD-10-CM

## 2015-08-28 HISTORY — DX: Cerebral infarction, unspecified: I63.9

## 2015-08-31 ENCOUNTER — Telehealth: Payer: Self-pay | Admitting: Oncology

## 2015-08-31 NOTE — Telephone Encounter (Signed)
pt cld to r/s appt-cld & spoke to pt & gave pt r/s time & date of appt

## 2015-09-01 ENCOUNTER — Ambulatory Visit: Payer: Medicare HMO | Admitting: Oncology

## 2015-09-01 ENCOUNTER — Other Ambulatory Visit: Payer: Medicare HMO

## 2015-09-24 ENCOUNTER — Encounter: Payer: Self-pay | Admitting: Emergency Medicine

## 2015-09-24 ENCOUNTER — Emergency Department
Admission: EM | Admit: 2015-09-24 | Discharge: 2015-09-24 | Disposition: A | Discharge status: Home / Self Care | Payer: Medicare HMO | Attending: Emergency Medicine | Admitting: Emergency Medicine

## 2015-09-24 ENCOUNTER — Emergency Department: Payer: Medicare HMO

## 2015-09-24 DIAGNOSIS — Z7982 Long term (current) use of aspirin: Secondary | ICD-10-CM | POA: Insufficient documentation

## 2015-09-24 DIAGNOSIS — J45909 Unspecified asthma, uncomplicated: Secondary | ICD-10-CM | POA: Diagnosis not present

## 2015-09-24 DIAGNOSIS — Z7951 Long term (current) use of inhaled steroids: Secondary | ICD-10-CM | POA: Insufficient documentation

## 2015-09-24 DIAGNOSIS — J441 Chronic obstructive pulmonary disease with (acute) exacerbation: Secondary | ICD-10-CM | POA: Diagnosis not present

## 2015-09-24 DIAGNOSIS — F17290 Nicotine dependence, other tobacco product, uncomplicated: Secondary | ICD-10-CM | POA: Insufficient documentation

## 2015-09-24 DIAGNOSIS — F329 Major depressive disorder, single episode, unspecified: Secondary | ICD-10-CM | POA: Insufficient documentation

## 2015-09-24 DIAGNOSIS — Z79899 Other long term (current) drug therapy: Secondary | ICD-10-CM | POA: Insufficient documentation

## 2015-09-24 DIAGNOSIS — R42 Dizziness and giddiness: Secondary | ICD-10-CM | POA: Diagnosis present

## 2015-09-24 LAB — URINALYSIS COMPLETE WITH MICROSCOPIC (ARMC ONLY)
Bilirubin Urine: NEGATIVE
Glucose, UA: NEGATIVE mg/dL
KETONES UR: NEGATIVE mg/dL
Leukocytes, UA: NEGATIVE
Nitrite: NEGATIVE
PH: 5 (ref 5.0–8.0)
PROTEIN: NEGATIVE mg/dL
Specific Gravity, Urine: 1.01 (ref 1.005–1.030)

## 2015-09-24 LAB — BASIC METABOLIC PANEL
ANION GAP: 8 (ref 5–15)
BUN: 20 mg/dL (ref 6–20)
CHLORIDE: 109 mmol/L (ref 101–111)
CO2: 25 mmol/L (ref 22–32)
Calcium: 8.3 mg/dL — ABNORMAL LOW (ref 8.9–10.3)
Creatinine, Ser: 0.66 mg/dL (ref 0.44–1.00)
GFR calc Af Amer: >60 mL/min (ref >60)
GFR calc non Af Amer: >60 mL/min (ref >60)
Glucose, Bld: 75 mg/dL (ref 65–99)
Potassium: 4.4 mmol/L (ref 3.5–5.1)
Sodium: 142 mmol/L (ref 135–145)

## 2015-09-24 LAB — CBC
HCT: 41.5 % (ref 35.0–47.0)
HEMOGLOBIN: 13.5 g/dL (ref 12.0–16.0)
MCH: 30.2 pg (ref 26.0–34.0)
MCHC: 32.5 g/dL (ref 32.0–36.0)
MCV: 92.9 fL (ref 80.0–100.0)
Platelets: 99 10*3/uL — ABNORMAL LOW (ref 150–440)
RBC: 4.47 MIL/uL (ref 3.80–5.20)
RDW: 16.1 % — ABNORMAL HIGH (ref 11.5–14.5)
WBC: 34.6 10*3/uL — ABNORMAL HIGH (ref 3.6–11.0)

## 2015-09-24 LAB — TROPONIN I: Troponin I: <0.03 ng/mL (ref <0.031)

## 2015-09-24 MED ORDER — IPRATROPIUM-ALBUTEROL 0.5-2.5 (3) MG/3ML IN SOLN
3.0000 mL | Freq: Once | RESPIRATORY_TRACT | Status: AC
  Administered 2015-09-24: 3 mL via RESPIRATORY_TRACT
  Filled 2015-09-24: qty 3

## 2015-09-24 MED ORDER — SODIUM CHLORIDE 0.9 % IV SOLN
1000.0000 mL | Freq: Once | INTRAVENOUS | Status: AC
  Administered 2015-09-24: 1000 mL via INTRAVENOUS

## 2015-09-24 NOTE — ED Notes (Signed)
Patient from home via [facility] with complaint of weakness, shortness of breath, dizziness, nausea,feverish . Patient recently removed 5 ticks from her body.

## 2015-09-24 NOTE — ED Provider Notes (Signed)
[facility] Emergency Department Provider Note  ____________________________________________    I have reviewed the triage vital signs and the nursing notes.   HISTORY  Chief Complaint Weakness and Shortness of Breath    HPI Sheri Ayala is a 65 y.o. female with a history of chronic lymphocytic leukemia and COPD who presents with complaints of feeling weak and dizzy. She reports a subjective fever today. One point she felt nauseous but reports that has improved. She is concerned because she had 5 ticks on her over the last couple of days that she removed. No rash noted. No chest pain, she reports her breathing is essentially at baseline     Past Medical History  Diagnosis Date  . Headache(784.0)   . Pleurisy   . Bronchospasm   . Chronic lymphocytic leukemia ([place]) 2004  . Pneumonia     13 times  . Thrombocytopenia ([place])   . Olecranon bursitis of left elbow November 2012.    in past  . Tobacco abuse   . Frontal sinusitis November 2012  . COPD (chronic obstructive pulmonary disease) ([place])   . Asthma   . H/O viral encephalitis   . Allergy   . Anxiety   . Depression   . Emphysema of lung ([place])   . Heart murmur     Patient Active Problem List   Diagnosis Date Noted  . Acute on chronic respiratory failure with hypoxia ([place]) 01/30/2015  . Right lower lobe pneumonia 01/30/2015  . Generalized weakness 01/30/2015  . COPD exacerbation ([place]) 01/29/2015  . SOB (shortness of breath) 12/24/2012  . Dizziness 12/22/2012  . COPD with acute exacerbation ([place]) 12/22/2012  . Anemia 03/28/2011  . Cellulitis of both external ears 03/27/2011  . Headache in front of head 03/27/2011  . Thrombocytopenia ([place]) 03/27/2011  . Acute frontal sinusitis 03/10/2011  . Cellulitis of elbow 03/09/2011  . Bursitis of elbow 03/09/2011  . CLL (chronic lymphocytic leukemia) ([place]) 03/09/2011  . Tobacco abuse 03/09/2011    Past Surgical History  Procedure Laterality Date  .  Abdominal hysterectomy    . Colonoscopy    . Upper gastrointestinal endoscopy      Current Outpatient Rx  Name  Route  Sig  Dispense  Refill  . albuterol (PROVENTIL) (2.5 MG/3ML) 0.083% nebulizer solution   Nebulization   Take 3 mLs (2.5 mg total) by nebulization every 4 (four) hours as needed for wheezing or shortness of breath.   75 mL   0   . ALPRAZolam (XANAX) 0.5 MG tablet   Oral   Take 0.5 mg by mouth 2 (two) times daily as needed for anxiety or sleep.          . Aspirin-Acetaminophen-Caffeine (GOODY HEADACHE PO)   Oral   Take 1 packet by mouth 2 (two) times daily as needed (for pain).          . budesonide-formoterol (SYMBICORT) 160-4.5 MCG/ACT inhaler   Inhalation   Inhale 2 puffs into the lungs 2 (two) times daily. Patient not taking: Reported on 07/12/2015   1 Inhaler   12   . carisoprodol (SOMA) 350 MG tablet   Oral   Take 1 tablet (350 mg total) by mouth 4 (four) times daily as needed for muscle spasms.   30 tablet   0   . mirtazapine (REMERON) 15 MG tablet               . traMADol (ULTRAM) 50 MG tablet   Oral  Take 1 tablet (50 mg total) by mouth every 6 (six) hours as needed.   20 tablet   0     Allergies Citrus; Doxycycline; and Penicillins  Family History  Problem Relation Age of Onset  . Aneurysm Mother   . Liver disease Father   . Congestive Heart Failure Brother   . Heart disease Brother   . Colon cancer Neg Hx   . Esophageal cancer Neg Hx   . Pancreatic cancer Neg Hx   . Rectal cancer Neg Hx   . Stomach cancer Neg Hx     Social History Social History  Substance Use Topics  . Smoking status: Current Every Day Smoker -- 0.50 packs/day for 45 years    Types: Cigarettes, E-cigarettes  . Smokeless tobacco: Never Used  . Alcohol Use: No    Review of Systems  Constitutional: Subjective fever. Eyes: Negative for redness ENT: Negative for sore throat Cardiovascular: Negative for chest pain  Respiratory: Negative for  shortness of breath. Gastrointestinal: Negative for abdominal pain Genitourinary: Negative for dysuria. Musculoskeletal: Negative for back pain. Skin: Negative for rash. Neurological: Negative for focal weakness, Positive for dizziness Psychiatric: no anxiety    ____________________________________________   PHYSICAL EXAM:  VITAL SIGNS: ED Triage Vitals  Enc Vitals Group     BP 09/24/15 1824 109/68 mmHg     Pulse Rate 09/24/15 1824 95     Resp 09/24/15 1824 24     Temp 09/24/15 1824 98.6 F (37 C)     Temp Source 09/24/15 1824 Oral     SpO2 09/24/15 1824 91 %     Weight 09/24/15 1824 133 lb (60.328 kg)     Height 09/24/15 1824 '5\' 10"'$  (1.778 m)     Head Cir --      Peak Flow --      Pain Score --      Pain Loc --      Pain Edu? --      Excl. in [place]? --      Constitutional: Alert and oriented. Well appearing and in no distress.  Eyes: Conjunctivae are normal. No erythema or injection ENT   Head: Normocephalic and atraumatic.   Mouth/Throat: Mucous membranes are moist. Cardiovascular: Normal rate, regular rhythm. Normal and symmetric distal pulses are present in the upper extremities.  Respiratory: Normal respiratory effort without tachypnea nor retractions. Scattered wheezes throughout, mild. Gastrointestinal: Soft and non-tender in all quadrants. No distention. There is no CVA tenderness. Genitourinary: deferred Musculoskeletal: Nontender with normal range of motion in all extremities. No lower extremity tenderness nor edema. Neurologic:  Normal speech and language. No gross focal neurologic deficits are appreciated. Skin:  Skin is warm, dry and intact. No rash noted. Psychiatric: Mood and affect are normal. Patient exhibits appropriate insight and judgment.  ____________________________________________    LABS (pertinent positives/negatives)  Labs Reviewed  BASIC METABOLIC PANEL - Abnormal; Notable for the following:    Calcium 8.3 (*)    All other  components within normal limits  CBC - Abnormal; Notable for the following:    WBC 34.6 (*)    RDW 16.1 (*)    Platelets 99 (*)    All other components within normal limits  URINALYSIS COMPLETEWITH MICROSCOPIC ([facility] ONLY)  TROPONIN I  CBG MONITORING, ED    ____________________________________________   EKG  ED ECG REPORT I, Lavonia Drafts, the attending physician, personally viewed and interpreted this ECG.  Date: 09/24/2015 EKG Time: 6:22 PM Rate: 90 Rhythm: normal sinus rhythm  QRS Axis: normal Intervals: normal ST/T Wave abnormalities: normal Conduction Disturbances: none Narrative Interpretation: unremarkable   ____________________________________________    RADIOLOGY  Chest x-ray unremarkable  ____________________________________________   PROCEDURES  Procedure(s) performed: none  Critical Care performed: none  ____________________________________________   INITIAL IMPRESSION / ASSESSMENT AND PLAN / ED COURSE  Pertinent labs & imaging results that were available during my care of the patient were reviewed by me and considered in my medical decision making (see chart for details).  Patient presents with several complaints, including dizziness, generalized weakness which she feels may be related to recent tick bites. She denies rash. No headache. No neck pain. She is overall very well-appearing and in no distress. We will check labs, chest x-ray, give IV fluids, give DuoNeb and reevaluate  ----------------------------------------- 9:01 PM on 09/24/2015 -----------------------------------------  Patient feels remarkably better after fluids. She noted has dizziness. She has follow-up with her oncologist on Monday and her primary care physician on Tuesday. She feels comfortable going home. Her lab work is overall reassuring, she has a chronically elevated white blood cell count due to her CLL. Return precautions  discussed.  ____________________________________________   FINAL CLINICAL IMPRESSION(S) / ED DIAGNOSES  Final diagnoses:  Dizziness          Lavonia Drafts, MD 09/24/15 2101

## 2015-09-24 NOTE — Discharge Instructions (Signed)

## 2015-09-26 ENCOUNTER — Other Ambulatory Visit (HOSPITAL_BASED_OUTPATIENT_CLINIC_OR_DEPARTMENT_OTHER): Payer: Medicare HMO

## 2015-09-26 ENCOUNTER — Ambulatory Visit (HOSPITAL_BASED_OUTPATIENT_CLINIC_OR_DEPARTMENT_OTHER): Payer: Medicare HMO | Admitting: Oncology

## 2015-09-26 ENCOUNTER — Telehealth: Payer: Self-pay | Admitting: Oncology

## 2015-09-26 VITALS — BP 136/58 | HR 86 | Temp 98.4°F | Resp 20 | Ht 70.0 in | Wt 139.7 lb

## 2015-09-26 DIAGNOSIS — Z720 Tobacco use: Secondary | ICD-10-CM | POA: Diagnosis not present

## 2015-09-26 DIAGNOSIS — R51 Headache: Secondary | ICD-10-CM

## 2015-09-26 DIAGNOSIS — C9110 Chronic lymphocytic leukemia of B-cell type not having achieved remission: Secondary | ICD-10-CM

## 2015-09-26 DIAGNOSIS — Z23 Encounter for immunization: Secondary | ICD-10-CM

## 2015-09-26 DIAGNOSIS — D6959 Other secondary thrombocytopenia: Secondary | ICD-10-CM | POA: Diagnosis not present

## 2015-09-26 LAB — CBC WITH DIFFERENTIAL/PLATELET
BASO%: 0.1 % (ref 0.0–2.0)
BASOS ABS: 0 10*3/uL (ref 0.0–0.1)
EOS%: 0.3 % (ref 0.0–7.0)
Eosinophils Absolute: 0.1 10*3/uL (ref 0.0–0.5)
HCT: 41.7 % (ref 34.8–46.6)
HGB: 13.2 g/dL (ref 11.6–15.9)
LYMPH%: 78.1 % — ABNORMAL HIGH (ref 14.0–49.7)
MCH: 29.6 pg (ref 25.1–34.0)
MCHC: 31.6 g/dL (ref 31.5–36.0)
MCV: 93.6 fL (ref 79.5–101.0)
MONO#: 0.3 10*3/uL (ref 0.1–0.9)
MONO%: 1.3 % (ref 0.0–14.0)
NEUT#: 5.3 10*3/uL (ref 1.5–6.5)
NEUT%: 20.2 % — AB (ref 38.4–76.8)
Platelets: 96 10*3/uL — ABNORMAL LOW (ref 145–400)
RBC: 4.45 10*6/uL (ref 3.70–5.45)
RDW: 16.5 % — ABNORMAL HIGH (ref 11.2–14.5)
WBC: 26.4 10*3/uL — ABNORMAL HIGH (ref 3.9–10.3)
lymph#: 20.6 10*3/uL — ABNORMAL HIGH (ref 0.9–3.3)

## 2015-09-26 NOTE — Progress Notes (Signed)
  [place] OFFICE PROGRESS NOTE   Diagnosis: CLL   INTERVAL HISTORY:   Sheri Ayala returns as scheduled. She complains of a right frontal/parietal headache for the past week. She has noted blurring of vision and feeling "drunk ". No focal weakness. No fever, night sweats, or anorexia. No palpable lymph nodes. She continues smoking.  She was evaluated in the emergency room on 09/24/2015. A chest x-ray was unremarkable.  Objective:  Vital signs in last 24 hours:  Blood pressure 136/58, pulse 86, temperature 98.4 F (36.9 C), temperature source Oral, resp. rate 20, height '5\' 10"'$  (1.778 m), weight 139 lb 11.2 oz (63.368 kg), SpO2 93 %.    HEENT: Neck without mass, no frontal sinus tenderness. Lymphatics: No cervical, supra-clavicular, axillary, or inguinal nodes Resp: Mild bilateral inspiratory wheeze, no respiratory distress Cardio: Regular rate and rhythm with premature beats GI: No hepatosplenomegaly Vascular: No leg edema   Lab Results:  Lab Results  Component Value Date   WBC 26.4* 09/26/2015   HGB 13.2 09/26/2015   HCT 41.7 09/26/2015   MCV 93.6 09/26/2015   PLT 96* 09/26/2015   NEUTROABS 5.3 09/26/2015     Imaging:  Dg Chest Portable 1 View  09/24/2015  CLINICAL DATA:  Shortness of Breath EXAM: PORTABLE CHEST 1 VIEW COMPARISON:  03/30/2015 FINDINGS: Cardiomediastinal silhouette is unremarkable. No acute infiltrate or pleural effusion. No pulmonary edema. Mild mid thoracic dextroscoliosis. IMPRESSION: No active disease. Electronically Signed   By: Lahoma Crocker M.D.   On: 09/24/2015 19:21    Medications: I have reviewed the patient's current medications.  Assessment/Plan:  1. CLL 2. 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 Ongoing tobacco use 4. History of recurrent pneumonia   Disposition:  Sheri Ayala is stable from a hematologic standpoint. There is no indication for treating the CLL. She will return for an office visit and CBC in 8  months.  She is scheduled to see Dr. Aris Lot this week to evaluate the headache.  Betsy Coder, MD  09/26/2015  10:16 AM

## 2015-09-26 NOTE — Telephone Encounter (Signed)
cld pt and left message of time  date of appt 2/12'@10'$  per pof

## 2015-09-27 LAB — IGG, IGA, IGM
IgA, Qn, Serum: 25 mg/dL — ABNORMAL LOW (ref 87–352)
IgG, Qn, Serum: 254 mg/dL — ABNORMAL LOW (ref 700–1600)
IgM, Qn, Serum: 11 mg/dL — ABNORMAL LOW (ref 26–217)

## 2015-09-28 ENCOUNTER — Other Ambulatory Visit: Payer: Self-pay | Admitting: Adult Health

## 2015-09-28 ENCOUNTER — Other Ambulatory Visit (HOSPITAL_COMMUNITY): Payer: Self-pay | Admitting: Adult Health

## 2015-09-28 DIAGNOSIS — R51 Headache: Principal | ICD-10-CM

## 2015-09-28 DIAGNOSIS — R519 Headache, unspecified: Secondary | ICD-10-CM

## 2015-09-28 DIAGNOSIS — I499 Cardiac arrhythmia, unspecified: Secondary | ICD-10-CM

## 2015-09-29 ENCOUNTER — Ambulatory Visit (HOSPITAL_COMMUNITY)
Admission: RE | Admit: 2015-09-29 | Discharge: 2015-09-29 | Disposition: A | Discharge status: Home / Self Care | Payer: Medicare HMO | Source: Ambulatory Visit | Attending: Adult Health | Admitting: Adult Health

## 2015-09-29 ENCOUNTER — Other Ambulatory Visit: Payer: Self-pay

## 2015-09-29 ENCOUNTER — Ambulatory Visit
Admission: RE | Admit: 2015-09-29 | Discharge: 2015-09-29 | Disposition: A | Discharge status: Home / Self Care | Payer: Medicare HMO | Source: Ambulatory Visit | Attending: Adult Health | Admitting: Adult Health

## 2015-09-29 DIAGNOSIS — R51 Headache: Principal | ICD-10-CM

## 2015-09-29 DIAGNOSIS — I498 Other specified cardiac arrhythmias: Secondary | ICD-10-CM | POA: Diagnosis not present

## 2015-09-29 DIAGNOSIS — I499 Cardiac arrhythmia, unspecified: Secondary | ICD-10-CM | POA: Diagnosis present

## 2015-09-29 DIAGNOSIS — R519 Headache, unspecified: Secondary | ICD-10-CM

## 2015-09-29 MED ORDER — IOPAMIDOL (ISOVUE-300) INJECTION 61%
75.0000 mL | Freq: Once | INTRAVENOUS | Status: AC | PRN
  Administered 2015-09-29: 75 mL via INTRAVENOUS

## 2015-10-11 ENCOUNTER — Ambulatory Visit: Payer: MEDICAID | Admitting: Diagnostic Neuroimaging

## 2015-10-21 ENCOUNTER — Ambulatory Visit: Payer: MEDICAID | Admitting: Diagnostic Neuroimaging

## 2015-11-14 ENCOUNTER — Ambulatory Visit: Payer: MEDICAID | Admitting: Diagnostic Neuroimaging

## 2015-11-15 ENCOUNTER — Encounter: Payer: Self-pay | Admitting: Diagnostic Neuroimaging

## 2015-11-24 DIAGNOSIS — I340 Nonrheumatic mitral (valve) insufficiency: Secondary | ICD-10-CM

## 2015-11-24 DIAGNOSIS — I071 Rheumatic tricuspid insufficiency: Secondary | ICD-10-CM

## 2015-11-24 DIAGNOSIS — I341 Nonrheumatic mitral (valve) prolapse: Secondary | ICD-10-CM

## 2015-11-24 HISTORY — DX: Nonrheumatic mitral (valve) prolapse: I34.1

## 2015-11-24 HISTORY — DX: Rheumatic tricuspid insufficiency: I07.1

## 2015-11-24 HISTORY — DX: Nonrheumatic mitral (valve) insufficiency: I34.0

## 2016-03-21 ENCOUNTER — Encounter: Payer: Self-pay | Admitting: *Deleted

## 2016-03-21 NOTE — Progress Notes (Signed)
Received faxed CXR results from Alvester Chou ANP.  On impression stated "CT chest recommended to further assess."  Called Back to [facility] and message left requesting return phone call to be sure that they are following up with ordering CT Chest for patient per order of Dr. Benay Spice.

## 2016-03-23 ENCOUNTER — Other Ambulatory Visit: Payer: Self-pay | Admitting: Adult Health

## 2016-03-23 DIAGNOSIS — R918 Other nonspecific abnormal finding of lung field: Secondary | ICD-10-CM

## 2016-03-27 ENCOUNTER — Ambulatory Visit
Admission: RE | Admit: 2016-03-27 | Discharge: 2016-03-27 | Disposition: A | Discharge status: Home / Self Care | Payer: Medicare HMO | Source: Ambulatory Visit | Attending: Adult Health | Admitting: Adult Health

## 2016-03-27 DIAGNOSIS — R918 Other nonspecific abnormal finding of lung field: Secondary | ICD-10-CM

## 2016-04-03 ENCOUNTER — Other Ambulatory Visit: Payer: Self-pay | Admitting: *Deleted

## 2016-04-03 ENCOUNTER — Telehealth: Payer: Self-pay | Admitting: *Deleted

## 2016-04-03 NOTE — Telephone Encounter (Signed)
Call received from Alvester Chou NP stating that she has not told patient about new lung mass d/t patient canceled her appt to discuss CT results.  Almyra Free states that patient does have an appt soon with her, but she was unaware of the date and that she would call [place] back once she has spoken with patient so PET scan can be scheduled.

## 2016-04-03 NOTE — Telephone Encounter (Signed)
Message left for Alvester Chou NP to call [facility] back regarding patient status.

## 2016-04-05 ENCOUNTER — Institutional Professional Consult (permissible substitution): Payer: Medicare HMO | Admitting: Internal Medicine

## 2016-04-10 ENCOUNTER — Ambulatory Visit (INDEPENDENT_AMBULATORY_CARE_PROVIDER_SITE_OTHER): Payer: Medicare HMO | Admitting: Internal Medicine

## 2016-04-10 ENCOUNTER — Encounter: Payer: Self-pay | Admitting: Internal Medicine

## 2016-04-10 DIAGNOSIS — J449 Chronic obstructive pulmonary disease, unspecified: Secondary | ICD-10-CM | POA: Diagnosis not present

## 2016-04-10 DIAGNOSIS — F17210 Nicotine dependence, cigarettes, uncomplicated: Secondary | ICD-10-CM

## 2016-04-10 DIAGNOSIS — R918 Other nonspecific abnormal finding of lung field: Secondary | ICD-10-CM

## 2016-04-10 MED ORDER — PREDNISONE 10 MG PO TABS: ORAL_TABLET | ORAL | 0 refills | Status: DC
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
## 2016-04-10 NOTE — Assessment & Plan Note (Signed)
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 based on ct and exam and doubt she's a candidate for LLobectomy but first needs a tissue dx  Discussed in detail all the  indications, usual  risks and alternatives  relative to the benefits with patient who agrees to proceed with bronchoscopy with biopsy tentively scheduled for 04/18/16  Total time devoted to counseling  > 50 % of 60 min new patient office visit:  review case with pt/daughter discussion of options/alternatives/ personally creating written customized instructions  in presence of pt  then going over those specific  Instructions directly with the pt including how to use all of the meds but in particular covering each new medication in detail and the difference between the maintenance/automatic meds and the prns using an action plan format for the latter.  Please see AVS from this visit for a full list of these instructions

## 2016-04-10 NOTE — Assessment & Plan Note (Signed)
Appears to moderately severe with borderline resting hypoxemia and clearly poor candidate at this point for surgery but should be able to undergo fob with bx in a week p first rx with pred   - The proper method of use, as well as anticipated side effects, of a metered-dose inhaler are discussed and demonstrated to the patient. Improved effectiveness after extensive coaching during this visit to a level of approximately 25 % from a baseline of 50 % > needs to work on this but for now no change in rx for copd/AB component with laba/ics = symb 160 2bid  - smoking cessation discussed separately

## 2016-04-10 NOTE — Assessment & Plan Note (Signed)

## 2016-04-10 NOTE — Patient Instructions (Signed)
Work on inhaler technique:  relax and gently blow all the way out then take a nice smooth deep breath back in, triggering the inhaler at same time you start breathing in.  Hold for up to 5 seconds if you can. Blow out thru nose. Rinse and gargle with water when done   Come to outpatient registration at [facility] (behind the ER) at 715 am Wednesday Jan 3  with nothing to eat or drink after midnight Tuesday for Bronchoscopy   Prednisone 10 mg take  4 each am x 2 days,   2 each am x 2 days,  1 each am x 2 days and stop   Plan A = Automatic =  Symbicort 160 Take 2 puffs first thing in am and then another 2 puffs about 12 hours later.    Plan B = Backup Only use your albuterol(ventolin)  as a rescue medication to be used if you can't catch your breath by resting or doing a relaxed purse lip breathing pattern.  - The less you use it, the better it will work when you need it. - Ok to use the inhaler up to 2 puffs  every 4 hours if you must but call for appointment if use goes up over your usual need - Don't leave home without it !!  (think of it like the spare tire for your car)   Plan C = Crisis - only use your albuterol nebulizer if you first try Plan B and it fails to help > ok to use the nebulizer up to every 4 hours but if start needing it regularly call for immediate appointment

## 2016-04-11 ENCOUNTER — Encounter: Payer: Self-pay | Admitting: *Deleted

## 2016-04-11 DIAGNOSIS — R918 Other nonspecific abnormal finding of lung field: Secondary | ICD-10-CM

## 2016-04-11 NOTE — Progress Notes (Signed)
Oncology Nurse Navigator Documentation  Oncology Nurse Navigator Flowsheets 04/11/2016  Navigator Location [facility]  Navigator Encounter Type Other/per Dr. Benay Spice he would like patient to get biopsy and have PET scan.  PET is ordered.  I will contact per cert coordinator to check on authorization then schedule.   Treatment Phase Pre-Tx/Tx Discussion  Barriers/Navigation Needs Coordination of Care  Interventions Coordination of Care  Coordination of Care Other  Acuity Level 2  Acuity Level 2 Other  Time Spent with Patient 30

## 2016-04-16 DIAGNOSIS — I4891 Unspecified atrial fibrillation: Secondary | ICD-10-CM

## 2016-04-16 HISTORY — DX: Unspecified atrial fibrillation: I48.91

## 2016-04-17 ENCOUNTER — Other Ambulatory Visit: Payer: Self-pay | Admitting: Oncology
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
## 2016-04-18 ENCOUNTER — Observation Stay (HOSPITAL_COMMUNITY): Payer: Medicare HMO

## 2016-04-18 ENCOUNTER — Observation Stay (HOSPITAL_COMMUNITY)
Admission: RE | Admit: 2016-04-18 | Discharge: 2016-04-19 | Disposition: A | Discharge status: Home / Self Care | Payer: Medicare HMO | Source: Ambulatory Visit | Attending: Pulmonary Disease | Admitting: Pulmonary Disease

## 2016-04-18 ENCOUNTER — Encounter (HOSPITAL_COMMUNITY): Payer: Self-pay | Admitting: Respiratory Therapy

## 2016-04-18 ENCOUNTER — Encounter (HOSPITAL_COMMUNITY)
Admission: RE | Disposition: A | Discharge status: Home / Self Care | Payer: Self-pay | Source: Ambulatory Visit | Attending: Pulmonary Disease

## 2016-04-18 ENCOUNTER — Ambulatory Visit (HOSPITAL_COMMUNITY)
Admission: RE | Admit: 2016-04-18 | Discharge: 2016-04-18 | Disposition: A | Discharge status: Home / Self Care | Payer: Medicare HMO | Source: Ambulatory Visit | Attending: Internal Medicine | Admitting: Internal Medicine

## 2016-04-18 DIAGNOSIS — R918 Other nonspecific abnormal finding of lung field: Secondary | ICD-10-CM | POA: Diagnosis not present

## 2016-04-18 DIAGNOSIS — J441 Chronic obstructive pulmonary disease with (acute) exacerbation: Principal | ICD-10-CM | POA: Insufficient documentation

## 2016-04-18 DIAGNOSIS — I340 Nonrheumatic mitral (valve) insufficiency: Secondary | ICD-10-CM | POA: Diagnosis not present

## 2016-04-18 DIAGNOSIS — J449 Chronic obstructive pulmonary disease, unspecified: Secondary | ICD-10-CM | POA: Diagnosis not present

## 2016-04-18 DIAGNOSIS — Z91018 Allergy to other foods: Secondary | ICD-10-CM | POA: Insufficient documentation

## 2016-04-18 DIAGNOSIS — J9621 Acute and chronic respiratory failure with hypoxia: Secondary | ICD-10-CM

## 2016-04-18 DIAGNOSIS — Z8673 Personal history of transient ischemic attack (TIA), and cerebral infarction without residual deficits: Secondary | ICD-10-CM | POA: Insufficient documentation

## 2016-04-18 DIAGNOSIS — F329 Major depressive disorder, single episode, unspecified: Secondary | ICD-10-CM | POA: Diagnosis not present

## 2016-04-18 DIAGNOSIS — F17210 Nicotine dependence, cigarettes, uncomplicated: Secondary | ICD-10-CM | POA: Insufficient documentation

## 2016-04-18 DIAGNOSIS — F419 Anxiety disorder, unspecified: Secondary | ICD-10-CM | POA: Insufficient documentation

## 2016-04-18 DIAGNOSIS — Z880 Allergy status to penicillin: Secondary | ICD-10-CM | POA: Diagnosis not present

## 2016-04-18 DIAGNOSIS — C3490 Malignant neoplasm of unspecified part of unspecified bronchus or lung: Secondary | ICD-10-CM | POA: Diagnosis present

## 2016-04-18 DIAGNOSIS — Z881 Allergy status to other antibiotic agents status: Secondary | ICD-10-CM | POA: Insufficient documentation

## 2016-04-18 DIAGNOSIS — I341 Nonrheumatic mitral (valve) prolapse: Secondary | ICD-10-CM | POA: Diagnosis not present

## 2016-04-18 DIAGNOSIS — C9110 Chronic lymphocytic leukemia of B-cell type not having achieved remission: Secondary | ICD-10-CM | POA: Diagnosis not present

## 2016-04-18 HISTORY — PX: VIDEO BRONCHOSCOPY: SHX5072

## 2016-04-18 LAB — COMPREHENSIVE METABOLIC PANEL
ALBUMIN: 4 g/dL (ref 3.5–5.0)
ALT: 11 U/L — ABNORMAL LOW (ref 14–54)
ANION GAP: 8 (ref 5–15)
AST: 21 U/L (ref 15–41)
Alkaline Phosphatase: 57 U/L (ref 38–126)
BUN: 19 mg/dL (ref 6–20)
CHLORIDE: 101 mmol/L (ref 101–111)
CO2: 28 mmol/L (ref 22–32)
Calcium: 8.6 mg/dL — ABNORMAL LOW (ref 8.9–10.3)
Creatinine, Ser: 0.62 mg/dL (ref 0.44–1.00)
GFR calc Af Amer: >60 mL/min (ref >60)
Glucose, Bld: 162 mg/dL — ABNORMAL HIGH (ref 65–99)
POTASSIUM: 4.8 mmol/L (ref 3.5–5.1)
Sodium: 137 mmol/L (ref 135–145)
Total Bilirubin: 0.5 mg/dL (ref 0.3–1.2)
Total Protein: 6.5 g/dL (ref 6.5–8.1)

## 2016-04-18 LAB — BLOOD GAS, ARTERIAL
Acid-Base Excess: 1.7 mmol/L (ref 0.0–2.0)
Bicarbonate: 28.8 mmol/L — ABNORMAL HIGH (ref 20.0–28.0)
Drawn by: 331471
O2 Content: 4 L/min
O2 SAT: 93.1 %
PATIENT TEMPERATURE: 98.6
PO2 ART: 71.4 mmHg — AB (ref 83.0–108.0)
pCO2 arterial: 58.8 mmHg — ABNORMAL HIGH (ref 32.0–48.0)
pH, Arterial: 7.312 — ABNORMAL LOW (ref 7.350–7.450)

## 2016-04-18 LAB — PROCALCITONIN

## 2016-04-18 SURGERY — VIDEO BRONCHOSCOPY WITHOUT FLUORO
Anesthesia: Moderate Sedation | Laterality: Bilateral

## 2016-04-18 MED ORDER — MEPERIDINE HCL 25 MG/ML IJ SOLN
INTRAMUSCULAR | Status: DC | PRN
Start: 2016-04-18 — End: 2016-04-18
  Administered 2016-04-18 (×2): 50 mg via INTRAVENOUS

## 2016-04-18 MED ORDER — MEPERIDINE HCL 100 MG/ML IJ SOLN: 100.0000 mg | Freq: Once | INTRAMUSCULAR | Status: DC

## 2016-04-18 MED ORDER — ALBUTEROL SULFATE (2.5 MG/3ML) 0.083% IN NEBU: 2.5000 mg | INHALATION_SOLUTION | RESPIRATORY_TRACT | Status: DC | PRN

## 2016-04-18 MED ORDER — MEPERIDINE HCL 100 MG/ML IJ SOLN
INTRAMUSCULAR | Status: AC
  Filled 2016-04-18: qty 2

## 2016-04-18 MED ORDER — ALPRAZOLAM 0.5 MG PO TABS
0.5000 mg | ORAL_TABLET | Freq: Two times a day (BID) | ORAL | Status: DC | PRN
  Administered 2016-04-19: 0.5 mg via ORAL
  Filled 2016-04-18: qty 1

## 2016-04-18 MED ORDER — NALOXONE HCL 0.4 MG/ML IJ SOLN
INTRAMUSCULAR | Status: DC | PRN
  Administered 2016-04-18: 0.4 mg via INTRAVENOUS

## 2016-04-18 MED ORDER — PHENYLEPHRINE HCL 0.25 % NA SOLN
NASAL | Status: DC | PRN
  Administered 2016-04-18: 2 via NASAL

## 2016-04-18 MED ORDER — METHYLPREDNISOLONE SODIUM SUCC 40 MG IJ SOLR
40.0000 mg | Freq: Two times a day (BID) | INTRAMUSCULAR | Status: DC
  Administered 2016-04-18 – 2016-04-19 (×2): 40 mg via INTRAVENOUS
  Filled 2016-04-18 (×2): qty 1

## 2016-04-18 MED ORDER — MIDAZOLAM HCL 5 MG/ML IJ SOLN
INTRAMUSCULAR | Status: AC
  Filled 2016-04-18: qty 2

## 2016-04-18 MED ORDER — SERTRALINE HCL 50 MG PO TABS
25.0000 mg | ORAL_TABLET | Freq: Every day | ORAL | Status: DC
  Administered 2016-04-18 – 2016-04-19 (×2): 25 mg via ORAL
  Filled 2016-04-18 (×4): qty 1

## 2016-04-18 MED ORDER — METOPROLOL SUCCINATE ER 25 MG PO TB24
25.0000 mg | ORAL_TABLET | Freq: Every day | ORAL | Status: DC
  Administered 2016-04-18 – 2016-04-19 (×2): 25 mg via ORAL
  Filled 2016-04-18 (×2): qty 1

## 2016-04-18 MED ORDER — MIDAZOLAM HCL 10 MG/2ML IJ SOLN
INTRAMUSCULAR | Status: DC | PRN
  Administered 2016-04-18 (×2): 2.5 mg via INTRAVENOUS

## 2016-04-18 MED ORDER — ALBUTEROL SULFATE (2.5 MG/3ML) 0.083% IN NEBU
2.5000 mg | INHALATION_SOLUTION | Freq: Once | RESPIRATORY_TRACT | Status: AC
Start: 2016-04-18 — End: 2016-04-18
  Administered 2016-04-18: 2.5 mg via RESPIRATORY_TRACT

## 2016-04-18 MED ORDER — MIDAZOLAM HCL 5 MG/ML IJ SOLN: 1.0000 mg | Freq: Once | INTRAMUSCULAR | Status: DC

## 2016-04-18 MED ORDER — METHYLPREDNISOLONE SODIUM SUCC 125 MG IJ SOLR
125.0000 mg | Freq: Once | INTRAMUSCULAR | Status: AC
  Administered 2016-04-18: 125 mg via INTRAVENOUS
  Filled 2016-04-18: qty 2

## 2016-04-18 MED ORDER — PHENYLEPHRINE HCL 0.25 % NA SOLN: 1.0000 | Freq: Four times a day (QID) | NASAL | Status: DC | PRN

## 2016-04-18 MED ORDER — LIDOCAINE HCL 2 % EX GEL
CUTANEOUS | Status: DC | PRN
  Administered 2016-04-18: 1

## 2016-04-18 MED ORDER — LIDOCAINE HCL 1 % IJ SOLN
INTRAMUSCULAR | Status: DC | PRN
  Administered 2016-04-18: 6 mL via RESPIRATORY_TRACT

## 2016-04-18 MED ORDER — SODIUM CHLORIDE 0.9 % IV SOLN
INTRAVENOUS | Status: DC
  Administered 2016-04-18: 08:00:00 via INTRAVENOUS

## 2016-04-18 MED ORDER — ARFORMOTEROL TARTRATE 15 MCG/2ML IN NEBU
15.0000 ug | INHALATION_SOLUTION | Freq: Two times a day (BID) | RESPIRATORY_TRACT | Status: DC
  Administered 2016-04-18 – 2016-04-19 (×2): 15 ug via RESPIRATORY_TRACT
  Filled 2016-04-18 (×3): qty 2

## 2016-04-18 MED ORDER — TEMAZEPAM 15 MG PO CAPS
30.0000 mg | ORAL_CAPSULE | Freq: Every day | ORAL | Status: DC
  Administered 2016-04-18: 30 mg via ORAL
  Filled 2016-04-18 (×2): qty 2

## 2016-04-18 MED ORDER — LIDOCAINE HCL 2 % EX GEL: 1.0000 "application " | Freq: Once | CUTANEOUS | Status: DC

## 2016-04-18 MED ORDER — BUDESONIDE 0.5 MG/2ML IN SUSP
0.5000 mg | Freq: Two times a day (BID) | RESPIRATORY_TRACT | Status: DC
  Administered 2016-04-18 – 2016-04-19 (×2): 0.5 mg via RESPIRATORY_TRACT
  Filled 2016-04-18 (×2): qty 2

## 2016-04-18 NOTE — Op Note (Signed)
Bronchoscopy Procedure Note  Date of Operation: 04/18/2016   Pre-op Diagnosis: lung ca  Post-op Diagnosis: same  Surgeon: Christinia Gully  Anesthesia: Monitored Local Anesthesia with Sedation Time Started: 0815 given versed 5 mg /demerol 50 mg iv and then second 50 mg IV for cough/restless /reaching for IV Time Stopped:  0830 reversed with narcan 0.8 mg IV   Operation: Video Flexible fiberoptic bronchoscopy, diagnostic   Findings: extensive ropy mucus plugs/ LLL sup segment 75% obst with mass extending into basal orifice laterally but not obstructing  Specimen: BAL LLL/ endobronchial bx x 1 LLL orifice  Estimated Blood Loss: < 15 cc  Complications: desats requiring 02 and narcan following the procedure   Indications and History: See updated H and P same date. The risks, benefits, complications, treatment options and expected outcomes were discussed with the patient.  The possibilities of reaction to medication, pulmonary aspiration, perforation of a viscus, bleeding, failure to diagnose a condition and creating a complication requiring transfusion or operation were discussed with the patient who freely signed the consent.    Description of Procedure: The patient was re-examined in the bronchoscopy suite and the site of surgery properly noted/marked.  The patient was identified  and the procedure verified as Flexible Fiberoptic Bronchoscopy.  A Time Out was held and the above information confirmed.   After the induction of topical nasopharyngeal anesthesia, the patient was positioned  and the bronchoscope was passed through the R naris. The vocal cords were visualized and  1% buffered lidocaine 5 ml was topically placed onto the cords. The cords were nl. The scope was then passed into the trachea.  1% buffered lidocaine given topically. Airways inspected bilaterally to the subsegmental level with the following findings:  Extensive ropy mucus plugs/ LLL sup segment 75% obst with mass  extending into basal orifice laterally but not obstructing   Interventions:   BAL LLL/ endobronchial bx x 1 LLL orifice    The Patient will be observed in bronch lab and given neb alb/ solumedrol and consideration for admit if not improved  Attestation: I performed the procedure.  Christinia Gully, MD Pulmonary and [place] [phone] After 5:30 PM or weekends, call [phone]

## 2016-04-18 NOTE — Progress Notes (Signed)
Video Bronchoscopy done  Intervention Bronchial washing done Intervention Bronchial biopsy done 

## 2016-04-18 NOTE — Discharge Instructions (Signed)
Flexible Bronchoscopy, Care After These instructions give you information on caring for yourself after your procedure. Your doctor may also give you more specific instructions. Call your doctor if you have any problems or questions after your procedure. Follow these instructions at home:  Do not eat or drink anything for 2 hours after your procedure. If you try to eat or drink before the medicine wears off, food or drink could go into your lungs. You could also burn yourself.  After 2 hours have passed and when you can cough and gag normally, you may eat soft food and drink liquids slowly.  The day after the test, you may eat your normal diet.  You may do your normal activities.  Keep all doctor visits. Get help right away if:  You get more and more short of breath.  You get light-headed.  You feel like you are going to pass out (faint).  You have chest pain.  You have new problems that worry you.  You cough up more than a little blood.  You cough up more blood than before. This information is not intended to replace advice given to you by your health care provider. Make sure you discuss any questions you have with your health care provider. Document Released: 01/28/2009 Document Revised: 09/08/2015 Document Reviewed: 12/05/2012 Elsevier Interactive Patient Education  2017 [place].  Nothing to  Eat or drink until    11:30   am  Today   04/18/2016
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
## 2016-04-18 NOTE — Progress Notes (Signed)
Post bronch visit interval  Resting comfortably  No distress. Actually feels better than baseline.  -On my arrival O2 sats 92-93% on 4 liters.  -Apparently on arrival to Endo was 87%. Desaturated during procedure so it was aborted after 1 bx from the LLL.  -received narcan and solumedrol.  -on my arrival around 0940 she's awake, oriented. Speaking full sentences. Removed Oxygen and O2 sats dropped as low as 82%. She reported "I feel fine" . Suspect there is definitely a chronic component here but even w/ 4 liters her sats dip to 87-88% and this is at rest. Think best to put her in over-night. Will treat as AECOPD, get f/u CXR and be sure to check walking oximetry prior to dc  Will update her son   Sheri Ayala ACNP-BC [place] Pager # [phone] OR # [phone] if no answer

## 2016-04-19 ENCOUNTER — Encounter (HOSPITAL_COMMUNITY): Payer: Self-pay | Admitting: Internal Medicine

## 2016-04-19 DIAGNOSIS — J9621 Acute and chronic respiratory failure with hypoxia: Secondary | ICD-10-CM | POA: Diagnosis not present

## 2016-04-19 DIAGNOSIS — J449 Chronic obstructive pulmonary disease, unspecified: Secondary | ICD-10-CM | POA: Diagnosis not present

## 2016-04-19 DIAGNOSIS — J441 Chronic obstructive pulmonary disease with (acute) exacerbation: Secondary | ICD-10-CM | POA: Diagnosis not present

## 2016-04-19 MED ORDER — PREDNISONE 10 MG (21) PO TBPK: ORAL_TABLET | ORAL | 0 refills | Status: DC

## 2016-04-19 MED ORDER — ACETAMINOPHEN 325 MG PO TABS
650.0000 mg | ORAL_TABLET | Freq: Four times a day (QID) | ORAL | Status: DC | PRN
  Administered 2016-04-19: 650 mg via ORAL
  Filled 2016-04-19: qty 2

## 2016-04-19 NOTE — Progress Notes (Signed)
SATURATION QUALIFICATIONS: (This note is used to comply with regulatory documentation for home oxygen)  Patient Saturations on Room Air at Rest = 86%  Patient Saturations on Room Air while Ambulating = NA%  Patient Saturations on 4 Liters of oxygen while Ambulating = 95%  Please briefly explain why patient needs home oxygen: Pt with 02 sats of 86 just sitting on edge of bed.  O2 sats up to 95 once 4L O2 placed

## 2016-04-19 NOTE — Progress Notes (Signed)
Pt to dc home with home 02. Desaturation screen done by nursing and MD order received. [facility] [employer] rep contacted for 02. No other CM needs communicated. Marney Doctor RN,BSN,NCM [phone]

## 2016-04-19 NOTE — Progress Notes (Signed)
Pt discharged home with son in stable condition.  Discharge instructions given.  Scripts sent to pharmacy of choice.  No immediate questions or concerns. 02 tanks delivered to room.
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
## 2016-04-23 ENCOUNTER — Ambulatory Visit (INDEPENDENT_AMBULATORY_CARE_PROVIDER_SITE_OTHER): Payer: Medicare HMO | Admitting: Internal Medicine

## 2016-04-23 ENCOUNTER — Encounter: Payer: Self-pay | Admitting: Internal Medicine

## 2016-04-23 VITALS — BP 132/70 | HR 62 | Ht 69.0 in | Wt 130.0 lb

## 2016-04-23 DIAGNOSIS — R918 Other nonspecific abnormal finding of lung field: Secondary | ICD-10-CM | POA: Diagnosis not present

## 2016-04-23 DIAGNOSIS — J449 Chronic obstructive pulmonary disease, unspecified: Secondary | ICD-10-CM | POA: Diagnosis not present

## 2016-04-23 DIAGNOSIS — R0602 Shortness of breath: Secondary | ICD-10-CM
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
## 2016-04-23 NOTE — Patient Instructions (Addendum)
Keep your appt for the PET scan and I will call you with results and arrange appropriate follow up   Congratulations on not smoking - keep up the good work!  Late add: rec continue symbicort 160 2bid

## 2016-04-26 ENCOUNTER — Other Ambulatory Visit: Payer: Self-pay | Admitting: *Deleted

## 2016-04-27 ENCOUNTER — Telehealth: Payer: Self-pay | Admitting: *Deleted

## 2016-04-27 NOTE — Telephone Encounter (Signed)
Oncology Nurse Navigator Documentation  Oncology Nurse Navigator Flowsheets 04/27/2016  Navigator Location [facility]  Navigator Encounter Type Telephone  Telephone Outgoing Call/per Dr. Gearldine Shown request, I called Sheri Ayala.  I updated her on her recently pathology.  It did not show malignancy.  Dr. Benay Spice would like patient to keep her appt for PET scan and then decide the best way to obtain tissue.  I updated Sheri Ayala. She was thankful for the call.    Treatment Phase Pre-Tx/Tx Discussion  Barriers/Navigation Needs Education  Education Other  Interventions Education  Education Method Verbal  Acuity Level 1  Acuity Level 1 Minimal follow up required  Time Spent with Patient 30

## 2016-04-29 ENCOUNTER — Encounter: Payer: Self-pay | Admitting: Internal Medicine

## 2016-04-29 MED ORDER — BUDESONIDE-FORMOTEROL FUMARATE 160-4.5 MCG/ACT IN AERO: INHALATION_SPRAY | RESPIRATORY_TRACT | 11 refills | Status: DC

## 2016-04-29 NOTE — Assessment & Plan Note (Addendum)
Quit smoking Jan 2018 - Spirometry 04/23/2016  FEV1 0.88 (31%)  Ratio 47  p am symbicort 160   Clearly not a candidate for LLLobectomy as she is more severe than her symptoms would suggest. For now now change in recs :  symb 160 2bid  04/23/2016  After extensive coaching HFA effectiveness =    75%   Congratulated for not smoking / encouraged to avoid resumption of tobacco/nicotine in any form at this point

## 2016-04-29 NOTE — Assessment & Plan Note (Signed)
See CT 03/27/16 - FOB 04/18/16 > mass 75% obst LLL sup segment extending into basal orifice laterally and desats during the procedure p washings/bx x one > neg   Next step is PET and decide what tissue best to pursue for dx   Discussed in detail all the  indications, usual  risks and alternatives  relative to the benefits with patient who agrees to proceed with w/u as outlined and then refer to oncology once we have a tissue dx   I had an extended discussion with the patient/fm reviewing all relevant studies completed to date and  lasting 15 to 20 minutes of a 25 minute visit    Each maintenance medication was reviewed in detail including most importantly the difference between maintenance and prns and under what circumstances the prns are to be triggered using an action plan format that is not reflected in the computer generated alphabetically organized AVS.    Please see AVS for specific instructions unique to this visit that I personally wrote and verbalized to the the pt in detail and then reviewed with pt  by my nurse highlighting any  changes in therapy recommended at today's visit to their plan of care.

## 2016-04-30 ENCOUNTER — Inpatient Hospital Stay (HOSPITAL_COMMUNITY)
Admission: EM | Admit: 2016-04-30 | Discharge: 2016-05-07 | DRG: 193 | Disposition: A | Discharge status: Home / Self Care | Payer: Medicare HMO | Attending: Internal Medicine | Admitting: Internal Medicine

## 2016-04-30 ENCOUNTER — Ambulatory Visit (HOSPITAL_COMMUNITY): Payer: Medicare HMO

## 2016-04-30 ENCOUNTER — Emergency Department (HOSPITAL_COMMUNITY): Payer: Medicare HMO

## 2016-04-30 ENCOUNTER — Encounter (HOSPITAL_COMMUNITY): Payer: Self-pay | Admitting: *Deleted

## 2016-04-30 ENCOUNTER — Encounter (HOSPITAL_COMMUNITY): Payer: Self-pay

## 2016-04-30 DIAGNOSIS — R946 Abnormal results of thyroid function studies: Secondary | ICD-10-CM | POA: Diagnosis present

## 2016-04-30 DIAGNOSIS — E43 Unspecified severe protein-calorie malnutrition: Secondary | ICD-10-CM | POA: Diagnosis present

## 2016-04-30 DIAGNOSIS — Z7951 Long term (current) use of inhaled steroids: Secondary | ICD-10-CM

## 2016-04-30 DIAGNOSIS — Z539 Procedure and treatment not carried out, unspecified reason: Secondary | ICD-10-CM

## 2016-04-30 DIAGNOSIS — J9621 Acute and chronic respiratory failure with hypoxia: Secondary | ICD-10-CM | POA: Diagnosis present

## 2016-04-30 DIAGNOSIS — Z8249 Family history of ischemic heart disease and other diseases of the circulatory system: Secondary | ICD-10-CM

## 2016-04-30 DIAGNOSIS — J440 Chronic obstructive pulmonary disease with acute lower respiratory infection: Secondary | ICD-10-CM | POA: Diagnosis present

## 2016-04-30 DIAGNOSIS — J441 Chronic obstructive pulmonary disease with (acute) exacerbation: Secondary | ICD-10-CM | POA: Diagnosis present

## 2016-04-30 DIAGNOSIS — Z90710 Acquired absence of both cervix and uterus: Secondary | ICD-10-CM

## 2016-04-30 DIAGNOSIS — I081 Rheumatic disorders of both mitral and tricuspid valves: Secondary | ICD-10-CM | POA: Diagnosis present

## 2016-04-30 DIAGNOSIS — E0781 Sick-euthyroid syndrome: Secondary | ICD-10-CM | POA: Diagnosis present

## 2016-04-30 DIAGNOSIS — Z8673 Personal history of transient ischemic attack (TIA), and cerebral infarction without residual deficits: Secondary | ICD-10-CM

## 2016-04-30 DIAGNOSIS — R918 Other nonspecific abnormal finding of lung field: Secondary | ICD-10-CM | POA: Diagnosis present

## 2016-04-30 DIAGNOSIS — J90 Pleural effusion, not elsewhere classified: Secondary | ICD-10-CM | POA: Diagnosis present

## 2016-04-30 DIAGNOSIS — Z681 Body mass index (BMI) 19 or less, adult: Secondary | ICD-10-CM | POA: Diagnosis not present

## 2016-04-30 DIAGNOSIS — C9110 Chronic lymphocytic leukemia of B-cell type not having achieved remission: Secondary | ICD-10-CM | POA: Diagnosis present

## 2016-04-30 DIAGNOSIS — F329 Major depressive disorder, single episode, unspecified: Secondary | ICD-10-CM | POA: Diagnosis present

## 2016-04-30 DIAGNOSIS — I959 Hypotension, unspecified: Secondary | ICD-10-CM | POA: Diagnosis not present

## 2016-04-30 DIAGNOSIS — J189 Pneumonia, unspecified organism: Principal | ICD-10-CM | POA: Diagnosis present

## 2016-04-30 DIAGNOSIS — X58XXXA Exposure to other specified factors, initial encounter: Secondary | ICD-10-CM | POA: Diagnosis not present

## 2016-04-30 DIAGNOSIS — Z880 Allergy status to penicillin: Secondary | ICD-10-CM

## 2016-04-30 DIAGNOSIS — I4891 Unspecified atrial fibrillation: Secondary | ICD-10-CM | POA: Diagnosis present

## 2016-04-30 DIAGNOSIS — Y92239 Unspecified place in hospital as the place of occurrence of the external cause: Secondary | ICD-10-CM | POA: Diagnosis not present

## 2016-04-30 DIAGNOSIS — I2781 Cor pulmonale (chronic): Secondary | ICD-10-CM | POA: Diagnosis present

## 2016-04-30 DIAGNOSIS — F419 Anxiety disorder, unspecified: Secondary | ICD-10-CM | POA: Diagnosis present

## 2016-04-30 DIAGNOSIS — J9622 Acute and chronic respiratory failure with hypercapnia: Secondary | ICD-10-CM | POA: Diagnosis present

## 2016-04-30 DIAGNOSIS — R64 Cachexia: Secondary | ICD-10-CM | POA: Diagnosis present

## 2016-04-30 DIAGNOSIS — G4700 Insomnia, unspecified: Secondary | ICD-10-CM | POA: Diagnosis present

## 2016-04-30 DIAGNOSIS — Z9981 Dependence on supplemental oxygen: Secondary | ICD-10-CM

## 2016-04-30 DIAGNOSIS — I482 Chronic atrial fibrillation, unspecified: Secondary | ICD-10-CM | POA: Diagnosis present

## 2016-04-30 DIAGNOSIS — Z91018 Allergy to other foods: Secondary | ICD-10-CM

## 2016-04-30 DIAGNOSIS — Z9889 Other specified postprocedural states: Secondary | ICD-10-CM

## 2016-04-30 DIAGNOSIS — Z881 Allergy status to other antibiotic agents status: Secondary | ICD-10-CM

## 2016-04-30 DIAGNOSIS — J09X1 Influenza due to identified novel influenza A virus with pneumonia: Secondary | ICD-10-CM | POA: Diagnosis not present

## 2016-04-30 DIAGNOSIS — Z79899 Other long term (current) drug therapy: Secondary | ICD-10-CM

## 2016-04-30 DIAGNOSIS — S161XXA Strain of muscle, fascia and tendon at neck level, initial encounter: Secondary | ICD-10-CM | POA: Diagnosis not present

## 2016-04-30 DIAGNOSIS — F17210 Nicotine dependence, cigarettes, uncomplicated: Secondary | ICD-10-CM | POA: Diagnosis present

## 2016-04-30 DIAGNOSIS — T461X5A Adverse effect of calcium-channel blockers, initial encounter: Secondary | ICD-10-CM | POA: Diagnosis not present

## 2016-04-30 DIAGNOSIS — J449 Chronic obstructive pulmonary disease, unspecified: Secondary | ICD-10-CM | POA: Diagnosis not present

## 2016-04-30 HISTORY — DX: Unspecified atrial fibrillation: I48.91

## 2016-04-30 LAB — BASIC METABOLIC PANEL
Anion gap: 7 (ref 5–15)
BUN: 15 mg/dL (ref 6–20)
CO2: 29 mmol/L (ref 22–32)
CREATININE: 0.58 mg/dL (ref 0.44–1.00)
Calcium: 7.3 mg/dL — ABNORMAL LOW (ref 8.9–10.3)
Chloride: 103 mmol/L (ref 101–111)
GFR calc Af Amer: >60 mL/min (ref >60)
GFR calc non Af Amer: >60 mL/min (ref >60)
GLUCOSE: 94 mg/dL (ref 65–99)
Potassium: 4.1 mmol/L (ref 3.5–5.1)
Sodium: 139 mmol/L (ref 135–145)

## 2016-04-30 LAB — CBC
HCT: 38.7 % (ref 36.0–46.0)
Hemoglobin: 12.6 g/dL (ref 12.0–15.0)
MCH: 31.2 pg (ref 26.0–34.0)
MCHC: 32.6 g/dL (ref 30.0–36.0)
MCV: 95.8 fL (ref 78.0–100.0)
PLATELETS: 240 10*3/uL (ref 150–400)
RBC: 4.04 MIL/uL (ref 3.87–5.11)
RDW: 14.7 % (ref 11.5–15.5)
WBC: 35.1 10*3/uL — ABNORMAL HIGH (ref 4.0–10.5)

## 2016-04-30 LAB — I-STAT TROPONIN, ED: TROPONIN I, POC: 0.02 ng/mL (ref 0.00–0.08)

## 2016-04-30 LAB — EXPECTORATED SPUTUM ASSESSMENT W GRAM STAIN, RFLX TO RESP C

## 2016-04-30 LAB — MAGNESIUM: Magnesium: 1.7 mg/dL (ref 1.7–2.4)

## 2016-04-30 LAB — I-STAT CG4 LACTIC ACID, ED: Lactic Acid, Venous: 0.83 mmol/L (ref 0.5–1.9)

## 2016-04-30 LAB — MRSA PCR SCREENING: MRSA BY PCR: NEGATIVE

## 2016-04-30 LAB — TSH: TSH: 0.31 u[IU]/mL — ABNORMAL LOW (ref 0.350–4.500)

## 2016-04-30 LAB — BRAIN NATRIURETIC PEPTIDE: B Natriuretic Peptide: 234 pg/mL — ABNORMAL HIGH (ref 0.0–100.0)

## 2016-04-30 LAB — EXPECTORATED SPUTUM ASSESSMENT W REFEX TO RESP CULTURE

## 2016-04-30 MED ORDER — SODIUM CHLORIDE 0.9% FLUSH
3.0000 mL | Freq: Two times a day (BID) | INTRAVENOUS | Status: DC
  Administered 2016-04-30 – 2016-05-07 (×13): 3 mL via INTRAVENOUS

## 2016-04-30 MED ORDER — DEXTROSE 5 % IV SOLN
2.0000 g | Freq: Once | INTRAVENOUS | Status: AC
  Administered 2016-04-30: 2 g via INTRAVENOUS
  Filled 2016-04-30: qty 2

## 2016-04-30 MED ORDER — SODIUM CHLORIDE 0.9% FLUSH: 3.0000 mL | INTRAVENOUS | Status: DC | PRN

## 2016-04-30 MED ORDER — SERTRALINE HCL 25 MG PO TABS
25.0000 mg | ORAL_TABLET | Freq: Every day | ORAL | Status: DC
  Administered 2016-04-30 – 2016-05-07 (×8): 25 mg via ORAL
  Filled 2016-04-30 (×8): qty 1

## 2016-04-30 MED ORDER — IPRATROPIUM BROMIDE 0.02 % IN SOLN
0.5000 mg | Freq: Four times a day (QID) | RESPIRATORY_TRACT | Status: DC
  Administered 2016-04-30: 0.5 mg via RESPIRATORY_TRACT
  Filled 2016-04-30: qty 2.5

## 2016-04-30 MED ORDER — IPRATROPIUM BROMIDE 0.02 % IN SOLN
0.5000 mg | Freq: Three times a day (TID) | RESPIRATORY_TRACT | Status: DC
  Administered 2016-05-01: 0.5 mg via RESPIRATORY_TRACT
  Filled 2016-04-30: qty 2.5

## 2016-04-30 MED ORDER — LEVALBUTEROL HCL 0.63 MG/3ML IN NEBU
0.6300 mg | INHALATION_SOLUTION | Freq: Four times a day (QID) | RESPIRATORY_TRACT | Status: DC
  Administered 2016-04-30: 0.63 mg via RESPIRATORY_TRACT
  Filled 2016-04-30: qty 3

## 2016-04-30 MED ORDER — SODIUM CHLORIDE 0.9 % IV BOLUS (SEPSIS)
1000.0000 mL | Freq: Once | INTRAVENOUS | Status: AC
  Administered 2016-04-30: 14:00:00 via INTRAVENOUS

## 2016-04-30 MED ORDER — SODIUM CHLORIDE 0.9 % IV SOLN
INTRAVENOUS | Status: AC
  Administered 2016-04-30 – 2016-05-01 (×2): via INTRAVENOUS

## 2016-04-30 MED ORDER — DILTIAZEM HCL-DEXTROSE 100-5 MG/100ML-% IV SOLN (PREMIX)
5.0000 mg/h | INTRAVENOUS | Status: DC
  Administered 2016-04-30 (×2): 5 mg/h via INTRAVENOUS
  Filled 2016-04-30 (×2): qty 100

## 2016-04-30 MED ORDER — ALPRAZOLAM 0.5 MG PO TABS
0.5000 mg | ORAL_TABLET | Freq: Two times a day (BID) | ORAL | Status: DC | PRN
  Administered 2016-04-30 – 2016-05-07 (×13): 0.5 mg via ORAL
  Filled 2016-04-30 (×14): qty 1

## 2016-04-30 MED ORDER — ENSURE ENLIVE PO LIQD
237.0000 mL | Freq: Two times a day (BID) | ORAL | Status: DC
  Administered 2016-05-01 – 2016-05-07 (×11): 237 mL via ORAL

## 2016-04-30 MED ORDER — AZTREONAM IN DEXTROSE 1 GM/50ML IV SOLN
1.0000 g | Freq: Three times a day (TID) | INTRAVENOUS | Status: DC
  Administered 2016-04-30 – 2016-05-02 (×5): 1 g via INTRAVENOUS
  Filled 2016-04-30 (×6): qty 50

## 2016-04-30 MED ORDER — ORAL CARE MOUTH RINSE
15.0000 mL | Freq: Two times a day (BID) | OROMUCOSAL | Status: DC
  Administered 2016-04-30 – 2016-05-07 (×11): 15 mL via OROMUCOSAL

## 2016-04-30 MED ORDER — SODIUM CHLORIDE 0.9 % IV BOLUS (SEPSIS)
500.0000 mL | Freq: Once | INTRAVENOUS | Status: AC
  Administered 2016-04-30: 500 mL via INTRAVENOUS

## 2016-04-30 MED ORDER — SODIUM CHLORIDE 0.9 % IV BOLUS (SEPSIS)
1000.0000 mL | Freq: Once | INTRAVENOUS | Status: AC
  Administered 2016-04-30: 1000 mL via INTRAVENOUS

## 2016-04-30 MED ORDER — LEVALBUTEROL HCL 0.63 MG/3ML IN NEBU
0.6300 mg | INHALATION_SOLUTION | Freq: Three times a day (TID) | RESPIRATORY_TRACT | Status: DC
  Administered 2016-05-01: 0.63 mg via RESPIRATORY_TRACT
  Filled 2016-04-30: qty 3

## 2016-04-30 MED ORDER — SODIUM CHLORIDE 0.9 % IV SOLN
500.0000 mg | Freq: Two times a day (BID) | INTRAVENOUS | Status: DC
  Administered 2016-05-01: 500 mg via INTRAVENOUS
  Filled 2016-04-30 (×3): qty 500

## 2016-04-30 MED ORDER — DILTIAZEM LOAD VIA INFUSION
15.0000 mg | Freq: Once | INTRAVENOUS | Status: AC
  Administered 2016-04-30: 15 mg via INTRAVENOUS
  Filled 2016-04-30: qty 15

## 2016-04-30 MED ORDER — METHYLPREDNISOLONE SODIUM SUCC 40 MG IJ SOLR
40.0000 mg | Freq: Two times a day (BID) | INTRAMUSCULAR | Status: DC
  Administered 2016-04-30 – 2016-05-02 (×5): 40 mg via INTRAVENOUS
  Filled 2016-04-30 (×6): qty 1

## 2016-04-30 MED ORDER — SODIUM CHLORIDE 0.9 % IV SOLN: 250.0000 mL | INTRAVENOUS | Status: DC | PRN

## 2016-04-30 MED ORDER — AZTREONAM 1 G IJ SOLR: 1.0000 g | Freq: Three times a day (TID) | INTRAMUSCULAR | Status: DC

## 2016-04-30 MED ORDER — TEMAZEPAM 15 MG PO CAPS
15.0000 mg | ORAL_CAPSULE | Freq: Every day | ORAL | Status: DC
  Administered 2016-04-30 – 2016-05-06 (×7): 15 mg via ORAL
  Filled 2016-04-30 (×7): qty 1

## 2016-04-30 MED ORDER — HEPARIN SODIUM (PORCINE) 5000 UNIT/ML IJ SOLN
5000.0000 [IU] | Freq: Three times a day (TID) | INTRAMUSCULAR | Status: DC
  Administered 2016-04-30 – 2016-05-01 (×2): 5000 [IU] via SUBCUTANEOUS
  Filled 2016-04-30 (×2): qty 1

## 2016-04-30 MED ORDER — VANCOMYCIN HCL IN DEXTROSE 1-5 GM/200ML-% IV SOLN
1000.0000 mg | Freq: Once | INTRAVENOUS | Status: AC
  Administered 2016-04-30: 1000 mg via INTRAVENOUS
  Filled 2016-04-30: qty 200

## 2016-04-30 NOTE — Plan of Care (Signed)
Problem: Nutrition: Goal: Adequate nutrition will be maintained Outcome: Not Met (add Reason) New admission, lost >10lbs in past 10 days.

## 2016-04-30 NOTE — ED Notes (Signed)
Spoke with lab regarding results, per lab they were running CBC twice because of the different results; CBC, BMP and BNP were redrawn and sent to lab.

## 2016-04-30 NOTE — ED Provider Notes (Signed)
[facility] DEPT Provider Note   CSN: [id number] Arrival date & time: 04/30/16  1100     History   Chief Complaint Chief Complaint  Patient presents with  . Shortness of Breath  . Fever  . Nausea    HPI Sheri Ayala is a 66 y.o. female.  HPI Patient presented to the emergency room with complaints of shortness of breath and heart palpitations.  Patient has history of COPD as well as atrial fibrillation.  She had a bronchoscopy performed on January 3 for evaluation of a mass in the left lung noted on an incidental x-ray. Patient states over the last several days she's had worsening trouble with cough and congestion. She has also experienced tachycardia and heart palpitations.  She was scheduled to have a follow-up PET scan today. She was feeling too short of breath and felt very weak and she called 911 to be evaluated in the emergency room. Patient is no longer smoking. She quit 2 weeks ago. She has not been bringing up any sputum but she has been coughing a lot. She has felt feverish. She denies any leg swelling. She denies any chest pain. Past Medical History:  Diagnosis Date  . Allergy   . Anxiety   . Asthma   . Bronchospasm   . Cerebrovascular accident (CVA) ([place]) 08/28/2015  . Chronic lymphocytic leukemia ([place]) 2004  . COPD (chronic obstructive pulmonary disease) ([place])   . Depression   . Emphysema of lung ([place])   . Frontal sinusitis November 2012  . H/O viral encephalitis   . Headache(784.0)   . Heart murmur   . Mitral prolapse 11/24/2015  . Mitral regurgitation 11/24/2015  . Olecranon bursitis of left elbow November 2012.   in past  . Pleurisy   . Pneumonia    13 times  . Rhinitis   . Thrombocytopenia ([place])   . Tobacco abuse   . Tricuspid valve regurgitation 11/24/2015    Patient Active Problem List   Diagnosis Date Noted  . HCAP (healthcare-associated pneumonia) 04/30/2016  . Atrial fibrillation with rapid ventricular response ([place]) 04/30/2016  . Severe  protein-calorie malnutrition ([place])   . Acute on chronic respiratory failure with hypoxemia ([place]) 04/18/2016  . COPD GOLD III  04/10/2016  . Mass of left lung 04/10/2016  . Acute on chronic respiratory failure with hypoxia ([place]) 01/30/2015  . Right lower lobe pneumonia ([place]) 01/30/2015  . Generalized weakness 01/30/2015  . COPD exacerbation ([place]) 01/29/2015  . SOB (shortness of breath) 12/24/2012  . Dizziness 12/22/2012  . COPD with acute exacerbation ([place]) 12/22/2012  . Anemia 03/28/2011  . Cellulitis of both external ears 03/27/2011  . Headache in front of head 03/27/2011  . Thrombocytopenia ([place]) 03/27/2011  . Acute frontal sinusitis 03/10/2011  . Cellulitis of elbow 03/09/2011  . Bursitis of elbow 03/09/2011  . CLL (chronic lymphocytic leukemia) ([place]) 03/09/2011  . Cigarette smoker 03/09/2011    Past Surgical History:  Procedure Laterality Date  . ABDOMINAL HYSTERECTOMY    . COLONOSCOPY    . UPPER GASTROINTESTINAL ENDOSCOPY    . VIDEO BRONCHOSCOPY Bilateral 04/18/2016   Procedure: VIDEO BRONCHOSCOPY WITHOUT FLUORO;  Surgeon: Tanda Rockers, MD;  Location: [facility] END[facility];  Service: Cardiopulmonary;  Laterality: Bilateral;    OB History    Gravida Para Term Preterm AB Living   '2 2 2     2   '$ SAB TAB Ectopic Multiple Live Births  Home Medications    Prior to Admission medications   Medication Sig Start Date End Date Taking? Authorizing Provider  albuterol (PROVENTIL) (2.5 MG/3ML) 0.083% nebulizer solution Take 3 mLs (2.5 mg total) by nebulization every 4 (four) hours as needed for wheezing or shortness of breath. 06/21/13  Yes Francine Graven, DO  ALPRAZolam Duanne Moron) 0.5 MG tablet Take 0.5 mg by mouth 2 (two) times daily as needed for anxiety or sleep.  12/27/14  Yes Historical Provider, MD  budesonide-formoterol (SYMBICORT) 160-4.5 MCG/ACT inhaler Take 2 puffs first thing in am and then another 2 puffs about 12 hours later. 04/29/16  Yes Tanda Rockers, MD    metoprolol succinate (TOPROL-XL) 25 MG 24 hr tablet Take 25 mg by mouth daily. 04/05/16  Yes Historical Provider, MD  predniSONE (STERAPRED UNI-PAK 21 TAB) 10 MG (21) TBPK tablet Take 4 tabs  daily with food x 4 days, then 3 tabs daily x 4 days, then 2 tabs daily x 4 days, then 1 tab daily x4 days then stop. #40 04/19/16  Yes William S Minor, NP  sertraline (ZOLOFT) 25 MG tablet Take 25 mg by mouth daily. 04/05/16  Yes Historical Provider, MD  temazepam (RESTORIL) 30 MG capsule Take 30 mg by mouth at bedtime. 04/05/16  Yes Historical Provider, MD  VENTOLIN HFA 108 (90 Base) MCG/ACT inhaler Inhale 2 puffs into the lungs every 6 (six) hours as needed for shortness of breath. 04/06/16  Yes Historical Provider, MD    Family History Family History  Problem Relation Age of Onset  . Aneurysm Mother   . Liver disease Father   . Congestive Heart Failure Brother   . Heart disease Brother   . Colon cancer Neg Hx   . Esophageal cancer Neg Hx   . Pancreatic cancer Neg Hx   . Rectal cancer Neg Hx   . Stomach cancer Neg Hx     Social History Social History  Substance Use Topics  . Smoking status: Current Every Day Smoker    Packs/day: 0.50    Years: 45.00    Types: Cigarettes, E-cigarettes  . Smokeless tobacco: Never Used  . Alcohol use No     Allergies   Citrus; Doxycycline; and Penicillins   Review of Systems Review of Systems  All other systems reviewed and are negative.    Physical Exam Updated Vital Signs BP 130/60   Pulse 98   Temp 99.3 F (37.4 C) (Oral)   Resp 18   Ht '5\' 10"'$  (1.778 m)   Wt 61.3 kg   SpO2 (!) 86%   BMI 19.39 kg/m   Physical Exam  Constitutional: No distress.  HENT:  Head: Normocephalic and atraumatic.  Right Ear: External ear normal.  Left Ear: External ear normal.  Eyes: Conjunctivae are normal. Right eye exhibits no discharge. Left eye exhibits no discharge. No scleral icterus.  Neck: Neck supple. No tracheal deviation present.   Cardiovascular: Intact distal pulses.  An irregularly irregular rhythm present. Tachycardia present.   Pulmonary/Chest: Effort normal and breath sounds normal. No accessory muscle usage or stridor. No respiratory distress. She has no wheezes. She has no rhonchi. She has no rales.  Abdominal: Soft. Bowel sounds are normal. She exhibits no distension. There is no tenderness. There is no rebound and no guarding.  Musculoskeletal: She exhibits no edema or tenderness.  Neurological: She is alert. She has normal strength. No cranial nerve deficit (no facial droop, extraocular movements intact, no slurred speech) or sensory deficit. She exhibits normal  muscle tone. She displays no seizure activity. Coordination normal.  Skin: Skin is warm and dry. No rash noted.  Psychiatric: She has a normal mood and affect.  Nursing note and vitals reviewed.    ED Treatments / Results  Labs (all labs ordered are listed, but only abnormal results are displayed) Labs Reviewed  CBC - Abnormal; Notable for the following:       Result Value   WBC 35.1 (*)    All other components within normal limits  BRAIN NATRIURETIC PEPTIDE - Abnormal; Notable for the following:    B Natriuretic Peptide 234.0 (*)    All other components within normal limits  BASIC METABOLIC PANEL - Abnormal; Notable for the following:    Calcium 7.3 (*)    All other components within normal limits  CULTURE, EXPECTORATED SPUTUM-ASSESSMENT  CULTURE, BLOOD (ROUTINE X 2)  CULTURE, BLOOD (ROUTINE X 2)  GRAM STAIN  URINE CULTURE  MRSA PCR SCREENING  CULTURE, RESPIRATORY (NON-EXPECTORATED)  HIV ANTIBODY (ROUTINE TESTING)  STREP PNEUMONIAE URINARY ANTIGEN  LEGIONELLA PNEUMOPHILA SEROGP 1 UR AG  BASIC METABOLIC PANEL  CBC  TSH  MAGNESIUM  I-STAT TROPOININ, ED  I-STAT CG4 LACTIC ACID, ED  I-STAT CG4 LACTIC ACID, ED    EKG  EKG Interpretation  Date/Time:  Monday April 30 2016 11:24:49 EST Ventricular Rate:  137 PR Interval:    QRS  Duration: 82 QT Interval:  279 QTC Calculation: 422 R Axis:   76 Text Interpretation:  Atrial fibrillation Borderline repolarization abnormality atrial fibrillation is new since last tracing Confirmed by Trishna Cwik  MD-J, Chiffon Kittleson ([id number]) on 04/30/2016 11:57:39 AM       Radiology Dg Chest Portable 1 View  Result Date: 04/30/2016 CLINICAL DATA:  Shortness of breath and chest pain today. Productive cough, nausea and fever. EXAM: PORTABLE CHEST 1 VIEW COMPARISON:  Single-view of the chest 04/18/2016. CT chest 03/27/2016. FINDINGS: The lungs are emphysematous. There is right worse than left basilar airspace disease. Heart size is normal. No pneumothorax or pleural effusion. IMPRESSION: Right worse left basilar airspace disease has an appearance most consistent with pneumonia. Emphysema. Electronically Signed   By: Inge Rise M.D.   On: 04/30/2016 12:19    Procedures .Critical Care Performed by: Dorie Rank Authorized by: Dorie Rank   Critical care provider statement:    Critical care time (minutes):  45   Critical care was time spent personally by me on the following activities:  Discussions with consultants, evaluation of patient's response to treatment, examination of patient, ordering and performing treatments and interventions, ordering and review of laboratory studies, ordering and review of radiographic studies, pulse oximetry, re-evaluation of patient's condition, obtaining history from patient or surrogate and review of old charts   (including critical care time)  Medications Ordered in ED Medications  diltiazem (CARDIZEM) 1 mg/mL load via infusion 15 mg (15 mg Intravenous Bolus from Bag 04/30/16 1233)    And  diltiazem (CARDIZEM) 100 mg in dextrose 5% 164m (1 mg/mL) infusion (5 mg/hr Intravenous Rate/Dose Change 04/30/16 2037)  vancomycin (VANCOCIN) 500 mg in sodium chloride 0.9 % 100 mL IVPB (not administered)  aztreonam (AZACTAM) 1 GM IVPB (not administered)  temazepam (RESTORIL)  capsule 15 mg (15 mg Oral Given 04/30/16 2036)  sertraline (ZOLOFT) tablet 25 mg (25 mg Oral Given 04/30/16 2035)  ALPRAZolam (Duanne Moron tablet 0.5 mg (0.5 mg Oral Given 04/30/16 2036)  methylPREDNISolone sodium succinate (SOLU-MEDROL) 40 mg/mL injection 40 mg (40 mg Intravenous Given 04/30/16 2036)  heparin injection 5,000  Units (5,000 Units Subcutaneous Given 04/30/16 2036)  0.9 %  sodium chloride infusion ( Intravenous New Bag/Given 04/30/16 2035)  sodium chloride flush (NS) 0.9 % injection 3 mL (3 mLs Intravenous Given 04/30/16 2048)  sodium chloride flush (NS) 0.9 % injection 3 mL (not administered)  0.9 %  sodium chloride infusion (not administered)  ipratropium (ATROVENT) nebulizer solution 0.5 mg (not administered)  levalbuterol (XOPENEX) nebulizer solution 0.63 mg (not administered)  feeding supplement (ENSURE ENLIVE) (ENSURE ENLIVE) liquid 237 mL (not administered)  MEDLINE mouth rinse (not administered)  sodium chloride 0.9 % bolus 500 mL (0 mLs Intravenous Stopped 04/30/16 1748)  sodium chloride 0.9 % bolus 1,000 mL (1,000 mLs Intravenous Transfusing/Transfer 04/30/16 1951)    And  sodium chloride 0.9 % bolus 1,000 mL (0 mLs Intravenous Stopped 04/30/16 1748)  aztreonam (AZACTAM) 2 g in dextrose 5 % 50 mL IVPB (0 g Intravenous Stopped 04/30/16 1707)  vancomycin (VANCOCIN) IVPB 1000 mg/200 mL premix (0 mg Intravenous Stopped 04/30/16 1901)     Initial Impression / Assessment and Plan / ED Course  I have reviewed the triage vital signs and the nursing notes.  Pertinent labs & imaging results that were available during my care of the patient were reviewed by me and considered in my medical decision making (see chart for details).  Clinical Course as of Apr 30 2142  Mon Apr 30, 2016  1128 BP: 123/63 [JK]  1408 Heart rate improving.  CXR shows PNA.  Doubt sepsis based on her clinical appearance but will give fluid bolus, add on cultures, send lactic acid level.  [JK]    Clinical Course User  Index [JK] Dorie Rank, MD    Pt presented to the ED with complaints of shortness of breath and tachycardia.  CXR shows pna.  Elevated WBC however pt has CLL and has been on steroids.   A fib with RVR on ECG.  Started on cardizem drip with improvement.  Lactic acid level not elevated.  Doubt sepsis.  Stabilized in the ED and admitted for further treatment.  Final Clinical Impressions(s) / ED Diagnoses   Final diagnoses:  HCAP (healthcare-associated pneumonia)  Atrial fibrillation with rapid ventricular response [facility])    New Prescriptions Current Discharge Medication List       Dorie Rank, MD 04/30/16 2145

## 2016-04-30 NOTE — H&P (Signed)
History and Physical    Sheri Ayala ZDG:[id number] DOB: 05/22/1950 DOA: 04/30/2016  Referring Provider: Dr. Tomi Bamberger PCP: Alvester Chou, NP    Patient coming from: home  Chief Complaint: subjective fever, worsening SOB and palpitations   HPI: Sheri Ayala is a 66 y.o. female with PMH significant for COPD (on chronic oxygen supplementation, CLL, tobacco abuse (quit 2 weeks ago), depression, anxiety, left lung mass and hx of CVA (w/o residual deficit); presented with worsening SOB, subjective fever and palpitations. Patient jsut recently admitted for bronchoscopy and biopsy of her left lung mass; subsequently developing COPD exacerbation and resp failure; patient discharge on bactrim and slow steroid tapering. She finish antibiotics and even doing poorly was waiting for her follow up appointment. about 24-48 hours prior to admission started having subjective fever, palpitations, coughing spells, left side chest discomfort with cough and generalized weakness.   Of note, patient denies, dysuria, hematuria, melena, hematochezia, abd pain, HA's and focal weakness.  ED Course: patient CXR checked and found to have PNA; patient also hypoxic at home O2 supplementation. EKG with A. Fib with RVR. Patient cultures taken, started on empiric broad spectrum abx's and Cardizem drip. Oxygen supplementation increase and patient symptoms stabilized. TR[facility] called to admit patient for further evaluation and treatment.   Review of Systems:  All other systems reviewed and apart from HPI, are negative.  Past Medical History:  Diagnosis Date  . Allergy   . Anxiety   . Asthma   . Bronchospasm   . Cerebrovascular accident (CVA) ([place]) 08/28/2015  . Chronic lymphocytic leukemia ([place]) 2004  . COPD (chronic obstructive pulmonary disease) ([place])   . Depression   . Emphysema of lung ([place])   . Frontal sinusitis November 2012  . H/O viral encephalitis   . Headache(784.0)   . Heart murmur   . Mitral prolapse 11/24/2015  .  Mitral regurgitation 11/24/2015  . Olecranon bursitis of left elbow November 2012.   in past  . Pleurisy   . Pneumonia    13 times  . Rhinitis   . Thrombocytopenia ([place])   . Tobacco abuse   . Tricuspid valve regurgitation 11/24/2015    Past Surgical History:  Procedure Laterality Date  . ABDOMINAL HYSTERECTOMY    . COLONOSCOPY    . UPPER GASTROINTESTINAL ENDOSCOPY    . VIDEO BRONCHOSCOPY Bilateral 04/18/2016   Procedure: VIDEO BRONCHOSCOPY WITHOUT FLUORO;  Surgeon: Tanda Rockers, MD;  Location: [facility];  Service: Cardiopulmonary;  Laterality: Bilateral;     reports that she has been smoking Cigarettes and E-cigarettes.  She has a 22.50 pack-year smoking history. She has never used smokeless tobacco. She reports that she does not drink alcohol or use drugs.  Allergies  Allergen Reactions  . Citrus     Burning in stomach   . Doxycycline     Caused pt daily headaches to be more severe   . Penicillins Hives, Nausea And Vomiting and Swelling    Has patient had a PCN reaction causing immediate rash, facial/tongue/throat swelling, SOB or lightheadedness with hypotension: Yes Has patient had a PCN reaction causing severe rash involving mucus membranes or skin necrosis: unknown Has patient had a PCN reaction that required hospitalization No Has patient had a PCN reaction occurring within the last 10 years: No If all of the above answers are "NO", then may proceed with Cephalosporin use.     Family History  Problem Relation Age of Onset  . Aneurysm Mother   . Liver disease Father   .  Congestive Heart Failure Brother   . Heart disease Brother   . Colon cancer Neg Hx   . Esophageal cancer Neg Hx   . Pancreatic cancer Neg Hx   . Rectal cancer Neg Hx   . Stomach cancer Neg Hx      Prior to Admission medications   Medication Sig Start Date End Date Taking? Authorizing Provider  albuterol (PROVENTIL) (2.5 MG/3ML) 0.083% nebulizer solution Take 3 mLs (2.5 mg total) by  nebulization every 4 (four) hours as needed for wheezing or shortness of breath. 06/21/13  Yes Francine Graven, DO  ALPRAZolam Duanne Moron) 0.5 MG tablet Take 0.5 mg by mouth 2 (two) times daily as needed for anxiety or sleep.  12/27/14  Yes Historical Provider, MD  budesonide-formoterol (SYMBICORT) 160-4.5 MCG/ACT inhaler Take 2 puffs first thing in am and then another 2 puffs about 12 hours later. 04/29/16  Yes Tanda Rockers, MD  metoprolol succinate (TOPROL-XL) 25 MG 24 hr tablet Take 25 mg by mouth daily. 04/05/16  Yes Historical Provider, MD  predniSONE (STERAPRED UNI-PAK 21 TAB) 10 MG (21) TBPK tablet Take 4 tabs  daily with food x 4 days, then 3 tabs daily x 4 days, then 2 tabs daily x 4 days, then 1 tab daily x4 days then stop. #40 04/19/16  Yes William S Minor, NP  sertraline (ZOLOFT) 25 MG tablet Take 25 mg by mouth daily. 04/05/16  Yes Historical Provider, MD  temazepam (RESTORIL) 30 MG capsule Take 30 mg by mouth at bedtime. 04/05/16  Yes Historical Provider, MD  VENTOLIN HFA 108 (90 Base) MCG/ACT inhaler Inhale 2 puffs into the lungs every 6 (six) hours as needed for shortness of breath. 04/06/16  Yes Historical Provider, MD    Physical Exam: Vitals:   04/30/16 1500 04/30/16 1709 04/30/16 1730 04/30/16 1830  BP: 124/64 113/62 123/61 108/71  Pulse: 105 105 105 102  Resp: '23 26 26 '$ (!) 31  Temp:      TempSrc:      SpO2: 90% 90% 91% 93%    Constitutional:in mild distress due to resp failure; unable to speak in full sentences and complaining of mild chest discomfort and palpitations. Patient is afebrile currently. Eyes: PERTLA, lids and conjunctivae normal, no icterus ENMT: Mucous membranes dry on exam; no erythema, exudate or lesions in her posterior pharinx. No thrush Neck: normal, supple, no masses, no thyromegaly Respiratory: tachypneic and with mild use of accessory muscles, positive exp wheezing and diffuse rhonchi (R > L); no crackles appreciated. Cardiovascular: irregular irregular,  no rubs, no gallops, soft SEM. No JVD and no LE edema seen. 2+ pedal pulses. No carotid bruits.  Abdomen: No distension, no tenderness, no masses palpated. No hepatosplenomegaly. Bowel sounds normal.  Musculoskeletal: no clubbing / cyanosis. No joint deformity upper and lower extremities. Good ROM, no contractures. Normal muscle tone.  Neurologic: CN 2-12 grossly intact. Sensation intact, DTR normal. Strength 4/5 in all 4 limbs due to poor effort and deconditioning.  Psychiatric: Normal judgment and insight. Alert and oriented x 3. Normal mood.    Labs on Admission: I have personally reviewed following labs and imaging studies  CBC:  Recent Labs Lab 04/30/16 1232  WBC 35.1*  HGB 12.6  HCT 38.7  MCV 95.8  PLT 196   Basic Metabolic Panel:  Recent Labs Lab 04/30/16 1405  NA 139  K 4.1  CL 103  CO2 29  GLUCOSE 94  BUN 15  CREATININE 0.58  CALCIUM 7.3*   GFR: Estimated Creatinine  Clearance: 64.4 mL/min (by C-G formula based on SCr of 0.58 mg/dL).  Urine analysis:    Component Value Date/Time   COLORURINE STRAW (A) 09/24/2015 1946   APPEARANCEUR CLEAR (A) 09/24/2015 1946   LABSPEC 1.010 09/24/2015 1946   PHURINE 5.0 09/24/2015 1946   GLUCOSEU NEGATIVE 09/24/2015 1946   HGBUR 1+ (A) 09/24/2015 1946   BILIRUBINUR NEGATIVE 09/24/2015 1946   KETONESUR NEGATIVE 09/24/2015 1946   PROTEINUR NEGATIVE 09/24/2015 1946   UROBILINOGEN 0.2 01/06/2015 1843   NITRITE NEGATIVE 09/24/2015 1946   LEUKOCYTESUR NEGATIVE 09/24/2015 1946   Radiological Exams on Admission: Dg Chest Portable 1 View  Result Date: 04/30/2016 CLINICAL DATA:  Shortness of breath and chest pain today. Productive cough, nausea and fever. EXAM: PORTABLE CHEST 1 VIEW COMPARISON:  Single-view of the chest 04/18/2016. CT chest 03/27/2016. FINDINGS: The lungs are emphysematous. There is right worse than left basilar airspace disease. Heart size is normal. No pneumothorax or pleural effusion. IMPRESSION: Right worse  left basilar airspace disease has an appearance most consistent with pneumonia. Emphysema. Electronically Signed   By: Inge Rise M.D.   On: 04/30/2016 12:19    EKG:  A fib with RVR appreciated on EKG  Assessment/Plan 1-acute on chronic resp failure with hypoxia: patient with O2 sat in the mid 80's at her usual supplementation; requiring 4-5L to keep her above 89-90%.  -appears to be secondary to HCAP, with coadjuvant of COPD exacerbation and A. Fib with RVR -patient will be admitted to stepdown bed -started on IV antibiotics broad spectrum (for HCAP) as per protocol -initiated on nebulizer treatment, solumedrol and flutter valve -also on cardizem drip for A. Fib control  2-HCAP (healthcare-associated pneumonia) -seen on CXR -will provide oxygen supplementation -broad spectrum antibiotics -nebulizer treatment and flutter valve -will follow sputum, blood, urine cultures, strep pneumo antigen and legionella antigen   3-CLL (chronic lymphocytic leukemia) ([place]) -continue outpatient follow up with oncology service  4-Mass of left lung -status post bronchoscopy and biopsy recently -already with scheduled PETscan as an outpatient   5-Atrial fibrillation with rapid ventricular response ([place]) -as mentioned above will continue cardizem drip -covering with heparin for now; but CHADsVASC score 5 (so will need anticoagulation); will follow cardiology recommendations. -will check 2-D echo -troponin neg  6-depression/anxiety/insomnia -will use PRN xanax -continue restoril and zoloft   Time: 70 minutes  DVT prophylaxis: heparin   Code Status: Full Family Communication: no family at bedside   Disposition Plan: to be determined; unclear pathway currently. Will follow clinical response.  Consults called: (cardiology and PCCM); both services will see patient in am.  Admission status: inpatient, stepdown, LOS > 2 midnights     Barton Dubois MD [facility] Pager [phone]  If 7PM-7AM, please contact night-coverage [email or web address] Password [facility]  04/30/2016, 6:58 PM

## 2016-04-30 NOTE — ED Notes (Signed)
Pharmacy called and asked to verify antibiotic so it can be pulled from pyxis and administered

## 2016-04-30 NOTE — Progress Notes (Signed)
Pharmacy Antibiotic Note  Sheri Ayala is a 66 y.o. female admitted on 04/30/2016 with pneumonia.   She has hx of Lung Ca and COPD, enterobacter PNA.  Recently admitted 04/20/15-->04/21/15 with acute respiratory failure & discharged on Bactrim, steroids.  She returns today with fever, productive cough, & shortness of breath.  CXR + PNA.  Pharmacy has been consulted for Vanc & Azactam dosing. Penicillin and doxycycline allergies noted.  There is no record of patient ever receiving cephalosporins.   Renal function is at patient's baseline.  Estimated CrCl ~ 73m/min.   Plan: Vancomycin 1gm IV x1 now then '500mg'$  IV q12h (goal trough 15-20 mcg/ml) Aztreonam 2gm IV x1 in ED then 1gm IV q8h Check Vancomycin trough at steady state Monitor renal function and cx data      Temp (24hrs), Avg:99.2 F (37.3 C), Min:99.2 F (37.3 C), Max:99.2 F (37.3 C)  No results for input(s): WBC, CREATININE, LATICACIDVEN, VANCOTROUGH, VANCOPEAK, VANCORANDOM, GENTTROUGH, GENTPEAK, GENTRANDOM, TOBRATROUGH, TOBRAPEAK, TOBRARND, AMIKACINPEAK, AMIKACINTROU, AMIKACIN in the last 168 hours.  Estimated Creatinine Clearance: 64.4 mL/min (by C-G formula based on SCr of 0.62 mg/dL).    Allergies  Allergen Reactions  . Citrus     Burning in stomach   . Doxycycline     Caused pt daily headaches to be more severe   . Penicillins Hives, Nausea And Vomiting and Swelling    Has patient had a PCN reaction causing immediate rash, facial/tongue/throat swelling, SOB or lightheadedness with hypotension: Yes Has patient had a PCN reaction causing severe rash involving mucus membranes or skin necrosis: unknown Has patient had a PCN reaction that required hospitalization No Has patient had a PCN reaction occurring within the last 10 years: No If all of the above answers are "NO", then may proceed with Cephalosporin use.     Antimicrobials this admission: 1/15 Vanc >>  1/15 Aztreonam >>   Dose adjustments this  admission:  Microbiology results: 1/15 BCx: ordered  Thank you for allowing pharmacy to be a part of this patient's care.  LBiagio Borg1/15/2018 2:12 PM

## 2016-04-30 NOTE — ED Triage Notes (Signed)
Per EMS pt coming from home with c/o SOB, productive cough with yellow sputum, nausea and fever (103 per EMS). Per EMS pt is in afib with HR 150-190 (hx of same), O2 sats 87%on 2L, b/p 111/64

## 2016-04-30 NOTE — Progress Notes (Signed)
Pt instructed on use of Flutter valve.  Pt demonstrated with good effort and technique.  RT to monitor and assess as needed.  

## 2016-04-30 NOTE — ED Notes (Signed)
Family at bedside, pt observed watching tv, denies pain

## 2016-05-01 ENCOUNTER — Encounter (HOSPITAL_COMMUNITY): Payer: Self-pay | Admitting: Cardiology

## 2016-05-01 ENCOUNTER — Inpatient Hospital Stay (HOSPITAL_COMMUNITY): Payer: Medicare HMO

## 2016-05-01 DIAGNOSIS — I4891 Unspecified atrial fibrillation: Secondary | ICD-10-CM

## 2016-05-01 DIAGNOSIS — J09X1 Influenza due to identified novel influenza A virus with pneumonia: Secondary | ICD-10-CM

## 2016-05-01 DIAGNOSIS — R946 Abnormal results of thyroid function studies: Secondary | ICD-10-CM

## 2016-05-01 DIAGNOSIS — J441 Chronic obstructive pulmonary disease with (acute) exacerbation: Secondary | ICD-10-CM

## 2016-05-01 DIAGNOSIS — Z8673 Personal history of transient ischemic attack (TIA), and cerebral infarction without residual deficits: Secondary | ICD-10-CM

## 2016-05-01 DIAGNOSIS — J449 Chronic obstructive pulmonary disease, unspecified: Secondary | ICD-10-CM

## 2016-05-01 DIAGNOSIS — J9621 Acute and chronic respiratory failure with hypoxia: Secondary | ICD-10-CM

## 2016-05-01 LAB — CBC
HEMATOCRIT: 38.4 % (ref 36.0–46.0)
HEMOGLOBIN: 11.6 g/dL — AB (ref 12.0–15.0)
MCH: 29.5 pg (ref 26.0–34.0)
MCHC: 30.2 g/dL (ref 30.0–36.0)
MCV: 97.7 fL (ref 78.0–100.0)
Platelets: 230 10*3/uL (ref 150–400)
RBC: 3.93 MIL/uL (ref 3.87–5.11)
RDW: 15 % (ref 11.5–15.5)
WBC: 38.1 10*3/uL — AB (ref 4.0–10.5)

## 2016-05-01 LAB — RESPIRATORY PANEL BY PCR
ADENOVIRUS-RVPPCR: NOT DETECTED
Bordetella pertussis: NOT DETECTED
CORONAVIRUS 229E-RVPPCR: NOT DETECTED
CORONAVIRUS HKU1-RVPPCR: NOT DETECTED
CORONAVIRUS NL63-RVPPCR: NOT DETECTED
Chlamydophila pneumoniae: NOT DETECTED
Coronavirus OC43: NOT DETECTED
Influenza A: NOT DETECTED
Influenza B: NOT DETECTED
MYCOPLASMA PNEUMONIAE-RVPPCR: NOT DETECTED
Metapneumovirus: NOT DETECTED
PARAINFLUENZA VIRUS 3-RVPPCR: NOT DETECTED
Parainfluenza Virus 1: NOT DETECTED
Parainfluenza Virus 2: NOT DETECTED
Parainfluenza Virus 4: NOT DETECTED
Respiratory Syncytial Virus: NOT DETECTED
Rhinovirus / Enterovirus: NOT DETECTED

## 2016-05-01 LAB — PROTIME-INR
INR: 1.44
Prothrombin Time: 17.7 seconds — ABNORMAL HIGH (ref 11.4–15.2)

## 2016-05-01 LAB — HEPARIN LEVEL (UNFRACTIONATED)

## 2016-05-01 LAB — BASIC METABOLIC PANEL
ANION GAP: 7 (ref 5–15)
BUN: 13 mg/dL (ref 6–20)
CO2: 27 mmol/L (ref 22–32)
Calcium: 7.4 mg/dL — ABNORMAL LOW (ref 8.9–10.3)
Chloride: 105 mmol/L (ref 101–111)
Creatinine, Ser: 0.51 mg/dL (ref 0.44–1.00)
GFR calc Af Amer: >60 mL/min (ref >60)
Glucose, Bld: 140 mg/dL — ABNORMAL HIGH (ref 65–99)
POTASSIUM: 4.2 mmol/L (ref 3.5–5.1)
SODIUM: 139 mmol/L (ref 135–145)

## 2016-05-01 LAB — ECHOCARDIOGRAM COMPLETE
Height: 70 in
Weight: 2208.13 oz

## 2016-05-01 LAB — STREP PNEUMONIAE URINARY ANTIGEN: STREP PNEUMO URINARY ANTIGEN: NEGATIVE

## 2016-05-01 LAB — HIV ANTIBODY (ROUTINE TESTING W REFLEX): HIV Screen 4th Generation wRfx: NONREACTIVE

## 2016-05-01 MED ORDER — LEVALBUTEROL HCL 0.63 MG/3ML IN NEBU
0.6300 mg | INHALATION_SOLUTION | Freq: Four times a day (QID) | RESPIRATORY_TRACT | Status: DC
Start: 2016-05-01 — End: 2016-05-03
  Administered 2016-05-01 – 2016-05-02 (×7): 0.63 mg via RESPIRATORY_TRACT
  Filled 2016-05-01 (×6): qty 3

## 2016-05-01 MED ORDER — SODIUM CHLORIDE 0.9 % IV BOLUS (SEPSIS)
500.0000 mL | Freq: Once | INTRAVENOUS | Status: AC
  Administered 2016-05-01: 500 mL via INTRAVENOUS

## 2016-05-01 MED ORDER — SODIUM CHLORIDE 0.9 % IV BOLUS (SEPSIS)
250.0000 mL | Freq: Once | INTRAVENOUS | Status: AC
  Administered 2016-05-01: 250 mL via INTRAVENOUS

## 2016-05-01 MED ORDER — BUDESONIDE 0.5 MG/2ML IN SUSP
0.5000 mg | Freq: Two times a day (BID) | RESPIRATORY_TRACT | Status: DC
  Administered 2016-05-01 – 2016-05-07 (×13): 0.5 mg via RESPIRATORY_TRACT
  Filled 2016-05-01 (×13): qty 2

## 2016-05-01 MED ORDER — HEPARIN BOLUS VIA INFUSION
1800.0000 [IU] | Freq: Once | INTRAVENOUS | Status: AC
  Administered 2016-05-01: 1800 [IU] via INTRAVENOUS
  Filled 2016-05-01: qty 1800

## 2016-05-01 MED ORDER — IPRATROPIUM BROMIDE 0.02 % IN SOLN
0.5000 mg | Freq: Four times a day (QID) | RESPIRATORY_TRACT | Status: DC
  Administered 2016-05-01 – 2016-05-02 (×7): 0.5 mg via RESPIRATORY_TRACT
  Filled 2016-05-01 (×7): qty 2.5

## 2016-05-01 MED ORDER — HEPARIN (PORCINE) IN NACL 100-0.45 UNIT/ML-% IJ SOLN
14.0000 [IU]/kg/h | INTRAMUSCULAR | Status: DC
  Administered 2016-05-01: 14 [IU]/kg/h via INTRAVENOUS
  Filled 2016-05-01: qty 250

## 2016-05-01 MED ORDER — DILTIAZEM HCL 30 MG PO TABS
30.0000 mg | ORAL_TABLET | Freq: Four times a day (QID) | ORAL | Status: DC
  Administered 2016-05-01 – 2016-05-02 (×5): 30 mg via ORAL
  Filled 2016-05-01 (×5): qty 1

## 2016-05-01 MED ORDER — HEPARIN (PORCINE) IN NACL 100-0.45 UNIT/ML-% IJ SOLN
1100.0000 [IU]/h | INTRAMUSCULAR | Status: DC
  Administered 2016-05-01: 1100 [IU]/h via INTRAVENOUS
  Filled 2016-05-01: qty 250

## 2016-05-01 NOTE — Progress Notes (Signed)
  Echocardiogram 2D Echocardiogram has been performed.  Darlina Sicilian M 05/01/2016, 1:53 PM
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
## 2016-05-01 NOTE — Progress Notes (Addendum)
BP 84/33 with HR 80-90's afib.  Cardizem gtt stopped per order and on call coverage paged per order.   Orders placed by Md.

## 2016-05-01 NOTE — Consult Note (Signed)
Name: Sheri Ayala MRN: [id number] DOB: 02/13/1951    ADMISSION DATE:  04/30/2016 CONSULTATION DATE:  1/16  REFERRING MD :  Lonny Prude   CHIEF COMPLAINT:  Acute on chronic respiratory failure   BRIEF PATIENT DESCRIPTION:   66 year old female followed by Dr Melvyn Novas. 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 She was discharged to home 1/4. She states she actually began to feel worse pretty much the day after d/c. She reported increased nasal congestion, increased cough, progressive weakness and worsening shortness of breath. Also was having palpitations. This occurred in spite of pred taper and antibiotics which were continued after d/c. In the ER she was noted to be in AF w/ RVR, O2 sats were in the mid-80s, and CXR showed new RLL infiltrate. She was admitted to the medical service in the step-down setting. PCCM was asked to admit.   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  has a past surgical history that includes Abdominal hysterectomy; Colonoscopy; Upper gastrointestinal endoscopy; and Video bronchoscopy (Bilateral,  04/18/2016). Prior to Admission medications   Medication Sig Start Date End Date Taking? Authorizing Provider  albuterol (PROVENTIL) (2.5 MG/3ML) 0.083% nebulizer solution Take 3 mLs (2.5 mg total) by nebulization every 4 (four) hours as needed for wheezing or shortness of breath. 06/21/13  Yes Francine Graven, DO  ALPRAZolam Duanne Moron) 0.5 MG tablet Take 0.5 mg by mouth 2 (two) times daily as needed for anxiety or sleep.  12/27/14  Yes Historical Provider, MD  budesonide-formoterol (SYMBICORT) 160-4.5 MCG/ACT inhaler Take 2 puffs first thing in am and then another 2 puffs about 12 hours later. 04/29/16  Yes Tanda Rockers, MD  metoprolol succinate (TOPROL-XL) 25 MG 24 hr tablet Take 25 mg by mouth daily. 04/05/16  Yes Historical Provider, MD  predniSONE (STERAPRED UNI-PAK 21 TAB) 10 MG (21) TBPK tablet Take 4 tabs  daily with food x 4 days, then 3 tabs daily x 4 days, then 2 tabs daily x 4 days, then 1 tab daily x4 days then stop. #40 04/19/16  Yes William S Minor, NP  sertraline (ZOLOFT) 25 MG tablet Take 25 mg by mouth daily. 04/05/16  Yes Historical Provider, MD  temazepam (RESTORIL) 30 MG capsule Take 30 mg by mouth at bedtime. 04/05/16  Yes Historical Provider, MD  VENTOLIN HFA 108 (90 Base) MCG/ACT inhaler Inhale 2 puffs into the lungs every 6 (six) hours as needed for shortness of breath. 04/06/16  Yes Historical Provider, MD   Allergies  Allergen Reactions  . Citrus     Burning in stomach   . Doxycycline     Caused pt daily headaches to be more severe   . Penicillins Hives, Nausea And Vomiting and Swelling    Has patient had a PCN reaction causing immediate rash, facial/tongue/throat  swelling, SOB or lightheadedness with hypotension: Yes Has patient had a PCN reaction causing severe rash involving mucus membranes or skin necrosis: unknown Has patient had a PCN reaction that required hospitalization No Has patient had a PCN reaction occurring within the last 10 years: No If all of the above  answers are "NO", then may proceed with Cephalosporin use.     FAMILY HISTORY:  family history includes Aneurysm in her mother; Congestive Heart Failure in her brother; Heart disease in her brother; Liver disease in her father. SOCIAL HISTORY:  reports that she has been smoking Cigarettes and E-cigarettes.  She has a 22.50 pack-year smoking history. She has never used smokeless tobacco. She reports that she does not drink alcohol or use drugs.  Review of Systems  Constitutional: Positive for fever and malaise/fatigue.  HENT: Positive for congestion. Negative for nosebleeds, sinus pain and sore throat.   Eyes: Negative.   Respiratory: Positive for cough, sputum production, shortness of breath and wheezing.   Cardiovascular: Negative for chest pain, palpitations and leg swelling.  Gastrointestinal: Negative.   Genitourinary: Negative.   Musculoskeletal: Positive for myalgias. Negative for neck pain.  Skin: Negative.   Neurological: Positive for weakness.  Endo/Heme/Allergies: Negative.   Psychiatric/Behavioral: Negative.     SUBJECTIVE:  Feels a little better  VITAL SIGNS: Temp:  [97.8 F (36.6 C)-99.9 F (37.7 C)] 97.8 F (36.6 C) (01/16 0800) Pulse Rate:  [98-149] 98 (01/15 1919) Resp:  [18-40] 23 (01/16 0900) BP: (67-134)/(26-76) 87/34 (01/16 0900) SpO2:  [86 %-99 %] 96 % (01/16 0900) Weight:  [54.4 kg (120 lb)-62.6 kg (138 lb 0.1 oz)] 62.6 kg (138 lb 0.1 oz) (01/16 0432)  PHYSICAL EXAMINATION: Physical Exam  Constitutional: She is oriented to person, place, and time and well-developed, well-nourished, and in no distress.  HENT:  Head: Normocephalic and atraumatic.  Mouth/Throat: Oropharynx is clear and moist.  Nasal congestion   Eyes: Conjunctivae and EOM are normal. Pupils are equal, round, and reactive to light.  Neck: Trachea normal and normal range of motion. Neck supple. Normal carotid pulses and no JVD present. Carotid bruit is not present. No edema present. No  thyroid mass present.  Cardiovascular: Normal rate, regular rhythm and normal pulses.  Exam reveals no gallop, no S3 and no S4.   No murmur heard. Pulmonary/Chest: Effort normal. No accessory muscle usage. No respiratory distress. She has rales in the left middle field and the left lower field.  Abdominal: Soft. Normal appearance and bowel sounds are normal. She exhibits no distension. There is generalized tenderness.  Musculoskeletal: Normal range of motion.  Neurological: She is alert and oriented to person, place, and time. She has normal strength. GCS score is 15.  Skin: Skin is warm, dry and intact. She is not diaphoretic.     Recent Labs Lab 04/30/16 1405 05/01/16 0328  NA 139 139  K 4.1 4.2  CL 103 105  CO2 29 27  BUN 15 13  CREATININE 0.58 0.51  GLUCOSE 94 140*    Recent Labs Lab 04/30/16 1232 05/01/16 0328  HGB 12.6 11.6*  HCT 38.7 38.4  WBC 35.1* 38.1*  PLT 240 230   ABG    Component Value Date/Time   PHART 7.312 (L) 04/18/2016 1602   PCO2ART 58.8 (H) 04/18/2016 1602   PO2ART 71.4 (L) 04/18/2016 1602   HCO3 28.8 (H) 04/18/2016 1602   O2SAT 93.1 04/18/2016 1602   Dg Chest Portable 1 View  Result Date: 04/30/2016 CLINICAL DATA:  Shortness of  breath and chest pain today. Productive cough, nausea and fever. EXAM: PORTABLE CHEST 1 VIEW COMPARISON:  Single-view of the chest 04/18/2016. CT chest 03/27/2016. FINDINGS: The lungs are emphysematous. There is right worse than left basilar airspace disease. Heart size is normal. No pneumothorax or pleural effusion. IMPRESSION: Right worse left basilar airspace disease has an appearance most consistent with pneumonia. Emphysema. Electronically Signed   By: Inge Rise M.D.   On: 04/30/2016 12:19  new right sided infiltrate   ASSESSMENT / PLAN:  Acute on Chronic Hypoxic & hypercarbic  respiratory failure in setting of HCAP  AECOPD -feeling better.  -can't exclude element of edema Plan Agree w/ HCAP coverage  (Azactam and vanc day #1) Will add respiratory viral scan as well as droplet precautions  cont scheduled BDs (agree w/ xopenex given AF w/ RVR) Slow pred taper Will go back on symbicort at d/c She should NOT smoke e-sigs either   AF w/ RVR CHADsVASC score 5 -rate improved Plan Cont tele Cards following; defer to them re: rate control and anticoagulation   LLL lung mass.  Plan Needs PET scan as out pt (already ordered) Will need repeat attempt at tissue sampling after acute infection resolved. Dr Melvyn Novas can arrange this in out-pt setting  CLL Plan F/u w/ heme/onc  Depression Plan [place] ACNP-BC [place] Pager # [phone] OR # [phone] if no answer  05/01/2016, 11:03 AM    ATTENDING NOTE / ATTESTATION NOTE :   I have discussed the case with the resident/APP  Marni Griffon NP  I agree with the resident/APP's  history, physical examination, assessment, and plans.    I have edited the above note and modified it according to our agreed history, physical examination, assessment and plan.   Briefly, 66 year old female, previous smoker, diagnosed recently with a left lower lobe mass, had bronchoscopy and biopsy on 04/18/2016. She had desaturation during the bronchoscopy which prompted her admission. She was discharged on Bactrim and prednisone taper and oxygen 2 L when necessary. She slowly worsened as far as her dyspnea concerned, associated with cough, generalized weakness, palpitations. She presented to the emergency room. Chest x-ray showed now a right lower lobe pneumonia which was not seen on previous chest x-ray. She was admitted in the stepdown unit for healthcare associated pneumonia as well as acute exacerbation of COPD. She went into rapid A. fib last night and was started on diltiazem drip.  At present, she feels better compared to when she came in. Not in distress. Blood pressure 90-100/60, heart rate 95, respiratory 23, 96%  saturation on 3 L nasal cannula. Temperature maximum was 99.9. Fahrenheit. Speaks in several sentences before he gets tachypneic. No oral thrush. No neck vein distention. No intercostal retraction. Wheezing in both lung fields. Crackles in the right base. No rhonchi. Good S1 and S2. No S3. No rub or gallop. Abdomen was benign. Extremities with negative edema clubbing cyanosis. Skin was warm and dry.  Labs reviewed. Creatinine was 0.51. WBC was 38.1. Her WBC is chronically elevated I guess secondary to her CLL.  Assessment/Plan: 1. Healthcare associated pneumonia, right lower lung. Continue vancomycin and aztreonam for now pending cultures. De-escalate once with cultures. R/O influenza.  2. AECOPD. Start Pulmicort nebulizer twice a day. Continue Atrovent but make it 4 times a day. Continue Xopenex but negative for times a day. Cont IV steroids. On discharge, she should be placed on neb meds or MDIs for COPD.  She was pnly  discharged on alb MDI last admission.  Anticipate will need long pred taper.  3. Left lower lung mass. Patient was scheduled to get a PET CT scan yesterday but she missed it as she was sick. Need to reschedule once discharged. I anticipate, she will need bronchoscopy with EBUS once HCAP and AECOPD are better.  4. Acute on chronic hypoxemia. Titrate oxygen to keep O2 saturation more than 88%. 5. Atrial Fibrillation.  Cardiology following. On heparin drip.    Family : No family at bedside but I extensively discussed with the patient the plan.   Monica Becton, MD 05/01/2016, 11:06 AM [facility] Pager [phone] After 3 pm or if no answer, call [phone]

## 2016-05-01 NOTE — Progress Notes (Signed)
ANTICOAGULATION CONSULT NOTE - Initial Consult  Pharmacy Consult for Heparin Indication: atrial fibrillation  Allergies  Allergen Reactions  . Citrus     Burning in stomach   . Doxycycline     Caused pt daily headaches to be more severe   . Penicillins Hives, Nausea And Vomiting and Swelling    Has patient had a PCN reaction causing immediate rash, facial/tongue/throat swelling, SOB or lightheadedness with hypotension: Yes Has patient had a PCN reaction causing severe rash involving mucus membranes or skin necrosis: unknown Has patient had a PCN reaction that required hospitalization No Has patient had a PCN reaction occurring within the last 10 years: No If all of the above answers are "NO", then may proceed with Cephalosporin use.     Patient Measurements: Height: '5\' 10"'$  (177.8 cm) Weight: 138 lb 0.1 oz (62.6 kg) IBW/kg (Calculated) : 68.5 Heparin Dosing Weight: using total body weight of 62.6 kg  Vital Signs: Temp: 97.8 F (36.6 C) (01/16 0800) Temp Source: Oral (01/16 0800) BP: 87/34 (01/16 0900)  Labs:  Recent Labs  04/30/16 1232 04/30/16 1405 05/01/16 0328  HGB 12.6  --  11.6*  HCT 38.7  --  38.4  PLT 240  --  230  CREATININE  --  0.58 0.51    Estimated Creatinine Clearance: 68.4 mL/min (by C-G formula based on SCr of 0.51 mg/dL).   Medical History: Past Medical History:  Diagnosis Date  . Allergy   . Anxiety   . Asthma   . Atrial fibrillation ([place]) 04/2016  . Bronchospasm   . Cerebrovascular accident (CVA) ([place]) 08/28/2015  . Chronic lymphocytic leukemia ([place]) 2004  . COPD (chronic obstructive pulmonary disease) ([place])   . Depression   . Emphysema of lung ([place])   . Frontal sinusitis November 2012  . H/O viral encephalitis   . Headache(784.0)   . Heart murmur   . Mitral prolapse 11/24/2015  . Mitral regurgitation 11/24/2015  . Olecranon bursitis of left elbow November 2012.   in past  . Pleurisy   . Pneumonia    13 times  . Rhinitis   .  Thrombocytopenia ([place])   . Tobacco abuse   . Tricuspid valve regurgitation 11/24/2015    Assessment: 34 yoF with h/o COPD and lung cancer, recent admission for PNA presents with shortness of breath. Found to be in afib with RVR. CHADS-VASc score 4 (age, gender, h/o CVA). Pharmacy consulted to start IV heparin infusion.  Patient received SQ heparin at ~5am this morning. Hgb, platelets ok Baseline aPTT and PT/INR ordered.  Goal of Therapy:  Heparin level 0.3-0.7 units/ml Monitor platelets by anticoagulation protocol: Yes   Plan:  Start heparin infusion at 900 units/hr.  No bolus due to SQ heparin given this AM. Check heparin level 6 hours after start of infusion. F/u baseline anticoagulation labs. Daily heparin level and CBC while on heparin infusion. F/u plans for long-term anticoagulant.  Hershal Coria 05/01/2016,11:18 AM

## 2016-05-01 NOTE — Progress Notes (Signed)
Initial Nutrition Assessment  DOCUMENTATION CODES:   Severe malnutrition in context of acute illness/injury  INTERVENTION:  - Continue Ensure Enlive po BID, each supplement provides 350 kcal and 20 grams of protein - Continue to encourage PO intakes of meals and supplements. - RD will continue to monitor for additional needs.  NUTRITION DIAGNOSIS:   Inadequate oral intake related to acute illness, nausea, poor appetite as evidenced by per patient/family report.  GOAL:   Patient will meet greater than or equal to 90% of their needs  MONITOR:   PO intake, Supplement acceptance, Weight trends, Labs, I & O's  REASON FOR ASSESSMENT:   Malnutrition Screening Tool  ASSESSMENT:   66 y.o. female with PMH significant for COPD (on chronic oxygen supplementation, CLL, tobacco abuse (quit 2 weeks ago), depression, anxiety, left lung mass and hx of CVA (w/o residual deficit); presented with worsening SOB, subjective fever and palpitations. Patient jsut recently admitted for bronchoscopy and biopsy of her left lung mass; subsequently developing COPD exacerbation and resp failure; patient discharge on bactrim and slow steroid tapering. She finish antibiotics and even doing poorly was waiting for her follow up appointment. about 24-48 hours prior to admission started having subjective fever, palpitations, coughing spells, left side chest discomfort with cough and generalized weakness.   Pt seen for MST. BMI indicates normal weight. No intakes documented since admission. Pt ate ~50% of breakfast today (visualized breakfast tray). Pt states that yesterday she only ate a small cup of macaroni and cheese and the 4 days PTA she was very nauseated with all PO intakes and was only consuming sips of ginger ale. She states that she had biopsy 04/18/16 and that since that time she has had decreased appetite and desire in food. She may go all day without eating d/t eating being unappealing. She was unable to give  RD a sense of a "typical day" as far as eating.   She states prior to 1/3 she had a very good appetite and was eating many high fat foods in an attempt to gain weight. Her max weight was 141 lbs and, she reports she has been losing all weight since 1/3. This indicates 3 lb eight loss (2% body weight) in <2 weeks which is significant for time frame. Physical assessment shows mild fat and mild muscle wasting. Pt meets criteria for severe malnutrition based on weight loss and <50% needed intakes for >/= 5 days.  Ensure Enlive ordered BID and pt reports she enjoys this supplement. She was not drinking it PTA. She denies SOB with PO intakes. Notes indicate pt stopped smoking 2 weeks PTA.  Medications reviewed; 40 mg IV Solu-medrol BID. Labs reviewed; Ca: 7.4 mg/dL.    Diet Order:  Diet Heart Room service appropriate? Yes; Fluid consistency: Thin  Skin:  Reviewed, no issues  Last BM:  PTA (unknown)  Height:   Ht Readings from Last 1 Encounters:  04/30/16 '5\' 10"'$  (1.778 m)    Weight:   Wt Readings from Last 1 Encounters:  05/01/16 138 lb 0.1 oz (62.6 kg)    Ideal Body Weight:  68.18 kg  BMI:  Body mass index is 19.8 kg/m.  Estimated Nutritional Needs:   Kcal:  1565-1750 (25-28 kcal/kg)  Protein:  70-80 grams (~1.1-1.3 grams/kg)  Fluid:  1.6-1.8 L/day  EDUCATION NEEDS:   No education needs identified at this time    Jarome Matin, MS, RD, LDN, CNSC Inpatient Clinical Dietitian Pager # [phone] After hours/weekend pager # [phone]

## 2016-05-01 NOTE — Progress Notes (Signed)
[place] for Heparin Indication: atrial fibrillation  Allergies  Allergen Reactions  . Citrus     Burning in stomach   . Doxycycline     Caused pt daily headaches to be more severe   . Penicillins Hives, Nausea And Vomiting and Swelling    Has patient had a PCN reaction causing immediate rash, facial/tongue/throat swelling, SOB or lightheadedness with hypotension: Yes Has patient had a PCN reaction causing severe rash involving mucus membranes or skin necrosis: unknown Has patient had a PCN reaction that required hospitalization No Has patient had a PCN reaction occurring within the last 10 years: No If all of the above answers are "NO", then may proceed with Cephalosporin use.     Patient Measurements: Height: '5\' 10"'$  (177.8 cm) Weight: 138 lb 0.1 oz (62.6 kg) IBW/kg (Calculated) : 68.5 Heparin Dosing Weight: using total body weight of 62.6 kg  Vital Signs: Temp: 98.5 F (36.9 C) (01/16 1923) Temp Source: Oral (01/16 1923) BP: 94/60 (01/16 1951)  Labs:  Recent Labs  04/30/16 1232 04/30/16 1405 05/01/16 0328 05/01/16 1400 05/01/16 1817  HGB 12.6  --  11.6*  --   --   HCT 38.7  --  38.4  --   --   PLT 240  --  230  --   --   LABPROT  --   --   --  17.7*  --   INR  --   --   --  1.44  --   HEPARINUNFRC  --   --   --   --  <0.10*  CREATININE  --  0.58 0.51  --   --     Estimated Creatinine Clearance: 68.4 mL/min (by C-G formula based on SCr of 0.51 mg/dL).   Medical History: Past Medical History:  Diagnosis Date  . Allergy   . Anxiety   . Asthma   . Atrial fibrillation ([place]) 04/2016  . Bronchospasm   . Cerebrovascular accident (CVA) ([place]) 08/28/2015  . Chronic lymphocytic leukemia ([place]) 2004  . COPD (chronic obstructive pulmonary disease) ([place])   . Depression   . Emphysema of lung ([place])   . Frontal sinusitis November 2012  . H/O viral encephalitis   . Headache(784.0)   . Heart murmur   . Mitral prolapse 11/24/2015   . Mitral regurgitation 11/24/2015  . Olecranon bursitis of left elbow November 2012.   in past  . Pleurisy   . Pneumonia    13 times  . Rhinitis   . Thrombocytopenia ([place])   . Tobacco abuse   . Tricuspid valve regurgitation 11/24/2015    Assessment: 65 yoF with h/o COPD and lung cancer, recent admission for PNA presents with shortness of breath. Found to be in afib with RVR. CHADS-VASc score 4 (age, gender, h/o CVA). Pharmacy consulted to start IV heparin infusion.  Patient received SQ heparin at ~5am this morning. Hgb, platelets ok Baseline aPTT and PT/INR WNL No bleeding or other issues per RN  Goal of Therapy:  Heparin level 0.3-0.7 units/ml Monitor platelets by anticoagulation protocol: Yes   Plan:  Bolus heparin 1800 units Increase heparin drip to 1100 units/hr Check heparin level 6 hours  Daily heparin level and CBC while on heparin infusion. F/u plans for long-term anticoagulant.  Dolly Rias RPh 05/01/2016, 8:31 PM Pager [phone]

## 2016-05-01 NOTE — Progress Notes (Signed)
PROGRESS NOTE    Sheri Ayala  EUM:[id number] DOB: 07-08-1950 DOA: 04/30/2016 PCP: Alvester Chou, NP   Brief Narrative: Sheri Ayala is a 66 y.o. with a history of COPD, CLL, tobacco abuse, depression, anxiety, left lung mass, history of CVA. She presented with dyspnea, subjective fever, palpitations. She is found to have runs a infection and initially presented in A. fib with RVR.   Assessment & Plan:   Principal Problem:   Acute on chronic respiratory failure with hypoxia (HCC) Active Problems:   CLL (chronic lymphocytic leukemia) (HCC)   COPD GOLD III    Mass of left lung   HCAP (healthcare-associated pneumonia)   Atrial fibrillation with rapid ventricular response (HCC)   Abnormal TSH   History of CVA (cerebrovascular accident)   Acute on chronic respiratory failure with hypoxia Seems that this could be possibly secondary to influenza infection rather than HCAP. -Wean O2 to home  Influenza A infection Start Tamiflu 75 mg twice a day 5 days  ? HCAP Seems likely that this could be influenza rather than bacterial infection. Strep pneumonia urine antigen negative. MRSA screen negative. Cultures taken. -Discontinue vancomycin -Continue Zosyn until culture data is resulted (sputum and blood)  Chronic lymphocytic leukemia Stable. Outpatient follow-up with oncology next  Mass of left lung Status post bronchoscopy and biopsy with pulmonology. Outpatient follow-up  Atrial fibrillation with rapid ventricular response Patient motion on Cardizem drip, however this was discontinued secondary to hypotension. She is now rate controlled. Cardiology on board. CHA2DS2-VASc Score is 5. -Cardiology recommendations -2-D echocardiogram pending  Depression Anxiety Insomnia -Continue when necessary Xanax -Continue Restoril -Continue Zoloft   DVT prophylaxis: Heparin infusion  Code Status: Full code  Family Communication: None at bedside  Disposition Plan: Anticipate discharge home  in 2-3 days   Consultants:   Cardiology  Pulmonology  Procedures:  None  Antimicrobials:  Vancomycin (1/15>>1/16)  Zosyn (1/15>>   Subjective: Patient reports feeling better sleep. She has dyspnea.  Objective: Vitals:   05/01/16 0836 05/01/16 0900 05/01/16 1200 05/01/16 1250  BP:  (!) 87/34    Pulse:      Resp:  (!) 23    Temp:   98.4 F (36.9 C)   TempSrc:   Oral   SpO2: 93% 96%  95%  Weight:      Height:        Intake/Output Summary (Last 24 hours) at 05/01/16 1307 Last data filed at 05/01/16 0900  Gross per 24 hour  Intake          1272.43 ml  Output              400 ml  Net           872.43 ml   Filed Weights   04/30/16 1919 04/30/16 2020 05/01/16 0432  Weight: 54.4 kg (120 lb) 61.3 kg (135 lb 2.3 oz) 62.6 kg (138 lb 0.1 oz)    Examination:  General exam: Appears calm and comfortable Respiratory system: Wheezing bilaterally. Respiratory effort normal. Cardiovascular system: S1 & S2 heard, irregular rhythm, normal rate. No murmurs, rubs, gallops or clicks. Gastrointestinal system: Abdomen is nondistended, soft and nontender. No organomegaly or masses felt. Normal bowel sounds heard. Central nervous system: Alert and oriented. No focal neurological deficits. Extremities: No edema. No calf tenderness Skin: No cyanosis. No rashes Psychiatry: Judgement and insight appear normal. Mood & affect flat.     Data Reviewed: I have personally reviewed following labs and imaging studies  CBC:  Recent Labs Lab 04/30/16 1232 05/01/16 0328  WBC 35.1* 38.1*  HGB 12.6 11.6*  HCT 38.7 38.4  MCV 95.8 97.7  PLT 240 098   Basic Metabolic Panel:  Recent Labs Lab 04/30/16 1405 04/30/16 2138 05/01/16 0328  NA 139  --  139  K 4.1  --  4.2  CL 103  --  105  CO2 29  --  27  GLUCOSE 94  --  140*  BUN 15  --  13  CREATININE 0.58  --  0.51  CALCIUM 7.3*  --  7.4*  MG  --  1.7  --    GFR: Estimated Creatinine Clearance: 68.4 mL/min (by C-G formula based  on SCr of 0.51 mg/dL). Liver Function Tests: No results for input(s): AST, ALT, ALKPHOS, BILITOT, PROT, ALBUMIN in the last 168 hours. No results for input(s): LIPASE, AMYLASE in the last 168 hours. No results for input(s): AMMONIA in the last 168 hours. Coagulation Profile: No results for input(s): INR, PROTIME in the last 168 hours. Cardiac Enzymes: No results for input(s): CKTOTAL, CKMB, CKMBINDEX, TROPONINI in the last 168 hours. BNP (last 3 results) No results for input(s): PROBNP in the last 8760 hours. HbA1C: No results for input(s): HGBA1C in the last 72 hours. CBG: No results for input(s): GLUCAP in the last 168 hours. Lipid Profile: No results for input(s): CHOL, HDL, LDLCALC, TRIG, CHOLHDL, LDLDIRECT in the last 72 hours. Thyroid Function Tests:  Recent Labs  04/30/16 2138  TSH 0.310*   Anemia Panel: No results for input(s): VITAMINB12, FOLATE, FERRITIN, TIBC, IRON, RETICCTPCT in the last 72 hours. Sepsis Labs:  Recent Labs Lab 04/30/16 1501  LATICACIDVEN 0.83    Recent Results (from the past 240 hour(s))  Culture, sputum-assessment     Status: None   Collection Time: 04/30/16  8:41 PM  Result Value Ref Range Status   Specimen Description EXPECTORATED SPUTUM  Final   Special Requests NONE  Final   Sputum evaluation THIS SPECIMEN IS ACCEPTABLE FOR SPUTUM CULTURE  Final   Report Status 04/30/2016 FINAL  Final  Culture, respiratory (NON-Expectorated)     Status: None (Preliminary result)   Collection Time: 04/30/16  8:41 PM  Result Value Ref Range Status   Specimen Description EXPECTORATED SPUTUM  Final   Special Requests NONE Reflexed from [id number]  Final   Gram Stain   Final    FEW WBC PRESENT, PREDOMINANTLY PMN RARE SQUAMOUS EPITHELIAL CELLS PRESENT FEW GRAM NEGATIVE COCCOBACILLI RARE GRAM POSITIVE COCCI IN PAIRS Performed at [facility]    Culture PENDING  Incomplete   Report Status PENDING  Incomplete  MRSA PCR Screening     Status: None    Collection Time: 04/30/16  8:43 PM  Result Value Ref Range Status   MRSA by PCR NEGATIVE NEGATIVE Final    Comment:        The GeneXpert MRSA Assay (FDA approved for NASAL specimens only), is one component of a comprehensive MRSA colonization surveillance program. It is not intended to diagnose MRSA infection nor to guide or monitor treatment for MRSA infections.          Radiology Studies: Dg Chest Portable 1 View  Result Date: 04/30/2016 CLINICAL DATA:  Shortness of breath and chest pain today. Productive cough, nausea and fever. EXAM: PORTABLE CHEST 1 VIEW COMPARISON:  Single-view of the chest 04/18/2016. CT chest 03/27/2016. FINDINGS: The lungs are emphysematous. There is right worse than left basilar airspace disease. Heart size is normal. No pneumothorax  or pleural effusion. IMPRESSION: Right worse left basilar airspace disease has an appearance most consistent with pneumonia. Emphysema. Electronically Signed   By: Inge Rise M.D.   On: 04/30/2016 12:19        Scheduled Meds: . aztreonam  1 g Intravenous Q8H  . budesonide (PULMICORT) nebulizer solution  0.5 mg Nebulization BID  . diltiazem  30 mg Oral Q6H  . feeding supplement (ENSURE ENLIVE)  237 mL Oral BID BM  . ipratropium  0.5 mg Nebulization QID  . levalbuterol  0.63 mg Nebulization QID  . mouth rinse  15 mL Mouth Rinse BID  . methylPREDNISolone (SOLU-MEDROL) injection  40 mg Intravenous Q12H  . sertraline  25 mg Oral Daily  . sodium chloride  500 mL Intravenous Once  . sodium chloride flush  3 mL Intravenous Q12H  . temazepam  15 mg Oral QHS  . vancomycin  500 mg Intravenous Q12H   Continuous Infusions: . heparin 14 Units/kg/hr (05/01/16 1140)     LOS: 1 day     Cordelia Poche [facility] 05/01/2016, 1:07 PM Pager: [phone]  If 7PM-7AM, please contact night-coverage [email or web address] Password [email or web address] 05/01/2016, 1:07 PM

## 2016-05-01 NOTE — Progress Notes (Signed)
BP 96/48 after 255m bolus and with cardizem gtt still off.  HR 89.  Paged on call coverage to notify.

## 2016-05-01 NOTE — Progress Notes (Signed)
PT Cancellation Note  Patient Details Name: Sheri Ayala MRN: [id number] DOB: [birth year]   Cancelled Treatment:    Reason Eval/Treat Not Completed: Medical issues which prohibited therapy (low BP)   [facility] 05/01/2016, 8:34 AM

## 2016-05-02 DIAGNOSIS — C9110 Chronic lymphocytic leukemia of B-cell type not having achieved remission: Secondary | ICD-10-CM

## 2016-05-02 DIAGNOSIS — R918 Other nonspecific abnormal finding of lung field: Secondary | ICD-10-CM

## 2016-05-02 LAB — BASIC METABOLIC PANEL
ANION GAP: 4 — AB (ref 5–15)
BUN: 20 mg/dL (ref 6–20)
CALCIUM: 7.9 mg/dL — AB (ref 8.9–10.3)
CO2: 29 mmol/L (ref 22–32)
Chloride: 104 mmol/L (ref 101–111)
Creatinine, Ser: 0.32 mg/dL — ABNORMAL LOW (ref 0.44–1.00)
GFR calc Af Amer: >60 mL/min (ref >60)
GFR calc non Af Amer: >60 mL/min (ref >60)
GLUCOSE: 140 mg/dL — AB (ref 65–99)
POTASSIUM: 4.7 mmol/L (ref 3.5–5.1)
Sodium: 137 mmol/L (ref 135–145)

## 2016-05-02 LAB — URINE CULTURE: CULTURE: NO GROWTH

## 2016-05-02 LAB — HEPARIN LEVEL (UNFRACTIONATED)
HEPARIN UNFRACTIONATED: 0.32 [IU]/mL (ref 0.30–0.70)
Heparin Unfractionated: 0.14 IU/mL — ABNORMAL LOW (ref 0.30–0.70)
Heparin Unfractionated: 0.22 IU/mL — ABNORMAL LOW (ref 0.30–0.70)

## 2016-05-02 LAB — CBC
HEMATOCRIT: 35.5 % — AB (ref 36.0–46.0)
HEMOGLOBIN: 10.7 g/dL — AB (ref 12.0–15.0)
MCH: 30.1 pg (ref 26.0–34.0)
MCHC: 30.1 g/dL (ref 30.0–36.0)
MCV: 99.7 fL (ref 78.0–100.0)
Platelets: 191 10*3/uL (ref 150–400)
RBC: 3.56 MIL/uL — ABNORMAL LOW (ref 3.87–5.11)
RDW: 15.1 % (ref 11.5–15.5)
WBC: 25.1 10*3/uL — ABNORMAL HIGH (ref 4.0–10.5)

## 2016-05-02 MED ORDER — HEPARIN (PORCINE) IN NACL 100-0.45 UNIT/ML-% IJ SOLN
1300.0000 [IU]/h | INTRAMUSCULAR | Status: DC
  Administered 2016-05-02 (×2): 1300 [IU]/h via INTRAVENOUS
  Filled 2016-05-02 (×2): qty 250

## 2016-05-02 MED ORDER — DEXTROSE 5 % IV SOLN
1.0000 g | Freq: Three times a day (TID) | INTRAVENOUS | Status: DC
  Administered 2016-05-02 – 2016-05-03 (×3): 1 g via INTRAVENOUS
  Filled 2016-05-02 (×4): qty 1

## 2016-05-02 MED ORDER — HEPARIN (PORCINE) IN NACL 100-0.45 UNIT/ML-% IJ SOLN
1500.0000 [IU]/h | INTRAMUSCULAR | Status: DC
  Administered 2016-05-03: 1500 [IU]/h via INTRAVENOUS
  Filled 2016-05-02 (×3): qty 250

## 2016-05-02 MED ORDER — DILTIAZEM HCL ER COATED BEADS 120 MG PO CP24
120.0000 mg | ORAL_CAPSULE | Freq: Every day | ORAL | Status: DC
  Administered 2016-05-02 – 2016-05-07 (×6): 120 mg via ORAL
  Filled 2016-05-02 (×6): qty 1

## 2016-05-02 MED ORDER — ACETAMINOPHEN 325 MG PO TABS
650.0000 mg | ORAL_TABLET | Freq: Four times a day (QID) | ORAL | Status: DC | PRN
  Administered 2016-05-02 – 2016-05-07 (×6): 650 mg via ORAL
  Filled 2016-05-02 (×6): qty 2

## 2016-05-02 NOTE — Progress Notes (Signed)
[place] for Heparin Indication: atrial fibrillation  Allergies  Allergen Reactions  . Citrus     Burning in stomach   . Doxycycline     Caused pt daily headaches to be more severe   . Penicillins Hives, Nausea And Vomiting and Swelling    Patient reports tolerating ampicillin.  Has patient had a PCN reaction causing immediate rash, facial/tongue/throat swelling, SOB or lightheadedness with hypotension: Yes Has patient had a PCN reaction causing severe rash involving mucus membranes or skin necrosis: unknown Has patient had a PCN reaction that required hospitalization No Has patient had a PCN reaction occurring within the last 10 years: No If all of the above answers are "NO", then may proceed with Cephalosporin use.     Patient Measurements: Height: '5\' 10"'$  (177.8 cm) Weight: 143 lb 15.4 oz (65.3 kg) IBW/kg (Calculated) : 68.5 Heparin Dosing Weight: using total body weight of 62.6 kg  Vital Signs: Temp: 97.7 F (36.5 C) (01/17 0800) Temp Source: Oral (01/17 0800) BP: 103/50 (01/17 1136)  Labs:  Recent Labs  04/30/16 1232 04/30/16 1405 05/01/16 0328 05/01/16 1400 05/01/16 1817 05/02/16 0332 05/02/16 0340 05/02/16 1113  HGB 12.6  --  11.6*  --   --  10.7*  --   --   HCT 38.7  --  38.4  --   --  35.5*  --   --   PLT 240  --  230  --   --  191  --   --   LABPROT  --   --   --  17.7*  --   --   --   --   INR  --   --   --  1.44  --   --   --   --   HEPARINUNFRC  --   --   --   --  <0.10*  --  0.14* 0.22*  CREATININE  --  0.58 0.51  --   --  0.32*  --   --     Estimated Creatinine Clearance: 71.3 mL/min (by C-G formula based on SCr of 0.32 mg/dL (L)).   Medical History: Past Medical History:  Diagnosis Date  . Allergy   . Anxiety   . Asthma   . Atrial fibrillation ([place]) 04/2016  . Bronchospasm   . Cerebrovascular accident (CVA) ([place]) 08/28/2015  . Chronic lymphocytic leukemia ([place]) 2004  . COPD (chronic obstructive  pulmonary disease) ([place])   . Depression   . Emphysema of lung ([place])   . Frontal sinusitis November 2012  . H/O viral encephalitis   . Headache(784.0)   . Heart murmur   . Mitral prolapse 11/24/2015  . Mitral regurgitation 11/24/2015  . Olecranon bursitis of left elbow November 2012.   in past  . Pleurisy   . Pneumonia    13 times  . Rhinitis   . Thrombocytopenia ([place])   . Tobacco abuse   . Tricuspid valve regurgitation 11/24/2015    Assessment: 25 yoF with h/o COPD and lung cancer, recent admission for PNA presents with shortness of breath. Found to be in afib with RVR. CHADS-VASc score 4 (age, gender, h/o CVA). Pharmacy consulted to start IV heparin infusion.  05/02/16 Heparin level = 0.22, improved but still remains subtherapeutic with heparin infusing @ 1300 units/hr No bleeding noted H/H and PLTC slightly lower this AM  Goal of Therapy:  Heparin level 0.3-0.7 units/ml Monitor platelets by anticoagulation protocol: Yes   Plan:  Increase heparin drip to 1500 units/hr Check heparin level 6 hours  Daily heparin level and CBC while on heparin infusion. F/u plans for long-term anticoagulant.  Hershal Coria, PharmD, BCPS Pager: [phone] 05/02/2016 12:33 PM

## 2016-05-02 NOTE — Progress Notes (Signed)
[place] for Heparin Indication: atrial fibrillation  Allergies  Allergen Reactions  . Citrus     Burning in stomach   . Doxycycline     Caused pt daily headaches to be more severe   . Penicillins Hives, Nausea And Vomiting and Swelling    Has patient had a PCN reaction causing immediate rash, facial/tongue/throat swelling, SOB or lightheadedness with hypotension: Yes Has patient had a PCN reaction causing severe rash involving mucus membranes or skin necrosis: unknown Has patient had a PCN reaction that required hospitalization No Has patient had a PCN reaction occurring within the last 10 years: No If all of the above answers are "NO", then may proceed with Cephalosporin use.     Patient Measurements: Height: '5\' 10"'$  (177.8 cm) Weight: 138 lb 0.1 oz (62.6 kg) IBW/kg (Calculated) : 68.5 Heparin Dosing Weight: using total body weight of 62.6 kg  Vital Signs: Temp: 97.4 F (36.3 C) (01/17 0341) Temp Source: Oral (01/17 0341) BP: 107/47 (01/17 0338)  Labs:  Recent Labs  04/30/16 1232 04/30/16 1405 05/01/16 0328 05/01/16 1400 05/01/16 1817 05/02/16 0332 05/02/16 0340  HGB 12.6  --  11.6*  --   --  10.7*  --   HCT 38.7  --  38.4  --   --  35.5*  --   PLT 240  --  230  --   --  191  --   LABPROT  --   --   --  17.7*  --   --   --   INR  --   --   --  1.44  --   --   --   HEPARINUNFRC  --   --   --   --  <0.10*  --  0.14*  CREATININE  --  0.58 0.51  --   --  0.32*  --     Estimated Creatinine Clearance: 68.4 mL/min (by C-G formula based on SCr of 0.32 mg/dL (L)).   Medical History: Past Medical History:  Diagnosis Date  . Allergy   . Anxiety   . Asthma   . Atrial fibrillation ([place]) 04/2016  . Bronchospasm   . Cerebrovascular accident (CVA) ([place]) 08/28/2015  . Chronic lymphocytic leukemia ([place]) 2004  . COPD (chronic obstructive pulmonary disease) ([place])   . Depression   . Emphysema of lung ([place])   . Frontal sinusitis  November 2012  . H/O viral encephalitis   . Headache(784.0)   . Heart murmur   . Mitral prolapse 11/24/2015  . Mitral regurgitation 11/24/2015  . Olecranon bursitis of left elbow November 2012.   in past  . Pleurisy   . Pneumonia    13 times  . Rhinitis   . Thrombocytopenia ([place])   . Tobacco abuse   . Tricuspid valve regurgitation 11/24/2015    Assessment: 57 yoF with h/o COPD and lung cancer, recent admission for PNA presents with shortness of breath. Found to be in afib with RVR. CHADS-VASc score 4 (age, gender, h/o CVA). Pharmacy consulted to start IV heparin infusion.  05/02/16 Heparin level = 0.14 with heparin infusing @ 1100 units/hr No bleeding noted H/H and PLTC slightly lower this AM  Goal of Therapy:  Heparin level 0.3-0.7 units/ml Monitor platelets by anticoagulation protocol: Yes   Plan:  Increase heparin drip to 1300 units/hr Check heparin level 6 hours  Daily heparin level and CBC while on heparin infusion. F/u plans for long-term anticoagulant.  Leone Haven, PharmD 05/02/2016,  4:42 AM

## 2016-05-02 NOTE — Progress Notes (Addendum)
Patient Name: Sheri Ayala Date of Encounter: 05/02/2016  Primary Cardiologist: Dr [facility] Problem List     Principal Problem:   Acute on chronic respiratory failure with hypoxia [facility]) Active Problems:   CLL (chronic lymphocytic leukemia) (HCC)   COPD GOLD III    Mass of left lung   HCAP (healthcare-associated pneumonia)   Atrial fibrillation with rapid ventricular response (HCC)   Abnormal TSH   History of CVA (cerebrovascular accident)     Subjective   Dyspnea improving; chest pain with cough  Inpatient Medications    Scheduled Meds: . budesonide (PULMICORT) nebulizer solution  0.5 mg Nebulization BID  . ceFEPime (MAXIPIME) IV  1 g Intravenous Q8H  . diltiazem  30 mg Oral Q6H  . feeding supplement (ENSURE ENLIVE)  237 mL Oral BID BM  . ipratropium  0.5 mg Nebulization QID  . levalbuterol  0.63 mg Nebulization QID  . mouth rinse  15 mL Mouth Rinse BID  . methylPREDNISolone (SOLU-MEDROL) injection  40 mg Intravenous Q12H  . sertraline  25 mg Oral Daily  . sodium chloride flush  3 mL Intravenous Q12H  . temazepam  15 mg Oral QHS   Continuous Infusions: . heparin     PRN Meds: sodium chloride, acetaminophen, ALPRAZolam, sodium chloride flush   Vital Signs    Vitals:   05/02/16 0800 05/02/16 0847 05/02/16 0900 05/02/16 1136  BP:  (!) 93/58 (!) 93/58 (!) 103/50  Pulse:      Resp:  (!) '22 20 20  '$ Temp: 97.7 F (36.5 C)     TempSrc: Oral     SpO2:  97% 91% 94%  Weight:      Height:        Intake/Output Summary (Last 24 hours) at 05/02/16 1242 Last data filed at 05/02/16 1200  Gross per 24 hour  Intake          1006.12 ml  Output              350 ml  Net           656.12 ml   Filed Weights   04/30/16 2020 05/01/16 0432 05/02/16 0500  Weight: 135 lb 2.3 oz (61.3 kg) 138 lb 0.1 oz (62.6 kg) 143 lb 15.4 oz (65.3 kg)    Physical Exam   GEN: Well nourished, chronically ill appearing in no acute distress.  HEENT: Grossly normal.  Neck:  Supple Cardiac: irregular; no pedal edema Respiratory:  Diminished BS throughout GI: Soft, nontender, nondistended MS: no deformity or atrophy. Skin: warm and dry, no rash. Neuro:  Strength and sensation are intact. Psych: AAOx3.  Normal affect.  Labs    CBC  Recent Labs  05/01/16 0328 05/02/16 0332  WBC 38.1* 25.1*  HGB 11.6* 10.7*  HCT 38.4 35.5*  MCV 97.7 99.7  PLT 230 858   Basic Metabolic Panel  Recent Labs  04/30/16 2138 05/01/16 0328 05/02/16 0332  NA  --  139 137  K  --  4.2 4.7  CL  --  105 104  CO2  --  27 29  GLUCOSE  --  140* 140*  BUN  --  13 20  CREATININE  --  0.51 0.32*  CALCIUM  --  7.4* 7.9*  MG 1.7  --   --    Thyroid Function Tests  Recent Labs  04/30/16 2138  TSH 0.310*    Telemetry    Atrial fibrillation rate controlled- Personally Reviewed  Patient Profile  66 year old female with past medical history of CVA, COPD and lung mass with new onset atrial fibrillation. Patient presented with pneumonia. She was noted to be in atrial fibrillation. TSH 0.310. Echocardiogram shows normal LV function, mild mitral regurgitation and moderate tricuspid regurgitation.    Assessment & Plan    1. Newly diagnosed atrial fibrillation-duration is unclear. Echo shows normal LV function. TSH is low. Await free T4. Chadsvasc 4. Continue heparin. When it is clear she will not require further procedures would add apixaban. Rate controlled; change cardizem to CD.  2 lung mass-further evaluation per pulmonary.  3 COPD-continue bronchodilators.  4 pneumonia-continue antibiotics. Per primary care.  1. Diagnosed atrial fibrillation-duration is unclear. Check echocardiogram. TSH is low. Would check free T4. Chadsvasc 4. Continue heparin. When it is clear she will not require further procedures would add apixaban. Patient developed hypotension with IV Cardizem. Will try 30 mg every 6.  2 lung mass-further evaluation per pulmonary.  3  COPD-continue bronchodilators.  4 pneumonia-continue antibiotics.  Signed, Kirk Ruths, MD  05/02/2016, 12:42 PM

## 2016-05-02 NOTE — Progress Notes (Signed)
Pharmacy: Re- heparin  Patient's a 66 y.o F on heparin for new onset afib.  Heparin level now back therapeutic at 0.32 after rate increased to 1500 units/hr.  No bleeding documented.  Plan: - continue heparin drip at 1500 units/hr - f/u with AM labs and adjust if needed  Dia Sitter, PharmD, BCPS 05/02/2016 7:46 PM

## 2016-05-02 NOTE — Progress Notes (Signed)
PROGRESS NOTE  Sheri Ayala HYI:[id number] DOB: 08/19/1950 DOA: 04/30/2016 PCP: Alvester Chou, NP  Brief History:   66 y.o. with a history of COPD, CLL, tobacco abuse, depression, anxiety, left lung mass, history of CVA. She presented with dyspnea, subjective fever, palpitations. She is found to have runs a infection and initially presented in A. fib with RVR.  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 She was discharged to home 1/5.  She states she actually began to feel worse pretty much the day after d/c. She reported increased nasal congestion, increased cough, progressive weakness and worsening shortness of breath. Also was having palpitations. This occurred in spite of pred taper and antibiotics which were continued after d/c. In the ER she was noted to be in AF w/ RVR, O2 sats were in the mid-80s, and CXR showed new RLL infiltrate. She was admitted to the medical service in the step-down setting  Assessment/Plan: Acute on chronic respiratory failure with hypoxia -Secondary to HCAP in setting of AECOPD -Wean O2 to home--remains on 3L -Normally on 2 L nasal cannula at home -appreciate Pulm consult -continue xopenex, atrovent -continue solumedrol IV-->plan slow taper  HCAP -D/C aztreonam -Discontinue vancomycin -start cefepime (pt states she took ampicillin in past without problems)--question PCN allergy  Chronic lymphocytic leukemia Stable. Outpatient follow-up with oncology   Mass of left lung -Status post bronchoscopy and biopsy with pulmonology.  -Outpatient follow-up -need outpt PET scan -Will need repeat attempt at tissue sampling after acute infection resolved. Dr Melvyn Novas can arrange this in out-pt setting per pulmonary  Atrial fibrillation with rapid ventricular response -  CHA2DS2-VASc Scoreis 4. -Appreciate Cardiology recommendations -continue diltiazem 30 mg po q 6 hrs -2-D echocardiogram--EF 60-65%, moderate TR, no WMA, PASP 37 -TSH--0.310 -check free T4 -continue IV heparin for now-->plan home with apixaban if no further interventions during this admit  Depression Anxiety Insomnia -Continue when necessary Xanax -Continue Restoril -Continue Zoloft   Disposition Plan:   Home in 2-3 days --transfer to tele Family Communication:   No Family at bedside--Total time spent 35 minutes.  Greater than 50% spent face to face counseling and coordinating care.   Consultants:   Cardiology, pulmonary  Code Status:  FULL  DVT Prophylaxis:  IV Heparin    Procedures: As Listed in Progress Note Above  Antibiotics: Cefepime 1/17>>> Aztreonam 1/15>>1/17 Vanc 1/15>>1/16     Subjective: Overall, patient is breathing better. She has some dyspnea on exertion. She has a productive yellow cough without hemoptysis. Denies any nausea, vomiting, diarrhea, belly pain, dysuria, hematuria. Denies any headaches.  Objective: Vitals:   05/02/16 0338 05/02/16 0341 05/02/16 0400 05/02/16 0500  BP: (!) 107/47     Pulse:      Resp: (!) 23  20   Temp:  97.4 F (36.3 C)    TempSrc:  Oral    SpO2: 94%  97%   Weight:    65.3 kg (143 lb 15.4 oz)  Height:        Intake/Output Summary (Last 24 hours) at 05/02/16 0755 Last data filed at 05/02/16 0600  Gross per 24 hour  Intake           898.12 ml  Output  350 ml  Net           548.12 ml   Weight change: 10.9 kg (23 lb 15.4 oz) Exam:   General:  Pt is alert, follows commands appropriately, not in acute distress  HEENT: No icterus, No thrush, No neck mass, [place]/AT  Cardiovascular: RRR, S1/S2, no rubs, no gallops  Respiratory: Bibasilar crackles. Diminished breath sounds left base.  Abdomen: Soft/+BS, non tender, non distended, no guarding  Extremities: No edema, No lymphangitis, No petechiae,  No rashes, no synovitis   Data Reviewed: I have personally reviewed following labs and imaging studies Basic Metabolic Panel:  Recent Labs Lab 04/30/16 1405 04/30/16 2138 05/01/16 0328 05/02/16 0332  NA 139  --  139 137  K 4.1  --  4.2 4.7  CL 103  --  105 104  CO2 29  --  27 29  GLUCOSE 94  --  140* 140*  BUN 15  --  13 20  CREATININE 0.58  --  0.51 0.32*  CALCIUM 7.3*  --  7.4* 7.9*  MG  --  1.7  --   --    Liver Function Tests: No results for input(s): AST, ALT, ALKPHOS, BILITOT, PROT, ALBUMIN in the last 168 hours. No results for input(s): LIPASE, AMYLASE in the last 168 hours. No results for input(s): AMMONIA in the last 168 hours. Coagulation Profile:  Recent Labs Lab 05/01/16 1400  INR 1.44   CBC:  Recent Labs Lab 04/30/16 1232 05/01/16 0328 05/02/16 0332  WBC 35.1* 38.1* 25.1*  HGB 12.6 11.6* 10.7*  HCT 38.7 38.4 35.5*  MCV 95.8 97.7 99.7  PLT 240 230 191   Cardiac Enzymes: No results for input(s): CKTOTAL, CKMB, CKMBINDEX, TROPONINI in the last 168 hours. BNP: Invalid input(s): POCBNP CBG: No results for input(s): GLUCAP in the last 168 hours. HbA1C: No results for input(s): HGBA1C in the last 72 hours. Urine analysis:    Component Value Date/Time   COLORURINE STRAW (A) 09/24/2015 1946   APPEARANCEUR CLEAR (A) 09/24/2015 1946   LABSPEC 1.010 09/24/2015 1946   PHURINE 5.0 09/24/2015 1946   GLUCOSEU NEGATIVE 09/24/2015 1946   HGBUR 1+ (A) 09/24/2015 1946   BILIRUBINUR NEGATIVE 09/24/2015 1946   KETONESUR NEGATIVE 09/24/2015 1946   PROTEINUR NEGATIVE 09/24/2015 1946   UROBILINOGEN 0.2 01/06/2015 1843   NITRITE NEGATIVE 09/24/2015 1946   LEUKOCYTESUR NEGATIVE 09/24/2015 1946   Sepsis Labs: '@LABRCNTIP'$ (procalcitonin:4,lacticidven:4) ) Recent Results (from the past 240 hour(s))  Blood culture (routine x 2)     Status: None (Preliminary result)   Collection Time: 04/30/16 12:35 PM  Result Value Ref Range Status   Specimen Description  BLOOD BLOOD RIGHT FOREARM  Final   Special Requests BOTTLES DRAWN AEROBIC AND ANAEROBIC 5 CC  Final   Culture   Final    NO GROWTH < 24 HOURS Performed at [facility], [place] [address], [place], [place] [address]    Report Status PENDING  Incomplete  Blood culture (routine x 2)     Status: None (Preliminary result)   Collection Time: 04/30/16  2:49 PM  Result Value Ref Range Status   Specimen Description BLOOD RIGHT FOREARM  Final   Special Requests BOTTLES DRAWN AEROBIC AND ANAEROBIC 5 ML  Final   Culture   Final    NO GROWTH < 24 HOURS Performed at [facility], [place] [address], [place], [place] [address]    Report Status PENDING  Incomplete  Culture, sputum-assessment  Status: None   Collection Time: 04/30/16  8:41 PM  Result Value Ref Range Status   Specimen Description EXPECTORATED SPUTUM  Final   Special Requests NONE  Final   Sputum evaluation THIS SPECIMEN IS ACCEPTABLE FOR SPUTUM CULTURE  Final   Report Status 04/30/2016 FINAL  Final  Culture, respiratory (NON-Expectorated)     Status: None (Preliminary result)   Collection Time: 04/30/16  8:41 PM  Result Value Ref Range Status   Specimen Description EXPECTORATED SPUTUM  Final   Special Requests NONE Reflexed from [id number]  Final   Gram Stain   Final    FEW WBC PRESENT, PREDOMINANTLY PMN RARE SQUAMOUS EPITHELIAL CELLS PRESENT FEW GRAM NEGATIVE COCCOBACILLI RARE GRAM POSITIVE COCCI IN PAIRS Performed at [facility]    Culture PENDING  Incomplete   Report Status PENDING  Incomplete  MRSA PCR Screening     Status: None   Collection Time: 04/30/16  8:43 PM  Result Value Ref Range Status   MRSA by PCR NEGATIVE NEGATIVE Final    Comment:        The GeneXpert MRSA Assay (FDA approved for NASAL specimens only), is one component of a comprehensive MRSA colonization surveillance program. It is not intended to diagnose MRSA infection nor to guide or monitor treatment for MRSA infections.     Respiratory Panel by PCR     Status: None   Collection Time: 05/01/16 11:22 AM  Result Value Ref Range Status   Adenovirus NOT DETECTED NOT DETECTED Final   Coronavirus 229E NOT DETECTED NOT DETECTED Final   Coronavirus HKU1 NOT DETECTED NOT DETECTED Final   Coronavirus NL63 NOT DETECTED NOT DETECTED Final   Coronavirus OC43 NOT DETECTED NOT DETECTED Final   Metapneumovirus NOT DETECTED NOT DETECTED Final   Rhinovirus / Enterovirus NOT DETECTED NOT DETECTED Final   Influenza A NOT DETECTED NOT DETECTED Final   Influenza B NOT DETECTED NOT DETECTED Final   Parainfluenza Virus 1 NOT DETECTED NOT DETECTED Final   Parainfluenza Virus 2 NOT DETECTED NOT DETECTED Final   Parainfluenza Virus 3 NOT DETECTED NOT DETECTED Final   Parainfluenza Virus 4 NOT DETECTED NOT DETECTED Final   Respiratory Syncytial Virus NOT DETECTED NOT DETECTED Final   Bordetella pertussis NOT DETECTED NOT DETECTED Final   Chlamydophila pneumoniae NOT DETECTED NOT DETECTED Final   Mycoplasma pneumoniae NOT DETECTED NOT DETECTED Final    Comment: Performed at [facility], [place] [address] [place], [place] [address]     Scheduled Meds: . aztreonam  1 g Intravenous Q8H  . budesonide (PULMICORT) nebulizer solution  0.5 mg Nebulization BID  . diltiazem  30 mg Oral Q6H  . feeding supplement (ENSURE ENLIVE)  237 mL Oral BID BM  . ipratropium  0.5 mg Nebulization QID  . levalbuterol  0.63 mg Nebulization QID  . mouth rinse  15 mL Mouth Rinse BID  . methylPREDNISolone (SOLU-MEDROL) injection  40 mg Intravenous Q12H  . sertraline  25 mg Oral Daily  . sodium chloride flush  3 mL Intravenous Q12H  . temazepam  15 mg Oral QHS   Continuous Infusions: . heparin 1,300 Units/hr (05/02/16 0508)    Procedures/Studies: Dg Chest Portable 1 View  Result Date: 04/30/2016 CLINICAL DATA:  Shortness of breath and chest pain today. Productive cough, nausea and fever. EXAM: PORTABLE CHEST 1 VIEW COMPARISON:  Single-view of the  chest 04/18/2016. CT chest 03/27/2016. FINDINGS: The lungs are emphysematous. There is right worse than left basilar airspace disease.  Heart size is normal. No pneumothorax or pleural effusion. IMPRESSION: Right worse left basilar airspace disease has an appearance most consistent with pneumonia. Emphysema. Electronically Signed   By: Inge Rise M.D.   On: 04/30/2016 12:19   Dg Chest Port 1 View  Result Date: 04/18/2016 CLINICAL DATA:  Bronchoscopy.  Left perihilar mass EXAM: PORTABLE CHEST 1 VIEW COMPARISON:  CT 03/27/2016. FINDINGS: Mediastinum is stable. Persistent left perihilar fullness consistent with previously identified mass. Lungs are otherwise clear. No pleural effusion or pneumothorax. Biapical pleural thickening noted consistent with scarring. IMPRESSION: Persistent left perihilar fullness consistent with previously identified perihilar mass. Electronically Signed   By: Marcello Moores  Register   On: 04/18/2016 11:13    Trevontae Lindahl, DO  [facility] Pager [phone]  If 7PM-7AM, please contact night-coverage [email or web address] Password [email or web address] 05/02/2016, 7:55 AM   LOS: 2 days

## 2016-05-02 NOTE — Progress Notes (Signed)
Pharmacy Antibiotic Note  Sheri Ayala is a 66 y.o. female admitted on 04/30/2016 with pneumonia.   She has hx of Lung Ca and COPD, enterobacter PNA.  Recently admitted 04/20/15-->04/21/15 with acute respiratory failure & discharged on Bactrim, steroids.  She presented with fever, productive cough, & shortness of breath. CXR showed new RLL infiltrate since recent admission. Patient started on Vancomycin & Azactam due to reported penicillin allergy. Vancomycin discontinued since MRSA PCR negative. Switching antibiotics to Cefepime per pharmacy dosing for continued treatment of HCAP.  Patient reported to MD that she has tolerated ampicillin in the past. Allergy updated to reflect this.  Plan: Cefepime 1g IV q8h.  Height: '5\' 10"'$  (177.8 cm) Weight: 143 lb 15.4 oz (65.3 kg) IBW/kg (Calculated) : 68.5  Temp (24hrs), Avg:98.1 F (36.7 C), Min:97.4 F (36.3 C), Max:98.7 F (37.1 C)   Recent Labs Lab 04/30/16 1232 04/30/16 1405 04/30/16 1501 05/01/16 0328 05/02/16 0332  WBC 35.1*  --   --  38.1* 25.1*  CREATININE  --  0.58  --  0.51 0.32*  LATICACIDVEN  --   --  0.83  --   --     Estimated Creatinine Clearance: 71.3 mL/min (by C-G formula based on SCr of 0.32 mg/dL (L)).    Allergies  Allergen Reactions  . Citrus     Burning in stomach   . Doxycycline     Caused pt daily headaches to be more severe   . Penicillins Hives, Nausea And Vomiting and Swelling    Patient reports tolerating ampicillin.  Has patient had a PCN reaction causing immediate rash, facial/tongue/throat swelling, SOB or lightheadedness with hypotension: Yes Has patient had a PCN reaction causing severe rash involving mucus membranes or skin necrosis: unknown Has patient had a PCN reaction that required hospitalization No Has patient had a PCN reaction occurring within the last 10 years: No If all of the above answers are "NO", then may proceed with Cephalosporin use.     Antimicrobials this admission:  1/15 Vanc >>  1/16 1/15 Aztreonam >> 1/17 1/17 Cefepime >>  Dose adjustments this admission:  -  Microbiology results:  1/15 BCx: ngtd 1/15 UCx: sent 1/15 sputum: IP 1/15 strep pneumo: neg 1/15 MRSA PCR: neg 1/16 respiratory panel: neg  Thank you for allowing pharmacy to be a part of this patient's care.  Hershal Coria 05/02/2016 8:31 AM

## 2016-05-02 NOTE — Progress Notes (Addendum)
Name: Sheri Ayala MRN:   [id number] DOB:   31-Dec-1950           ADMISSION DATE:  04/30/2016 CONSULTATION DATE:  1/16  REFERRING MD :  Lonny Prude   CHIEF COMPLAINT:  Acute on chronic respiratory failure   BRIEF PATIENT DESCRIPTION:  66 year old female followed by Dr Melvyn Novas. 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 Re-admitted 1/15 w/ HCAP  SIGNIFICANT EVENTS  1/4 discharged s/p non-diagnostic FOB of LLL  1/16 re-admitted w/ HCAP   STUDIES:   Subjective Feeling better. Now up in chair. No distress.   Objective BP (!) 93/58 (BP Location: Left Arm)   Pulse 98   Temp 97.7 F (36.5 C) (Oral)   Resp 20   Ht '5\' 10"'$  (1.778 m)   Wt 143 lb 15.4 oz (65.3 kg)   SpO2 97%   BMI 20.66 kg/m   On 2 liters [place]  Intake/Output Summary (Last 24 hours) at 05/02/16 0947 Last data filed at 05/02/16 0900  Gross per 24 hour  Intake           937.12 ml  Output              350 ml  Net           587.12 ml   General appearance:  66 Year old  female, well nourished  NAD distress,  conversant  Eyes: anicteric, moist conjunctivae; PERRL, EOMI bilaterally. Mouth:  membranes and no mucosal ulcerations; normal hard and soft palate Neck: Trachea midline; neck supple, no JVD, still w/ hoarse phonation but mildly improved.  Lungs/chest: crackles in RLL, with normal respiratory effort and no intercostal retractions, no wheeze CV: RRR, no MRGs  Abdomen: Soft, non-tender; no masses or HSM Extremities: No peripheral edema or extremity lymphadenopathy Skin: Normal temperature, turgor and texture; no rash, ulcers or subcutaneous nodules Psych: Appropriate affect, alert and oriented to person, place and time  CBC Recent Labs     04/30/16  1232  05/01/16  0328  05/02/16  0332  WBC  35.1*  38.1*  25.1*  HGB  12.6  11.6*   10.7*  HCT  38.7  38.4  35.5*  PLT  240  230  191    Coag's Recent Labs     05/01/16  1400  INR  1.44    BMET Recent Labs     04/30/16  1405  05/01/16  0328  05/02/16  0332  NA  139  139  137  K  4.1  4.2  4.7  CL  103  105  104  CO2  '29  27  29  '$ BUN  '15  13  20  '$ CREATININE  0.58  0.51  0.32*  GLUCOSE  94  140*  140*    Electrolytes Recent Labs     04/30/16  1405  04/30/16  2138  05/01/16  0328  05/02/16  0332  CALCIUM  7.3*   --   7.4*  7.9*  MG   --   1.7   --    --     Sepsis Markers No results for input(s): PROCALCITON, O2SATVEN in the last 72 hours.  Invalid input(s): LACTICACIDVEN  ABG No results for input(s): PHART, PCO2ART, PO2ART in the last 72 hours.  Liver Enzymes No  results for input(s): AST, ALT, ALKPHOS, BILITOT, ALBUMIN in the last 72 hours.  Cardiac Enzymes No results for input(s): TROPONINI, PROBNP in the last 72 hours.  Glucose No results for input(s): GLUCAP in the last 72 hours.  Imaging Dg Chest Portable 1 View  Result Date: 04/30/2016 CLINICAL DATA:  Shortness of breath and chest pain today. Productive cough, nausea and fever. EXAM: PORTABLE CHEST 1 VIEW COMPARISON:  Single-view of the chest 04/18/2016. CT chest 03/27/2016. FINDINGS: The lungs are emphysematous. There is right worse than left basilar airspace disease. Heart size is normal. No pneumothorax or pleural effusion. IMPRESSION: Right worse left basilar airspace disease has an appearance most consistent with pneumonia. Emphysema. Electronically Signed   By: Inge Rise M.D.   On: 04/30/2016 12:19   Acute on Chronic Hypoxic & Hypercarbic respiratory failure in the setting of HCAP AECOPD-->improved, no wheeze ->clinically looking much better. RVP was negative.  Plan Agree w/ transfer to medsurg Cont HCAP coverage as we await sensitivities from sputum day  Cont scheduled BDs w/ plan to go back to symbicort at d/c Wean steroids Cont Oxygen Smoking and vaping  cessation is a must   AF w/ RVR CHADsVASC score 5 ->now w/ CVR ->NML EF, + mitral valve prolapse. Mild MR, mild TR.  Plan Per cards Cont tele  LLL lung mass Plan Needs PET scan (scheduled) Needs tissue sampling  CLL Plan F/u w/ heme/onc  Depression Plan Cont zoloft.   Erick Colace ACNP-BC [place] Pager # [phone] OR # [phone] if no answer   ATTENDING NOTE / ATTESTATION NOTE :   I have discussed the case with the resident/APP  Marni Griffon NP.   I agree with the resident/APP's  history, physical examination, assessment, and plans.    I have edited the above note and modified it according to our agreed history, physical examination, assessment and plan.   Briefly, 66 year old female, previous smoker, diagnosed recently with a left lower lobe mass, had bronchoscopy and biopsy on 04/18/2016. She had desaturation during the bronchoscopy which prompted her admission. She was discharged on Bactrim and prednisone taper and oxygen 2 L when necessary. She slowly worsened as far as her dyspnea concerned, associated with cough, generalized weakness, palpitations. She presented to the emergency room. Chest x-ray showed now a right lower lobe pneumonia which was not seen on previous chest x-ray. She was admitted in the stepdown unit for healthcare associated pneumonia as well as acute exacerbation of COPD. She went into rapid A. fib on admission and is currently in SR.  She is significantly improved since admission. Less SOB and wheeze,   At present, she feels better compared to when she came in. Not in distress. Blood pressure 93/58, heart rate 95, respiratory 20, 96% saturation on 2L nasal cannula. Temperature maximum was 98.5 Fahrenheit. NAD. No oral thrush. No neck vein distention. No intercostal retraction. Wheezing in both lung fields which are less today. Crackles in the right base. No rhonchi. Good S1 and S2. No S3. No rub or gallop. Abdomen was benign.  Extremities with negative edema clubbing cyanosis. Skin was warm and dry.  Labs reviewed. Creatinine was 0.32. WBC was 25. Cultures (-) so far.  RVP was (-).   Assessment/Plan: 1. Healthcare associated pneumonia, right lower lung. Clinically better. Unfortunately, cultures remain (-) so it will be hard to determine Abx.   - Suggest to d/c Vanc and cont with aztreonam for now.   - If she continues to improve in  the next days, we can switch her to PO 3rd gen CS  if she is not allergic to it or levofloxacin 750 mg daily. She was discharged on bactrim during the last admission.   2. AECOPD. Improved.  - Cont Pulmicort nebulizer twice a day.  - Continue Atrovent 4 times a day and Xopenex 4x/day.  - Cont IV steroids. Anticipate we can switch to PO in 24 hrs.  She likely will need a long pred taper.   3. Lung Mass in LLL - Needs PET scan on d/c - Needs Rpt Bronchoscopy but with EBUS this time on f/u.  - needs f/u with pulm on d/c   4. Hypoxemia - cont O2 to keep sats > 88%  5. Atrial Fibrillation.   - Cardiology following. On heparin drip.    Family :No family at bedside. Plan d/w pt.  Agree to be transferred out of SDU from my end.    [place], MD 05/02/2016, 11:03 AM [facility] Pager [phone] After 3 pm or if no answer, call [phone]

## 2016-05-02 NOTE — Evaluation (Signed)
Physical Therapy Evaluation Patient Details Name: Sheri Ayala MRN: [id number] DOB: [birth year] Today's Date: 05/02/2016   History of Present Illness  Sheri Ayala is a 66 y.o. female with PMH significant for COPD (on chronic oxygen supplementation, CLL, tobacco abuse , depression, anxiety, left lung mass and hx of CVA (w/o residual deficit); presented with worsening SOB, subjective fever and palpitations. patient CXR checked and found to have PNA; ECG- afib with RVR.Patient just recently admitted for bronchoscopy and biopsy of her left lung mass; subsequently developing COPD exacerbation and resp failure;   Clinical Impression  The patient is mobilizing,. sats dropped to 91% on 3 liters.  Pt admitted with above diagnosis. Pt currently with functional limitations due to the deficits listed below (see PT Problem List). Pt will benefit from skilled PT to increase their independence and safety with mobility to allow discharge to the venue listed below.       Follow Up Recommendations SNF;Supervision/Assistance - 24 hour    Equipment Recommendations  None recommended by PT    Recommendations for Other Services       Precautions / Restrictions Precautions Precautions: Fall Precaution Comments: monitor oxygen      Mobility  Bed Mobility Overal bed mobility: Independent                Transfers Overall transfer level: Needs assistance               General transfer comment: held onto bed  Ambulation/Gait Ambulation/Gait assistance: Min guard Ambulation Distance (Feet): 20 Feet   Gait Pattern/deviations: Step-through pattern     General Gait Details: held onto bed  Stairs            Wheelchair Mobility    Modified Rankin (Stroke Patients Only)       Balance Overall balance assessment: Needs assistance Sitting-balance support: No upper extremity supported;Feet supported Sitting balance-Leahy Scale: Normal     Standing balance support: No upper  extremity supported Standing balance-Leahy Scale: Fair                               Pertinent Vitals/Pain Pain Assessment: No/denies pain    Home Living Family/patient expects to be discharged to:: Private residence Living Arrangements: Spouse/significant other Available Help at Discharge: Friend(s) Type of Home: Mobile home Home Access: Stairs to enter Entrance Stairs-Rails: Right   Home Layout: One level Home Equipment: Cane - single point      Prior Function Level of Independence: Independent               Hand Dominance        Extremity/Trunk Assessment   Upper Extremity Assessment Upper Extremity Assessment: Overall WFL for tasks assessed    Lower Extremity Assessment Lower Extremity Assessment: Overall WFL for tasks assessed    Cervical / Trunk Assessment Cervical / Trunk Assessment: Kyphotic  Communication   Communication: No difficulties  Cognition Arousal/Alertness: Awake/alert Behavior During Therapy: WFL for tasks assessed/performed Overall Cognitive Status: Within Functional Limits for tasks assessed                      General Comments      Exercises     Assessment/Plan    PT Assessment Patient needs continued PT services  PT Problem List Decreased strength;Decreased activity tolerance;Decreased mobility;Decreased knowledge of precautions;Decreased knowledge of use of DME;Cardiopulmonary status limiting activity  PT Treatment Interventions DME instruction;Gait training;Stair training;Functional mobility training;Patient/family education    PT Goals (Current goals can be found in the Care Plan section)  Acute Rehab PT Goals Patient Stated Goal: to go to rehab if I can PT Goal Formulation: With patient Time For Goal Achievement: 05/16/16 Potential to Achieve Goals: Good    Frequency Min 3X/week   Barriers to discharge        Co-evaluation               End of Session Equipment Utilized  During Treatment: Oxygen Activity Tolerance: Patient tolerated treatment well Patient left: in chair;with call bell/phone within reach;with chair alarm set Nurse Communication: Mobility status         Time: 6384-6659 PT Time Calculation (min) (ACUTE ONLY): 20 min   Charges:   PT Evaluation $PT Eval Low Complexity: 1 Procedure     PT G CodesClaretha Cooper 05/02/2016, 1:00 PM Tresa Endo PT [phone]

## 2016-05-03 ENCOUNTER — Inpatient Hospital Stay (HOSPITAL_COMMUNITY): Payer: Medicare HMO

## 2016-05-03 DIAGNOSIS — Z9889 Other specified postprocedural states: Secondary | ICD-10-CM

## 2016-05-03 DIAGNOSIS — I4891 Unspecified atrial fibrillation: Secondary | ICD-10-CM

## 2016-05-03 DIAGNOSIS — J90 Pleural effusion, not elsewhere classified: Secondary | ICD-10-CM

## 2016-05-03 DIAGNOSIS — J189 Pneumonia, unspecified organism: Principal | ICD-10-CM

## 2016-05-03 LAB — BODY FLUID CELL COUNT WITH DIFFERENTIAL
EOS FL: 0 %
Lymphs, Fluid: 24 %
Monocyte-Macrophage-Serous Fluid: 18 % — ABNORMAL LOW (ref 50–90)
Neutrophil Count, Fluid: 58 % — ABNORMAL HIGH (ref 0–25)
WBC FLUID: 617 uL (ref 0–1000)

## 2016-05-03 LAB — GLUCOSE, SEROUS FLUID: Glucose, Fluid: 136 mg/dL

## 2016-05-03 LAB — CBC
HCT: 33.7 % — ABNORMAL LOW (ref 36.0–46.0)
Hemoglobin: 10.1 g/dL — ABNORMAL LOW (ref 12.0–15.0)
MCH: 29.4 pg (ref 26.0–34.0)
MCHC: 30 g/dL (ref 30.0–36.0)
MCV: 98 fL (ref 78.0–100.0)
PLATELETS: 175 10*3/uL (ref 150–400)
RBC: 3.44 MIL/uL — AB (ref 3.87–5.11)
RDW: 14.8 % (ref 11.5–15.5)
WBC: 24.8 10*3/uL — AB (ref 4.0–10.5)

## 2016-05-03 LAB — BASIC METABOLIC PANEL
ANION GAP: 6 (ref 5–15)
Anion gap: 6 (ref 5–15)
BUN: 35 mg/dL — ABNORMAL HIGH (ref 6–20)
BUN: 33 mg/dL — AB (ref 6–20)
CALCIUM: 8.3 mg/dL — AB (ref 8.9–10.3)
CALCIUM: 8.2 mg/dL — AB (ref 8.9–10.3)
CO2: 28 mmol/L (ref 22–32)
CO2: 28 mmol/L (ref 22–32)
CREATININE: 0.53 mg/dL (ref 0.44–1.00)
Chloride: 104 mmol/L (ref 101–111)
Chloride: 105 mmol/L (ref 101–111)
Creatinine, Ser: 0.52 mg/dL (ref 0.44–1.00)
GFR calc non Af Amer: >60 mL/min (ref >60)
Glucose, Bld: 143 mg/dL — ABNORMAL HIGH (ref 65–99)
Glucose, Bld: 125 mg/dL — ABNORMAL HIGH (ref 65–99)
Potassium: 4.8 mmol/L (ref 3.5–5.1)
Potassium: 5 mmol/L (ref 3.5–5.1)
Sodium: 138 mmol/L (ref 135–145)
Sodium: 139 mmol/L (ref 135–145)

## 2016-05-03 LAB — LACTATE DEHYDROGENASE, PLEURAL OR PERITONEAL FLUID: LD FL: 36 U/L — AB (ref 3–23)

## 2016-05-03 LAB — CULTURE, RESPIRATORY W GRAM STAIN

## 2016-05-03 LAB — T4, FREE: Free T4: 1.05 ng/dL (ref 0.61–1.12)

## 2016-05-03 LAB — HEPARIN LEVEL (UNFRACTIONATED): HEPARIN UNFRACTIONATED: 0.46 [IU]/mL (ref 0.30–0.70)

## 2016-05-03 LAB — PROTEIN, BODY FLUID: Total protein, fluid: <3 g/dL

## 2016-05-03 LAB — CULTURE, RESPIRATORY: CULTURE: NORMAL

## 2016-05-03 LAB — LEGIONELLA PNEUMOPHILA SEROGP 1 UR AG: L. pneumophila Serogp 1 Ur Ag: NEGATIVE

## 2016-05-03 LAB — PROTEIN, TOTAL: TOTAL PROTEIN: 4.9 g/dL — AB (ref 6.5–8.1)

## 2016-05-03 LAB — LACTATE DEHYDROGENASE: LDH: 127 U/L (ref 98–192)

## 2016-05-03 LAB — CHOLESTEROL, TOTAL: CHOLESTEROL: 107 mg/dL (ref 0–200)

## 2016-05-03 MED ORDER — HEPARIN (PORCINE) IN NACL 100-0.45 UNIT/ML-% IJ SOLN
1500.0000 [IU]/h | INTRAMUSCULAR | Status: AC
  Administered 2016-05-03: 1500 [IU]/h via INTRAVENOUS
  Filled 2016-05-03 (×2): qty 250

## 2016-05-03 MED ORDER — LEVALBUTEROL HCL 0.63 MG/3ML IN NEBU
0.6300 mg | INHALATION_SOLUTION | Freq: Three times a day (TID) | RESPIRATORY_TRACT | Status: DC
  Administered 2016-05-03 – 2016-05-07 (×14): 0.63 mg via RESPIRATORY_TRACT
  Filled 2016-05-03 (×14): qty 3

## 2016-05-03 MED ORDER — LEVOFLOXACIN 750 MG PO TABS
750.0000 mg | ORAL_TABLET | Freq: Every day | ORAL | Status: AC
  Administered 2016-05-03 – 2016-05-07 (×5): 750 mg via ORAL
  Filled 2016-05-03 (×5): qty 1

## 2016-05-03 MED ORDER — PREDNISONE 20 MG PO TABS
40.0000 mg | ORAL_TABLET | Freq: Every day | ORAL | Status: DC
  Administered 2016-05-03 – 2016-05-04 (×2): 40 mg via ORAL
  Filled 2016-05-03 (×2): qty 2

## 2016-05-03 MED ORDER — KETOROLAC TROMETHAMINE 15 MG/ML IJ SOLN
15.0000 mg | Freq: Four times a day (QID) | INTRAMUSCULAR | Status: DC
  Administered 2016-05-03 – 2016-05-04 (×5): 15 mg via INTRAVENOUS
  Filled 2016-05-03 (×5): qty 1

## 2016-05-03 MED ORDER — IPRATROPIUM BROMIDE 0.02 % IN SOLN
0.5000 mg | Freq: Three times a day (TID) | RESPIRATORY_TRACT | Status: DC
  Administered 2016-05-03 – 2016-05-07 (×14): 0.5 mg via RESPIRATORY_TRACT
  Filled 2016-05-03 (×14): qty 2.5

## 2016-05-03 NOTE — Progress Notes (Signed)
Name: Sheri Ayala MRN:   [id number] DOB:   1950-12-09           ADMISSION DATE:  04/30/2016 CONSULTATION DATE:  1/16  REFERRING MD :  Lonny Prude   CHIEF COMPLAINT:  Acute on chronic respiratory failure   BRIEF PATIENT DESCRIPTION:  66 year old female followed by Dr Melvyn Novas. 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 Re-admitted 1/15 w/ HCAP  SIGNIFICANT EVENTS  1/4 discharged s/p non-diagnostic FOB of LLL  1/16 re-admitted w/ HCAP   STUDIES:  sputum 1/18 few gnc coccobacilli and rare gpc pairs. RVP negative   Subjective Still having posterior neck pain   Objective BP (!) 101/57 (BP Location: Left Arm)   Pulse 98   Temp 98.7 F (37.1 C) (Oral)   Resp (!) 24   Ht '5\' 10"'$  (1.778 m)   Wt 146 lb 2.6 oz (66.3 kg)   SpO2 95%   BMI 20.97 kg/m   On 2 liters [place]  Intake/Output Summary (Last 24 hours) at 05/03/16 0842 Last data filed at 05/03/16 0600  Gross per 24 hour  Intake            799.5 ml  Output              500 ml  Net            299.5 ml  General appearance:  66 Year old  Female/female,cachectic  NAD Eyes: anicteric sclerae icteric , moist conjunctivae; PERRL, EOMI bilaterally. Mouth:  membranes and no mucosal ulcerations; normal hard and soft palate Neck: Trachea midline; neck supple, no JVD Lungs/chest: normal respiratory effort and no intercostal retractions, decreased on right posterior base  CV: RRR, no MRGs  Abdomen: Soft, non-tender; no masses or HSM Extremities: No peripheral edema or extremity lymphadenopathy Skin: Normal temperature, turgor and texture; no rash, ulcers or subcutaneous nodules Psych: Appropriate affect, alert and oriented to person, place and time  CBC Recent Labs     05/01/16  0328  05/02/16  0332  05/03/16  0355  WBC  38.1*  25.1*  24.8*  HGB  11.6*   10.7*  10.1*  HCT  38.4  35.5*  33.7*  PLT  230  191  175    Coag's Recent Labs     05/01/16  1400  INR  1.44    BMET Recent Labs     05/01/16  0328  05/02/16  0332  05/03/16  0355  NA  139  137  138  K  4.2  4.7  4.8  CL  105  104  104  CO2  '27  29  28  '$ BUN  13  20  35*  CREATININE  0.51  0.32*  0.52  GLUCOSE  140*  140*  143*    Electrolytes Recent Labs     04/30/16  2138  05/01/16  0328  05/02/16  0332  05/03/16  0355  CALCIUM   --   7.4*  7.9*  8.3*  MG  1.7   --    --    --     Sepsis Markers No results for input(s): PROCALCITON, O2SATVEN in the last 72 hours.  Invalid input(s): LACTICACIDVEN  ABG No results for input(s): PHART, PCO2ART, PO2ART in the last 72 hours.  Liver Enzymes No  results for input(s): AST, ALT, ALKPHOS, BILITOT, ALBUMIN in the last 72 hours.  Cardiac Enzymes No results for input(s): TROPONINI, PROBNP in the last 72 hours.  Glucose No results for input(s): GLUCAP in the last 72 hours.  Imaging No results found.   Impression/plan  Acute on chronic hypoxic/hypercarbic respiratory failure in the setting HCAP and AECOPD Mod sized right effusion LLL Lung mass Plan Cont HCAP coverage (changed to cefepime 1/17; will change her to respiratory quinolone in effort to transition to oral therapy) and complete 8d rx Wean pred Diagnostic/therapeutic thoracentesis today  Cont BDs w/ plan to change back to symbicort at dc  Cont O2 Smoking cessation  Needs PET scan and repeat attempt at tissue sampling  Have contacted office for f/u they will notify pt of appointment   All other issues: AF, CLL, depression & deconditioning-->per primary service and cards.    Erick Colace ACNP-BC [place] Pager # [phone] OR # [phone] if no answer  ATTENDING NOTE / ATTESTATION NOTE :   ATTENDING NOTE / ATTESTATION NOTE :   I have discussed the case with the resident/APP  Marni Griffon NP.   I agree with the  resident/APP's  history, physical examination, assessment, and plans.    I have edited the above note and modified it according to our agreed history, physical examination, assessment and plan.   Briefly, 66 year old female, previous smoker, diagnosed recently with a left lower lobe mass, had bronchoscopy and biopsy on 04/18/2016. She had desaturation during the bronchoscopy which prompted her admission. She was discharged on Bactrim and prednisone taper and oxygen 2 L when necessary. She slowly worsened as far as her dyspnea was concerned, associated with cough, generalized weakness, palpitations. She presented to the emergency room. Chest x-ray showed now a right lower lobe pneumonia which was not seen on previous chest x-ray. She was admitted in the stepdown unit for healthcare associated pneumonia as well as acute exacerbation of COPD. She went into rapid A. fib on admission and is currently in SR.  She is significantly improved since admission. Less SOB and wheeze.  CXR on 1/18 with worse infiltrate in RLL with smal pleural effusion.   At present, she feels better compared to when she came in. Not in distress. Blood pressure 101/60, heart rate 80s,  respiratory 20, 96% saturation on 2L nasal cannula. Temperature 97.3 Fahrenheit. NAD. No oral thrush. No neck vein distention. No intercostal retraction. Wheezing in both lung fields which have significantly improved. Crackles in the right base. No rhonchi. Good S1 and S2. No S3. No rub or gallop. Abdomen was benign. Extremities with negative edema clubbing cyanosis. Skin was warm and dry.  Labs reviewed. Creatinine was 0.53. WBC was 25. Cultures (-) so far.  RVP was (-).   Assessment/Plan: 1. Healthcare associated pneumonia, right lower lung. Clinically better. CXR on 1/18 with worse infiltrate and pleural effusion - plan for diagnostic thoracentesis this noon. Heparin has been discontinued.  - cont PO Levofloxacin.   2. AECOPD. Improved.  -  Cont Pulmicort nebulizer twice a day.  - Continue Atrovent 4 times a day and Xopenex 4x/day.  - IV steroids were d/c'd on 1/18. Will start PO pred at 40 mg/d.   3. Lung Mass in LLL - Needs PET scan on d/c - Needs Rpt Bronchoscopy but with EBUS this time on f/u.  - needs f/u with pulm on d/c   4. Hypoxemia - cont O2 to keep sats > 88%  5. Atrial  Fibrillation.  - Cardiology following. On heparin drip.    Family :No family at bedside. Plan d/w pt.   Monica Becton, MD 05/03/2016, 11:22 AM [facility] Pager [phone] After 3 pm or if no answer, call [phone]

## 2016-05-03 NOTE — Progress Notes (Signed)
Pt arrived from ICU in w/c. Stand/pivot to bed w/ standby assist. VSS. Pt oriented to callbell and environment. Assessement as charted. No c/o at present.

## 2016-05-03 NOTE — Clinical Social Work Note (Signed)
Clinical Social Work Assessment  Patient Details  Name: Sheri Ayala MRN: [id number] Date of Birth: [birth year]  Date of referral:  05/03/16               Reason for consult:  Discharge Planning                Permission sought to share information with:  Chartered certified accountant granted to share information::  Yes, Verbal Permission Granted  Name::        Agency::     Relationship::     Contact Information:     Housing/Transportation Living arrangements for the past 2 months:  Single Family Home Source of Information:  Patient Patient Interpreter Needed:  None Criminal Activity/Legal Involvement Pertinent to Current Situation/Hospitalization:  No - Comment as needed Significant Relationships:  Adult Children Lives with:  Self Do you feel safe going back to the place where you live?    Need for family participation in patient care:  No (Coment)  Care giving concerns:  Pt's care cannot be managed at home following hospital d/c.    Social Worker assessment / plan:  Pt hospitalized from home on 04/30/16 with acute on chronic resp failure with hypoxia. PT has recommended SNF at d/c. CSW met with pt at bedside to review recommendations with pt. Pt is in agreement with plan for SNF when stable. SNF search has been initiated and bed offers are pending. Pt has [employer] which requires prior authorization for SNF placement. CSW will continue to follow to assist with d/c planning to SNF. Employment status:  Retired Nurse, adult PT Recommendations:  [place] / Referral to community resources:  [place]  Patient/Family's Response to care:  Pt is in agreement with plan for SNF at d/c.  Patient/Family's Understanding of and Emotional Response to Diagnosis, Current Treatment, and Prognosis:  Pt reports that she's  not feeling that well. She is aware that she will be transferring out of ICU/SD unit to  telemetry today. Pt reports that she is looking forward to feeling better and appreciates CSW assistance with d/c planning.  Emotional Assessment Appearance:  Appears stated age Attitude/Demeanor/Rapport:  Other (cooperative) Affect (typically observed):  Appropriate, Calm, Pleasant Orientation:  Oriented to Self, Oriented to Place, Oriented to  Time, Oriented to Situation Alcohol / Substance use:  Not Applicable Psych involvement (Current and /or in the community):  No (Comment)  Discharge Needs  Concerns to be addressed:  Discharge Planning Concerns Readmission within the last 30 days:  Yes Current discharge risk:  None Barriers to Discharge:  No Barriers Identified   Gwenna Fuston, Randall An, LCSW 05/03/2016, 3:35 PM

## 2016-05-03 NOTE — Progress Notes (Signed)
Pharmacy: Re- heparin  Patient's a 66 y.o F on heparin for new onset afib.  Heparin level now back therapeutic at 0.46 with heparin rate at 1500 units/hr.  No bleeding documented.  Plan: - continue heparin drip at 1500 units/hr - f/u with AM labs and adjust if needed

## 2016-05-03 NOTE — Progress Notes (Signed)
Patient Name: Sheri Ayala Date of Encounter: 05/03/2016  Primary Cardiologist: Dr [facility] Problem List     Principal Problem:   Acute on chronic respiratory failure with hypoxia [employer]) Active Problems:   Atrial fibrillation with rapid ventricular response (HCC)   COPD GOLD III    Mass of left lung   HCAP (healthcare-associated pneumonia)   Abnormal TSH   CLL (chronic lymphocytic leukemia) (HCC)   History of CVA (cerebrovascular accident)     Subjective   Dyspnea improving; she is unaware of AF  Inpatient Medications    Scheduled Meds: . budesonide (PULMICORT) nebulizer solution  0.5 mg Nebulization BID  . ceFEPime (MAXIPIME) IV  1 g Intravenous Q8H  . diltiazem  120 mg Oral Daily  . feeding supplement (ENSURE ENLIVE)  237 mL Oral BID BM  . ipratropium  0.5 mg Nebulization TID  . levalbuterol  0.63 mg Nebulization TID  . mouth rinse  15 mL Mouth Rinse BID  . methylPREDNISolone (SOLU-MEDROL) injection  40 mg Intravenous Q12H  . sertraline  25 mg Oral Daily  . sodium chloride flush  3 mL Intravenous Q12H  . temazepam  15 mg Oral QHS   Continuous Infusions: . heparin 1,500 Units/hr (05/03/16 0130)   PRN Meds: sodium chloride, acetaminophen, ALPRAZolam, sodium chloride flush   Vital Signs    Vitals:   05/02/16 1959 05/02/16 2200 05/03/16 0205 05/03/16 0540  BP:    (!) 101/57  Pulse:      Resp:  (!) 24  (!) 24  Temp:      TempSrc:      SpO2: 100% 94%  94%  Weight:   146 lb 2.6 oz (66.3 kg)   Height:        Intake/Output Summary (Last 24 hours) at 05/03/16 0821 Last data filed at 05/03/16 0600  Gross per 24 hour  Intake            799.5 ml  Output              500 ml  Net            299.5 ml   Filed Weights   05/01/16 0432 05/02/16 0500 05/03/16 0205  Weight: 138 lb 0.1 oz (62.6 kg) 143 lb 15.4 oz (65.3 kg) 146 lb 2.6 oz (66.3 kg)    Physical Exam   GEN: Well nourished, chronically ill appearing in no acute distress.  HEENT: Grossly  normal.  Neck: Supple Cardiac: irregular; no pedal edema Respiratory:  Diminished BS throughout GI: Soft, nontender, nondistended MS: no deformity or atrophy. Skin: warm and dry, no rash. Neuro:  Strength and sensation are intact. Psych: AAOx3.  Normal affect.  Labs    CBC  Recent Labs  05/02/16 0332 05/03/16 0355  WBC 25.1* 24.8*  HGB 10.7* 10.1*  HCT 35.5* 33.7*  MCV 99.7 98.0  PLT 191 474   Basic Metabolic Panel  Recent Labs  04/30/16 2138  05/02/16 0332 05/03/16 0355  NA  --   < > 137 138  K  --   < > 4.7 4.8  CL  --   < > 104 104  CO2  --   < > 29 28  GLUCOSE  --   < > 140* 143*  BUN  --   < > 20 35*  CREATININE  --   < > 0.32* 0.52  CALCIUM  --   < > 7.9* 8.3*  MG 1.7  --   --   --   < > =  values in this interval not displayed. Thyroid Function Tests  Recent Labs  04/30/16 2138  TSH 0.310*    Telemetry    Atrial fibrillation rate controlled- Personally Reviewed  Patient Profile     66 year old female with past medical history of CVA, COPD and lung mass with new onset atrial fibrillation. Patient presented with pneumonia. She was noted to be in atrial fibrillation. TSH 0.310. Echocardiogram shows normal LV function, mild mitral regurgitation and moderate tricuspid regurgitation.    Assessment & Plan    1. Newly diagnosed atrial fibrillation-duration is unclear. Echo shows normal LV function. TSH is low. Await free T4.  Rate controlled;  Cardizem changed to CD.  2 lung mass-further evaluation per pulmonary.  3 COPD-continue bronchodilators.  4 pneumonia-continue antibiotics. Per primary care.   Plan: free T4 ordered. Chadsvasc 4. Continue heparin. When it is clear she will not require further procedures would add apixaban. Patient developed hypotension with IV Cardizem, tolerating Diltiazem CD 120.   Signed, Kerin Ransom, PA-C  05/03/2016, 8:21 AM  As above, patient seen and examined. Her dyspnea has improved. Some chest pain with  cough. She remains in atrial fibrillation. Continue Cardizem for rate control. Once all procedures complete would initiate apixaban. LV function is normal. Free T4 pending. Once she goes home she can follow-up with me for her atrial fibrillation. We will sign off. Please call with questions.  Kirk Ruths, MD

## 2016-05-03 NOTE — Procedures (Signed)
Thoracentesis Procedure Note  Pre-operative Diagnosis: thoracentesis   Post-operative Diagnosis: same  Indications: evaluation and evacuation of pleural fluid   Procedure Details  Consent: Informed consent was obtained. Risks of the procedure were discussed including: infection, bleeding, pain, pneumothorax.  Under sterile conditions the patient was positioned. Betadine solution and sterile drapes were utilized.  1% buffered lidocaine was used to anesthetize the  rib space which was identified via real time [place]. Fluid was obtained without any difficulties and minimal blood loss.  A dressing was applied to the wound and wound care instructions were provided.   Findings 750  ml of clear pleural fluid was obtained. A sample was sent to Pathology for cytogenetics, flow, and cell counts, as well as for infection analysis.  Complications:  None; patient tolerated the procedure well.          Condition: stable  Plan A follow up chest x-ray was ordered. Added toradol X 5 doses for pain   Erick Colace ACNP-BC [place] Pager # [phone] OR # [phone] if no answer

## 2016-05-03 NOTE — Progress Notes (Addendum)
PROGRESS NOTE  Sheri Ayala CHY:[id number] DOB: [birth year] DOA: 04/30/2016 PCP: Alvester Chou, NP  Brief History:  66 y.o.with a history of COPD, CLL, tobacco abuse, depression, anxiety, left lung mass, history of CVA. She presented with dyspnea, subjective fever, palpitations. She is found to have runs a infection and initially presented in A. fib with RVR. 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 She was discharged to home 1/5. She states she actually began to feel worse pretty much the day after d/c. She reported increased nasal congestion, increased cough, progressive weakness and worsening shortness of breath. Also was having palpitations. This occurred in spite of pred taper and antibiotics which were continued after d/c. In the ER she was noted to be in AF w/ RVR, O2 sats were in the mid-80s, and CXR showed new RLL infiltrate. She was admitted to the medical service in the step-down setting   Assessment/Plan: Acute on chronic respiratory failure with hypoxia -Secondary to HCAP in setting of AECOPD -Wean O2 to home demand -Normally on 2 L nasal cannula at home -appreciate Pulm consult -continue xopenex, atrovent -continue solumedrol IV-->plan slow taper  HCAP -D/C aztreonam -Discontinue vancomycin -05/02/16--started cefepime (pt states she took ampicillin in past without problems)--questionable PCN allergy -05/03/16--switch to levoflox per pulm -RVP negative  Chronic lymphocytic leukemia Stable. Outpatient follow-up with oncology   Mass of left lung -Status post bronchoscopy and biopsy with pulmonology.  -Outpatient follow-up -need outpt PET scan -Will need repeat attempt at tissue sampling after acute infection resolved. Dr Melvyn Novas can arrange this in out-pt setting per  pulmonary  Atrial fibrillation with rapid ventricular response--newly diagnosed--duration unclear - CHA2DS2-VASc Scoreis 4. -Appreciate Cardiology recommendations -continue diltiazem 30 mg po q 6 hrs-->diltiazem CD 120 mg-->rate controlled -2-D echocardiogram--EF 60-65%, moderate TR, no WMA, PASP 37 -TSH--0.310 -check free T4 -continue IV heparin for now-->plan home with apixaban if no further interventions during this admit  Depression Anxiety Insomnia -Continue when necessary Xanax -Continue Restoril -Continue Zoloft    Disposition Plan:   Home 1/19 or 1/20--awaiting transfer to telemetry Family Communication:   No Family at bedside   Consultants:   Cardiology, pulmonary  Code Status:  FULL  DVT Prophylaxis:  IV Heparin    Procedures: As Listed in Progress Note Above  Antibiotics: Levoflox 05/03/16>>> Cefepime 1/17>>>05/03/16 Aztreonam 1/15>>1/17 Vanc 1/15>>1/16     Subjective: Overall, breathing is improving but remains some dyspnea on exertion. Denies any fevers, chills, nausea, vomiting, diarrhea, abdominal pain. No dysuria or hematuria. Continues to have nonproductive cough.  Objective: Vitals:   05/02/16 2200 05/03/16 0205 05/03/16 0540 05/03/16 0828  BP:   (!) 101/57   Pulse:      Resp: (!) 24  (!) 24   Temp:    97.3 F (36.3 C)  TempSrc:    Oral  SpO2: 94%  94% 95%  Weight:  66.3 kg (146 lb 2.6 oz)    Height:        Intake/Output Summary (Last 24 hours) at 05/03/16 0913 Last data filed at 05/03/16 0600  Gross per 24 hour  Intake            786.5 ml  Output              500 ml  Net  286.5 ml   Weight change: 1 kg (2 lb 3.3 oz) Exam:   General:  Pt is alert, follows commands appropriately, not in acute distress  HEENT: No icterus, No thrush, No neck mass, [place]/AT  Cardiovascular: RRR, S1/S2, no rubs, no gallops  Respiratory: Bibasilar crackles, right greater than left. No wheezing.  Abdomen: Soft/+BS, non tender,  non distended, no guarding  Extremities: No edema, No lymphangitis, No petechiae, No rashes, no synovitis   Data Reviewed: I have personally reviewed following labs and imaging studies Basic Metabolic Panel:  Recent Labs Lab 04/30/16 1405 04/30/16 2138 05/01/16 0328 05/02/16 0332 05/03/16 0355  NA 139  --  139 137 138  K 4.1  --  4.2 4.7 4.8  CL 103  --  105 104 104  CO2 29  --  '27 29 28  '$ GLUCOSE 94  --  140* 140* 143*  BUN 15  --  13 20 35*  CREATININE 0.58  --  0.51 0.32* 0.52  CALCIUM 7.3*  --  7.4* 7.9* 8.3*  MG  --  1.7  --   --   --    Liver Function Tests: No results for input(s): AST, ALT, ALKPHOS, BILITOT, PROT, ALBUMIN in the last 168 hours. No results for input(s): LIPASE, AMYLASE in the last 168 hours. No results for input(s): AMMONIA in the last 168 hours. Coagulation Profile:  Recent Labs Lab 05/01/16 1400  INR 1.44   CBC:  Recent Labs Lab 04/30/16 1232 05/01/16 0328 05/02/16 0332 05/03/16 0355  WBC 35.1* 38.1* 25.1* 24.8*  HGB 12.6 11.6* 10.7* 10.1*  HCT 38.7 38.4 35.5* 33.7*  MCV 95.8 97.7 99.7 98.0  PLT 240 230 191 175   Cardiac Enzymes: No results for input(s): CKTOTAL, CKMB, CKMBINDEX, TROPONINI in the last 168 hours. BNP: Invalid input(s): POCBNP CBG: No results for input(s): GLUCAP in the last 168 hours. HbA1C: No results for input(s): HGBA1C in the last 72 hours. Urine analysis:    Component Value Date/Time   COLORURINE STRAW (A) 09/24/2015 1946   APPEARANCEUR CLEAR (A) 09/24/2015 1946   LABSPEC 1.010 09/24/2015 1946   PHURINE 5.0 09/24/2015 1946   GLUCOSEU NEGATIVE 09/24/2015 1946   HGBUR 1+ (A) 09/24/2015 1946   BILIRUBINUR NEGATIVE 09/24/2015 1946   KETONESUR NEGATIVE 09/24/2015 1946   PROTEINUR NEGATIVE 09/24/2015 1946   UROBILINOGEN 0.2 01/06/2015 1843   NITRITE NEGATIVE 09/24/2015 1946   LEUKOCYTESUR NEGATIVE 09/24/2015 1946   Sepsis Labs: '@LABRCNTIP'$ (procalcitonin:4,lacticidven:4) ) Recent Results (from the past  240 hour(s))  Blood culture (routine x 2)     Status: None (Preliminary result)   Collection Time: 04/30/16 12:35 PM  Result Value Ref Range Status   Specimen Description BLOOD BLOOD RIGHT FOREARM  Final   Special Requests BOTTLES DRAWN AEROBIC AND ANAEROBIC 5 CC  Final   Culture   Final    NO GROWTH 2 DAYS Performed at [facility], [place] [address], [place], [place] [address]    Report Status PENDING  Incomplete  Blood culture (routine x 2)     Status: None (Preliminary result)   Collection Time: 04/30/16  2:49 PM  Result Value Ref Range Status   Specimen Description BLOOD RIGHT FOREARM  Final   Special Requests BOTTLES DRAWN AEROBIC AND ANAEROBIC 5 ML  Final   Culture   Final    NO GROWTH 2 DAYS Performed at [facility], [place] [address], [place], [place] [address]    Report Status PENDING  Incomplete  Culture, sputum-assessment     Status: None   Collection Time: 04/30/16  8:41 PM  Result Value Ref Range Status   Specimen Description EXPECTORATED SPUTUM  Final   Special Requests NONE  Final   Sputum evaluation THIS SPECIMEN IS ACCEPTABLE FOR SPUTUM CULTURE  Final   Report Status 04/30/2016 FINAL  Final  Urine culture     Status: None   Collection Time: 04/30/16  8:41 PM  Result Value Ref Range Status   Specimen Description URINE, RANDOM  Final   Special Requests NONE  Final   Culture   Final    NO GROWTH Performed at [facility], [place] [address] [place], [place] [address]    Report Status 05/02/2016 FINAL  Final  Culture, respiratory (NON-Expectorated)     Status: None (Preliminary result)   Collection Time: 04/30/16  8:41 PM  Result Value Ref Range Status   Specimen Description EXPECTORATED SPUTUM  Final   Special Requests NONE Reflexed from [id number]  Final   Gram Stain   Final    FEW WBC PRESENT, PREDOMINANTLY PMN RARE SQUAMOUS EPITHELIAL CELLS PRESENT FEW GRAM NEGATIVE COCCOBACILLI RARE GRAM POSITIVE COCCI IN PAIRS    Culture   Final    CULTURE  REINCUBATED FOR BETTER GROWTH Performed at [facility], [place] [address], [place], [place] [address]    Report Status PENDING  Incomplete  MRSA PCR Screening     Status: None   Collection Time: 04/30/16  8:43 PM  Result Value Ref Range Status   MRSA by PCR NEGATIVE NEGATIVE Final    Comment:        The GeneXpert MRSA Assay (FDA approved for NASAL specimens only), is one component of a comprehensive MRSA colonization surveillance program. It is not intended to diagnose MRSA infection nor to guide or monitor treatment for MRSA infections.   Respiratory Panel by PCR     Status: None   Collection Time: 05/01/16 11:22 AM  Result Value Ref Range Status   Adenovirus NOT DETECTED NOT DETECTED Final   Coronavirus 229E NOT DETECTED NOT DETECTED Final   Coronavirus HKU1 NOT DETECTED NOT DETECTED Final   Coronavirus NL63 NOT DETECTED NOT DETECTED Final   Coronavirus OC43 NOT DETECTED NOT DETECTED Final   Metapneumovirus NOT DETECTED NOT DETECTED Final   Rhinovirus / Enterovirus NOT DETECTED NOT DETECTED Final   Influenza A NOT DETECTED NOT DETECTED Final   Influenza B NOT DETECTED NOT DETECTED Final   Parainfluenza Virus 1 NOT DETECTED NOT DETECTED Final   Parainfluenza Virus 2 NOT DETECTED NOT DETECTED Final   Parainfluenza Virus 3 NOT DETECTED NOT DETECTED Final   Parainfluenza Virus 4 NOT DETECTED NOT DETECTED Final   Respiratory Syncytial Virus NOT DETECTED NOT DETECTED Final   Bordetella pertussis NOT DETECTED NOT DETECTED Final   Chlamydophila pneumoniae NOT DETECTED NOT DETECTED Final   Mycoplasma pneumoniae NOT DETECTED NOT DETECTED Final    Comment: Performed at [facility], [place] [address] [place], [place] [address]     Scheduled Meds: . budesonide (PULMICORT) nebulizer solution  0.5 mg Nebulization BID  . diltiazem  120 mg Oral Daily  . feeding supplement (ENSURE ENLIVE)  237 mL Oral BID BM  . ipratropium  0.5 mg Nebulization TID  . levalbuterol  0.63 mg  Nebulization TID  . levofloxacin  750 mg Oral Daily  . mouth rinse  15 mL Mouth Rinse BID  . sertraline  25 mg Oral Daily  .  sodium chloride flush  3 mL Intravenous Q12H  . temazepam  15 mg Oral QHS   Continuous Infusions: . heparin 1,500 Units/hr (05/03/16 0130)    Procedures/Studies: Dg Chest Portable 1 View  Result Date: 04/30/2016 CLINICAL DATA:  Shortness of breath and chest pain today. Productive cough, nausea and fever. EXAM: PORTABLE CHEST 1 VIEW COMPARISON:  Single-view of the chest 04/18/2016. CT chest 03/27/2016. FINDINGS: The lungs are emphysematous. There is right worse than left basilar airspace disease. Heart size is normal. No pneumothorax or pleural effusion. IMPRESSION: Right worse left basilar airspace disease has an appearance most consistent with pneumonia. Emphysema. Electronically Signed   By: Inge Rise M.D.   On: 04/30/2016 12:19   Dg Chest Port 1 View  Result Date: 04/18/2016 CLINICAL DATA:  Bronchoscopy.  Left perihilar mass EXAM: PORTABLE CHEST 1 VIEW COMPARISON:  CT 03/27/2016. FINDINGS: Mediastinum is stable. Persistent left perihilar fullness consistent with previously identified mass. Lungs are otherwise clear. No pleural effusion or pneumothorax. Biapical pleural thickening noted consistent with scarring. IMPRESSION: Persistent left perihilar fullness consistent with previously identified perihilar mass. Electronically Signed   By: Marcello Moores  Register   On: 04/18/2016 11:13    Avyana Puffenbarger, DO  [facility] Pager [phone]  If 7PM-7AM, please contact night-coverage [email or web address] Password [email or web address] 05/03/2016, 9:13 AM   LOS: 3 days

## 2016-05-03 NOTE — NC FL2 (Signed)
[place] LEVEL OF CARE SCREENING TOOL     IDENTIFICATION  Patient Name: Sheri Ayala Birthdate: 13-Jun-1950 Sex: female Admission Date (Current Location): 04/30/2016  [facility] and [place] Number:  Herbalist and Address:  [facility],  [place] [address] [place]      Provider Number: [id number]  Attending Physician Name and Address:  Orson Eva, MD  Relative Name and Phone Number:       Current Level of Care: Hospital Recommended Level of Care: [place] Prior Approval Number:    Date Approved/Denied:   PASRR Number: [id number]  Discharge Plan: SNF    Current Diagnoses: Patient Active Problem List   Diagnosis Date Noted  . S/P thoracentesis   . Pleural effusion on right   . Abnormal TSH 05/01/2016  . History of CVA (cerebrovascular accident) 05/01/2016  . HCAP (healthcare-associated pneumonia) 04/30/2016  . Atrial fibrillation with rapid ventricular response ([place]) 04/30/2016  . Severe protein-calorie malnutrition ([place])   . Acute on chronic respiratory failure with hypoxemia ([place]) 04/18/2016  . COPD GOLD III  04/10/2016  . Mass of left lung 04/10/2016  . Acute on chronic respiratory failure with hypoxia ([place]) 01/30/2015  . Right lower lobe pneumonia ([place]) 01/30/2015  . Generalized weakness 01/30/2015  . COPD exacerbation ([place]) 01/29/2015  . SOB (shortness of breath) 12/24/2012  . Dizziness 12/22/2012  . COPD with acute exacerbation ([place]) 12/22/2012  . Anemia 03/28/2011  . Cellulitis of both external ears 03/27/2011  . Headache in front of head 03/27/2011  . Thrombocytopenia ([place]) 03/27/2011  . Acute frontal sinusitis 03/10/2011  . Cellulitis of elbow 03/09/2011  . Bursitis of elbow 03/09/2011  . CLL (chronic lymphocytic leukemia) ([place]) 03/09/2011  . Cigarette smoker 03/09/2011    Orientation RESPIRATION BLADDER Height & Weight     Self, Time, Situation, Place  O2 Continent Weight: 146 lb 2.6 oz  (66.3 kg) Height:  '5\' 10"'$  (177.8 cm)  BEHAVIORAL SYMPTOMS/MOOD NEUROLOGICAL BOWEL NUTRITION STATUS  Other (Comment) (no behaviors)   Continent Diet  AMBULATORY STATUS COMMUNICATION OF NEEDS Skin   Limited Assist Verbally Normal                       Personal Care Assistance Level of Assistance  Bathing, Feeding, Dressing Bathing Assistance: Limited assistance Feeding assistance: Independent Dressing Assistance: Limited assistance     Functional Limitations Info  Sight, Hearing, Speech Sight Info: Impaired Hearing Info: Adequate Speech Info: Adequate    SPECIAL CARE FACTORS FREQUENCY  PT (By licensed PT), OT (By licensed OT)     PT Frequency: 5x wk OT Frequency: 5x wk            Contractures Contractures Info: Not present    Additional Factors Info  Code Status Code Status Info: Full Code             Current Medications (05/03/2016):  This is the current hospital active medication list Current Facility-Administered Medications  Medication Dose Route Frequency Provider Last Rate Last Dose  . 0.9 %  sodium chloride infusion  250 mL Intravenous PRN Barton Dubois, MD      . acetaminophen (TYLENOL) tablet 650 mg  650 mg Oral Q6H PRN Orson Eva, MD   650 mg at 05/03/16 4782  . ALPRAZolam Duanne Moron) tablet 0.5 mg  0.5 mg Oral BID PRN Barton Dubois, MD   0.5 mg at 05/03/16 1321  . budesonide (PULMICORT) nebulizer solution 0.5 mg  0.5  mg Nebulization BID Rush Landmark, MD   0.5 mg at 05/03/16 8938  . diltiazem (CARDIZEM CD) 24 hr capsule 120 mg  120 mg Oral Daily Lelon Perla, MD   120 mg at 05/03/16 0852  . feeding supplement (ENSURE ENLIVE) (ENSURE ENLIVE) liquid 237 mL  237 mL Oral BID BM Barton Dubois, MD   237 mL at 05/03/16 1000  . heparin ADULT infusion 100 units/mL (25000 units/236m sodium chloride 0.45%)  1,500 Units/hr Intravenous Continuous DOrson Eva MD 15 mL/hr at 05/03/16 1500 1,500 Units/hr at 05/03/16 1500  . ipratropium (ATROVENT) nebulizer  solution 0.5 mg  0.5 mg Nebulization TID DOrson Eva MD   0.5 mg at 05/03/16 1428  . ketorolac (TORADOL) 15 MG/ML injection 15 mg  15 mg Intravenous Q6H PErick Colace NP   15 mg at 05/03/16 1227  . levalbuterol (XOPENEX) nebulizer solution 0.63 mg  0.63 mg Nebulization TID DOrson Eva MD   0.63 mg at 05/03/16 1428  . levofloxacin (LEVAQUIN) tablet 750 mg  750 mg Oral Daily PErick Colace NP   750 mg at 05/03/16 01017 . MEDLINE mouth rinse  15 mL Mouth Rinse BID CBarton Dubois MD   15 mL at 05/03/16 1000  . predniSONE (DELTASONE) tablet 40 mg  40 mg Oral Q breakfast [place] MD   40 mg at 05/03/16 1226  . sertraline (ZOLOFT) tablet 25 mg  25 mg Oral Daily CBarton Dubois MD   25 mg at 05/03/16 [phone] . sodium chloride flush (NS) 0.9 % injection 3 mL  3 mL Intravenous Q12H CBarton Dubois MD   3 mL at 05/03/16 0853  . sodium chloride flush (NS) 0.9 % injection 3 mL  3 mL Intravenous PRN CBarton Dubois MD      . temazepam (RESTORIL) capsule 15 mg  15 mg Oral QHS CBarton Dubois MD   15 mg at 05/02/16 2108     Discharge Medications: Please see discharge summary for a list of discharge medications.  Relevant Imaging Results:  Relevant Lab Results:   Additional Information SS # [id number] Anjelica Gorniak, JRandall An LCSW

## 2016-05-03 NOTE — Clinical Social Work Placement (Signed)
   CLINICAL SOCIAL WORK PLACEMENT  NOTE  Date:  05/03/2016  Patient Details  Name: Sheri Ayala MRN: [id number] Date of Birth: June 25, 1950  Clinical Social Work is seeking post-discharge placement for this patient at the [place] level of care (*CSW will initial, date and re-position this form in  chart as items are completed):      Patient/family provided with [place] Work Department's list of facilities offering this level of care within the geographic area requested by the patient (or if unable, by the patient's family).  Yes   Patient/family informed of their freedom to choose among providers that offer the needed level of care, that participate in Medicare, Medicaid or managed care program needed by the patient, have an available bed and are willing to accept the patient.  Yes   Patient/family informed of [place]'s ownership interest in [facility] and [facility], as well as of the fact that they are under no obligation to receive care at these facilities.  PASRR submitted to EDS on 05/03/16     PASRR number received on 05/03/16     Existing PASRR number confirmed on       FL2 transmitted to all facilities in geographic area requested by pt/family on 05/03/16     FL2 transmitted to all facilities within larger geographic area on       Patient informed that his/her managed care company has contracts with or will negotiate with certain facilities, including the following:            Patient/family informed of bed offers received.  Patient chooses bed at       Physician recommends and patient chooses bed at      Patient to be transferred to   on  .  Patient to be transferred to facility by       Patient family notified on   of transfer.  Name of family member notified:        PHYSICIAN       Additional Comment:    _______________________________________________ Loraine Maple  [phone] 05/03/2016, 3:43  PM

## 2016-05-04 ENCOUNTER — Inpatient Hospital Stay (HOSPITAL_COMMUNITY): Payer: Medicare HMO

## 2016-05-04 DIAGNOSIS — J90 Pleural effusion, not elsewhere classified: Secondary | ICD-10-CM

## 2016-05-04 DIAGNOSIS — Z9889 Other specified postprocedural states: Secondary | ICD-10-CM

## 2016-05-04 LAB — HEPARIN LEVEL (UNFRACTIONATED): HEPARIN UNFRACTIONATED: 0.42 [IU]/mL (ref 0.30–0.70)

## 2016-05-04 LAB — CBC
HCT: 32.9 % — ABNORMAL LOW (ref 36.0–46.0)
HEMOGLOBIN: 10.1 g/dL — AB (ref 12.0–15.0)
MCH: 30.3 pg (ref 26.0–34.0)
MCHC: 30.7 g/dL (ref 30.0–36.0)
MCV: 98.8 fL (ref 78.0–100.0)
Platelets: 175 10*3/uL (ref 150–400)
RBC: 3.33 MIL/uL — ABNORMAL LOW (ref 3.87–5.11)
RDW: 14.7 % (ref 11.5–15.5)
WBC: 23.7 10*3/uL — ABNORMAL HIGH (ref 4.0–10.5)

## 2016-05-04 LAB — PREALBUMIN: PREALBUMIN: 27.4 mg/dL (ref 18–38)

## 2016-05-04 MED ORDER — PREDNISONE 5 MG PO TABS
30.0000 mg | ORAL_TABLET | Freq: Every day | ORAL | Status: DC
  Administered 2016-05-05 – 2016-05-07 (×3): 30 mg via ORAL
  Filled 2016-05-04 (×3): qty 2

## 2016-05-04 MED ORDER — CYCLOBENZAPRINE HCL 5 MG PO TABS
5.0000 mg | ORAL_TABLET | Freq: Three times a day (TID) | ORAL | Status: DC
  Administered 2016-05-04 – 2016-05-07 (×9): 5 mg via ORAL
  Filled 2016-05-04 (×9): qty 1

## 2016-05-04 MED ORDER — HYDROCODONE-ACETAMINOPHEN 5-325 MG PO TABS
2.0000 | ORAL_TABLET | Freq: Once | ORAL | Status: AC
  Administered 2016-05-04: 2 via ORAL
  Filled 2016-05-04: qty 2

## 2016-05-04 MED ORDER — FUROSEMIDE 40 MG PO TABS
40.0000 mg | ORAL_TABLET | Freq: Every day | ORAL | Status: DC
  Administered 2016-05-04 – 2016-05-06 (×3): 40 mg via ORAL
  Filled 2016-05-04 (×4): qty 1

## 2016-05-04 MED ORDER — VITAMINS A & D EX OINT
TOPICAL_OINTMENT | CUTANEOUS | Status: AC
  Administered 2016-05-04: 09:00:00
  Filled 2016-05-04: qty 5

## 2016-05-04 MED ORDER — APIXABAN 5 MG PO TABS
5.0000 mg | ORAL_TABLET | Freq: Two times a day (BID) | ORAL | Status: DC
  Administered 2016-05-04 – 2016-05-07 (×6): 5 mg via ORAL
  Filled 2016-05-04 (×7): qty 1

## 2016-05-04 NOTE — Progress Notes (Addendum)
Physical Therapy Treatment Patient Details Name: Sheri Ayala MRN: [id number] DOB: Nov 12, 1950 Today's Date: 05/04/2016    History of Present Illness Sheri Ayala is a 66 y.o. female with PMH significant for COPD (on chronic oxygen supplementation, CLL, tobacco abuse , depression, anxiety, left lung mass and hx of CVA (w/o residual deficit); presented with worsening SOB, subjective fever and palpitations. patient CXR checked and found to have PNA; ECG- afib with RVR.Patient just recently admitted for bronchoscopy and biopsy of her left lung mass; subsequently developing COPD exacerbation and resp failure; s/p thoracentesis 05/03/16    PT Comments    Pt assisted with ambulating in hallway and only able to tolerate short distance.  Pt reports she plans to d/c home, feeling she will be back to baseline then however continue to recommend SNF at this time unless pt has 24/7 assist.  Pt states she lives with a roommate, however they would not assist her.   Follow Up Recommendations  SNF;Supervision/Assistance - 24 hour     Equipment Recommendations  None recommended by PT    Recommendations for Other Services       Precautions / Restrictions Precautions Precautions: Fall Precaution Comments: monitor oxygen, on 2L baseline Restrictions Weight Bearing Restrictions: No    Mobility  Bed Mobility Overal bed mobility: Independent                Transfers Overall transfer level: Needs assistance Equipment used: Rolling walker (2 wheeled) Transfers: Sit to/from Stand Sit to Stand: Min assist         General transfer comment: assist to rise and steady, verbal cues for hand placement  Ambulation/Gait Ambulation/Gait assistance: Min assist Ambulation Distance (Feet): 80 Feet Assistive device: Rolling walker (2 wheeled) Gait Pattern/deviations: Step-through pattern;Decreased stride length     General Gait Details: verbal cues for posture and RW position, distance to tolerance,  remained on 2L O2 [name]   Stairs            Wheelchair Mobility    Modified Rankin (Stroke Patients Only)       Balance                                    Cognition Arousal/Alertness: Awake/alert Behavior During Therapy: WFL for tasks assessed/performed Overall Cognitive Status: Within Functional Limits for tasks assessed                      Exercises      General Comments        Pertinent Vitals/Pain Pain Assessment: No/denies pain    Home Living                      Prior Function            PT Goals (current goals can now be found in the care plan section) Progress towards PT goals: Progressing toward goals    Frequency    Min 3X/week      PT Plan Current plan remains appropriate    Co-evaluation             End of Session Equipment Utilized During Treatment: Oxygen;Gait belt Activity Tolerance: Patient tolerated treatment well Patient left: in chair;with call bell/phone within reach     Time: 1012-1024 PT Time Calculation (min) (ACUTE ONLY): 12 min  Charges:  $Gait Training: 8-22 mins  G Codes:      Donya Tomaro,KATHrine E May 27, 2016, 11:52 AM Carmelia Bake, PT, DPT 27-May-2016 Pager: [phone]

## 2016-05-04 NOTE — Clinical Social Work Placement (Signed)
CSW provided SNF bed offers - patient expressed interest in [facility]. CSW confirmed with Florentina Jenny at [facility] that they would be able to take patient on Sunday. Insurance authorization obtained, Florentina Jenny made aware.    Please call weekend CSW (ph#: [phone]) to facilitate discharge.     Raynaldo Opitz, [facility] Clinical Social Worker cell #: [phone]    CLINICAL SOCIAL WORK PLACEMENT  NOTE  Date:  05/04/2016  Patient Details  Name: Sheri Ayala MRN: [id number] Date of Birth: Aug 15, 1950  Clinical Social Work is seeking post-discharge placement for this patient at the [place] level of care (*CSW will initial, date and re-position this form in  chart as items are completed):      Patient/family provided with [place] Work Department's list of facilities offering this level of care within the geographic area requested by the patient (or if unable, by the patient's family).  Yes   Patient/family informed of their freedom to choose among providers that offer the needed level of care, that participate in Medicare, Medicaid or managed care program needed by the patient, have an available bed and are willing to accept the patient.  Yes   Patient/family informed of [place]'s ownership interest in [facility] and [facility], as well as of the fact that they are under no obligation to receive care at these facilities.  PASRR submitted to EDS on 05/03/16     PASRR number received on 05/03/16     Existing PASRR number confirmed on       FL2 transmitted to all facilities in geographic area requested by pt/family on 05/03/16     FL2 transmitted to all facilities within larger geographic area on       Patient informed that his/her managed care company has contracts with or will negotiate with certain facilities, including the following:        Yes   Patient/family informed of bed offers received.  Patient  chooses bed at       Physician recommends and patient chooses bed at      Patient to be transferred to   on  .  Patient to be transferred to facility by       Patient family notified on   of transfer.  Name of family member notified:        PHYSICIAN       Additional Comment:    _______________________________________________ Standley Brooking, LCSW 05/04/2016, 3:07 PM

## 2016-05-04 NOTE — Progress Notes (Signed)
Nutrition Follow-up  DOCUMENTATION CODES:   Severe malnutrition in context of acute illness/injury  INTERVENTION:  Continue Ensure Enlive po BID, each supplement provides 350 kcal and 20 grams of protein.  Discussed high-calorie, high-protein drink patient can make to drink at home for at least one month (milk + dry milk powder + chocolate syrup) to prevent further weight loss.   NUTRITION DIAGNOSIS:   Inadequate oral intake related to acute illness, nausea, poor appetite as evidenced by per patient/family report.  Improving.  GOAL:   Patient will meet greater than or equal to 90% of their needs  Progressing.  MONITOR:   PO intake, Supplement acceptance, Weight trends, Labs, I & O's  REASON FOR ASSESSMENT:   Malnutrition Screening Tool    ASSESSMENT:   66 y.o. female with PMH significant for COPD (on chronic oxygen supplementation, CLL, tobacco abuse (quit 2 weeks ago), depression, anxiety, left lung mass and hx of CVA (w/o residual deficit); presented with worsening SOB, subjective fever and palpitations. Patient jsut recently admitted for bronchoscopy and biopsy of her left lung mass; subsequently developing COPD exacerbation and resp failure; patient discharge on bactrim and slow steroid tapering. She finish antibiotics and even doing poorly was waiting for her follow up appointment. about 24-48 hours prior to admission started having subjective fever, palpitations, coughing spells, left side chest discomfort with cough and generalized weakness.   Spoke with patient at bedside. She reports her appetite is much better. She is no longer having nausea. Also denies abdominal pain or constipation/diarrhea. Patient reports she enjoys the Ensure and has been drinking BID since ordered.   Meal Completion: 50-90%  Medications reviewed and include: Lasix 40 mg daily, Medline mouth rinse BID, prednisone 30 mg daily, heparin.   Labs reviewed (1/18): Glucose 125, BUN 33.   Diet  Order:  Diet Heart Room service appropriate? Yes; Fluid consistency: Thin  Skin:  Reviewed, no issues  Last BM:  PTA (unknown)  Height:   Ht Readings from Last 1 Encounters:  04/30/16 '5\' 10"'$  (1.778 m)    Weight:   Wt Readings from Last 1 Encounters:  05/04/16 145 lb 11.6 oz (66.1 kg)    Ideal Body Weight:  68.18 kg  BMI:  Body mass index is 20.91 kg/m.  Estimated Nutritional Needs:   Kcal:  1565-1750 (25-28 kcal/kg)  Protein:  70-80 grams (~1.1-1.3 grams/kg)  Fluid:  1.6-1.8 L/day  EDUCATION NEEDS:   No education needs identified at this time  Willey Blade, MS, RD, LDN Pager: [phone] After Hours Pager: [phone]

## 2016-05-04 NOTE — Progress Notes (Addendum)
[place] for Heparin Indication: atrial fibrillation  Allergies  Allergen Reactions  . Citrus     Burning in stomach   . Doxycycline     Caused pt daily headaches to be more severe   . Penicillins Hives, Nausea And Vomiting and Swelling    Patient reports tolerating ampicillin.  Has patient had a PCN reaction causing immediate rash, facial/tongue/throat swelling, SOB or lightheadedness with hypotension: Yes Has patient had a PCN reaction causing severe rash involving mucus membranes or skin necrosis: unknown Has patient had a PCN reaction that required hospitalization No Has patient had a PCN reaction occurring within the last 10 years: No If all of the above answers are "NO", then may proceed with Cephalosporin use.     Patient Measurements: Height: '5\' 10"'$  (177.8 cm) Weight: 145 lb 11.6 oz (66.1 kg) IBW/kg (Calculated) : 68.5 Heparin Dosing Weight: using total body weight of 62.6 kg  Vital Signs:    Labs:  Recent Labs  05/01/16 1400  05/02/16 0332  05/02/16 1911 05/03/16 0355 05/03/16 0901 05/04/16 0450  HGB  --   < > 10.7*  --   --  10.1*  --  10.1*  HCT  --   --  35.5*  --   --  33.7*  --  32.9*  PLT  --   --  191  --   --  175  --  175  LABPROT 17.7*  --   --   --   --   --   --   --   INR 1.44  --   --   --   --   --   --   --   HEPARINUNFRC  --   < >  --   < > 0.32 0.46  --  0.42  CREATININE  --   --  0.32*  --   --  0.52 0.53  --   < > = values in this interval not displayed.  Estimated Creatinine Clearance: 72.2 mL/min (by C-G formula based on SCr of 0.53 mg/dL).   Medical History: Past Medical History:  Diagnosis Date  . Allergy   . Anxiety   . Asthma   . Atrial fibrillation ([place]) 04/2016  . Bronchospasm   . Cerebrovascular accident (CVA) ([place]) 08/28/2015  . Chronic lymphocytic leukemia ([place]) 2004  . COPD (chronic obstructive pulmonary disease) ([place])   . Depression   . Emphysema of lung ([place])   . Frontal  sinusitis November 2012  . H/O viral encephalitis   . Headache(784.0)   . Heart murmur   . Mitral prolapse 11/24/2015  . Mitral regurgitation 11/24/2015  . Olecranon bursitis of left elbow November 2012.   in past  . Pleurisy   . Pneumonia    13 times  . Rhinitis   . Thrombocytopenia ([place])   . Tobacco abuse   . Tricuspid valve regurgitation 11/24/2015    Assessment: 71 yoF with h/o COPD and lung cancer, recent admission for PNA presents with shortness of breath. Found to be in afib with RVR. CHADS-VASc score 4 (age, gender, h/o CVA). Pharmacy consulted to start IV heparin infusion.  Significant events: - 1/18: heparin off 10a to 3p for thoracentesis   Today, 05/04/2016: - heparin level remains therapeutic at 0.42 - cbc relatively stable - no bleeding documented  Goal of Therapy:  Heparin level 0.3-0.7 units/ml Monitor platelets by anticoagulation protocol: Yes   Plan:  - continue heparin drip at  1500 units/hr - monitor for s/s bleeding  ______________________________  Adden (at 1:43PM): To transition to Eliquis today per Dr. Carles Collet - d/c heparin drip at 5PM and change to Eliquis 5 mg bid - pharmacy will sign off for Eliquis as dose is appr for age, weight and scr but will follow pt peripherally along with you  Dia Sitter, PharmD, BCPS 05/04/2016 11:22 AM

## 2016-05-04 NOTE — Progress Notes (Addendum)
PROGRESS NOTE  Deasiah I Madura PQZ:[id number] DOB: 1950/11/17 DOA: 04/30/2016 PCP: Alvester Chou, NP  Brief History:  66 y.o.with a history of COPD, CLL, tobacco abuse, depression, anxiety, left lung mass, history of CVA. She presented with dyspnea, subjective fever, palpitations. She is found to have runs a infection and initially presented in A. fib with RVR. 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 She was discharged to home 1/5.She states she actually began to feel worse pretty much the day after d/c. She reported increased nasal congestion, increased cough, progressive weakness and worsening shortness of breath. Also was having palpitations. This occurred in spite of pred taper and antibiotics which were continued after d/c. In the ER she was noted to be in AF w/ RVR, O2 sats were in the mid-80s, and CXR showed new RLL infiltrate. She was admitted to the medical service in the step-down setting   Assessment/Plan: Acute on chronic respiratory failure with hypoxia -Secondary to HCAP in setting of AECOPD -Wean O2 to home demand -Normally on 2 L nasal cannula at home -appreciate Pulm consult -continue xopenex, atrovent -continue solumedrol IV-->plan slow taper -Prednisone started 05/03/2016  HCAP -D/C aztreonam -Discontinue vancomycin -05/02/16--started cefepime (pt states she took ampicillin in past without problems)--questionable PCN allergy -05/03/16--switch to levoflox per pulm -RVP negative  Right pleural effusion -Appears transudative--suspect due to chronic hepatic congestion from cor pulmonale -05/03/2016-thoracocentesis--750 mL removed -culture neg to date -05/04/16--furosemide started -BMP in the morning -Daily weights -Repeat chest x-ray 05/05/2016  Chronic lymphocytic  leukemia Stable. Outpatient follow-up with oncology   Mass of left lung -Status post bronchoscopy and biopsy with pulmonology.  -Outpatient follow-up -need outpt PET scan -Will need repeat attempt at tissue sampling after acute infection resolved. Dr Melvyn Novas can arrange this in out-pt setting per pulmonary  Atrial fibrillation with rapid ventricular response--newly diagnosed--duration unclear -CHA2DS2-VASc Scoreis 4. -Appreciate Cardiology recommendations -continue diltiazem 30 mg po q 6 hrs-->diltiazem CD 120 mg-->rate controlled -2-D echocardiogram--EF 60-65%, moderate TR, no WMA, PASP 37 -TSH--0.310 -check free T4--1.05 --Discontinue IV heparin, start apixaban 05/04/2016  Depression Anxiety Insomnia -Continue when necessary Xanax -Continue Restoril -Continue Zoloft  Cervical strain -start flexeril -d/c toradol -heat pad  Euthyroid Sick Syndrome -TSH 0.310, Free T4 1.05 -repeat TSH in 4-5 weeks  Severe malnutrition -continue supplements   Disposition Plan:   SNF 1/21 if stable Family Communication: Son updated at bedside 05/04/16--Total time spent 35 minutes.  Greater than 50% spent face to face counseling and coordinating care.    Consultants: Cardiology, pulmonary  Code Status: FULL  DVT Prophylaxis: IVHeparin-->apixaban   Procedures: As Listed in Progress Note Above  Antibiotics: Levoflox 05/03/16>>> Cefepime 1/17>>>05/03/16 Aztreonam 1/15>>1/17 Vanc 1/15>>1/16   Subjective: Patient is breathing much better since thoracocentesis. Denies fevers, chills, chest pain, nausea, vomiting, diarrhea, abdominal pain. No dysuria or hematuria. No rashes.  Objective: Vitals:   05/04/16 0704 05/04/16 0913 05/04/16 0923 05/04/16 1427  BP:      Pulse:      Resp:      Temp:      TempSrc:      SpO2:  97% 97% 92%  Weight: 66.1 kg (145 lb 11.6 oz)     Height:        Intake/Output Summary (Last 24 hours) at 05/04/16  [place] filed at  05/04/16 0200  Gross per 24 hour  Intake              585 ml  Output              700 ml  Net             -115 ml   Weight change:  Exam:   General:  Pt is alert, follows commands appropriately, not in acute distress  HEENT: No icterus, No thrush, No neck mass, [place]/AT  Cardiovascular: RRR, S1/S2, no rubs, no gallops  Respiratory: Bibasilar crackles but no wheezing. Good air movement.   Abdomen: Soft/+BS, non tender, non distended, no guarding  Extremities: trace LE edema, No lymphangitis, No petechiae, No rashes, no synovitis   Data Reviewed: I have personally reviewed following labs and imaging studies Basic Metabolic Panel:  Recent Labs Lab 04/30/16 1405 04/30/16 2138 05/01/16 0328 05/02/16 0332 05/03/16 0355 05/03/16 0901  NA 139  --  139 137 138 139  K 4.1  --  4.2 4.7 4.8 5.0  CL 103  --  105 104 104 105  CO2 29  --  '27 29 28 28  '$ GLUCOSE 94  --  140* 140* 143* 125*  BUN 15  --  13 20 35* 33*  CREATININE 0.58  --  0.51 0.32* 0.52 0.53  CALCIUM 7.3*  --  7.4* 7.9* 8.3* 8.2*  MG  --  1.7  --   --   --   --    Liver Function Tests:  Recent Labs Lab 05/03/16 1403  PROT 4.9*   No results for input(s): LIPASE, AMYLASE in the last 168 hours. No results for input(s): AMMONIA in the last 168 hours. Coagulation Profile:  Recent Labs Lab 05/01/16 1400  INR 1.44   CBC:  Recent Labs Lab 04/30/16 1232 05/01/16 0328 05/02/16 0332 05/03/16 0355 05/04/16 0450  WBC 35.1* 38.1* 25.1* 24.8* 23.7*  HGB 12.6 11.6* 10.7* 10.1* 10.1*  HCT 38.7 38.4 35.5* 33.7* 32.9*  MCV 95.8 97.7 99.7 98.0 98.8  PLT 240 230 191 175 175   Cardiac Enzymes: No results for input(s): CKTOTAL, CKMB, CKMBINDEX, TROPONINI in the last 168 hours. BNP: Invalid input(s): POCBNP CBG: No results for input(s): GLUCAP in the last 168 hours. HbA1C: No results for input(s): HGBA1C in the last 72 hours. Urine analysis:    Component Value Date/Time   COLORURINE STRAW (A) 09/24/2015 1946    APPEARANCEUR CLEAR (A) 09/24/2015 1946   LABSPEC 1.010 09/24/2015 1946   PHURINE 5.0 09/24/2015 1946   GLUCOSEU NEGATIVE 09/24/2015 1946   HGBUR 1+ (A) 09/24/2015 1946   BILIRUBINUR NEGATIVE 09/24/2015 1946   KETONESUR NEGATIVE 09/24/2015 1946   PROTEINUR NEGATIVE 09/24/2015 1946   UROBILINOGEN 0.2 01/06/2015 1843   NITRITE NEGATIVE 09/24/2015 1946   LEUKOCYTESUR NEGATIVE 09/24/2015 1946   Sepsis Labs: '@LABRCNTIP'$ (procalcitonin:4,lacticidven:4) ) Recent Results (from the past 240 hour(s))  Blood culture (routine x 2)     Status: None (Preliminary result)   Collection Time: 04/30/16 12:35 PM  Result Value Ref Range Status   Specimen Description BLOOD BLOOD RIGHT FOREARM  Final   Special Requests BOTTLES DRAWN AEROBIC AND ANAEROBIC 5 CC  Final   Culture   Final    NO GROWTH 3 DAYS Performed at [facility], [place] [address], [place],  [address]    Report Status PENDING  Incomplete  Blood culture (routine x 2)     Status: None (  Preliminary result)   Collection Time: 04/30/16  2:49 PM  Result Value Ref Range Status   Specimen Description BLOOD RIGHT FOREARM  Final   Special Requests BOTTLES DRAWN AEROBIC AND ANAEROBIC 5 ML  Final   Culture   Final    NO GROWTH 3 DAYS Performed at [facility], [place] [address] [place], [place] [address]    Report Status PENDING  Incomplete  Culture, sputum-assessment     Status: None   Collection Time: 04/30/16  8:41 PM  Result Value Ref Range Status   Specimen Description EXPECTORATED SPUTUM  Final   Special Requests NONE  Final   Sputum evaluation THIS SPECIMEN IS ACCEPTABLE FOR SPUTUM CULTURE  Final   Report Status 04/30/2016 FINAL  Final  Urine culture     Status: None   Collection Time: 04/30/16  8:41 PM  Result Value Ref Range Status   Specimen Description URINE, RANDOM  Final   Special Requests NONE  Final   Culture   Final    NO GROWTH Performed at [facility], [place] [address] [place], [place] [address]     Report Status 05/02/2016 FINAL  Final  Culture, respiratory (NON-Expectorated)     Status: None   Collection Time: 04/30/16  8:41 PM  Result Value Ref Range Status   Specimen Description EXPECTORATED SPUTUM  Final   Special Requests NONE Reflexed from [id number]  Final   Gram Stain   Final    FEW WBC PRESENT, PREDOMINANTLY PMN RARE SQUAMOUS EPITHELIAL CELLS PRESENT FEW GRAM NEGATIVE COCCOBACILLI RARE GRAM POSITIVE COCCI IN PAIRS    Culture   Final    Consistent with normal respiratory flora. Performed at [facility], [place] [address] [place], [place] [address]    Report Status 05/03/2016 FINAL  Final  MRSA PCR Screening     Status: None   Collection Time: 04/30/16  8:43 PM  Result Value Ref Range Status   MRSA by PCR NEGATIVE NEGATIVE Final    Comment:        The GeneXpert MRSA Assay (FDA approved for NASAL specimens only), is one component of a comprehensive MRSA colonization surveillance program. It is not intended to diagnose MRSA infection nor to guide or monitor treatment for MRSA infections.   Respiratory Panel by PCR     Status: None   Collection Time: 05/01/16 11:22 AM  Result Value Ref Range Status   Adenovirus NOT DETECTED NOT DETECTED Final   Coronavirus 229E NOT DETECTED NOT DETECTED Final   Coronavirus HKU1 NOT DETECTED NOT DETECTED Final   Coronavirus NL63 NOT DETECTED NOT DETECTED Final   Coronavirus OC43 NOT DETECTED NOT DETECTED Final   Metapneumovirus NOT DETECTED NOT DETECTED Final   Rhinovirus / Enterovirus NOT DETECTED NOT DETECTED Final   Influenza A NOT DETECTED NOT DETECTED Final   Influenza B NOT DETECTED NOT DETECTED Final   Parainfluenza Virus 1 NOT DETECTED NOT DETECTED Final   Parainfluenza Virus 2 NOT DETECTED NOT DETECTED Final   Parainfluenza Virus 3 NOT DETECTED NOT DETECTED Final   Parainfluenza Virus 4 NOT DETECTED NOT DETECTED Final   Respiratory Syncytial Virus NOT DETECTED NOT DETECTED Final   Bordetella pertussis NOT  DETECTED NOT DETECTED Final   Chlamydophila pneumoniae NOT DETECTED NOT DETECTED Final   Mycoplasma pneumoniae NOT DETECTED NOT DETECTED Final    Comment: Performed at [facility], [place] [address] [place], [place] [address]  Body fluid culture  Status: None (Preliminary result)   Collection Time: 05/03/16 12:18 PM  Result Value Ref Range Status   Specimen Description THORACENTESIS  Final   Special Requests NONE  Final   Gram Stain   Final    ABUNDANT WBC PRESENT,BOTH PMN AND MONONUCLEAR NO ORGANISMS SEEN    Culture   Final    NO GROWTH < 24 HOURS Performed at [facility], [place] [address], [place], [place] [address]    Report Status PENDING  Incomplete     Scheduled Meds: . apixaban  5 mg Oral BID  . budesonide (PULMICORT) nebulizer solution  0.5 mg Nebulization BID  . cyclobenzaprine  5 mg Oral TID  . diltiazem  120 mg Oral Daily  . feeding supplement (ENSURE ENLIVE)  237 mL Oral BID BM  . furosemide  40 mg Oral Daily  . ipratropium  0.5 mg Nebulization TID  . levalbuterol  0.63 mg Nebulization TID  . levofloxacin  750 mg Oral Daily  . mouth rinse  15 mL Mouth Rinse BID  . [START ON 05/05/2016] predniSONE  30 mg Oral Q breakfast  . sertraline  25 mg Oral Daily  . sodium chloride flush  3 mL Intravenous Q12H  . temazepam  15 mg Oral QHS   Continuous Infusions: . heparin 1,500 Units/hr (05/03/16 2358)    Procedures/Studies: Dg Chest Port 1 View  Result Date: 05/04/2016 CLINICAL DATA:  Shortness of breath, pleural effusion, followup, history COPD EXAM: PORTABLE CHEST 1 VIEW COMPARISON:  Portable exam 0913 hours compared 05/03/2016 FINDINGS: Upper normal heart size. Slight pulmonary vascular congestion. Mediastinal contour stable. Mild LEFT basilar atelectasis with persistent atelectasis and pleural effusion at the RIGHT lung base, slightly increased. Underlying emphysematous and bronchitic changes. No pneumothorax. Bones demineralized. IMPRESSION: COPD changes  with increased RIGHT basilar atelectasis and small pleural effusion as well as persistent mild subsegmental atelectasis at LEFT base. Electronically Signed   By: Lavonia Dana M.D.   On: 05/04/2016 09:34   Dg Chest Port 1 View  Result Date: 05/03/2016 CLINICAL DATA:  Status post right thoracentesis. EXAM: PORTABLE CHEST 1 VIEW COMPARISON:  05/03/2016 at 9 a.m. FINDINGS: There is decreased opacity at the right lung base consistent with the interval removal of pleural fluid. Residual opacity is consistent with combination of atelectasis or pneumonia with a small residual pleural effusion. There is no pneumothorax. Reticular and linear opacity at the left lung base, likely combination scarring atelectasis, stable. There stable apical pleuroparenchymal scarring. IMPRESSION: 1. No evidence of a complication following right thoracentesis. No pneumothorax. 2. Significant decrease in right pleural fluid noted within overall decrease right basilar opacity. 3. No other change. Electronically Signed   By: Lajean Manes M.D.   On: 05/03/2016 12:39   Dg Chest Port 1 View  Result Date: 05/03/2016 CLINICAL DATA:  Follow-up pneumonia EXAM: PORTABLE CHEST 1 VIEW COMPARISON:  04/30/2016 FINDINGS: Cardiomediastinal silhouette is stable. Worsening infiltrate/ pneumonia in right lower lobe with small right pleural effusion. Persistent streaky left basilar atelectasis or infiltrate. No pulmonary edema. IMPRESSION: Worsening infiltrate/ pneumonia in right lower lobe with small right pleural effusion. Persistent streaky left basilar atelectasis or infiltrate. No pulmonary edema. Electronically Signed   By: Lahoma Crocker M.D.   On: 05/03/2016 09:49   Dg Chest Portable 1 View  Result Date: 04/30/2016 CLINICAL DATA:  Shortness of breath and chest pain today. Productive cough, nausea and fever. EXAM: PORTABLE CHEST 1 VIEW COMPARISON:  Single-view of the chest 04/18/2016. CT chest 03/27/2016. FINDINGS:  The lungs are emphysematous. There  is right worse than left basilar airspace disease. Heart size is normal. No pneumothorax or pleural effusion. IMPRESSION: Right worse left basilar airspace disease has an appearance most consistent with pneumonia. Emphysema. Electronically Signed   By: Inge Rise M.D.   On: 04/30/2016 12:19   Dg Chest Port 1 View  Result Date: 04/18/2016 CLINICAL DATA:  Bronchoscopy.  Left perihilar mass EXAM: PORTABLE CHEST 1 VIEW COMPARISON:  CT 03/27/2016. FINDINGS: Mediastinum is stable. Persistent left perihilar fullness consistent with previously identified mass. Lungs are otherwise clear. No pleural effusion or pneumothorax. Biapical pleural thickening noted consistent with scarring. IMPRESSION: Persistent left perihilar fullness consistent with previously identified perihilar mass. Electronically Signed   By: Marcello Moores  Register   On: 04/18/2016 11:13    Buena Boehm, DO  [facility] Pager [phone]  If 7PM-7AM, please contact night-coverage [email or web address] Password [email or web address] 05/04/2016, 2:39 PM   LOS: 4 days

## 2016-05-04 NOTE — Care Management Important Message (Signed)
Important Message  Patient Details  Name: Sheri Ayala MRN: [id number] Date of Birth: [birth year]   Medicare Important Message Given:  Yes    Kerin Salen 05/04/2016, 2:08 [place] Message  Patient Details  Name: Sheri Ayala MRN: [id number] Date of Birth: March 07, 1951   Medicare Important Message Given:  Yes    Kerin Salen 05/04/2016, 2:08 PM

## 2016-05-04 NOTE — Progress Notes (Signed)
Name: Sheri Ayala MRN:   [id number] DOB:   1951-01-02           ADMISSION DATE:  04/30/2016 CONSULTATION DATE:  1/16  REFERRING MD :  Lonny Prude   CHIEF COMPLAINT:  Acute on chronic respiratory failure   BRIEF PATIENT DESCRIPTION:  65 year old female followed by Dr Melvyn Novas. 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 Re-admitted 1/15 w/ HCAP  SIGNIFICANT EVENTS  1/4 discharged s/p non-diagnostic FOB of LLL  1/16 re-admitted w/ HCAP  1/18 right thora w/ 750 ml/transudate  STUDIES:  sputum 1/18 few gnc coccobacilli and rare gpc pairs. RVP negative   Site: right Date: 1/18  Pleural fluid Serum   LDH: 36 LDH: 127  Protein: <3 Protein: 4.9  Cholesterol:  Cholesterol:  Afb:   Gm stain:    Culture:abundant wbc; no orgs>>>   Fungal:    Hct:    Color: straw colored/clear   Wbc: 617   Neutrophils: 58   Lymph: 24   Ph:    Rheumatoid factor:   Adenosine Deaminase:   Glucose: 136   Cytology:       Special notes:  744m    Subjective Feeling better Objective BP (!) 123/58 (BP Location: Left Arm)   Pulse 79   Temp 97.8 F (36.6 C) (Oral)   Resp 19   Ht '5\' 10"'$  (1.778 m)   Wt 145 lb 11.6 oz (66.1 kg)   SpO2 93%   BMI 20.91 kg/m   On 2 liters [place]  Intake/Output Summary (Last 24 hours) at 05/04/16 0917 Last data filed at 05/04/16 0200  Gross per 24 hour  Intake              945 ml  Output             1300 ml  Net             -355 ml   General appearance:  66Year old  Female, NAD, conversant  Eyes: anicteric, moist conjunctivae; PERRL, EOMI bilaterally. Mouth:  membranes and no mucosal ulcerations; normal hard and soft palate Neck: Trachea midline; neck supple, no JVD Lungs/chest: normal respiratory effort and no intercostal retractions, no wheeze. Crackles right posterior base  CV:  regular irreg , no MRGs  Abdomen: Soft, non-tender; no masses or HSM Extremities: No peripheral edema or extremity lymphadenopathy Skin: Normal temperature, turgor and texture; no rash, ulcers or subcutaneous nodules Psych: Appropriate affect, alert and oriented to person, place and time  CBC Recent Labs     05/02/16  0332  05/03/16  0355  05/04/16  0450  WBC  25.1*  24.8*  23.7*  HGB  10.7*  10.1*  10.1*  HCT  35.5*  33.7*  32.9*  PLT  191  175  175    Coag's Recent Labs     05/01/16  1400  INR  1.44    BMET Recent Labs     05/02/16  0332  05/03/16  0355  05/03/16  0901  NA  137  138  139  K  4.7  4.8  5.0  CL  104  104  105  CO2  '29  28  28  '$ BUN  20  35*  33*  CREATININE  0.32*  0.52  0.53  GLUCOSE  140*  143*  125*    Electrolytes Recent Labs     05/02/16  0332  05/03/16  0355  05/03/16  0901  CALCIUM  7.9*  8.3*  8.2*    Sepsis Markers No results for input(s): PROCALCITON, O2SATVEN in the last 72 hours.  Invalid input(s): LACTICACIDVEN  ABG No results for input(s): PHART, PCO2ART, PO2ART in the last 72 hours.  Liver Enzymes No results for input(s): AST, ALT, ALKPHOS, BILITOT, ALBUMIN in the last 72 hours.  Cardiac Enzymes No results for input(s): TROPONINI, PROBNP in the last 72 hours.  Glucose No results for input(s): GLUCAP in the last 72 hours.  Imaging Dg Chest Port 1 View  Result Date: 05/03/2016 CLINICAL DATA:  Status post right thoracentesis. EXAM: PORTABLE CHEST 1 VIEW COMPARISON:  05/03/2016 at 9 a.m. FINDINGS: There is decreased opacity at the right lung base consistent with the interval removal of pleural fluid. Residual opacity is consistent with combination of atelectasis or pneumonia with a small residual pleural effusion. There is no pneumothorax. Reticular and linear opacity at the left lung base, likely combination scarring atelectasis, stable. There stable apical pleuroparenchymal scarring. IMPRESSION: 1. No evidence of a  complication following right thoracentesis. No pneumothorax. 2. Significant decrease in right pleural fluid noted within overall decrease right basilar opacity. 3. No other change. Electronically Signed   By: Lajean Manes M.D.   On: 05/03/2016 12:39   Dg Chest Port 1 View  Result Date: 05/03/2016 CLINICAL DATA:  Follow-up pneumonia EXAM: PORTABLE CHEST 1 VIEW COMPARISON:  04/30/2016 FINDINGS: Cardiomediastinal silhouette is stable. Worsening infiltrate/ pneumonia in right lower lobe with small right pleural effusion. Persistent streaky left basilar atelectasis or infiltrate. No pulmonary edema. IMPRESSION: Worsening infiltrate/ pneumonia in right lower lobe with small right pleural effusion. Persistent streaky left basilar atelectasis or infiltrate. No pulmonary edema. Electronically Signed   By: Lahoma Crocker M.D.   On: 05/03/2016 09:49   LLL ATX. Slight reoccurrence of right effusion   Impression/plan  Acute on chronic hypoxic/hypercarbic respiratory failure in setting of HCAP and AECOPD. Plan Complete 8d levaquin  Wean pred over 2 weeks (just decreased to '30mg'$ ) Cont bds Resume symbicort at dc Resume O2 at discharge  Transudative right pleural effusion w/ some reoccurrence.. -->? Low oncotic pressure vs r/t AF?  Plan Ck prealbumin  Add low dose lasix; f/u am chemistry watching K Repeat CXR Sunday 1/21  LLL lung mass Plan F/u our office  Needs PET scan f/u (being arranged) and needs repeat tissue sampling.    All other issues: AF, CLL, depression & deconditioning-->per primary service and cards.   We will be available PRN over the WE. Our office will call w/ f/u appointment. Have started lasix. Will need to decide on home dosing. ? EOD vs daily depending on creatinine. Have ordered repeat CXR for Sunday. If she is still here Monday we will see her then. Otherwise we will see her in the office   Erick Colace ACNP-BC [place] Pager # [phone] OR # [phone]  if no answer    ATTENDING NOTE / ATTESTATION NOTE :   I have discussed the case with the resident/APP  Marni Griffon NP.   I agree with the resident/APP's  history, physical examination, assessment, and plans.    I have edited the above note and modified it according to our agreed history, physical examination, assessment and plan.   Briefly, 66 year old female, previous smoker, diagnosed recently with a left lower lobe mass, had  bronchoscopy and biopsy on 04/18/2016. She had desaturation during the bronchoscopy which prompted her admission. She was discharged on Bactrim and prednisone taper and oxygen 2 L when necessary. She slowly worsened as far as her dyspnea was concerned, associated with cough, generalized weakness, palpitations. She presented to the emergency room. Chest x-ray showed now a right lower lobe pneumonia which was not seen on previous chest x-ray. She was admitted in the stepdown unit for healthcare associated pneumonia as well as acute exacerbation of COPD. She went into rapid A. fib on admission and is currently in SR. She is significantly improved since admission. Less SOB and wheeze.  We did thoracentesis on 1/18 and fluid was transudative. Clinically improved, almost at baseline.   At present, she feels better compared to when she came in. Not in distress. Blood pressure 90/60, heart rate 80s,  respiratory 20, 96% saturation on 2L nasal cannula. Temperature 97.3Fahrenheit. NAD. No oral thrush. No neck vein distention. No intercostal retraction. Significantly decreased wheezing in BULF. No rhonchi. Good S1 and S2. No S3. No rub or gallop. Abdomen was benign. Extremities with negative edema clubbing cyanosis. Skin was warm and dry.  Labs reviewed. Creatinine was 0.53. WBC was 24. Cultures (-) so far. RVP was (-).   Assessment/Plan: 1. Healthcare associated pneumonia, right lower lung. Clinically better. CXR on 1/18 with worse infiltrate and pleural effusion. S/P  thoracentesis on 1/18 with transudative effusion.  - pt is clinically improved. Almost at baseline.  - cont PO Levofloxacin. Plan for at least 7-10 days PO Levaquin.  She needs to be seen in a week or so after d/c with rpt CXR to f/u on PNA at the [facility] office.   2. AECOPD. Improved.  - Cont Pulmicort nebulizer twice a day.  - Continue Atrovent 4 times a day and Xopenex 4x/day.  - IV steroids were d/c'd on 1/18.  - prednisone taper over 10-14 days.  - she needs to be discharged on Symbicort 160/4.5 2P BID on d/c. She does NOT have this medicine. Need to prescribe on d/c. Cont alb MDI prn on d/c.   3. Lung Mass in LLL - Needs PET scan on d/c - Needs Rpt Bronchoscopy but with EBUS this time on f/u.  - needs f/u with pulm on d/c   4. Hypoxemia - cont O2 to keep sats > 88% - she is on 2L O2 at baseline.   5. Atrial Fibrillation.  - Cardiology following. On heparin drip.   Family :No family at bedside. Updated pt of plan.  I anticipate she'll be discharged over the weekend. She needs follow up with pulmonary as mentioned, hopefully in a week.  PCCM will sign off for now. Call back if with issues.    Monica Becton, MD 05/04/2016, 11:26 AM [facility] Pager [phone] After 3 pm or if no answer, call [phone]

## 2016-05-04 NOTE — Discharge Instructions (Signed)

## 2016-05-05 DIAGNOSIS — E43 Unspecified severe protein-calorie malnutrition: Secondary | ICD-10-CM

## 2016-05-05 LAB — CBC
HCT: 33.5 % — ABNORMAL LOW (ref 36.0–46.0)
HEMOGLOBIN: 10.3 g/dL — AB (ref 12.0–15.0)
MCH: 29.7 pg (ref 26.0–34.0)
MCHC: 30.7 g/dL (ref 30.0–36.0)
MCV: 96.5 fL (ref 78.0–100.0)
Platelets: 168 10*3/uL (ref 150–400)
RBC: 3.47 MIL/uL — AB (ref 3.87–5.11)
RDW: 14.3 % (ref 11.5–15.5)
WBC: 19.3 10*3/uL — ABNORMAL HIGH (ref 4.0–10.5)

## 2016-05-05 LAB — CULTURE, BLOOD (ROUTINE X 2)
Culture: NO GROWTH
Culture: NO GROWTH

## 2016-05-05 LAB — BASIC METABOLIC PANEL
ANION GAP: 5 (ref 5–15)
BUN: 34 mg/dL — ABNORMAL HIGH (ref 6–20)
CALCIUM: 8.1 mg/dL — AB (ref 8.9–10.3)
CO2: 36 mmol/L — ABNORMAL HIGH (ref 22–32)
CREATININE: 0.54 mg/dL (ref 0.44–1.00)
Chloride: 95 mmol/L — ABNORMAL LOW (ref 101–111)
GFR calc Af Amer: >60 mL/min (ref >60)
Glucose, Bld: 104 mg/dL — ABNORMAL HIGH (ref 65–99)
Potassium: 5.1 mmol/L (ref 3.5–5.1)
Sodium: 136 mmol/L (ref 135–145)

## 2016-05-05 MED ORDER — LEVOFLOXACIN 750 MG PO TABS: 750.0000 mg | ORAL_TABLET | Freq: Every day | ORAL | 0 refills | Status: DC

## 2016-05-05 MED ORDER — TEMAZEPAM 30 MG PO CAPS: 30.0000 mg | ORAL_CAPSULE | Freq: Every day | ORAL | 0 refills | Status: DC

## 2016-05-05 MED ORDER — CYCLOBENZAPRINE HCL 5 MG PO TABS: 5.0000 mg | ORAL_TABLET | Freq: Three times a day (TID) | ORAL | 0 refills | Status: DC

## 2016-05-05 MED ORDER — APIXABAN 5 MG PO TABS: 5.0000 mg | ORAL_TABLET | Freq: Two times a day (BID) | ORAL | 0 refills | Status: DC

## 2016-05-05 MED ORDER — FUROSEMIDE 40 MG PO TABS: 40.0000 mg | ORAL_TABLET | Freq: Every day | ORAL | 0 refills | Status: DC

## 2016-05-05 MED ORDER — LIP MEDEX EX OINT
TOPICAL_OINTMENT | CUTANEOUS | Status: DC | PRN
  Filled 2016-05-05: qty 7

## 2016-05-05 MED ORDER — ENSURE ENLIVE PO LIQD: 237.0000 mL | Freq: Two times a day (BID) | ORAL | 0 refills | Status: DC

## 2016-05-05 MED ORDER — PREDNISONE 10 MG PO TABS: ORAL_TABLET | ORAL | 8 refills | Status: DC

## 2016-05-05 MED ORDER — DILTIAZEM HCL ER COATED BEADS 120 MG PO CP24: 120.0000 mg | ORAL_CAPSULE | Freq: Every day | ORAL | 0 refills | Status: DC

## 2016-05-05 MED ORDER — ALPRAZOLAM 0.5 MG PO TABS: 0.5000 mg | ORAL_TABLET | Freq: Two times a day (BID) | ORAL | 0 refills | Status: DC | PRN

## 2016-05-05 MED ORDER — SODIUM POLYSTYRENE SULFONATE 15 GM/60ML PO SUSP
30.0000 g | Freq: Once | ORAL | Status: AC
  Administered 2016-05-05: 30 g via ORAL
  Filled 2016-05-05: qty 120

## 2016-05-05 NOTE — Discharge Summary (Addendum)
Physician Discharge Summary  Sheri Ayala JHE:[id number] DOB: 03/06/1951 DOA: 04/30/2016  PCP: Alvester Chou, NP  Admit date: 04/30/2016 Discharge date: 05/07/16  Admitted From: Home Disposition:  SNF  Recommendations for Outpatient Follow-up:  1. Follow up with PCP in 1-2 weeks 2. Please obtain BMP/CBC in one week 3. Please keep patient on 2L nasal cannula     Discharge Condition: Stable CODE STATUS:FULL Diet recommendation: Heart Healthy   Brief/Interim Summary: 66 y.o.with a history of COPD, CLL, tobacco abuse, depression, anxiety, left lung mass, history of CVA. She presented with dyspnea, subjective fever, palpitations. She is found to have runs a infection and initially presented in A. fib with RVR. 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 She was discharged to home 1/5.She states she actually began to feel worse pretty much the day after d/c. She reported increased nasal congestion, increased cough, progressive weakness and worsening shortness of breath. Also was having palpitations. This occurred in spite of pred taper and antibiotics which were continued after d/c. In the ER she was noted to be in AF w/ RVR, O2 sats were in the mid-80s, and CXR showed new RLL infiltrate. She was admitted to the medical service in the step-down setting.The patient was started on bronchodilators, antibiotics and intravenous steroids. The patient showed clinical improvement, albeit slow. Pulmonary medicine and cardiology was consulted to assist with management.  Discharge Diagnoses:  Acute on chronic respiratory failure with hypoxia -Secondary to HCAP in setting of AECOPD -Wean O2 to home demand -Normally on 2 L nasal cannula at home -appreciate Pulm consult -continue xopenex, atrovent -continue solumedrol  IV-->plan slow taper over next 9 days after d/c -Prednisone started 05/03/2016  HCAP -D/C aztreonam -Discontinue vancomycin -05/02/16--startedcefepime (pt states she took ampicillin in past without problems)--questionablePCN allergy -05/03/16--switch to levoflox per pulm -3 more days levoflox after discharge to complete 10 days of abx total per pulm recommendations -RVP negative -d/c home with symbicort and albuterol MDI  Right pleural effusion -Appears transudative--suspect due to chronic hepatic congestion from cor pulmonale -05/03/2016-thoracocentesis--750 mL removed -culture neg to date -05/04/16--furosemide started-->renal function remained stable -Daily weights-->discharge weight 127 -Repeat chest x-ray 05/06/2016--slight increase in R-pleural effusion -05/07/16 CXR--pleural effusion--a little improvement -seen by pulm on 1/22--decrease lasix to 40 mg po q 48 hours  Chronic lymphocytic leukemia Stable. Outpatient follow-up with oncology   Mass of left lung -Status post bronchoscopy and biopsy with pulmonology.  -Outpatient follow-up -need outpt PET scan -Will need repeat attempt at tissue sampling after acute infection resolved. Dr Melvyn Novas can arrange this in out-pt setting per pulmonary  Atrial fibrillation with rapid ventricular response--newly diagnosed--duration unclear -CHA2DS2-VASc Scoreis 4. -Appreciate Cardiology recommendations -continue diltiazem 30 mg po q 6 hrs-->diltiazem CD 120 mg-->increased to diltiazem CD 180 mg daily as pt became tachycardic with ambulation -2-D echocardiogram--EF 60-65%, moderate TR, no WMA, PASP 37 -TSH--0.310 -check free T4--1.05 --Discontinue IV heparin, started apixaban 05/04/2016  Depression Anxiety Insomnia -Continue when necessary Xanax -Continue Restoril -Continue Zoloft  Cervical strain -started flexeril x 1 week -d/c toradol -heat pad  Euthyroid Sick Syndrome -TSH 0.310, Free T4 1.05 -repeat TSH in 4-5  weeks  Severe malnutrition -continue supplements  F/E/N -1/20--kayexalate x 1 as K is trending up -am BMP   Discharge Instructions  Discharge Instructions    Diet - low sodium heart  healthy    Complete by:  As directed    Increase activity slowly    Complete by:  As directed      Allergies as of 05/07/2016      Reactions   Citrus    Burning in stomach    Doxycycline    Caused pt daily headaches to be more severe    Penicillins Hives, Nausea And Vomiting, Swelling   Patient reports tolerating ampicillin. Has patient had a PCN reaction causing immediate rash, facial/tongue/throat swelling, SOB or lightheadedness with hypotension: Yes Has patient had a PCN reaction causing severe rash involving mucus membranes or skin necrosis: unknown Has patient had a PCN reaction that required hospitalization No Has patient had a PCN reaction occurring within the last 10 years: No If all of the above answers are "NO", then may proceed with Cephalosporin use.      Medication List    STOP taking these medications   metoprolol succinate 25 MG 24 hr tablet Commonly known as:  TOPROL-XL   predniSONE 10 MG (21) Tbpk tablet Commonly known as:  STERAPRED UNI-PAK 21 TAB Replaced by:  predniSONE 10 MG tablet     TAKE these medications   albuterol (2.5 MG/3ML) 0.083% nebulizer solution Commonly known as:  PROVENTIL Take 3 mLs (2.5 mg total) by nebulization every 4 (four) hours as needed for wheezing or shortness of breath.   VENTOLIN HFA 108 (90 Base) MCG/ACT inhaler Generic drug:  albuterol Inhale 2 puffs into the lungs every 6 (six) hours as needed for shortness of breath.   ALPRAZolam 0.5 MG tablet Commonly known as:  XANAX Take 1 tablet (0.5 mg total) by mouth 2 (two) times daily as needed for anxiety or sleep.   apixaban 5 MG Tabs tablet Commonly known as:  ELIQUIS Take 1 tablet (5 mg total) by mouth 2 (two) times daily.   budesonide-formoterol 160-4.5 MCG/ACT  inhaler Commonly known as:  SYMBICORT Take 2 puffs first thing in am and then another 2 puffs about 12 hours later.   cyclobenzaprine 5 MG tablet Commonly known as:  FLEXERIL Take 1 tablet (5 mg total) by mouth 3 (three) times daily.   diltiazem 180 MG 24 hr capsule Commonly known as:  CARDIZEM CD Take 1 capsule (180 mg total) by mouth daily. Start taking on:  05/08/2016   feeding supplement (ENSURE ENLIVE) Liqd Take 237 mLs by mouth 2 (two) times daily between meals.   furosemide 40 MG tablet Commonly known as:  LASIX Take 1 tablet (40 mg total) by mouth every other day. Start taking on:  05/08/2016   levofloxacin 750 MG tablet Commonly known as:  LEVAQUIN Take 1 tablet (750 mg total) by mouth daily. X 3 days   predniSONE 10 MG tablet Commonly known as:  DELTASONE Take 30 mg (3 tabs) daily x 3 days, then 20 mg (2 tabs) daily x 3 days, then 10 mg (1 tab) daily x 3 days Replaces:  predniSONE 10 MG (21) Tbpk tablet   sertraline 25 MG tablet Commonly known as:  ZOLOFT Take 25 mg by mouth daily.   temazepam 30 MG capsule Commonly known as:  RESTORIL Take 1 capsule (30 mg total) by mouth at bedtime.       Contact information for follow-up providers    [facility] Follow up.   Specialty:  Pulmonology Why:  our office will contact you with an appointment  Contact information: [place]  Kirk Ruths, MD Follow up.   Specialty:  Cardiology Why:  [place] will contact you with an appointment Contact information: [place] [phone]            Contact information for after-discharge care    Destination    [facility] .   Specialty:  [place] information: [address] [place] [phone]                 Allergies  Allergen Reactions  . Citrus     Burning in stomach   .  Doxycycline     Caused pt daily headaches to be more severe   . Penicillins Hives, Nausea And Vomiting and Swelling    Patient reports tolerating ampicillin.  Has patient had a PCN reaction causing immediate rash, facial/tongue/throat swelling, SOB or lightheadedness with hypotension: Yes Has patient had a PCN reaction causing severe rash involving mucus membranes or skin necrosis: unknown Has patient had a PCN reaction that required hospitalization No Has patient had a PCN reaction occurring within the last 10 years: No If all of the above answers are "NO", then may proceed with Cephalosporin use.     Consultations:  Pulmonary medicine  cardiology   Procedures/Studies: Dg Chest Port 1 View  Result Date: 05/07/2016 CLINICAL DATA:  Pleural effusion. EXAM: PORTABLE CHEST 1 VIEW COMPARISON:  05/06/2016. FINDINGS: Mediastinum hilar structures normal. Cardiomegaly with normal pulmonary vascularity. Low lung volumes. Persistent right lower lobe infiltrate with right pleural effusion. Slight improvement from prior exam. Biapical pleural thickening again noted consistent with scarring. IMPRESSION: Persistent right lower lobe infiltrate with small right pleural effusion. Slight improvement from prior exam . Electronically Signed   By: Marcello Moores  Register   On: 05/07/2016 07:02   Dg Chest Port 1 View  Result Date: 05/06/2016 CLINICAL DATA:  Follow-up pleural effusion. EXAM: PORTABLE CHEST 1 VIEW COMPARISON:  05/04/2016 FINDINGS: Unchanged heart size and mediastinal contours. Right pleural effusion with mild increase from prior, associated right basilar opacity again seen. Pulmonary to the congestion and bronchial thickening, stable. Subsegmental atelectasis at the left lung base is unchanged. No pneumothorax. IMPRESSION: Slight increase in right pleural effusion. Unchanged vascular congestion. Electronically Signed   By: Jeb Levering M.D.   On: 05/06/2016 05:35   Dg Chest Port 1 View  Result  Date: 05/04/2016 CLINICAL DATA:  Shortness of breath, pleural effusion, followup, history COPD EXAM: PORTABLE CHEST 1 VIEW COMPARISON:  Portable exam 0913 hours compared 05/03/2016 FINDINGS: Upper normal heart size. Slight pulmonary vascular congestion. Mediastinal contour stable. Mild LEFT basilar atelectasis with persistent atelectasis and pleural effusion at the RIGHT lung base, slightly increased. Underlying emphysematous and bronchitic changes. No pneumothorax. Bones demineralized. IMPRESSION: COPD changes with increased RIGHT basilar atelectasis and small pleural effusion as well as persistent mild subsegmental atelectasis at LEFT base. Electronically Signed   By: Lavonia Dana M.D.   On: 05/04/2016 09:34   Dg Chest Port 1 View  Result Date: 05/03/2016 CLINICAL DATA:  Status post right thoracentesis. EXAM: PORTABLE CHEST 1 VIEW COMPARISON:  05/03/2016 at 9 a.m. FINDINGS: There is decreased opacity at the right lung base consistent with the interval removal of pleural fluid. Residual opacity is consistent with combination of atelectasis or pneumonia with a small residual pleural effusion. There is no pneumothorax. Reticular and linear opacity at the left lung base, likely combination scarring atelectasis, stable. There stable apical pleuroparenchymal scarring. IMPRESSION: 1. No evidence of  a complication following right thoracentesis. No pneumothorax. 2. Significant decrease in right pleural fluid noted within overall decrease right basilar opacity. 3. No other change. Electronically Signed   By: Lajean Manes M.D.   On: 05/03/2016 12:39   Dg Chest Port 1 View  Result Date: 05/03/2016 CLINICAL DATA:  Follow-up pneumonia EXAM: PORTABLE CHEST 1 VIEW COMPARISON:  04/30/2016 FINDINGS: Cardiomediastinal silhouette is stable. Worsening infiltrate/ pneumonia in right lower lobe with small right pleural effusion. Persistent streaky left basilar atelectasis or infiltrate. No pulmonary edema. IMPRESSION: Worsening  infiltrate/ pneumonia in right lower lobe with small right pleural effusion. Persistent streaky left basilar atelectasis or infiltrate. No pulmonary edema. Electronically Signed   By: Lahoma Crocker M.D.   On: 05/03/2016 09:49   Dg Chest Portable 1 View  Result Date: 04/30/2016 CLINICAL DATA:  Shortness of breath and chest pain today. Productive cough, nausea and fever. EXAM: PORTABLE CHEST 1 VIEW COMPARISON:  Single-view of the chest 04/18/2016. CT chest 03/27/2016. FINDINGS: The lungs are emphysematous. There is right worse than left basilar airspace disease. Heart size is normal. No pneumothorax or pleural effusion. IMPRESSION: Right worse left basilar airspace disease has an appearance most consistent with pneumonia. Emphysema. Electronically Signed   By: Inge Rise M.D.   On: 04/30/2016 12:19   Dg Chest Port 1 View  Result Date: 04/18/2016 CLINICAL DATA:  Bronchoscopy.  Left perihilar mass EXAM: PORTABLE CHEST 1 VIEW COMPARISON:  CT 03/27/2016. FINDINGS: Mediastinum is stable. Persistent left perihilar fullness consistent with previously identified mass. Lungs are otherwise clear. No pleural effusion or pneumothorax. Biapical pleural thickening noted consistent with scarring. IMPRESSION: Persistent left perihilar fullness consistent with previously identified perihilar mass. Electronically Signed   By: Marcello Moores  Register   On: 04/18/2016 11:13        Discharge Exam: Vitals:   05/07/16 1202 05/07/16 1215  BP:    Pulse: (!) 164 (!) 105  Resp:    Temp:     Vitals:   05/07/16 1059 05/07/16 1106 05/07/16 1202 05/07/16 1215  BP:      Pulse:   (!) 164 (!) 105  Resp:      Temp:      TempSrc:      SpO2: 97% 97%    Weight:      Height:        General: Pt is alert, awake, not in acute distress Cardiovascular: IRRR, S1/S2 +, no rubs, no gallops Respiratory: bibasilar crackles R>L, no wheeze Abdominal: Soft, NT, ND, bowel sounds + Extremities: no edema, no cyanosis   The results of  significant diagnostics from this hospitalization (including imaging, microbiology, ancillary and laboratory) are listed below for reference.    Significant Diagnostic Studies: Dg Chest Port 1 View  Result Date: 05/07/2016 CLINICAL DATA:  Pleural effusion. EXAM: PORTABLE CHEST 1 VIEW COMPARISON:  05/06/2016. FINDINGS: Mediastinum hilar structures normal. Cardiomegaly with normal pulmonary vascularity. Low lung volumes. Persistent right lower lobe infiltrate with right pleural effusion. Slight improvement from prior exam. Biapical pleural thickening again noted consistent with scarring. IMPRESSION: Persistent right lower lobe infiltrate with small right pleural effusion. Slight improvement from prior exam . Electronically Signed   By: Marcello Moores  Register   On: 05/07/2016 07:02   Dg Chest Port 1 View  Result Date: 05/06/2016 CLINICAL DATA:  Follow-up pleural effusion. EXAM: PORTABLE CHEST 1 VIEW COMPARISON:  05/04/2016 FINDINGS: Unchanged heart size and mediastinal contours. Right pleural effusion with mild increase from prior, associated right basilar opacity again seen. Pulmonary to  the congestion and bronchial thickening, stable. Subsegmental atelectasis at the left lung base is unchanged. No pneumothorax. IMPRESSION: Slight increase in right pleural effusion. Unchanged vascular congestion. Electronically Signed   By: Jeb Levering M.D.   On: 05/06/2016 05:35   Dg Chest Port 1 View  Result Date: 05/04/2016 CLINICAL DATA:  Shortness of breath, pleural effusion, followup, history COPD EXAM: PORTABLE CHEST 1 VIEW COMPARISON:  Portable exam 0913 hours compared 05/03/2016 FINDINGS: Upper normal heart size. Slight pulmonary vascular congestion. Mediastinal contour stable. Mild LEFT basilar atelectasis with persistent atelectasis and pleural effusion at the RIGHT lung base, slightly increased. Underlying emphysematous and bronchitic changes. No pneumothorax. Bones demineralized. IMPRESSION: COPD changes with  increased RIGHT basilar atelectasis and small pleural effusion as well as persistent mild subsegmental atelectasis at LEFT base. Electronically Signed   By: Lavonia Dana M.D.   On: 05/04/2016 09:34   Dg Chest Port 1 View  Result Date: 05/03/2016 CLINICAL DATA:  Status post right thoracentesis. EXAM: PORTABLE CHEST 1 VIEW COMPARISON:  05/03/2016 at 9 a.m. FINDINGS: There is decreased opacity at the right lung base consistent with the interval removal of pleural fluid. Residual opacity is consistent with combination of atelectasis or pneumonia with a small residual pleural effusion. There is no pneumothorax. Reticular and linear opacity at the left lung base, likely combination scarring atelectasis, stable. There stable apical pleuroparenchymal scarring. IMPRESSION: 1. No evidence of a complication following right thoracentesis. No pneumothorax. 2. Significant decrease in right pleural fluid noted within overall decrease right basilar opacity. 3. No other change. Electronically Signed   By: Lajean Manes M.D.   On: 05/03/2016 12:39   Dg Chest Port 1 View  Result Date: 05/03/2016 CLINICAL DATA:  Follow-up pneumonia EXAM: PORTABLE CHEST 1 VIEW COMPARISON:  04/30/2016 FINDINGS: Cardiomediastinal silhouette is stable. Worsening infiltrate/ pneumonia in right lower lobe with small right pleural effusion. Persistent streaky left basilar atelectasis or infiltrate. No pulmonary edema. IMPRESSION: Worsening infiltrate/ pneumonia in right lower lobe with small right pleural effusion. Persistent streaky left basilar atelectasis or infiltrate. No pulmonary edema. Electronically Signed   By: Lahoma Crocker M.D.   On: 05/03/2016 09:49   Dg Chest Portable 1 View  Result Date: 04/30/2016 CLINICAL DATA:  Shortness of breath and chest pain today. Productive cough, nausea and fever. EXAM: PORTABLE CHEST 1 VIEW COMPARISON:  Single-view of the chest 04/18/2016. CT chest 03/27/2016. FINDINGS: The lungs are emphysematous. There is  right worse than left basilar airspace disease. Heart size is normal. No pneumothorax or pleural effusion. IMPRESSION: Right worse left basilar airspace disease has an appearance most consistent with pneumonia. Emphysema. Electronically Signed   By: Inge Rise M.D.   On: 04/30/2016 12:19   Dg Chest Port 1 View  Result Date: 04/18/2016 CLINICAL DATA:  Bronchoscopy.  Left perihilar mass EXAM: PORTABLE CHEST 1 VIEW COMPARISON:  CT 03/27/2016. FINDINGS: Mediastinum is stable. Persistent left perihilar fullness consistent with previously identified mass. Lungs are otherwise clear. No pleural effusion or pneumothorax. Biapical pleural thickening noted consistent with scarring. IMPRESSION: Persistent left perihilar fullness consistent with previously identified perihilar mass. Electronically Signed   By: Marcello Moores  Register   On: 04/18/2016 11:13     Microbiology: Recent Results (from the past 240 hour(s))  Blood culture (routine x 2)     Status: None   Collection Time: 04/30/16 12:35 PM  Result Value Ref Range Status   Specimen Description BLOOD BLOOD RIGHT FOREARM  Final   Special Requests BOTTLES DRAWN AEROBIC AND ANAEROBIC  5 CC  Final   Culture   Final    NO GROWTH 5 DAYS Performed at [facility], [place] [address], [place], [place] [address]    Report Status 05/05/2016 FINAL  Final  Blood culture (routine x 2)     Status: None   Collection Time: 04/30/16  2:49 PM  Result Value Ref Range Status   Specimen Description BLOOD RIGHT FOREARM  Final   Special Requests BOTTLES DRAWN AEROBIC AND ANAEROBIC 5 ML  Final   Culture   Final    NO GROWTH 5 DAYS Performed at [facility], [place] [address] [place], [place] [address]    Report Status 05/05/2016 FINAL  Final  Culture, sputum-assessment     Status: None   Collection Time: 04/30/16  8:41 PM  Result Value Ref Range Status   Specimen Description EXPECTORATED SPUTUM  Final   Special Requests NONE  Final   Sputum evaluation THIS  SPECIMEN IS ACCEPTABLE FOR SPUTUM CULTURE  Final   Report Status 04/30/2016 FINAL  Final  Urine culture     Status: None   Collection Time: 04/30/16  8:41 PM  Result Value Ref Range Status   Specimen Description URINE, RANDOM  Final   Special Requests NONE  Final   Culture   Final    NO GROWTH Performed at [facility], [place] [address] [place], [place] [address]    Report Status 05/02/2016 FINAL  Final  Culture, respiratory (NON-Expectorated)     Status: None   Collection Time: 04/30/16  8:41 PM  Result Value Ref Range Status   Specimen Description EXPECTORATED SPUTUM  Final   Special Requests NONE Reflexed from [id number]  Final   Gram Stain   Final    FEW WBC PRESENT, PREDOMINANTLY PMN RARE SQUAMOUS EPITHELIAL CELLS PRESENT FEW GRAM NEGATIVE COCCOBACILLI RARE GRAM POSITIVE COCCI IN PAIRS    Culture   Final    Consistent with normal respiratory flora. Performed at [facility], [place] [address] [place], [place] [address]    Report Status 05/03/2016 FINAL  Final  MRSA PCR Screening     Status: None   Collection Time: 04/30/16  8:43 PM  Result Value Ref Range Status   MRSA by PCR NEGATIVE NEGATIVE Final    Comment:        The GeneXpert MRSA Assay (FDA approved for NASAL specimens only), is one component of a comprehensive MRSA colonization surveillance program. It is not intended to diagnose MRSA infection nor to guide or monitor treatment for MRSA infections.   Respiratory Panel by PCR     Status: None   Collection Time: 05/01/16 11:22 AM  Result Value Ref Range Status   Adenovirus NOT DETECTED NOT DETECTED Final   Coronavirus 229E NOT DETECTED NOT DETECTED Final   Coronavirus HKU1 NOT DETECTED NOT DETECTED Final   Coronavirus NL63 NOT DETECTED NOT DETECTED Final   Coronavirus OC43 NOT DETECTED NOT DETECTED Final   Metapneumovirus NOT DETECTED NOT DETECTED Final   Rhinovirus / Enterovirus NOT DETECTED NOT DETECTED Final   Influenza A NOT DETECTED NOT  DETECTED Final   Influenza B NOT DETECTED NOT DETECTED Final   Parainfluenza Virus 1 NOT DETECTED NOT DETECTED Final   Parainfluenza Virus 2 NOT DETECTED NOT DETECTED Final   Parainfluenza Virus 3 NOT DETECTED NOT DETECTED Final   Parainfluenza Virus 4 NOT DETECTED NOT DETECTED Final   Respiratory Syncytial Virus NOT DETECTED NOT DETECTED Final  Bordetella pertussis NOT DETECTED NOT DETECTED Final   Chlamydophila pneumoniae NOT DETECTED NOT DETECTED Final   Mycoplasma pneumoniae NOT DETECTED NOT DETECTED Final    Comment: Performed at [facility], [place] [address] [place], [place] [address]  Body fluid culture     Status: None   Collection Time: 05/03/16 12:18 PM  Result Value Ref Range Status   Specimen Description THORACENTESIS  Final   Special Requests NONE  Final   Gram Stain   Final    ABUNDANT WBC PRESENT,BOTH PMN AND MONONUCLEAR NO ORGANISMS SEEN    Culture   Final    NO GROWTH 3 DAYS Performed at [facility], [address], [place],  [address]    Report Status 05/06/2016 FINAL  Final     Labs: Basic Metabolic Panel:  Recent Labs Lab 04/30/16 2138  05/03/16 0355 05/03/16 0901 05/05/16 0518 05/06/16 0521 05/07/16 0440  NA  --   < > 138 139 136 140 137  K  --   < > 4.8 5.0 5.1 3.8 4.3  CL  --   < > 104 105 95* 94* 95*  CO2  --   < > 28 28 36* 39* 37*  GLUCOSE  --   < > 143* 125* 104* 79 82  BUN  --   < > 35* 33* 34* 22* 21*  CREATININE  --   < > 0.52 0.53 0.54 0.36* 0.54  CALCIUM  --   < > 8.3* 8.2* 8.1* 8.0* 8.3*  MG 1.7  --   --   --   --   --   --   < > = values in this interval not displayed. Liver Function Tests:  Recent Labs Lab 05/03/16 1403 05/07/16 0440  AST  --  21  ALT  --  59*  ALKPHOS  --  49  BILITOT  --  0.4  PROT 4.9* 4.8*  ALBUMIN  --  2.7*   No results for input(s): LIPASE, AMYLASE in the last 168 hours. No results for input(s): AMMONIA in the last 168 hours. CBC:  Recent Labs Lab 05/03/16 0355  05/04/16 0450 05/05/16 0518 05/06/16 0521 05/07/16 0440  WBC 24.8* 23.7* 19.3* 6.6 22.4*  HGB 10.1* 10.1* 10.3* 10.8* 11.7*  HCT 33.7* 32.9* 33.5* 35.6* 38.4  MCV 98.0 98.8 96.5 97.3 95.0  PLT 175 175 168 148* 171   Cardiac Enzymes: No results for input(s): CKTOTAL, CKMB, CKMBINDEX, TROPONINI in the last 168 hours. BNP: Invalid input(s): POCBNP CBG: No results for input(s): GLUCAP in the last 168 hours.  Time coordinating discharge:  Greater than 30 minutes  Signed:  Demarius Archila, DO [facility] Pager: [phone] 05/07/2016, 1:49 PM

## 2016-05-05 NOTE — Progress Notes (Signed)
PROGRESS NOTE  Sheri Ayala XTK:[id number] DOB: 1950-11-01 DOA: 04/30/2016 PCP: Alvester Chou, NP  Brief History: 66 y.o.with a history of COPD, CLL, tobacco abuse, depression, anxiety, left lung mass, history of CVA. She presented with dyspnea, subjective fever, palpitations. She is found to have runs a infection and initially presented in A. fib with RVR. 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 She was discharged to home 1/5.She states she actually began to feel worse pretty much the day after d/c. She reported increased nasal congestion, increased cough, progressive weakness and worsening shortness of breath. Also was having palpitations. This occurred in spite of pred taper and antibiotics which were continued after d/c. In the ER she was noted to be in AF w/ RVR, O2 sats were in the mid-80s, and CXR showed new RLL infiltrate. She was admitted to the medical service in the step-down setting   Assessment/Plan: Acute on chronic respiratory failure with hypoxia -Secondary to HCAP in setting of AECOPD -Wean O2 to home demand -Normally on 2 L nasal cannula at home -appreciate Pulm consult -continue xopenex, atrovent -continue solumedrol IV-->plan slow taper over next 10 days -Prednisone started 05/03/2016  HCAP -D/C aztreonam -Discontinue vancomycin -05/02/16--startedcefepime (pt states she took ampicillin in past without problems)--questionablePCN allergy -05/03/16--switch to levoflox per pulm -RVP negative  Right pleural effusion -Appears transudative--suspect due to chronic hepatic congestion from cor pulmonale -05/03/2016-thoracocentesis--750 mL removed -culture neg to date -05/04/16--furosemide started -BMP in the morning -Daily weights -Repeat chest x-ray 05/06/2016  Chronic  lymphocytic leukemia Stable. Outpatient follow-up with oncology   Mass of left lung -Status post bronchoscopy and biopsy with pulmonology.  -Outpatient follow-up -need outpt PET scan -Will need repeat attempt at tissue sampling after acute infection resolved. Dr Melvyn Novas can arrange this in out-pt setting per pulmonary  Atrial fibrillation with rapid ventricular response--newly diagnosed--duration unclear -CHA2DS2-VASc Scoreis 4. -Appreciate Cardiology recommendations -continue diltiazem 30 mg po q 6 hrs-->diltiazem CD 120 mg-->rate controlled -2-D echocardiogram--EF 60-65%, moderate TR, no WMA, PASP 37 -TSH--0.310 -check free T4--1.05 --Discontinue IV heparin, started apixaban 05/04/2016  Depression Anxiety Insomnia -Continue when necessary Xanax -Continue Restoril -Continue Zoloft  Cervical strain -started flexeril -d/c toradol -heat pad  Euthyroid Sick Syndrome -TSH 0.310, Free T4 1.05 -repeat TSH in 4-5 weeks  Severe malnutrition -continue supplements  F/E/N -1/20--kayexalate x 1 as K is trending up -am BMP  Disposition Plan: SNF 1/21 if stable Family Communication: No family at bedside    Consultants: Cardiology, pulmonary  Code Status: FULL  DVT Prophylaxis: IVHeparin-->apixaban   Procedures: As Listed in Progress Note Above  Antibiotics: Levoflox 05/03/16>>> Cefepime 1/17>>>05/03/16 Aztreonam 1/15>>1/17 Vanc 1/15>>1/16     Subjective: Patient denies fevers, chills, headache, chest pain, dyspnea, nausea, vomiting, diarrhea, abdominal pain, dysuria, hematuria, hematochezia, and melena.   Objective: Vitals:   05/05/16 0842 05/05/16 1008 05/05/16 1328 05/05/16 1333  BP:  109/61 (!) 114/42   Pulse:  83 86 88  Resp: '18 20 18 18  '$ Temp:   98.3 F (36.8 C)   TempSrc:   Oral   SpO2:  91% 96% 95%  Weight:      Height:        Intake/Output Summary (Last 24 hours) at 05/05/16 1546 Last data filed at 05/05/16 1000   Gross  per 24 hour  Intake              240 ml  Output              300 ml  Net              -60 ml   Weight change:  Exam:   General:  Pt is alert, follows commands appropriately, not in acute distress  HEENT: No icterus, No thrush, No neck mass, [place]/AT  Cardiovascular: RRR, S1/S2, no rubs, no gallops  Respiratory: Bibasilar crackles, right greater than left. No wheezing. Air movement.  Abdomen: Soft/+BS, non tender, non distended, no guarding  Extremities: No edema, No lymphangitis, No petechiae, No rashes, no synovitis   Data Reviewed: I have personally reviewed following labs and imaging studies Basic Metabolic Panel:  Recent Labs Lab 04/30/16 2138 05/01/16 0328 05/02/16 0332 05/03/16 0355 05/03/16 0901 05/05/16 0518  NA  --  139 137 138 139 136  K  --  4.2 4.7 4.8 5.0 5.1  CL  --  105 104 104 105 95*  CO2  --  '27 29 28 28 '$ 36*  GLUCOSE  --  140* 140* 143* 125* 104*  BUN  --  13 20 35* 33* 34*  CREATININE  --  0.51 0.32* 0.52 0.53 0.54  CALCIUM  --  7.4* 7.9* 8.3* 8.2* 8.1*  MG 1.7  --   --   --   --   --    Liver Function Tests:  Recent Labs Lab 05/03/16 1403  PROT 4.9*   No results for input(s): LIPASE, AMYLASE in the last 168 hours. No results for input(s): AMMONIA in the last 168 hours. Coagulation Profile:  Recent Labs Lab 05/01/16 1400  INR 1.44   CBC:  Recent Labs Lab 05/01/16 0328 05/02/16 0332 05/03/16 0355 05/04/16 0450 05/05/16 0518  WBC 38.1* 25.1* 24.8* 23.7* 19.3*  HGB 11.6* 10.7* 10.1* 10.1* 10.3*  HCT 38.4 35.5* 33.7* 32.9* 33.5*  MCV 97.7 99.7 98.0 98.8 96.5  PLT 230 191 175 175 168   Cardiac Enzymes: No results for input(s): CKTOTAL, CKMB, CKMBINDEX, TROPONINI in the last 168 hours. BNP: Invalid input(s): POCBNP CBG: No results for input(s): GLUCAP in the last 168 hours. HbA1C: No results for input(s): HGBA1C in the last 72 hours. Urine analysis:    Component Value Date/Time   COLORURINE STRAW (A) 09/24/2015 1946    APPEARANCEUR CLEAR (A) 09/24/2015 1946   LABSPEC 1.010 09/24/2015 1946   PHURINE 5.0 09/24/2015 1946   GLUCOSEU NEGATIVE 09/24/2015 1946   HGBUR 1+ (A) 09/24/2015 1946   BILIRUBINUR NEGATIVE 09/24/2015 1946   KETONESUR NEGATIVE 09/24/2015 1946   PROTEINUR NEGATIVE 09/24/2015 1946   UROBILINOGEN 0.2 01/06/2015 1843   NITRITE NEGATIVE 09/24/2015 1946   LEUKOCYTESUR NEGATIVE 09/24/2015 1946   Sepsis Labs: '@LABRCNTIP'$ (procalcitonin:4,lacticidven:4) ) Recent Results (from the past 240 hour(s))  Blood culture (routine x 2)     Status: None   Collection Time: 04/30/16 12:35 PM  Result Value Ref Range Status   Specimen Description BLOOD BLOOD RIGHT FOREARM  Final   Special Requests BOTTLES DRAWN AEROBIC AND ANAEROBIC 5 CC  Final   Culture   Final    NO GROWTH 5 DAYS Performed at [facility], [place] [address] [place], [place] [address]    Report Status 05/05/2016 FINAL  Final  Blood culture (routine x 2)     Status: None   Collection Time: 04/30/16  2:49 PM  Result  Value Ref Range Status   Specimen Description BLOOD RIGHT FOREARM  Final   Special Requests BOTTLES DRAWN AEROBIC AND ANAEROBIC 5 ML  Final   Culture   Final    NO GROWTH 5 DAYS Performed at [facility], [address] [place], [place] [address]    Report Status 05/05/2016 FINAL  Final  Culture, sputum-assessment     Status: None   Collection Time: 04/30/16  8:41 PM  Result Value Ref Range Status   Specimen Description EXPECTORATED SPUTUM  Final   Special Requests NONE  Final   Sputum evaluation THIS SPECIMEN IS ACCEPTABLE FOR SPUTUM CULTURE  Final   Report Status 04/30/2016 FINAL  Final  Urine culture     Status: None   Collection Time: 04/30/16  8:41 PM  Result Value Ref Range Status   Specimen Description URINE, RANDOM  Final   Special Requests NONE  Final   Culture   Final    NO GROWTH Performed at [facility], [place] [address], [place], [place] [address]    Report Status 05/02/2016 FINAL   Final  Culture, respiratory (NON-Expectorated)     Status: None   Collection Time: 04/30/16  8:41 PM  Result Value Ref Range Status   Specimen Description EXPECTORATED SPUTUM  Final   Special Requests NONE Reflexed from [id number]  Final   Gram Stain   Final    FEW WBC PRESENT, PREDOMINANTLY PMN RARE SQUAMOUS EPITHELIAL CELLS PRESENT FEW GRAM NEGATIVE COCCOBACILLI RARE GRAM POSITIVE COCCI IN PAIRS    Culture   Final    Consistent with normal respiratory flora. Performed at [facility], [place] [address], [place], [place] [address]    Report Status 05/03/2016 FINAL  Final  MRSA PCR Screening     Status: None   Collection Time: 04/30/16  8:43 PM  Result Value Ref Range Status   MRSA by PCR NEGATIVE NEGATIVE Final    Comment:        The GeneXpert MRSA Assay (FDA approved for NASAL specimens only), is one component of a comprehensive MRSA colonization surveillance program. It is not intended to diagnose MRSA infection nor to guide or monitor treatment for MRSA infections.   Respiratory Panel by PCR     Status: None   Collection Time: 05/01/16 11:22 AM  Result Value Ref Range Status   Adenovirus NOT DETECTED NOT DETECTED Final   Coronavirus 229E NOT DETECTED NOT DETECTED Final   Coronavirus HKU1 NOT DETECTED NOT DETECTED Final   Coronavirus NL63 NOT DETECTED NOT DETECTED Final   Coronavirus OC43 NOT DETECTED NOT DETECTED Final   Metapneumovirus NOT DETECTED NOT DETECTED Final   Rhinovirus / Enterovirus NOT DETECTED NOT DETECTED Final   Influenza A NOT DETECTED NOT DETECTED Final   Influenza B NOT DETECTED NOT DETECTED Final   Parainfluenza Virus 1 NOT DETECTED NOT DETECTED Final   Parainfluenza Virus 2 NOT DETECTED NOT DETECTED Final   Parainfluenza Virus 3 NOT DETECTED NOT DETECTED Final   Parainfluenza Virus 4 NOT DETECTED NOT DETECTED Final   Respiratory Syncytial Virus NOT DETECTED NOT DETECTED Final   Bordetella pertussis NOT DETECTED NOT DETECTED Final    Chlamydophila pneumoniae NOT DETECTED NOT DETECTED Final   Mycoplasma pneumoniae NOT DETECTED NOT DETECTED Final    Comment: Performed at [facility], [place] [address], [place], [place] [address]  Body fluid culture     Status: None (Preliminary result)   Collection Time: 05/03/16 12:18 PM  Result Value Ref Range Status   Specimen Description THORACENTESIS  Final   Special Requests NONE  Final   Gram Stain   Final    ABUNDANT WBC PRESENT,BOTH PMN AND MONONUCLEAR NO ORGANISMS SEEN    Culture   Final    NO GROWTH 2 DAYS Performed at [facility], [address], [place],  [address]    Report Status PENDING  Incomplete     Scheduled Meds: . apixaban  5 mg Oral BID  . budesonide (PULMICORT) nebulizer solution  0.5 mg Nebulization BID  . cyclobenzaprine  5 mg Oral TID  . diltiazem  120 mg Oral Daily  . feeding supplement (ENSURE ENLIVE)  237 mL Oral BID BM  . furosemide  40 mg Oral Daily  . ipratropium  0.5 mg Nebulization TID  . levalbuterol  0.63 mg Nebulization TID  . levofloxacin  750 mg Oral Daily  . mouth rinse  15 mL Mouth Rinse BID  . predniSONE  30 mg Oral Q breakfast  . sertraline  25 mg Oral Daily  . sodium chloride flush  3 mL Intravenous Q12H  . temazepam  15 mg Oral QHS   Continuous Infusions:  Procedures/Studies: Dg Chest Port 1 View  Result Date: 05/04/2016 CLINICAL DATA:  Shortness of breath, pleural effusion, followup, history COPD EXAM: PORTABLE CHEST 1 VIEW COMPARISON:  Portable exam 0913 hours compared 05/03/2016 FINDINGS: Upper normal heart size. Slight pulmonary vascular congestion. Mediastinal contour stable. Mild LEFT basilar atelectasis with persistent atelectasis and pleural effusion at the RIGHT lung base, slightly increased. Underlying emphysematous and bronchitic changes. No pneumothorax. Bones demineralized. IMPRESSION: COPD changes with increased RIGHT basilar atelectasis and small pleural effusion as well as persistent mild  subsegmental atelectasis at LEFT base. Electronically Signed   By: Lavonia Dana M.D.   On: 05/04/2016 09:34   Dg Chest Port 1 View  Result Date: 05/03/2016 CLINICAL DATA:  Status post right thoracentesis. EXAM: PORTABLE CHEST 1 VIEW COMPARISON:  05/03/2016 at 9 a.m. FINDINGS: There is decreased opacity at the right lung base consistent with the interval removal of pleural fluid. Residual opacity is consistent with combination of atelectasis or pneumonia with a small residual pleural effusion. There is no pneumothorax. Reticular and linear opacity at the left lung base, likely combination scarring atelectasis, stable. There stable apical pleuroparenchymal scarring. IMPRESSION: 1. No evidence of a complication following right thoracentesis. No pneumothorax. 2. Significant decrease in right pleural fluid noted within overall decrease right basilar opacity. 3. No other change. Electronically Signed   By: Lajean Manes M.D.   On: 05/03/2016 12:39   Dg Chest Port 1 View  Result Date: 05/03/2016 CLINICAL DATA:  Follow-up pneumonia EXAM: PORTABLE CHEST 1 VIEW COMPARISON:  04/30/2016 FINDINGS: Cardiomediastinal silhouette is stable. Worsening infiltrate/ pneumonia in right lower lobe with small right pleural effusion. Persistent streaky left basilar atelectasis or infiltrate. No pulmonary edema. IMPRESSION: Worsening infiltrate/ pneumonia in right lower lobe with small right pleural effusion. Persistent streaky left basilar atelectasis or infiltrate. No pulmonary edema. Electronically Signed   By: Lahoma Crocker M.D.   On: 05/03/2016 09:49   Dg Chest Portable 1 View  Result Date: 04/30/2016 CLINICAL DATA:  Shortness of breath and chest pain today. Productive cough, nausea and fever. EXAM: PORTABLE CHEST 1 VIEW COMPARISON:  Single-view of the chest 04/18/2016. CT chest 03/27/2016. FINDINGS: The lungs are emphysematous. There is right worse than left basilar airspace disease. Heart size is normal. No pneumothorax or  pleural effusion. IMPRESSION:  Right worse left basilar airspace disease has an appearance most consistent with pneumonia. Emphysema. Electronically Signed   By: Inge Rise M.D.   On: 04/30/2016 12:19   Dg Chest Port 1 View  Result Date: 04/18/2016 CLINICAL DATA:  Bronchoscopy.  Left perihilar mass EXAM: PORTABLE CHEST 1 VIEW COMPARISON:  CT 03/27/2016. FINDINGS: Mediastinum is stable. Persistent left perihilar fullness consistent with previously identified mass. Lungs are otherwise clear. No pleural effusion or pneumothorax. Biapical pleural thickening noted consistent with scarring. IMPRESSION: Persistent left perihilar fullness consistent with previously identified perihilar mass. Electronically Signed   By: Marcello Moores  Register   On: 04/18/2016 11:13    Blake Goya, DO  [facility] Pager [phone]  If 7PM-7AM, please contact night-coverage [email or web address] Password [email or web address] 05/05/2016, 3:46 PM   LOS: 5 days

## 2016-05-06 ENCOUNTER — Inpatient Hospital Stay (HOSPITAL_COMMUNITY): Payer: Medicare HMO

## 2016-05-06 LAB — CBC
HEMATOCRIT: 35.6 % — AB (ref 36.0–46.0)
Hemoglobin: 10.8 g/dL — ABNORMAL LOW (ref 12.0–15.0)
MCH: 29.5 pg (ref 26.0–34.0)
MCHC: 30.3 g/dL (ref 30.0–36.0)
MCV: 97.3 fL (ref 78.0–100.0)
Platelets: 148 10*3/uL — ABNORMAL LOW (ref 150–400)
RBC: 3.66 MIL/uL — AB (ref 3.87–5.11)
RDW: 14.2 % (ref 11.5–15.5)
WBC: 6.6 10*3/uL (ref 4.0–10.5)

## 2016-05-06 LAB — BASIC METABOLIC PANEL
ANION GAP: 7 (ref 5–15)
BUN: 22 mg/dL — ABNORMAL HIGH (ref 6–20)
CHLORIDE: 94 mmol/L — AB (ref 101–111)
CO2: 39 mmol/L — AB (ref 22–32)
Calcium: 8 mg/dL — ABNORMAL LOW (ref 8.9–10.3)
Creatinine, Ser: 0.36 mg/dL — ABNORMAL LOW (ref 0.44–1.00)
GFR calc Af Amer: >60 mL/min (ref >60)
GFR calc non Af Amer: >60 mL/min (ref >60)
GLUCOSE: 79 mg/dL (ref 65–99)
POTASSIUM: 3.8 mmol/L (ref 3.5–5.1)
Sodium: 140 mmol/L (ref 135–145)

## 2016-05-06 LAB — PH, BODY FLUID: PH, BODY FLUID: 8.1

## 2016-05-06 LAB — CHOLESTEROL, BODY FLUID: Cholesterol, Fluid: 11 mg/dL

## 2016-05-06 LAB — BODY FLUID CULTURE: CULTURE: NO GROWTH

## 2016-05-06 NOTE — Progress Notes (Addendum)
PROGRESS NOTE  Sheri Ayala RUE:[id number] DOB: 02-15-1951 DOA: 04/30/2016 PCP: Alvester Chou, NP  Brief History:  66 y.o.with a history of COPD, CLL, tobacco abuse, depression, anxiety, left lung mass, history of CVA. She presented with dyspnea, subjective fever, palpitations. She is found to have runs a infection and initially presented in A. fib with RVR. 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 She was discharged to home 1/5.She states she actually began to feel worse pretty much the day after d/c. She reported increased nasal congestion, increased cough, progressive weakness and worsening shortness of breath. Also was having palpitations. This occurred in spite of pred taper and antibiotics which were continued after d/c. In the ER she was noted to be in AF w/ RVR, O2 sats were in the mid-80s, and CXR showed new RLL infiltrate. She was admitted to the medical service in the step-down setting  Assessment/Plan: Acute on chronic respiratory failure with hypoxia -Secondary to HCAP in setting of AECOPD -Wean O2 to home demand -Normally on 2 L nasal cannula at home -appreciate Pulm consult -continue xopenex, atrovent -continue solumedrol IV-->plan slow taper over next 10 days -Prednisone started 05/03/2016  HCAP -D/C aztreonam -Discontinue vancomycin -05/02/16--startedcefepime (pt states she took ampicillin in past without problems)--questionablePCN allergy -05/03/16--switch to levoflox per pulm -RVP negative  Right pleural effusion -Appears transudative--suspect due to chronic hepatic congestion from cor pulmonale -05/03/2016-thoracocentesis--750 mL removed -culture neg to date -05/04/16--furosemide started -BMP in the morning -Daily weights -Repeat chest x-ray 05/06/2016--slight increase  R-pleural effusion -cytology--atypical cells with background inflammation -repeat CXR 05/07/16  Chronic lymphocytic leukemia Stable. Outpatient follow-up with oncology   Mass of left lung -Status post bronchoscopy and biopsy with pulmonology.  -Outpatient follow-up -need outpt PET scan -Will need repeat attempt at tissue sampling after acute infection resolved. Dr Melvyn Novas can arrange this in out-pt setting per pulmonary  Atrial fibrillation with rapid ventricular response--newly diagnosed--duration unclear -CHA2DS2-VASc Scoreis 4. -Appreciate Cardiology recommendations -continue diltiazem 30 mg po q 6 hrs-->diltiazem CD 120 mg-->rate controlled -2-D echocardiogram--EF 60-65%, moderate TR, no WMA, PASP 37 -TSH--0.310 -check free T4--1.05 --Discontinue IV heparin, started apixaban 05/04/2016  Depression Anxiety Insomnia -Continue when necessary Xanax -Continue Restoril -Continue Zoloft  Cervical strain -started flexeril -d/c toradol -heat pad  Euthyroid Sick Syndrome -TSH 0.310, Free T4 1.05 -repeat TSH in 4-5 weeks  Severe malnutrition -continue supplements  F/E/N -1/20--kayexalate x 1 as K is trending up -am BMP  Disposition Plan: SNF1/22 if cleared by pulmonary Family Communication: No family at bedside    Consultants: Cardiology, pulmonary  Code Status: FULL  DVT Prophylaxis: IVHeparin-->apixaban   Procedures: As Listed in Progress Note Above  Antibiotics: Levoflox 05/03/16>>> Cefepime 1/17>>>05/03/16 Aztreonam 1/15>>1/17 Vanc 1/15>>1/16            Subjective: Patient denies fevers, chills, headache, chest pain, dyspnea, nausea, vomiting, diarrhea, abdominal pain, dysuria, hematuria, hematochezia, and melena.   Objective: Vitals:   05/06/16 0817 05/06/16 0820 05/06/16 0910 05/06/16 1457  BP:   110/72   Pulse:      Resp:      Temp:      TempSrc:      SpO2: 96% 96%  96%  Weight:      Height:         Intake/Output  Summary (Last 24 hours) at 05/06/16 1502 Last data filed at 05/06/16 1000  Gross per 24 hour  Intake              600 ml  Output                0 ml  Net              600 ml   Weight change: -5.862 kg (-12 lb 14.8 oz) Exam:   General:  Pt is alert, follows commands appropriately, not in acute distress  HEENT: No icterus, No thrush, No neck mass, [place]/AT  Cardiovascular: RRR, S1/S2, no rubs, no gallops  Respiratory: Diminished breath sounds right base. Bibasilar crackles. No wheezing.  Abdomen: Soft/+BS, non tender, non distended, no guarding  Extremities: No edema, No lymphangitis, No petechiae, No rashes, no synovitis   Data Reviewed: I have personally reviewed following labs and imaging studies Basic Metabolic Panel:  Recent Labs Lab 04/30/16 2138  05/02/16 0332 05/03/16 0355 05/03/16 0901 05/05/16 0518 05/06/16 0521  NA  --   < > 137 138 139 136 140  K  --   < > 4.7 4.8 5.0 5.1 3.8  CL  --   < > 104 104 105 95* 94*  CO2  --   < > '29 28 28 '$ 36* 39*  GLUCOSE  --   < > 140* 143* 125* 104* 79  BUN  --   < > 20 35* 33* 34* 22*  CREATININE  --   < > 0.32* 0.52 0.53 0.54 0.36*  CALCIUM  --   < > 7.9* 8.3* 8.2* 8.1* 8.0*  MG 1.7  --   --   --   --   --   --   < > = values in this interval not displayed. Liver Function Tests:  Recent Labs Lab 05/03/16 1403  PROT 4.9*   No results for input(s): LIPASE, AMYLASE in the last 168 hours. No results for input(s): AMMONIA in the last 168 hours. Coagulation Profile:  Recent Labs Lab 05/01/16 1400  INR 1.44   CBC:  Recent Labs Lab 05/02/16 0332 05/03/16 0355 05/04/16 0450 05/05/16 0518 05/06/16 0521  WBC 25.1* 24.8* 23.7* 19.3* 6.6  HGB 10.7* 10.1* 10.1* 10.3* 10.8*  HCT 35.5* 33.7* 32.9* 33.5* 35.6*  MCV 99.7 98.0 98.8 96.5 97.3  PLT 191 175 175 168 148*   Cardiac Enzymes: No results for input(s): CKTOTAL, CKMB, CKMBINDEX, TROPONINI in the last 168 hours. BNP: Invalid input(s):  POCBNP CBG: No results for input(s): GLUCAP in the last 168 hours. HbA1C: No results for input(s): HGBA1C in the last 72 hours. Urine analysis:    Component Value Date/Time   COLORURINE STRAW (A) 09/24/2015 1946   APPEARANCEUR CLEAR (A) 09/24/2015 1946   LABSPEC 1.010 09/24/2015 1946   PHURINE 5.0 09/24/2015 1946   GLUCOSEU NEGATIVE 09/24/2015 1946   HGBUR 1+ (A) 09/24/2015 1946   BILIRUBINUR NEGATIVE 09/24/2015 1946   KETONESUR NEGATIVE 09/24/2015 1946   PROTEINUR NEGATIVE 09/24/2015 1946   UROBILINOGEN 0.2 01/06/2015 1843   NITRITE NEGATIVE 09/24/2015 1946   LEUKOCYTESUR NEGATIVE 09/24/2015 1946   Sepsis Labs: '@LABRCNTIP'$ (procalcitonin:4,lacticidven:4) ) Recent Results (from the past 240 hour(s))  Blood culture (routine x 2)     Status: None   Collection Time: 04/30/16 12:35 PM  Result Value Ref Range Status   Specimen Description BLOOD BLOOD RIGHT FOREARM  Final   Special Requests BOTTLES DRAWN AEROBIC AND ANAEROBIC 5 CC  Final  Culture   Final    NO GROWTH 5 DAYS Performed at [facility], [place] [address] [place], [place] [address]    Report Status 05/05/2016 FINAL  Final  Blood culture (routine x 2)     Status: None   Collection Time: 04/30/16  2:49 PM  Result Value Ref Range Status   Specimen Description BLOOD RIGHT FOREARM  Final   Special Requests BOTTLES DRAWN AEROBIC AND ANAEROBIC 5 ML  Final   Culture   Final    NO GROWTH 5 DAYS Performed at [facility], [place] [address] [place], [place] [address]    Report Status 05/05/2016 FINAL  Final  Culture, sputum-assessment     Status: None   Collection Time: 04/30/16  8:41 PM  Result Value Ref Range Status   Specimen Description EXPECTORATED SPUTUM  Final   Special Requests NONE  Final   Sputum evaluation THIS SPECIMEN IS ACCEPTABLE FOR SPUTUM CULTURE  Final   Report Status 04/30/2016 FINAL  Final  Urine culture     Status: None   Collection Time: 04/30/16  8:41 PM  Result Value Ref Range Status    Specimen Description URINE, RANDOM  Final   Special Requests NONE  Final   Culture   Final    NO GROWTH Performed at [facility], [place] [address], [place], [place] [address]    Report Status 05/02/2016 FINAL  Final  Culture, respiratory (NON-Expectorated)     Status: None   Collection Time: 04/30/16  8:41 PM  Result Value Ref Range Status   Specimen Description EXPECTORATED SPUTUM  Final   Special Requests NONE Reflexed from [id number]  Final   Gram Stain   Final    FEW WBC PRESENT, PREDOMINANTLY PMN RARE SQUAMOUS EPITHELIAL CELLS PRESENT FEW GRAM NEGATIVE COCCOBACILLI RARE GRAM POSITIVE COCCI IN PAIRS    Culture   Final    Consistent with normal respiratory flora. Performed at [facility], [place] [address], [place], [place] [address]    Report Status 05/03/2016 FINAL  Final  MRSA PCR Screening     Status: None   Collection Time: 04/30/16  8:43 PM  Result Value Ref Range Status   MRSA by PCR NEGATIVE NEGATIVE Final    Comment:        The GeneXpert MRSA Assay (FDA approved for NASAL specimens only), is one component of a comprehensive MRSA colonization surveillance program. It is not intended to diagnose MRSA infection nor to guide or monitor treatment for MRSA infections.   Respiratory Panel by PCR     Status: None   Collection Time: 05/01/16 11:22 AM  Result Value Ref Range Status   Adenovirus NOT DETECTED NOT DETECTED Final   Coronavirus 229E NOT DETECTED NOT DETECTED Final   Coronavirus HKU1 NOT DETECTED NOT DETECTED Final   Coronavirus NL63 NOT DETECTED NOT DETECTED Final   Coronavirus OC43 NOT DETECTED NOT DETECTED Final   Metapneumovirus NOT DETECTED NOT DETECTED Final   Rhinovirus / Enterovirus NOT DETECTED NOT DETECTED Final   Influenza A NOT DETECTED NOT DETECTED Final   Influenza B NOT DETECTED NOT DETECTED Final   Parainfluenza Virus 1 NOT DETECTED NOT DETECTED Final   Parainfluenza Virus 2 NOT DETECTED NOT DETECTED Final   Parainfluenza Virus 3  NOT DETECTED NOT DETECTED Final   Parainfluenza Virus 4 NOT DETECTED NOT DETECTED Final   Respiratory Syncytial Virus NOT DETECTED NOT DETECTED Final   Bordetella pertussis NOT DETECTED NOT DETECTED  Final   Chlamydophila pneumoniae NOT DETECTED NOT DETECTED Final   Mycoplasma pneumoniae NOT DETECTED NOT DETECTED Final    Comment: Performed at [facility], [place] [address], [place], [place] [address]  Body fluid culture     Status: None   Collection Time: 05/03/16 12:18 PM  Result Value Ref Range Status   Specimen Description THORACENTESIS  Final   Special Requests NONE  Final   Gram Stain   Final    ABUNDANT WBC PRESENT,BOTH PMN AND MONONUCLEAR NO ORGANISMS SEEN    Culture   Final    NO GROWTH 3 DAYS Performed at [facility], [address] [place], [place] [address]    Report Status 05/06/2016 FINAL  Final     Scheduled Meds: . apixaban  5 mg Oral BID  . budesonide (PULMICORT) nebulizer solution  0.5 mg Nebulization BID  . cyclobenzaprine  5 mg Oral TID  . diltiazem  120 mg Oral Daily  . feeding supplement (ENSURE ENLIVE)  237 mL Oral BID BM  . furosemide  40 mg Oral Daily  . ipratropium  0.5 mg Nebulization TID  . levalbuterol  0.63 mg Nebulization TID  . levofloxacin  750 mg Oral Daily  . mouth rinse  15 mL Mouth Rinse BID  . predniSONE  30 mg Oral Q breakfast  . sertraline  25 mg Oral Daily  . sodium chloride flush  3 mL Intravenous Q12H  . temazepam  15 mg Oral QHS   Continuous Infusions:  Procedures/Studies: Dg Chest Port 1 View  Result Date: 05/06/2016 CLINICAL DATA:  Follow-up pleural effusion. EXAM: PORTABLE CHEST 1 VIEW COMPARISON:  05/04/2016 FINDINGS: Unchanged heart size and mediastinal contours. Right pleural effusion with mild increase from prior, associated right basilar opacity again seen. Pulmonary to the congestion and bronchial thickening, stable. Subsegmental atelectasis at the left lung base is unchanged. No pneumothorax. IMPRESSION:  Slight increase in right pleural effusion. Unchanged vascular congestion. Electronically Signed   By: Jeb Levering M.D.   On: 05/06/2016 05:35   Dg Chest Port 1 View  Result Date: 05/04/2016 CLINICAL DATA:  Shortness of breath, pleural effusion, followup, history COPD EXAM: PORTABLE CHEST 1 VIEW COMPARISON:  Portable exam 0913 hours compared 05/03/2016 FINDINGS: Upper normal heart size. Slight pulmonary vascular congestion. Mediastinal contour stable. Mild LEFT basilar atelectasis with persistent atelectasis and pleural effusion at the RIGHT lung base, slightly increased. Underlying emphysematous and bronchitic changes. No pneumothorax. Bones demineralized. IMPRESSION: COPD changes with increased RIGHT basilar atelectasis and small pleural effusion as well as persistent mild subsegmental atelectasis at LEFT base. Electronically Signed   By: Lavonia Dana M.D.   On: 05/04/2016 09:34   Dg Chest Port 1 View  Result Date: 05/03/2016 CLINICAL DATA:  Status post right thoracentesis. EXAM: PORTABLE CHEST 1 VIEW COMPARISON:  05/03/2016 at 9 a.m. FINDINGS: There is decreased opacity at the right lung base consistent with the interval removal of pleural fluid. Residual opacity is consistent with combination of atelectasis or pneumonia with a small residual pleural effusion. There is no pneumothorax. Reticular and linear opacity at the left lung base, likely combination scarring atelectasis, stable. There stable apical pleuroparenchymal scarring. IMPRESSION: 1. No evidence of a complication following right thoracentesis. No pneumothorax. 2. Significant decrease in right pleural fluid noted within overall decrease right basilar opacity. 3. No other change. Electronically Signed   By: Lajean Manes M.D.   On: 05/03/2016 12:39   Dg Chest Port 1 View  Result Date:  05/03/2016 CLINICAL DATA:  Follow-up pneumonia EXAM: PORTABLE CHEST 1 VIEW COMPARISON:  04/30/2016 FINDINGS: Cardiomediastinal silhouette is stable.  Worsening infiltrate/ pneumonia in right lower lobe with small right pleural effusion. Persistent streaky left basilar atelectasis or infiltrate. No pulmonary edema. IMPRESSION: Worsening infiltrate/ pneumonia in right lower lobe with small right pleural effusion. Persistent streaky left basilar atelectasis or infiltrate. No pulmonary edema. Electronically Signed   By: Lahoma Crocker M.D.   On: 05/03/2016 09:49   Dg Chest Portable 1 View  Result Date: 04/30/2016 CLINICAL DATA:  Shortness of breath and chest pain today. Productive cough, nausea and fever. EXAM: PORTABLE CHEST 1 VIEW COMPARISON:  Single-view of the chest 04/18/2016. CT chest 03/27/2016. FINDINGS: The lungs are emphysematous. There is right worse than left basilar airspace disease. Heart size is normal. No pneumothorax or pleural effusion. IMPRESSION: Right worse left basilar airspace disease has an appearance most consistent with pneumonia. Emphysema. Electronically Signed   By: Inge Rise M.D.   On: 04/30/2016 12:19   Dg Chest Port 1 View  Result Date: 04/18/2016 CLINICAL DATA:  Bronchoscopy.  Left perihilar mass EXAM: PORTABLE CHEST 1 VIEW COMPARISON:  CT 03/27/2016. FINDINGS: Mediastinum is stable. Persistent left perihilar fullness consistent with previously identified mass. Lungs are otherwise clear. No pleural effusion or pneumothorax. Biapical pleural thickening noted consistent with scarring. IMPRESSION: Persistent left perihilar fullness consistent with previously identified perihilar mass. Electronically Signed   By: Marcello Moores  Register   On: 04/18/2016 11:13    Shatia Sindoni, DO  [facility] Pager [phone]  If 7PM-7AM, please contact night-coverage [email or web address] Password [email or web address] 05/06/2016, 3:02 PM   LOS: 6 days

## 2016-05-07 ENCOUNTER — Inpatient Hospital Stay (HOSPITAL_COMMUNITY): Payer: Medicare HMO

## 2016-05-07 LAB — CBC
HEMATOCRIT: 38.4 % (ref 36.0–46.0)
HEMOGLOBIN: 11.7 g/dL — AB (ref 12.0–15.0)
MCH: 29 pg (ref 26.0–34.0)
MCHC: 30.5 g/dL (ref 30.0–36.0)
MCV: 95 fL (ref 78.0–100.0)
PLATELETS: 171 10*3/uL (ref 150–400)
RBC: 4.04 MIL/uL (ref 3.87–5.11)
RDW: 14.1 % (ref 11.5–15.5)
WBC: 22.4 10*3/uL — ABNORMAL HIGH (ref 4.0–10.5)

## 2016-05-07 LAB — COMPREHENSIVE METABOLIC PANEL
ALK PHOS: 49 U/L (ref 38–126)
ALT: 59 U/L — AB (ref 14–54)
AST: 21 U/L (ref 15–41)
Albumin: 2.7 g/dL — ABNORMAL LOW (ref 3.5–5.0)
Anion gap: 5 (ref 5–15)
BILIRUBIN TOTAL: 0.4 mg/dL (ref 0.3–1.2)
BUN: 21 mg/dL — AB (ref 6–20)
CALCIUM: 8.3 mg/dL — AB (ref 8.9–10.3)
CO2: 37 mmol/L — ABNORMAL HIGH (ref 22–32)
CREATININE: 0.54 mg/dL (ref 0.44–1.00)
Chloride: 95 mmol/L — ABNORMAL LOW (ref 101–111)
Glucose, Bld: 82 mg/dL (ref 65–99)
Potassium: 4.3 mmol/L (ref 3.5–5.1)
Sodium: 137 mmol/L (ref 135–145)
TOTAL PROTEIN: 4.8 g/dL — AB (ref 6.5–8.1)

## 2016-05-07 MED ORDER — DILTIAZEM HCL ER COATED BEADS 180 MG PO CP24: 180.0000 mg | ORAL_CAPSULE | Freq: Every day | ORAL | Status: DC

## 2016-05-07 MED ORDER — DILTIAZEM HCL ER 60 MG PO CP12
60.0000 mg | ORAL_CAPSULE | Freq: Once | ORAL | Status: AC
  Administered 2016-05-07: 60 mg via ORAL
  Filled 2016-05-07: qty 1

## 2016-05-07 MED ORDER — DILTIAZEM HCL ER COATED BEADS 180 MG PO CP24: 180.0000 mg | ORAL_CAPSULE | Freq: Every day | ORAL | 0 refills | Status: DC

## 2016-05-07 MED ORDER — FUROSEMIDE 40 MG PO TABS: 40.0000 mg | ORAL_TABLET | ORAL | 0 refills | Status: DC

## 2016-05-07 NOTE — Clinical Social Work Placement (Signed)
Patient is set to discharge to [facility] SNF today. Patient & son, Sheri Ayala made aware. Discharge packet given to RN, Blane Ohara. [facility] will be called for transport once insurance authorization obtained.     Raynaldo Opitz, [facility] Clinical Social Worker cell #: [phone]    CLINICAL SOCIAL WORK PLACEMENT  NOTE  Date:  05/07/2016  Patient Details  Name: Sheri Ayala MRN: [id number] Date of Birth: 05-Sep-1950  Clinical Social Work is seeking post-discharge placement for this patient at the [place] level of care (*CSW will initial, date and re-position this form in  chart as items are completed):      Patient/family provided with [place] Work Department's list of facilities offering this level of care within the geographic area requested by the patient (or if unable, by the patient's family).  Yes   Patient/family informed of their freedom to choose among providers that offer the needed level of care, that participate in Medicare, Medicaid or managed care program needed by the patient, have an available bed and are willing to accept the patient.  Yes   Patient/family informed of [facility]'s ownership interest in [facility] and [facility], as well as of the fact that they are under no obligation to receive care at these facilities.  PASRR submitted to EDS on 05/03/16     PASRR number received on 05/03/16     Existing PASRR number confirmed on       FL2 transmitted to all facilities in geographic area requested by pt/family on 05/03/16     FL2 transmitted to all facilities within larger geographic area on       Patient informed that his/her managed care company has contracts with or will negotiate with certain facilities, including the following:        Yes   Patient/family informed of bed offers received.  Patient chooses bed at [facility]     Physician recommends and patient chooses bed at       Patient to be transferred to [facility] on 05/07/16.  Patient to be transferred to facility by [facility]     Patient family notified on 05/07/16 of transfer.  Name of family member notified:  patient's son, Sheri Ayala via phone     PHYSICIAN       Additional Comment:    _______________________________________________ Standley Brooking, LCSW 05/07/2016, 12:25 PM

## 2016-05-07 NOTE — Progress Notes (Addendum)
Attempted to call report at SNF.  Let a number for nurse to call me back.  Pt ready for transport.    Pt wheeled out on stretcher via [facility].

## 2016-05-07 NOTE — Progress Notes (Signed)
Physical Therapy Treatment Patient Details Name: Sheri Ayala MRN: [id number] DOB: 1950/07/18 Today's Date: 05/07/2016    History of Present Illness Sheri Ayala is a 66 y.o. female with PMH significant for COPD (on chronic oxygen supplementation, CLL, tobacco abuse , depression, anxiety, left lung mass and hx of CVA (w/o residual deficit); presented with worsening SOB, subjective fever and palpitations. patient CXR checked and found to have PNA; ECG- afib with RVR.Patient just recently admitted for bronchoscopy and biopsy of her left lung mass; subsequently developing COPD exacerbation and resp failure; s/p thoracentesis 05/03/16    PT Comments    Progressing with mobility. HR up to 164 bpm during ambulation-RN aware. O2 sats 92% on 2L O2. Pt continues to fatigue fairly easily. Ambulation remains somewhat effortful with increased distance. Continue to recommend SNF for rehab to improve general strength, activity tolerance, and gait and balance.    Follow Up Recommendations  SNF     Equipment Recommendations  None recommended by PT    Recommendations for Other Services       Precautions / Restrictions Precautions Precautions: Fall Precaution Comments: monitor oxygen, on 2L baseline Restrictions Weight Bearing Restrictions: No    Mobility  Bed Mobility Overal bed mobility: Independent                Transfers Overall transfer level: Needs assistance Equipment used: Rolling walker (2 wheeled) Transfers: Sit to/from Stand Sit to Stand: Min guard         General transfer comment: close guard for safety. VCs hand placement  Ambulation/Gait Ambulation/Gait assistance: Min guard Ambulation Distance (Feet): 100 Feet Assistive device: Rolling walker (2 wheeled) Gait Pattern/deviations: Step-through pattern;Decreased stride length     General Gait Details: HR up to 164 bpm, O2 sats 92% on 2L O2 during ambulation. Dyspnea 2/4.    Stairs            Wheelchair  Mobility    Modified Rankin (Stroke Patients Only)       Balance                                    Cognition Arousal/Alertness: Awake/alert Behavior During Therapy: WFL for tasks assessed/performed Overall Cognitive Status: Within Functional Limits for tasks assessed                      Exercises      General Comments        Pertinent Vitals/Pain Pain Assessment: No/denies pain    Home Living                      Prior Function            PT Goals (current goals can now be found in the care plan section) Progress towards PT goals: Progressing toward goals    Frequency    Min 3X/week      PT Plan Current plan remains appropriate    Co-evaluation             End of Session Equipment Utilized During Treatment: Oxygen;Gait belt Activity Tolerance: Patient tolerated treatment well Patient left: in bed;with call bell/phone within reach     Time: 1145-1159 PT Time Calculation (min) (ACUTE ONLY): 14 min  Charges:  $Gait Training: 8-22 mins                    G  Codes:      Weston Anna, MPT Pager: [phone]

## 2016-05-07 NOTE — Progress Notes (Signed)
Pt HR sustaining in the 150's-160's when walking in the hall with physical therapy.  HR came down to the low 100's when back in bed.  MD made aware.

## 2016-05-07 NOTE — Progress Notes (Signed)
Name: Sheri Ayala MRN:   [id number] DOB:   02-Jan-1951           ADMISSION DATE:  04/30/2016 CONSULTATION DATE:  1/16  REFERRING MD :  Lonny Prude   CHIEF COMPLAINT:  Acute on chronic respiratory failure   BRIEF PATIENT DESCRIPTION:  66 year old female followed by Dr Melvyn Novas. 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 Re-admitted 1/15 w/ HCAP  SIGNIFICANT EVENTS  1/4 discharged s/p non-diagnostic FOB of LLL  1/16 re-admitted w/ HCAP  1/18 right thora w/ 750 ml/transudate  STUDIES:  sputum 1/18 few gnc coccobacilli and rare gpc pairs. RVP negative   Site: right Date: 1/18  Pleural fluid Serum   LDH: 36 LDH: 127  Protein: <3 Protein: 4.9  Cholesterol:  Cholesterol:  Afb:   Gm stain:    Culture:abundant wbc; no orgs>>>   Fungal:    Hct:    Color: straw colored/clear   Wbc: 617   Neutrophils: 58   Lymph: 24   Ph:    Rheumatoid factor:   Adenosine Deaminase:   Glucose: 136   Cytology:       Special notes:  715m    Subjective Feeling better Objective BP (!) 93/58   Pulse 88   Temp 97.8 F (36.6 C) (Oral)   Resp 18   Ht '5\' 10"'$  (1.778 m)   Wt 57.8 kg (127 lb 6.4 oz)   SpO2 99%   BMI 18.28 kg/m   On 2 liters [place]  Intake/Output Summary (Last 24 hours) at 05/07/16 1041 Last data filed at 05/07/16 0415  Gross per 24 hour  Intake              960 ml  Output                0 ml  Net              960 ml   General appearance:  66Year old  Female, NAD, conversant  Eyes: anicteric, moist conjunctivae; PERRL, EOMI bilaterally. Mouth:  membranes and no mucosal ulcerations; normal hard and soft palate Neck: Trachea midline; neck supple, no JVD Lungs/chest: Lungs clear CV: regular irreg , no MRGs  Abdomen: Soft, non-tender; no masses or HSM Extremities: No peripheral edema or  extremity lymphadenopathy Skin: Normal temperature, turgor and texture; no rash, ulcers or subcutaneous nodules Psych: Appropriate affect, alert and oriented to person, place and time  CBC Recent Labs     05/05/16  0518  05/06/16  0521  05/07/16  0440  WBC  19.3*  6.6  22.4*  HGB  10.3*  10.8*  11.7*  HCT  33.5*  35.6*  38.4  PLT  168  148*  171    Coag's No results for input(s): APTT, INR in the last 72 hours.  BMET Recent Labs     05/05/16  0518  05/06/16  0521  05/07/16  0440  NA  136  140  137  K  5.1  3.8  4.3  CL  95*  94*  95*  CO2  36*  39*  37*  BUN  34*  22*  21*  CREATININE  0.54  0.36*  0.54  GLUCOSE  104*  79  82    Electrolytes Recent Labs  05/05/16  0518  05/06/16  0521  05/07/16  0440  CALCIUM  8.1*  8.0*  8.3*    Sepsis Markers No results for input(s): PROCALCITON, O2SATVEN in the last 72 hours.  Invalid input(s): LACTICACIDVEN  ABG No results for input(s): PHART, PCO2ART, PO2ART in the last 72 hours.  Liver Enzymes Recent Labs     05/07/16  0440  AST  21  ALT  59*  ALKPHOS  49  BILITOT  0.4  ALBUMIN  2.7*    Cardiac Enzymes No results for input(s): TROPONINI, PROBNP in the last 72 hours.  Glucose No results for input(s): GLUCAP in the last 72 hours.  Imaging Dg Chest Port 1 View  Result Date: 05/07/2016 CLINICAL DATA:  Pleural effusion. EXAM: PORTABLE CHEST 1 VIEW COMPARISON:  05/06/2016. FINDINGS: Mediastinum hilar structures normal. Cardiomegaly with normal pulmonary vascularity. Low lung volumes. Persistent right lower lobe infiltrate with right pleural effusion. Slight improvement from prior exam. Biapical pleural thickening again noted consistent with scarring. IMPRESSION: Persistent right lower lobe infiltrate with small right pleural effusion. Slight improvement from prior exam . Electronically Signed   By: Marcello Moores  Register   On: 05/07/2016 07:02   Dg Chest Port 1 View  Result Date: 05/06/2016 CLINICAL DATA:   Follow-up pleural effusion. EXAM: PORTABLE CHEST 1 VIEW COMPARISON:  05/04/2016 FINDINGS: Unchanged heart size and mediastinal contours. Right pleural effusion with mild increase from prior, associated right basilar opacity again seen. Pulmonary to the congestion and bronchial thickening, stable. Subsegmental atelectasis at the left lung base is unchanged. No pneumothorax. IMPRESSION: Slight increase in right pleural effusion. Unchanged vascular congestion. Electronically Signed   By: Jeb Levering M.D.   On: 05/06/2016 05:35   LLL ATX. Slight reoccurrence of right effusion   I reviewed CXR myself, COPD noted.  Assessment and Plan:  Pleural effusion:  - Continue active diureses but change to every other day  - F/u imaging pending symptoms  HCAP  - Continue PO levofloxacin for 10 days  - F/u on cultures  Lung mass:  - PET scan upon discharge  - Repeat bronch with EBUS as outpatient  - F/u with PCCM as outpatient  - Hold blood thinner when biopsy time.  COPD:  - Pulmicort  - Atrovent  - IV steroids  - Prednisone with taper over 10-14 days.  Discussed with PCCM-NP.  PCCM will sign off, please call back if needed  Rush Farmer, M.D. [facility] Pulmonary/Critical Care Medicine. Pager: [phone]. After hours pager: [phone].

## 2016-05-08 ENCOUNTER — Encounter: Payer: Self-pay | Admitting: *Deleted

## 2016-05-08 ENCOUNTER — Telehealth: Payer: Self-pay | Admitting: Internal Medicine

## 2016-05-08 LAB — ADENOSIDE DEAMINASE, PLEURAL FL

## 2016-05-08 NOTE — Progress Notes (Signed)
Oncology Nurse Navigator Documentation  Oncology Nurse Navigator Flowsheets 05/08/2016  Navigator Location [facility]  Navigator Encounter Type Telephone/I called central scheduling to schedule PET scan.  I called [facility] and notified them of appt and pre-procedure instructions.   Telephone Outgoing Call  Treatment Phase Pre-Tx/Tx Discussion  Barriers/Navigation Needs Coordination of Care  Interventions Coordination of Care  Coordination of Care Appts  Acuity Level 2  Acuity Level 2 Assistance expediting appointments  Time Spent with Patient 53

## 2016-05-08 NOTE — Telephone Encounter (Signed)
-----   Message from Erick Colace, NP sent at 05/03/2016  8:54 AM EST ----- Shawn Route,  This lady needs a f/u appointment. Lets shoot for either 1/25 or 26th.  Can be with any of the NPs OR preferably Dr Melvyn Novas.  Will need a CXR in am.   Collier Salina

## 2016-05-08 NOTE — Telephone Encounter (Signed)
Spoke with the pt  She states unsure when she can come in  She is currently in rehab at [facility]   She is going to check with the nursing staff and see what can be arranged  She will call back tomorrow

## 2016-05-09 ENCOUNTER — Encounter: Payer: Self-pay | Admitting: Internal Medicine

## 2016-05-10 ENCOUNTER — Encounter: Payer: Self-pay | Admitting: Internal Medicine

## 2016-05-10 ENCOUNTER — Non-Acute Institutional Stay (SKILLED_NURSING_FACILITY): Payer: Medicare HMO | Admitting: Internal Medicine

## 2016-05-10 DIAGNOSIS — J90 Pleural effusion, not elsewhere classified: Secondary | ICD-10-CM

## 2016-05-10 DIAGNOSIS — R918 Other nonspecific abnormal finding of lung field: Secondary | ICD-10-CM

## 2016-05-10 DIAGNOSIS — E0781 Sick-euthyroid syndrome: Secondary | ICD-10-CM

## 2016-05-10 DIAGNOSIS — J9621 Acute and chronic respiratory failure with hypoxia: Secondary | ICD-10-CM

## 2016-05-10 DIAGNOSIS — J441 Chronic obstructive pulmonary disease with (acute) exacerbation: Secondary | ICD-10-CM | POA: Diagnosis not present

## 2016-05-10 DIAGNOSIS — E43 Unspecified severe protein-calorie malnutrition: Secondary | ICD-10-CM

## 2016-05-10 DIAGNOSIS — R531 Weakness: Secondary | ICD-10-CM | POA: Diagnosis not present

## 2016-05-10 DIAGNOSIS — F329 Major depressive disorder, single episode, unspecified: Secondary | ICD-10-CM

## 2016-05-10 DIAGNOSIS — J189 Pneumonia, unspecified organism: Secondary | ICD-10-CM | POA: Diagnosis not present

## 2016-05-10 DIAGNOSIS — C9110 Chronic lymphocytic leukemia of B-cell type not having achieved remission: Secondary | ICD-10-CM | POA: Diagnosis not present

## 2016-05-10 DIAGNOSIS — I4891 Unspecified atrial fibrillation: Secondary | ICD-10-CM

## 2016-05-10 DIAGNOSIS — M542 Cervicalgia: Secondary | ICD-10-CM

## 2016-05-10 NOTE — Progress Notes (Signed)
LOCATION: [facility]  PCP: Alvester Chou, NP   Code Status: Full Code  Goals of care: Advanced Directive information Advanced Directives 04/30/2016  Does Patient Have a Medical Advance Directive? Yes  Type of Paramedic of [place];Living will  Does patient want to make changes to medical advance directive? No - Patient declined  Copy of [place] in Chart? No - copy requested  Would patient like information on creating a medical advance directive? -  Pre-existing out of facility DNR order (yellow form or pink MOST form) -       Extended Emergency Contact Information Primary Emergency Contact: Lawrence,Robert Address: [place] [address] [place] [address] [place] Phone: [phone] Relation: Son   Allergies  Allergen Reactions  . Citrus     Burning in stomach   . Doxycycline     Caused pt daily headaches to be more severe   . Penicillins Hives, Nausea And Vomiting and Swelling    Patient reports tolerating ampicillin.  Has patient had a PCN reaction causing immediate rash, facial/tongue/throat swelling, SOB or lightheadedness with hypotension: Yes Has patient had a PCN reaction causing severe rash involving mucus membranes or skin necrosis: unknown Has patient had a PCN reaction that required hospitalization No Has patient had a PCN reaction occurring within the last 10 years: No If all of the above answers are "NO", then may proceed with Cephalosporin use.     Chief Complaint  Patient presents with  . New Admit To SNF    New Admission Visit      HPI:  Patient is a 66 y.o. female seen today for short term rehabilitation post hospital [facility] with acute on chronic respiratory failure from healthcare acquired pneumonia and COPD exacerbation. She also had A. fib with RVR. She was started on IV antibiotic, steroid and breathing treatment and Cardizem  drip. She was noted to have right-sided pleural effusion and underwent thoracocentesis is on 18th of January 2018. Findings were concerning for transudate thought to be from hepatic congestion from cor pulmonale. She required diuresis. She was seen by cardiology and pulmonology this hospitalization. Of note she was recently diagnosed to have left lung mass and underwent biopsy for further evaluation and during the procedure became hypoxic and required hospital admission for COPD exacerbation. She was discharged from the hospital prior to this on fifth of January 2018.Marland Kitchen She has medical history of COPD, CLL, depression, anxiety, history of CVA among others. She is seen in her room today.  Review of Systems:  Constitutional: Negative for fever, chills, diaphoresis.  HENT: Negative for congestion, nasal discharge, sore throat, difficulty swallowing. Positive for occasional headaches.  Eyes: Negative for eye pain, blurred vision, double vision and discharge. Wears glasses.  Respiratory: Negative for wheezing. Positive for dry cough and dyspnea with exertion. Breathing overall improved compared to hospitalization.   Cardiovascular: Negative for chest pain,leg swelling. Positive for palpitations.  Gastrointestinal: Negative for nausea, vomiting, abdominal pain, loss of appetite, melena, diarrhea and constipation. Positive for occasional heartburn. Last bowel movement was yesterday.  Genitourinary: Negative for dysuria.  Musculoskeletal: Negative for back pain, fall in the facility.  Skin: Negative for itching, rash.  Neurological: Positive for dizziness with change of position. Psychiatric/Behavioral: Negative for depression.   Past Medical History:  Diagnosis Date  . Allergy   . Anxiety   . Asthma   . Atrial  fibrillation ([place]) 04/2016  . Bronchospasm   . Cerebrovascular accident (CVA) ([place]) 08/28/2015  . Chronic lymphocytic leukemia ([place]) 2004  . COPD (chronic obstructive pulmonary disease) ([place])    . Depression   . Emphysema of lung ([place])   . Frontal sinusitis November 2012  . H/O viral encephalitis   . Headache(784.0)   . Heart murmur   . Mitral prolapse 11/24/2015  . Mitral regurgitation 11/24/2015  . Olecranon bursitis of left elbow November 2012.   in past  . Pleurisy   . Pneumonia    13 times  . Rhinitis   . Thrombocytopenia ([place])   . Tobacco abuse   . Tricuspid valve regurgitation 11/24/2015   Past Surgical History:  Procedure Laterality Date  . ABDOMINAL HYSTERECTOMY    . COLONOSCOPY    . UPPER GASTROINTESTINAL ENDOSCOPY    . VIDEO BRONCHOSCOPY Bilateral 04/18/2016   Procedure: VIDEO BRONCHOSCOPY WITHOUT FLUORO;  Surgeon: Tanda Rockers, MD;  Location: [facility];  Service: Cardiopulmonary;  Laterality: Bilateral;   Social History:   reports that she has been smoking Cigarettes and E-cigarettes.  She has a 22.50 pack-year smoking history. She has never used smokeless tobacco. She reports that she does not drink alcohol or use drugs.  Family History  Problem Relation Age of Onset  . Aneurysm Mother   . Liver disease Father   . Congestive Heart Failure Brother   . Heart disease Brother   . Colon cancer Neg Hx   . Esophageal cancer Neg Hx   . Pancreatic cancer Neg Hx   . Rectal cancer Neg Hx   . Stomach cancer Neg Hx     Medications: Allergies as of 05/10/2016      Reactions   Citrus    Burning in stomach    Doxycycline    Caused pt daily headaches to be more severe    Penicillins Hives, Nausea And Vomiting, Swelling   Patient reports tolerating ampicillin. Has patient had a PCN reaction causing immediate rash, facial/tongue/throat swelling, SOB or lightheadedness with hypotension: Yes Has patient had a PCN reaction causing severe rash involving mucus membranes or skin necrosis: unknown Has patient had a PCN reaction that required hospitalization No Has patient had a PCN reaction occurring within the last 10 years: No If all of the above answers are  "NO", then may proceed with Cephalosporin use.      Medication List       Accurate as of 05/10/16  2:22 PM. Always use your most recent med list.          acetaminophen 325 MG tablet Commonly known as:  TYLENOL Take 650 mg by mouth every 4 (four) hours as needed. Sop date 05/10/16   albuterol (2.5 MG/3ML) 0.083% nebulizer solution Commonly known as:  PROVENTIL Take 3 mLs (2.5 mg total) by nebulization every 4 (four) hours as needed for wheezing or shortness of breath.   VENTOLIN HFA 108 (90 Base) MCG/ACT inhaler Generic drug:  albuterol Inhale 2 puffs into the lungs every 6 (six) hours as needed for shortness of breath.   ALPRAZolam 0.5 MG tablet Commonly known as:  XANAX Take 1 tablet (0.5 mg total) by mouth 2 (two) times daily as needed for anxiety or sleep.   apixaban 5 MG Tabs tablet Commonly known as:  ELIQUIS Take 1 tablet (5 mg total) by mouth 2 (two) times daily.   budesonide-formoterol 160-4.5 MCG/ACT inhaler Commonly known as:  SYMBICORT Take 2 puffs first thing in am and then  another 2 puffs about 12 hours later.   cyclobenzaprine 5 MG tablet Commonly known as:  FLEXERIL Take 1 tablet (5 mg total) by mouth 3 (three) times daily.   diltiazem 180 MG 24 hr capsule Commonly known as:  CARDIZEM CD Take 1 capsule (180 mg total) by mouth daily.   feeding supplement (ENSURE ENLIVE) Liqd Take 237 mLs by mouth 2 (two) times daily between meals.   furosemide 40 MG tablet Commonly known as:  LASIX Take 1 tablet (40 mg total) by mouth every other day.   levofloxacin 750 MG tablet Commonly known as:  LEVAQUIN Take 1 tablet (750 mg total) by mouth daily. X 3 days   OXYGEN Inhale 2 L into the lungs continuous.   polyethylene glycol packet Commonly known as:  MIRALAX / GLYCOLAX Take 17 g by mouth daily.   predniSONE 10 MG tablet Commonly known as:  DELTASONE Take 30 mg (3 tabs) daily x 3 days, then 20 mg (2 tabs) daily x 3 days, then 10 mg (1 tab) daily x 3  days   sertraline 25 MG tablet Commonly known as:  ZOLOFT Take 25 mg by mouth daily.   temazepam 30 MG capsule Commonly known as:  RESTORIL Take 1 capsule (30 mg total) by mouth at bedtime.       Immunizations: Immunization History  Administered Date(s) Administered  . Influenza Split 03/10/2011  . Influenza,inj,Quad PF,36+ Mos 12/25/2012, 01/30/2015  . PPD Test 05/07/2016  . Pneumococcal Conjugate-13 05/06/2015  . Pneumococcal Polysaccharide-23 12/25/2012     Physical Exam: Vitals:   05/10/16 1415  BP: 116/76  Pulse: 85  Resp: 19  Temp: 97.3 F (36.3 C)  TempSrc: Oral  SpO2: 94%  Weight: 128 lb 4.8 oz (58.2 kg)  Height: '5\' 10"'$  (1.778 m)   Body mass index is 18.41 kg/m.  General- elderly female, Frail, thin built, in no acute distress Head- normocephalic, atraumatic Nose- no maxillary or frontal sinus tenderness, no nasal discharge Throat- moist mucus membrane, has upper denture Eyes- PERRLA, EOMI, no pallor, no icterus, no discharge, normal conjunctiva, normal sclera Neck- no cervical lymphadenopathy Cardiovascular- irregular heart rate, no murmur Respiratory- bilateral decreased air entry, no wheeze, no rhonchi, no crackles, no use of accessory muscles Abdomen- bowel sounds present, soft, non tender, no guarding or rigidity, no CVA tenderness Musculoskeletal- able to move all 4 extremities, generalized weakness, no leg edema Neurological- alert and oriented to person, place and time Skin- warm and dry, bruising to both arms Psychiatry- normal mood and affect    Labs reviewed: Basic Metabolic Panel:  Recent Labs  04/30/16 2138  05/05/16 0518 05/06/16 0521 05/07/16 0440  NA  --   < > 136 140 137  K  --   < > 5.1 3.8 4.3  CL  --   < > 95* 94* 95*  CO2  --   < > 36* 39* 37*  GLUCOSE  --   < > 104* 79 82  BUN  --   < > 34* 22* 21*  CREATININE  --   < > 0.54 0.36* 0.54  CALCIUM  --   < > 8.1* 8.0* 8.3*  MG 1.7  --   --   --   --   < > = values in  this interval not displayed. Liver Function Tests:  Recent Labs  04/18/16 1435 05/03/16 1403 05/07/16 0440  AST 21  --  21  ALT 11*  --  59*  ALKPHOS 57  --  49  BILITOT 0.5  --  0.4  PROT 6.5 4.9* 4.8*  ALBUMIN 4.0  --  2.7*   No results for input(s): LIPASE, AMYLASE in the last 8760 hours. No results for input(s): AMMONIA in the last 8760 hours. CBC:  Recent Labs  09/26/15 0927  05/05/16 0518 05/06/16 0521 05/07/16 0440  WBC 26.4*  < > 19.3* 6.6 22.4*  NEUTROABS 5.3  --   --   --   --   HGB 13.2  < > 10.3* 10.8* 11.7*  HCT 41.7  < > 33.5* 35.6* 38.4  MCV 93.6  < > 96.5 97.3 95.0  PLT 96*  < > 168 148* 171  < > = values in this interval not displayed. Cardiac Enzymes:  Recent Labs  09/24/15 1830  TROPONINI <0.03   BNP: Invalid input(s): POCBNP CBG: No results for input(s): GLUCAP in the last 8760 hours.  Radiological Exams: Dg Chest Port 1 View  Result Date: 05/07/2016 CLINICAL DATA:  Pleural effusion. EXAM: PORTABLE CHEST 1 VIEW COMPARISON:  05/06/2016. FINDINGS: Mediastinum hilar structures normal. Cardiomegaly with normal pulmonary vascularity. Low lung volumes. Persistent right lower lobe infiltrate with right pleural effusion. Slight improvement from prior exam. Biapical pleural thickening again noted consistent with scarring. IMPRESSION: Persistent right lower lobe infiltrate with small right pleural effusion. Slight improvement from prior exam . Electronically Signed   By: Marcello Moores  Register   On: 05/07/2016 07:02   Dg Chest Port 1 View  Result Date: 05/06/2016 CLINICAL DATA:  Follow-up pleural effusion. EXAM: PORTABLE CHEST 1 VIEW COMPARISON:  05/04/2016 FINDINGS: Unchanged heart size and mediastinal contours. Right pleural effusion with mild increase from prior, associated right basilar opacity again seen. Pulmonary to the congestion and bronchial thickening, stable. Subsegmental atelectasis at the left lung base is unchanged. No pneumothorax. IMPRESSION:  Slight increase in right pleural effusion. Unchanged vascular congestion. Electronically Signed   By: Jeb Levering M.D.   On: 05/06/2016 05:35   Dg Chest Port 1 View  Result Date: 05/04/2016 CLINICAL DATA:  Shortness of breath, pleural effusion, followup, history COPD EXAM: PORTABLE CHEST 1 VIEW COMPARISON:  Portable exam 0913 hours compared 05/03/2016 FINDINGS: Upper normal heart size. Slight pulmonary vascular congestion. Mediastinal contour stable. Mild LEFT basilar atelectasis with persistent atelectasis and pleural effusion at the RIGHT lung base, slightly increased. Underlying emphysematous and bronchitic changes. No pneumothorax. Bones demineralized. IMPRESSION: COPD changes with increased RIGHT basilar atelectasis and small pleural effusion as well as persistent mild subsegmental atelectasis at LEFT base. Electronically Signed   By: Lavonia Dana M.D.   On: 05/04/2016 09:34   Dg Chest Port 1 View  Result Date: 05/03/2016 CLINICAL DATA:  Status post right thoracentesis. EXAM: PORTABLE CHEST 1 VIEW COMPARISON:  05/03/2016 at 9 a.m. FINDINGS: There is decreased opacity at the right lung base consistent with the interval removal of pleural fluid. Residual opacity is consistent with combination of atelectasis or pneumonia with a small residual pleural effusion. There is no pneumothorax. Reticular and linear opacity at the left lung base, likely combination scarring atelectasis, stable. There stable apical pleuroparenchymal scarring. IMPRESSION: 1. No evidence of a complication following right thoracentesis. No pneumothorax. 2. Significant decrease in right pleural fluid noted within overall decrease right basilar opacity. 3. No other change. Electronically Signed   By: Lajean Manes M.D.   On: 05/03/2016 12:39   Dg Chest Port 1 View  Result Date: 05/03/2016 CLINICAL DATA:  Follow-up pneumonia EXAM: PORTABLE CHEST 1 VIEW COMPARISON:  04/30/2016 FINDINGS: Cardiomediastinal  silhouette is stable.  Worsening infiltrate/ pneumonia in right lower lobe with small right pleural effusion. Persistent streaky left basilar atelectasis or infiltrate. No pulmonary edema. IMPRESSION: Worsening infiltrate/ pneumonia in right lower lobe with small right pleural effusion. Persistent streaky left basilar atelectasis or infiltrate. No pulmonary edema. Electronically Signed   By: Lahoma Crocker M.D.   On: 05/03/2016 09:49   Dg Chest Portable 1 View  Result Date: 04/30/2016 CLINICAL DATA:  Shortness of breath and chest pain today. Productive cough, nausea and fever. EXAM: PORTABLE CHEST 1 VIEW COMPARISON:  Single-view of the chest 04/18/2016. CT chest 03/27/2016. FINDINGS: The lungs are emphysematous. There is right worse than left basilar airspace disease. Heart size is normal. No pneumothorax or pleural effusion. IMPRESSION: Right worse left basilar airspace disease has an appearance most consistent with pneumonia. Emphysema. Electronically Signed   By: Inge Rise M.D.   On: 04/30/2016 12:19   Dg Chest Port 1 View  Result Date: 04/18/2016 CLINICAL DATA:  Bronchoscopy.  Left perihilar mass EXAM: PORTABLE CHEST 1 VIEW COMPARISON:  CT 03/27/2016. FINDINGS: Mediastinum is stable. Persistent left perihilar fullness consistent with previously identified mass. Lungs are otherwise clear. No pleural effusion or pneumothorax. Biapical pleural thickening noted consistent with scarring. IMPRESSION: Persistent left perihilar fullness consistent with previously identified perihilar mass. Electronically Signed   By: [place]   On: 04/18/2016 11:13    Assessment/Plan  Generalized weakness From deconditioning. Will need to work with physical therapy and occupational therapy team to help regain and restore her strength and balance.  Acute on chronic respiratory failure with hypoxia With recent episodes of healthcare acquired pneumonia and COPD exacerbation. Was placed on oxygen in the hospital and this is to be weaned  off as tolerated. Patient used oxygen on a needed basis only at home.  Healthcare acquired pneumonia Breathing currently stable. Patient mentions feeling symptomatically better. Continue and complete course of Levaquin on 05/10/2016.   COPD Breathing stable. Continue and complete course of prednisone on 05/16/2016. Monitor her breathing. Continue her bronchodilators. Continue oxygen on a needed basis.  Right pleural effusion Thought to be from chronic hepatic congestion from cor pulmonale. Status post thoracocentesis with removal of fluid. Currently on Lasix  Atrial fibrillation New diagnosis. Note seen by cardiology in the hospital. Continue diltiazem 180 mg daily. Continue Kapidex the back for anticoagulation.  Neck pain Thought to be from cervical strain. Continue Flexeril for now and monitor. 2. Evaluated by therapy team.  Euthyroid sick syndrome TSH from hospital reviewed. Repeat TSH in 4-5 weeks.  Severe malnutrition Will be evaluated by registered dietitian. Monitor oral intake and weight.  Chronic lymphocytic leukemia Stable. Oncology follow-up.  Left lung mass Status post bronchoscopy and biopsy with finding negative for malignancy. Will need further follow-up with pulmonology.  Chronic depression Mood has been stable. Continue sertraline 25 mg daily, temazepam 30 mg daily and when necessary alprazolam. Monitor for sedation.   Goals of care: short term rehabilitation   Labs/tests ordered: ABC, BMP 05/14/2016  Family/ staff Communication: reviewed care plan with patient and nursing supervisor    Blanchie Serve, MD Internal Medicine [place], [place] [address] Cell Phone (Monday-Friday 8 am - 5 pm): [phone] On Call: [phone] and follow prompts after 5 pm and on weekends Office Phone: [phone] Office Fax: [phone]

## 2016-05-11 ENCOUNTER — Non-Acute Institutional Stay (SKILLED_NURSING_FACILITY): Payer: Medicare HMO | Admitting: Family

## 2016-05-11 DIAGNOSIS — D72829 Elevated white blood cell count, unspecified: Secondary | ICD-10-CM

## 2016-05-11 LAB — CBC AND DIFFERENTIAL
HCT: 42 % (ref 36–46)
HEMOGLOBIN: 13.1 g/dL (ref 12.0–16.0)
Platelets: 154 10*3/uL (ref 150–399)
WBC: 33.9 10*3/mL

## 2016-05-11 LAB — BASIC METABOLIC PANEL
BUN: 20 mg/dL (ref 4–21)
CREATININE: 0.5 mg/dL (ref 0.5–1.1)
GLUCOSE: 75 mg/dL
POTASSIUM: 4.2 mmol/L (ref 3.4–5.3)
SODIUM: 142 mmol/L (ref 137–147)

## 2016-05-11 NOTE — Progress Notes (Signed)
Location:  [facility] Room Number: [place] of Service:  SNF [phone]) Provider: Neamiah Sciarra FNP-C   Alvester Chou, NP  Patient Care Team: Alvester Chou, NP as PCP - General (Nurse Practitioner)  Extended Emergency Contact Information Primary Emergency Contact: Lorella Nimrod Address: [address], [place] [address] [place] Phone: [phone] Relation: Son  Code Status:  Full Code  Goals of care: Advanced Directive information Advanced Directives 04/30/2016  Does Patient Have a Medical Advance Directive? Yes  Type of Paramedic of [place];Living will  Does patient want to make changes to medical advance directive? No - Patient declined  Copy of [place] in Chart? No - copy requested  Would patient like information on creating a medical advance directive? -  Pre-existing out of facility DNR order (yellow form or pink MOST form) -     Chief Complaint  Patient presents with  . Acute Visit    abnormal labs results     HPI:  Pt is a 66 y.o. female seen today at [facility] for an acute visit for evaluation of abnormal lab results. She has a medical history of COPD, Afib, CLL among other conditions. She is seen in her room today in bed watching TV. She states feeling much stronger than what she has felt in the past several weeks. Cough has resolved. Her recent lab results showed WBC 33.9 ( 05/11/2016) without left shift.Previous WBC 22.4. She is currently on Tapered prednisone and oral Levaquin.She denies any urinary infections symptoms. She denies any fever, chills or shortness of breath. She continues to participate in Physical therapy. Patient's lab results discussed with DR. Pandey recommended monitoring for now. Patient asymptomatic suspect possible CLL and currently on prednisone. Facility staff reports no new concerns.    Past Medical History:    Diagnosis Date  . Allergy   . Anxiety   . Asthma   . Atrial fibrillation ([place]) 04/2016  . Bronchospasm   . Cerebrovascular accident (CVA) ([place]) 08/28/2015  . Chronic lymphocytic leukemia ([place]) 2004  . COPD (chronic obstructive pulmonary disease) ([place])   . Depression   . Emphysema of lung ([place])   . Frontal sinusitis November 2012  . H/O viral encephalitis   . Headache(784.0)   . Heart murmur   . Mitral prolapse 11/24/2015  . Mitral regurgitation 11/24/2015  . Olecranon bursitis of left elbow November 2012.   in past  . Pleurisy   . Pneumonia    13 times  . Rhinitis   . Thrombocytopenia ([place])   . Tobacco abuse   . Tricuspid valve regurgitation 11/24/2015   Past Surgical History:  Procedure Laterality Date  . ABDOMINAL HYSTERECTOMY    . COLONOSCOPY    . UPPER GASTROINTESTINAL ENDOSCOPY    . VIDEO BRONCHOSCOPY Bilateral 04/18/2016   Procedure: VIDEO BRONCHOSCOPY WITHOUT FLUORO;  Surgeon: Tanda Rockers, MD;  Location: [facility] ENDOSCOPY;  Service: Cardiopulmonary;  Laterality: Bilateral;    Allergies  Allergen Reactions  . Citrus     Burning in stomach   . Doxycycline     Caused pt daily headaches to be more severe   . Penicillins Hives, Nausea And Vomiting and Swelling    Patient reports tolerating ampicillin.  Has patient had a PCN reaction causing immediate rash, facial/tongue/throat swelling, SOB or lightheadedness with hypotension: Yes Has patient had a PCN reaction  causing severe rash involving mucus membranes or skin necrosis: unknown Has patient had a PCN reaction that required hospitalization No Has patient had a PCN reaction occurring within the last 10 years: No If all of the above answers are "NO", then may proceed with Cephalosporin use.     Allergies as of 05/11/2016      Reactions   Citrus    Burning in stomach    Doxycycline    Caused pt daily headaches to be more severe    Penicillins Hives, Nausea And Vomiting, Swelling   Patient reports tolerating  ampicillin. Has patient had a PCN reaction causing immediate rash, facial/tongue/throat swelling, SOB or lightheadedness with hypotension: Yes Has patient had a PCN reaction causing severe rash involving mucus membranes or skin necrosis: unknown Has patient had a PCN reaction that required hospitalization No Has patient had a PCN reaction occurring within the last 10 years: No If all of the above answers are "NO", then may proceed with Cephalosporin use.      Medication List       Accurate as of 05/11/16  5:28 PM. Always use your most recent med list.          acetaminophen 325 MG tablet Commonly known as:  TYLENOL Take 650 mg by mouth every 4 (four) hours as needed. Sop date 05/10/16   albuterol (2.5 MG/3ML) 0.083% nebulizer solution Commonly known as:  PROVENTIL Take 3 mLs (2.5 mg total) by nebulization every 4 (four) hours as needed for wheezing or shortness of breath.   VENTOLIN HFA 108 (90 Base) MCG/ACT inhaler Generic drug:  albuterol Inhale 2 puffs into the lungs every 6 (six) hours as needed for shortness of breath.   ALPRAZolam 0.5 MG tablet Commonly known as:  XANAX Take 1 tablet (0.5 mg total) by mouth 2 (two) times daily as needed for anxiety or sleep.   apixaban 5 MG Tabs tablet Commonly known as:  ELIQUIS Take 1 tablet (5 mg total) by mouth 2 (two) times daily.   budesonide-formoterol 160-4.5 MCG/ACT inhaler Commonly known as:  SYMBICORT Take 2 puffs first thing in am and then another 2 puffs about 12 hours later.   cyclobenzaprine 5 MG tablet Commonly known as:  FLEXERIL Take 1 tablet (5 mg total) by mouth 3 (three) times daily.   diltiazem 180 MG 24 hr capsule Commonly known as:  CARDIZEM CD Take 1 capsule (180 mg total) by mouth daily.   feeding supplement (ENSURE ENLIVE) Liqd Take 237 mLs by mouth 2 (two) times daily between meals.   furosemide 40 MG tablet Commonly known as:  LASIX Take 1 tablet (40 mg total) by mouth every other day.     levofloxacin 750 MG tablet Commonly known as:  LEVAQUIN Take 1 tablet (750 mg total) by mouth daily. X 3 days   OXYGEN Inhale 2 L into the lungs continuous.   polyethylene glycol packet Commonly known as:  MIRALAX / GLYCOLAX Take 17 g by mouth daily.   predniSONE 10 MG tablet Commonly known as:  DELTASONE Take 30 mg (3 tabs) daily x 3 days, then 20 mg (2 tabs) daily x 3 days, then 10 mg (1 tab) daily x 3 days   sertraline 25 MG tablet Commonly known as:  ZOLOFT Take 25 mg by mouth daily.   temazepam 30 MG capsule Commonly known as:  RESTORIL Take 1 capsule (30 mg total) by mouth at bedtime.       Review of Systems  Constitutional: Negative for  activity change, appetite change, chills, fatigue and fever.  HENT: Negative for congestion, rhinorrhea, sinus pain, sinus pressure and sneezing.   Eyes: Negative.   Respiratory: Negative for cough, chest tightness, shortness of breath and wheezing.   Cardiovascular: Negative for chest pain, palpitations and leg swelling.  Gastrointestinal: Negative for abdominal distention, abdominal pain, constipation, diarrhea, nausea and vomiting.  Endocrine: Negative.   Genitourinary: Negative for dysuria, flank pain, frequency and urgency.  Musculoskeletal: Positive for gait problem.  Skin: Negative for color change, pallor, rash and wound.  Neurological: Negative for dizziness, seizures, syncope, light-headedness and headaches.  Psychiatric/Behavioral: Negative for agitation, confusion, hallucinations and sleep disturbance. The patient is not nervous/anxious.     Immunization History  Administered Date(s) Administered  . Influenza Split 03/10/2011  . Influenza,inj,Quad PF,36+ Mos 12/25/2012, 01/30/2015  . PPD Test 05/07/2016  . Pneumococcal Conjugate-13 05/06/2015  . Pneumococcal Polysaccharide-23 12/25/2012   Pertinent  Health Maintenance Due  Topic Date Due  . MAMMOGRAM  10/10/1999  . DEXA SCAN  10/10/2014  . PNA vac Low Risk  Adult (2 of 2 - PPSV23) 12/25/2017  . COLONOSCOPY  07/11/2025  . INFLUENZA VACCINE  Completed    Vitals:   05/11/16 1700  BP: 106/61  Pulse: 89  Resp: 20  Temp: (!) 96.6 F (35.9 C)  SpO2: 98%  Weight: 128 lb 4.8 oz (58.2 kg)  Height: '5\' 10"'$  (1.778 m)   Body mass index is 18.41 kg/m. Physical Exam  Constitutional: She is oriented to person, place, and time.  Tall thin vibrant during visit.   HENT:  Head: Normocephalic.  Mouth/Throat: Oropharynx is clear and moist. No oropharyngeal exudate.  Eyes: Conjunctivae and EOM are normal. Pupils are equal, round, and reactive to light. Right eye exhibits no discharge. Left eye exhibits no discharge. No scleral icterus.  Neck: Normal range of motion. No JVD present. No thyromegaly present.  Cardiovascular: Normal rate, regular rhythm, normal heart sounds and intact distal pulses.  Exam reveals no gallop and no friction rub.   No murmur heard. Pulmonary/Chest: Effort normal and breath sounds normal. No respiratory distress. She has no wheezes. She has no rales.  Oxygen 2 liters via [place] in place.   Abdominal: Soft. Bowel sounds are normal. She exhibits no distension. There is no tenderness. There is no rebound and no guarding.  Musculoskeletal: Normal range of motion. She exhibits no edema, tenderness or deformity.  Lymphadenopathy:    She has no cervical adenopathy.  Neurological: She is oriented to person, place, and time.  Skin: Skin is warm and dry. No rash noted. No erythema. No pallor.  Psychiatric: She has a normal mood and affect.    Labs reviewed:  Recent Labs  04/30/16 2138  05/05/16 0518 05/06/16 0521 05/07/16 0440  NA  --   < > 136 140 137  K  --   < > 5.1 3.8 4.3  CL  --   < > 95* 94* 95*  CO2  --   < > 36* 39* 37*  GLUCOSE  --   < > 104* 79 82  BUN  --   < > 34* 22* 21*  CREATININE  --   < > 0.54 0.36* 0.54  CALCIUM  --   < > 8.1* 8.0* 8.3*  MG 1.7  --   --   --   --   < > = values in this interval not  displayed.  Recent Labs  04/18/16 1435 05/03/16 1403 05/07/16 0440  AST 21  --  21  ALT 11*  --  59*  ALKPHOS 57  --  49  BILITOT 0.5  --  0.4  PROT 6.5 4.9* 4.8*  ALBUMIN 4.0  --  2.7*    Recent Labs  09/26/15 0927  05/05/16 0518 05/06/16 0521 05/07/16 0440  WBC 26.4*  < > 19.3* 6.6 22.4*  NEUTROABS 5.3  --   --   --   --   HGB 13.2  < > 10.3* 10.8* 11.7*  HCT 41.7  < > 33.5* 35.6* 38.4  MCV 93.6  < > 96.5 97.3 95.0  PLT 96*  < > 168 148* 171  < > = values in this interval not displayed. Lab Results  Component Value Date   TSH 0.310 (L) 04/30/2016   No results found for: HGBA1C Lab Results  Component Value Date   CHOL 107 05/03/2016   Assessment/Plan  Leukocytosis, unspecified type Afebrile. Negative exam findings. Continue on Tapered Prednisone and Levaquin. Recheck CBC/diff, BMP on 05/14/2016. Vital signs every shift X 5 days. Monitor Temp curve.     Family/ staff Communication: Reviewed plan of care with Dr. Bubba Camp, patient and facility Nurse supervisor.   Labs/tests ordered:  CBC/diff, BMP on 05/14/2016

## 2016-05-15 ENCOUNTER — Non-Acute Institutional Stay (SKILLED_NURSING_FACILITY): Payer: Medicare HMO | Admitting: Family

## 2016-05-15 ENCOUNTER — Ambulatory Visit: Payer: Medicare HMO | Admitting: Internal Medicine

## 2016-05-15 DIAGNOSIS — G4700 Insomnia, unspecified: Secondary | ICD-10-CM

## 2016-05-15 DIAGNOSIS — F329 Major depressive disorder, single episode, unspecified: Secondary | ICD-10-CM

## 2016-05-15 DIAGNOSIS — J449 Chronic obstructive pulmonary disease, unspecified: Secondary | ICD-10-CM

## 2016-05-15 DIAGNOSIS — F32A Depression, unspecified: Secondary | ICD-10-CM

## 2016-05-15 DIAGNOSIS — F419 Anxiety disorder, unspecified: Secondary | ICD-10-CM

## 2016-05-15 DIAGNOSIS — R2681 Unsteadiness on feet: Secondary | ICD-10-CM | POA: Diagnosis not present

## 2016-05-15 DIAGNOSIS — I482 Chronic atrial fibrillation, unspecified: Secondary | ICD-10-CM

## 2016-05-15 DIAGNOSIS — K5901 Slow transit constipation: Secondary | ICD-10-CM

## 2016-05-15 LAB — CBC AND DIFFERENTIAL
HCT: 40 % (ref 36–46)
Hemoglobin: 12.4 g/dL (ref 12.0–16.0)
Platelets: 95 10*3/uL — AB (ref 150–399)
WBC: 22.1 10^3/mL

## 2016-05-15 LAB — BASIC METABOLIC PANEL
BUN: 19 mg/dL (ref 4–21)
CREATININE: 0.6 mg/dL (ref 0.5–1.1)
Glucose: 106 mg/dL
POTASSIUM: 3.7 mmol/L (ref 3.4–5.3)
SODIUM: 143 mmol/L (ref 137–147)

## 2016-05-15 NOTE — Telephone Encounter (Signed)
Pt scheduled for appt with TP a MW did not have any openings in the next week.  Scheduled 05/23/16 '@11'$ :30 with TP Nothing further needed.

## 2016-05-15 NOTE — Progress Notes (Signed)
Location:  [facility] Room Number: [place] of Service:  SNF [phone])  Provider: Marlowe Sax FNP-C   PCP: Alvester Chou, NP Patient Care Team: Alvester Chou, NP as PCP - General (Nurse Practitioner)  Extended Emergency Contact Information Primary Emergency Contact: Lorella Nimrod Address: [place] [address], [place] [address] [place] Phone: [phone] Relation: Son  Code Status: Full Code  Goals of care:  Advanced Directive information Advanced Directives 04/30/2016  Does Patient Have a Medical Advance Directive? Yes  Type of Paramedic of [place];Living will  Does patient want to make changes to medical advance directive? No - Patient declined  Copy of [place] in Chart? No - copy requested  Would patient like information on creating a medical advance directive? -  Pre-existing out of facility DNR order (yellow form or pink MOST form) -     Allergies  Allergen Reactions  . Citrus     Burning in stomach   . Doxycycline     Caused pt daily headaches to be more severe   . Penicillins Hives, Nausea And Vomiting and Swelling    Patient reports tolerating ampicillin.  Has patient had a PCN reaction causing immediate rash, facial/tongue/throat swelling, SOB or lightheadedness with hypotension: Yes Has patient had a PCN reaction causing severe rash involving mucus membranes or skin necrosis: unknown Has patient had a PCN reaction that required hospitalization No Has patient had a PCN reaction occurring within the last 10 years: No If all of the above answers are "NO", then may proceed with Cephalosporin use.     Chief Complaint  Patient presents with  . Discharge Note    discharge home     HPI:  66 y.o. female  Seen at  [facility] for discharge home. She was here for short term rehabilitation post hospital admission from the  04/30/2016-05/07/2016 with acute on chronic respiratory failure from healthcare acquired pneumonia and COPD exacerbation. She also had A. fib with RVR. She was started on IV antibiotic, steroid and breathing treatment and Cardizem drip. She was noted to have right-sided pleural effusion and underwent thoracocentesis is on 18th of January 2018. Findings were concerning for transudate thought to be from hepatic congestion from cor pulmonale.She required diuresis. She was seen by cardiology and pulmonology this hospitalization.Of note she was recently diagnosed to have left lung mass and underwent biopsy for further evaluation and during the procedure became hypoxic and required hospital admission for COPD exacerbation.She has a significant medical history of COPD, Afib, Asthma, CVA, anxiety among others. She has had unremarkable rehab stay. She has completed her Tapered prednisone. She has worked well with PT/OT now stable for discharge home.She will be discharged home with Home health PT/OT to continue with ROM, Exercise, Gait stability and muscle strengthening.She will require  DME Rollator to allow her to maintain current level of independence with ADL's.She has own oxygen at home. Home health services will be arranged by facility social worker prior to discharge. Prescription medication will be written x 1 month then patient to follow up with PCP in 1-2 weeks. Facility staff report no new concerns.      Past Medical History:  Diagnosis Date  . Allergy   . Anxiety   . Asthma   . Atrial fibrillation ([place]) 04/2016  . Bronchospasm   . Cerebrovascular accident (CVA) ([place]) 08/28/2015  .  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 COPD (chronic obstructive pulmonary disease) ([place])   . Depression   . Emphysema of lung ([place])   . Frontal sinusitis November 2012  . H/O viral encephalitis   . Headache(784.0)   . Heart murmur   . Mitral prolapse 11/24/2015  . Mitral regurgitation 11/24/2015  . Olecranon  bursitis of left elbow November 2012.   in past  . Pleurisy   . Pneumonia    13 times  . Rhinitis   . Thrombocytopenia ([place])   . Tobacco abuse   . Tricuspid valve regurgitation 11/24/2015    Past Surgical History:  Procedure Laterality Date  . ABDOMINAL HYSTERECTOMY    . COLONOSCOPY    . UPPER GASTROINTESTINAL ENDOSCOPY    . VIDEO BRONCHOSCOPY Bilateral 04/18/2016   Procedure: VIDEO BRONCHOSCOPY WITHOUT FLUORO;  Surgeon: Tanda Rockers, MD;  Location: [facility];  Service: Cardiopulmonary;  Laterality: Bilateral;      reports that she has been smoking Cigarettes and E-cigarettes.  She has a 22.50 pack-year smoking history. She has never used smokeless tobacco. She reports that she does not drink alcohol or use drugs. Social History   Social History  . Marital status: Single    Spouse name: N/A  . Number of children: N/A  . Years of education: N/A   Occupational History  . Not on file.   Social History Main Topics  . Smoking status: Current Every Day Smoker    Packs/day: 0.50    Years: 45.00    Types: Cigarettes, E-cigarettes  . Smokeless tobacco: Never Used  . Alcohol use No  . Drug use: No  . Sexual activity: Not Currently   Other Topics Concern  . Not on file   Social History Narrative  . No narrative on file    Allergies  Allergen Reactions  . Citrus     Burning in stomach   . Doxycycline     Caused pt daily headaches to be more severe   . Penicillins Hives, Nausea And Vomiting and Swelling    Patient reports tolerating ampicillin.  Has patient had a PCN reaction causing immediate rash, facial/tongue/throat swelling, SOB or lightheadedness with hypotension: Yes Has patient had a PCN reaction causing severe rash involving mucus membranes or skin necrosis: unknown Has patient had a PCN reaction that required hospitalization No Has patient had a PCN reaction occurring within the last 10 years: No If all of the above answers are "NO", then may proceed with  Cephalosporin use.     Pertinent  Health Maintenance Due  Topic Date Due  . MAMMOGRAM  10/10/1999  . DEXA SCAN  10/10/2014  . PNA vac Low Risk Adult (2 of 2 - PPSV23) 12/25/2017  . COLONOSCOPY  07/11/2025  . INFLUENZA VACCINE  Completed    Medications: Allergies as of 05/15/2016      Reactions   Citrus    Burning in stomach    Doxycycline    Caused pt daily headaches to be more severe    Penicillins Hives, Nausea And Vomiting, Swelling   Patient reports tolerating ampicillin. Has patient had a PCN reaction causing immediate rash, facial/tongue/throat swelling, SOB or lightheadedness with hypotension: Yes Has patient had a PCN reaction causing severe rash involving mucus membranes or skin necrosis: unknown Has patient had a PCN reaction that required hospitalization No Has patient had a PCN reaction occurring within the last 10 years: No If all of the above answers are "NO", then may proceed with  Cephalosporin use.      Medication List       Accurate as of 05/15/16  5:49 PM. Always use your most recent med list.          albuterol (2.5 MG/3ML) 0.083% nebulizer solution Commonly known as:  PROVENTIL Take 3 mLs (2.5 mg total) by nebulization every 4 (four) hours as needed for wheezing or shortness of breath.   VENTOLIN HFA 108 (90 Base) MCG/ACT inhaler Generic drug:  albuterol Inhale 2 puffs into the lungs every 6 (six) hours as needed for shortness of breath.   ALPRAZolam 0.5 MG tablet Commonly known as:  XANAX Take 1 tablet (0.5 mg total) by mouth 2 (two) times daily as needed for anxiety or sleep.   apixaban 5 MG Tabs tablet Commonly known as:  ELIQUIS Take 1 tablet (5 mg total) by mouth 2 (two) times daily.   budesonide-formoterol 160-4.5 MCG/ACT inhaler Commonly known as:  SYMBICORT Take 2 puffs first thing in am and then another 2 puffs about 12 hours later.   cyclobenzaprine 5 MG tablet Commonly known as:  FLEXERIL Take 1 tablet (5 mg total) by mouth 3  (three) times daily.   diltiazem 180 MG 24 hr capsule Commonly known as:  CARDIZEM CD Take 1 capsule (180 mg total) by mouth daily.   feeding supplement (ENSURE ENLIVE) Liqd Take 237 mLs by mouth 2 (two) times daily between meals.   furosemide 40 MG tablet Commonly known as:  LASIX Take 1 tablet (40 mg total) by mouth every other day.   OXYGEN Inhale 2 L into the lungs continuous.   polyethylene glycol packet Commonly known as:  MIRALAX / GLYCOLAX Take 17 g by mouth daily.   predniSONE 10 MG tablet Commonly known as:  DELTASONE Take 30 mg (3 tabs) daily x 3 days, then 20 mg (2 tabs) daily x 3 days, then 10 mg (1 tab) daily x 3 days   sertraline 25 MG tablet Commonly known as:  ZOLOFT Take 25 mg by mouth daily.   temazepam 30 MG capsule Commonly known as:  RESTORIL Take 1 capsule (30 mg total) by mouth at bedtime.       Review of Systems  Constitutional: Negative for activity change, appetite change, chills, fatigue and fever.  HENT: Negative for congestion, rhinorrhea, sinus pain, sinus pressure and sneezing.   Eyes: Negative.   Respiratory: Negative for cough, chest tightness, shortness of breath and wheezing.   Cardiovascular: Negative for chest pain, palpitations and leg swelling.  Gastrointestinal: Negative for abdominal distention, abdominal pain, constipation, diarrhea, nausea and vomiting.  Endocrine: Negative.   Genitourinary: Negative for dysuria, flank pain, frequency and urgency.  Musculoskeletal: Positive for gait problem.  Skin: Negative for color change, pallor, rash and wound.  Neurological: Negative for dizziness, seizures, syncope, light-headedness and headaches.  Psychiatric/Behavioral: Negative for agitation, confusion, hallucinations and sleep disturbance. The patient is not nervous/anxious.     Vitals:   05/15/16 1000  BP: (!) 130/53  Pulse: 76  Resp: 18  Temp: 98.2 F (36.8 C)  SpO2: 96%  Weight: 128 lb 4.8 oz (58.2 kg)  Height: '5\' 10"'$   (1.778 m)   Body mass index is 18.41 kg/m. Physical Exam  Constitutional: She is oriented to person, place, and time. She appears well-developed and well-nourished. No distress.  HENT:  Head: Normocephalic.  Mouth/Throat: Oropharynx is clear and moist. No oropharyngeal exudate.  Eyes: Conjunctivae and EOM are normal. Pupils are equal, round, and reactive to light. Right eye  exhibits no discharge. Left eye exhibits no discharge. No scleral icterus.  Neck: Normal range of motion. No JVD present. No thyromegaly present.  Cardiovascular: Normal rate, regular rhythm, normal heart sounds and intact distal pulses.  Exam reveals no gallop and no friction rub.   No murmur heard. Pulmonary/Chest: Effort normal and breath sounds normal. No respiratory distress. She has no wheezes. She has no rales.  Oxygen 2 liters via [place] in place.   Abdominal: Soft. Bowel sounds are normal. She exhibits no distension. There is no tenderness. There is no rebound and no guarding.  Musculoskeletal: Normal range of motion. She exhibits no edema, tenderness or deformity.  Lymphadenopathy:    She has no cervical adenopathy.  Neurological: She is oriented to person, place, and time.  Skin: Skin is warm and dry. No rash noted. No erythema. No pallor.  Psychiatric: She has a normal mood and affect.    Labs reviewed: Basic Metabolic Panel:  Recent Labs  04/30/16 2138  05/05/16 0518 05/06/16 0521 05/07/16 0440  NA  --   < > 136 140 137  K  --   < > 5.1 3.8 4.3  CL  --   < > 95* 94* 95*  CO2  --   < > 36* 39* 37*  GLUCOSE  --   < > 104* 79 82  BUN  --   < > 34* 22* 21*  CREATININE  --   < > 0.54 0.36* 0.54  CALCIUM  --   < > 8.1* 8.0* 8.3*  MG 1.7  --   --   --   --   < > = values in this interval not displayed. Liver Function Tests:  Recent Labs  04/18/16 1435 05/03/16 1403 05/07/16 0440  AST 21  --  21  ALT 11*  --  59*  ALKPHOS 57  --  49  BILITOT 0.5  --  0.4  PROT 6.5 4.9* 4.8*  ALBUMIN 4.0  --   2.7*   CBC:  Recent Labs  09/26/15 0927  05/05/16 0518 05/06/16 0521 05/07/16 0440  WBC 26.4*  < > 19.3* 6.6 22.4*  NEUTROABS 5.3  --   --   --   --   HGB 13.2  < > 10.3* 10.8* 11.7*  HCT 41.7  < > 33.5* 35.6* 38.4  MCV 93.6  < > 96.5 97.3 95.0  PLT 96*  < > 168 148* 171  < > = values in this interval not displayed. Cardiac Enzymes:  Recent Labs  09/24/15 1830  TROPONINI <0.03    Assessment/Plan:   1. Unsteady gait Has worked well with PT/ OT. Will discharge home PT/OT to continue with ROM, Exercise, Gait stability and muscle strengthening.She will require  DME Rollator to allow her to maintain current level of independence with ADL's. Fall and safety precautions.   2. Chronic obstructive pulmonary disease, unspecified COPD type ([place]) Afebrile.Status post hospital admission from the 04/30/2016-05/07/2016 with acute on chronic respiratory failure from healthcare acquired pneumonia and COPD exacerbation.Has completed tapered prednisone with much improvement.Continue on Albuterol and symbicort. Continue to monitor. CBC/diff in 1-2 weeks with PCP   3. Chronic a-fib ([place]) Continue on EliQuis.   4. Depression  Stable.continue on zoloft. Monitor for mood changes.    5. Insomnia Continue temazepam.    6. Slow transit constipation Continue current regimen. Continue hydration.   7. Anxiety Continue on Alprazolam.   8. CLL   recently diagnosed to have left lung mass and  underwent biopsy for further evaluation and during the procedure became hypoxic and required hospital admission for COPD exacerbation.Continue to follow up with oncologist.     Patient is being discharged with the following home health services:    - PT/OT to continue with ROM, Exercise, Gait stability and muscle strengthening.  Patient is being discharged with the following durable medical equipment:    -  Rollator to allow her to maintain current level of independence with ADL's.   - Has own oxygen  at home.   Patient has been advised to f/u with their PCP in 1-2 weeks to for a transitions of care visit.Social services at their facility was responsible for arranging this appointment.Pt was provided with adequate prescriptions of noncontrolled medications to reach the scheduled appointment.For controlled substances, a limited supply was provided as appropriate for the individual patient.  If the pt normally receives these medications from a pain clinic or has a contract with another physician, these medications should be received from that clinic or physician only).    Future labs/tests needed:  CBC/diff, BMP in 1-2 weeks with PCP

## 2016-05-17 ENCOUNTER — Ambulatory Visit: Payer: Medicare HMO | Admitting: Student

## 2016-05-21 ENCOUNTER — Non-Acute Institutional Stay (SKILLED_NURSING_FACILITY): Payer: Medicare HMO | Admitting: Family

## 2016-05-21 ENCOUNTER — Encounter: Payer: Self-pay | Admitting: Family

## 2016-05-21 DIAGNOSIS — R21 Rash and other nonspecific skin eruption: Secondary | ICD-10-CM | POA: Diagnosis not present

## 2016-05-21 LAB — BASIC METABOLIC PANEL
BUN: 17 mg/dL (ref 4–21)
Creatinine: 0.6 mg/dL (ref 0.5–1.1)
GLUCOSE: 96 mg/dL
Potassium: 3.6 mmol/L (ref 3.4–5.3)
SODIUM: 138 mmol/L (ref 137–147)

## 2016-05-21 LAB — CBC AND DIFFERENTIAL
HEMATOCRIT: 39 % (ref 36–46)
Hemoglobin: 12.9 g/dL (ref 12.0–16.0)
PLATELETS: 44 10*3/uL — AB (ref 150–399)
WBC: 16.4 10^3/mL

## 2016-05-21 MED ORDER — PREDNISONE 10 MG PO TABS: 30.0000 mg | ORAL_TABLET | Freq: Every day | ORAL | 0 refills | Status: DC

## 2016-05-21 NOTE — Progress Notes (Signed)
Location:  [facility] Room Number: 407-P Place of Service:  SNF [phone]) Provider: Dinah Ngetich FNP-C   Alvester Chou, NP  Patient Care Team: Alvester Chou, NP as PCP - General (Nurse Practitioner)  Extended Emergency Contact Information Primary Emergency Contact: Lorella Nimrod Address: [address], [place] [address] [place] Phone: [phone] Relation: Son  Code Status: Full Code  Goals of care: Advanced Directive information Advanced Directives 04/30/2016  Does Patient Have a Medical Advance Directive? Yes  Type of Paramedic of [place];Living will  Does patient want to make changes to medical advance directive? No - Patient declined  Copy of [place] in Chart? No - copy requested  Would patient like information on creating a medical advance directive? -  Pre-existing out of facility DNR order (yellow form or pink MOST form) -     Chief Complaint  Patient presents with  . Acute Visit    Rash    HPI:  Pt is a 66 y.o. female seen today at [facility] for an acute visit for evaluation of rash. She complains of generalized rash for the past two days. Rash started from abdomen then spread to the back, arms and some to the legs. She describes rash as itchy. She reported to the facility Nurse that might have been an allergy to the fish that she eat. She has had no changes in detergent or  lotion. No recent prescribed medication. She denies any fever, chills, cough, shortness of breath or wheezing. On call provider was notified Benadryl ordered x 1 dose. Patient states had some relief with itching but medication did not stop rash from worsening. Of note patient was due for discharge home 05/22/2016 discussed with patient importance of remaining in the facility for now until stable. Patient also agrees states wont go home until  Rash is resolved. Discussed  with facility social worker need to observe patient until rash improve or resolve prior to discharge home.   Past Medical History:  Diagnosis Date  . Allergy   . Anxiety   . Asthma   . Atrial fibrillation ([place]) 04/2016  . Bronchospasm   . Cerebrovascular accident (CVA) ([place]) 08/28/2015  . Chronic lymphocytic leukemia ([place]) 2004  . COPD (chronic obstructive pulmonary disease) ([place])   . Depression   . Emphysema of lung ([place])   . Frontal sinusitis November 2012  . H/O viral encephalitis   . Headache(784.0)   . Heart murmur   . Mitral prolapse 11/24/2015  . Mitral regurgitation 11/24/2015  . Olecranon bursitis of left elbow November 2012.   in past  . Pleurisy   . Pneumonia    13 times  . Rhinitis   . Thrombocytopenia ([place])   . Tobacco abuse   . Tricuspid valve regurgitation 11/24/2015   Past Surgical History:  Procedure Laterality Date  . ABDOMINAL HYSTERECTOMY    . COLONOSCOPY    . UPPER GASTROINTESTINAL ENDOSCOPY    . VIDEO BRONCHOSCOPY Bilateral 04/18/2016   Procedure: VIDEO BRONCHOSCOPY WITHOUT FLUORO;  Surgeon: Tanda Rockers, MD;  Location: [facility] END[facility]COPY;  Service: Cardiopulmonary;  Laterality: Bilateral;    Allergies  Allergen Reactions  . Citrus     Burning in stomach   . Doxycycline     Caused pt daily headaches to be more severe   . Penicillins Hives, Nausea And Vomiting and Swelling  Patient reports tolerating ampicillin.  Has patient had a PCN reaction causing immediate rash, facial/tongue/throat swelling, SOB or lightheadedness with hypotension: Yes Has patient had a PCN reaction causing severe rash involving mucus membranes or skin necrosis: unknown Has patient had a PCN reaction that required hospitalization No Has patient had a PCN reaction occurring within the last 10 years: No If all of the above answers are "NO", then may proceed with Cephalosporin use.     Allergies as of 05/21/2016      Reactions   Citrus    Burning in stomach    Doxycycline     Caused pt daily headaches to be more severe    Penicillins Hives, Nausea And Vomiting, Swelling   Patient reports tolerating ampicillin. Has patient had a PCN reaction causing immediate rash, facial/tongue/throat swelling, SOB or lightheadedness with hypotension: Yes Has patient had a PCN reaction causing severe rash involving mucus membranes or skin necrosis: unknown Has patient had a PCN reaction that required hospitalization No Has patient had a PCN reaction occurring within the last 10 years: No If all of the above answers are "NO", then may proceed with Cephalosporin use.      Medication List       Accurate as of 05/21/16 11:09 PM. Always use your most recent med list.          albuterol (2.5 MG/3ML) 0.083% nebulizer solution Commonly known as:  PROVENTIL Take 3 mLs (2.5 mg total) by nebulization every 4 (four) hours as needed for wheezing or shortness of breath.   VENTOLIN HFA 108 (90 Base) MCG/ACT inhaler Generic drug:  albuterol Inhale 2 puffs into the lungs every 6 (six) hours as needed for shortness of breath.   ALPRAZolam 0.5 MG tablet Commonly known as:  XANAX Take 1 tablet (0.5 mg total) by mouth 2 (two) times daily as needed for anxiety or sleep.   apixaban 5 MG Tabs tablet Commonly known as:  ELIQUIS Take 1 tablet (5 mg total) by mouth 2 (two) times daily.   budesonide-formoterol 160-4.5 MCG/ACT inhaler Commonly known as:  SYMBICORT Take 2 puffs first thing in am and then another 2 puffs about 12 hours later.   cyclobenzaprine 5 MG tablet Commonly known as:  FLEXERIL Take 1 tablet (5 mg total) by mouth 3 (three) times daily.   diltiazem 180 MG 24 hr capsule Commonly known as:  CARDIZEM CD Take 1 capsule (180 mg total) by mouth daily.   feeding supplement (ENSURE ENLIVE) Liqd Take 237 mLs by mouth 2 (two) times daily between meals.   furosemide 40 MG tablet Commonly known as:  LASIX Take 1 tablet (40 mg total) by mouth every other day.    OXYGEN Inhale 2 L into the lungs continuous.   polyethylene glycol packet Commonly known as:  MIRALAX / GLYCOLAX Take 17 g by mouth daily.   predniSONE 10 MG tablet Commonly known as:  DELTASONE Take 3 tablets (30 mg total) by mouth daily with breakfast. Then 20 mg tablet by mouth X 3 days then 10 mg tablet by mouth x 3 days then D/C   sertraline 25 MG tablet Commonly known as:  ZOLOFT Take 25 mg by mouth daily.   temazepam 30 MG capsule Commonly known as:  RESTORIL Take 1 capsule (30 mg total) by mouth at bedtime.       Review of Systems  Constitutional: Negative for activity change, appetite change, chills, fatigue and fever.  HENT: Negative for congestion, rhinorrhea, sinus pain, sinus pressure  and sneezing.   Eyes: Negative.   Respiratory: Negative for cough, chest tightness, shortness of breath and wheezing.   Cardiovascular: Negative for chest pain, palpitations and leg swelling.  Gastrointestinal: Negative for abdominal distention, abdominal pain, constipation, diarrhea, nausea and vomiting.  Endocrine: Negative.   Genitourinary: Negative for dysuria, flank pain, frequency and urgency.  Musculoskeletal: Positive for gait problem.  Skin: Positive for rash. Negative for color change, pallor and wound.       Rash per HPI   Neurological: Negative for dizziness, seizures, syncope, light-headedness and headaches.  Hematological: Does not bruise/bleed easily.  Psychiatric/Behavioral: Negative for agitation, confusion, hallucinations and sleep disturbance. The patient is not nervous/anxious.     Immunization History  Administered Date(s) Administered  . Influenza Split 03/10/2011  . Influenza,inj,Quad PF,36+ Mos 12/25/2012, 01/30/2015  . PPD Test 05/07/2016  . Pneumococcal Conjugate-13 05/06/2015  . Pneumococcal Polysaccharide-23 12/25/2012   Pertinent  Health Maintenance Due  Topic Date Due  . MAMMOGRAM  10/10/1999  . DEXA SCAN  10/10/2014  . PNA vac Low Risk  Adult (2 of 2 - PPSV23) 12/25/2017  . COLONOSCOPY  07/11/2025  . INFLUENZA VACCINE  Completed    Vitals:   05/21/16 1520  BP: 106/63  Pulse: 82  Resp: 20  Temp: 98.7 F (37.1 C)  TempSrc: Oral  SpO2: 94%  Weight: 128 lb 4.8 oz (58.2 kg)  Height: '5\' 10"'$  (1.778 m)   Body mass index is 18.41 kg/m. Physical Exam  Constitutional: She is oriented to person, place, and time. She appears well-developed and well-nourished. No distress.  HENT:  Head: Normocephalic.  Mouth/Throat: Oropharynx is clear and moist. No oropharyngeal exudate.  Eyes: Conjunctivae and EOM are normal. Pupils are equal, round, and reactive to light. Right eye exhibits no discharge. Left eye exhibits no discharge. No scleral icterus.  Neck: Normal range of motion. No JVD present. No thyromegaly present.  Cardiovascular: Normal rate, regular rhythm, normal heart sounds and intact distal pulses.  Exam reveals no gallop and no friction rub.   No murmur heard. Pulmonary/Chest: Effort normal and breath sounds normal. No respiratory distress. She has no wheezes. She has no rales.  Oxygen 2 liters via [place] in place.   Abdominal: Soft. Bowel sounds are normal. She exhibits no distension. There is no tenderness. There is no rebound and no guarding.  Musculoskeletal: Normal range of motion. She exhibits no edema, tenderness or deformity.  Lymphadenopathy:    She has no cervical adenopathy.  Neurological: She is oriented to person, place, and time.  Skin: Skin is warm and dry. No erythema. No pallor.  Generalized beefy red rash on trunk, arms, neck and few on legs.   Psychiatric: She has a normal mood and affect.    Labs reviewed:  Recent Labs  04/30/16 2138  05/05/16 0518 05/06/16 0521 05/07/16 0440 05/11/16 05/15/16  NA  --   < > 136 140 137 142 143  K  --   < > 5.1 3.8 4.3 4.2 3.7  CL  --   < > 95* 94* 95*  --   --   CO2  --   < > 36* 39* 37*  --   --   GLUCOSE  --   < > 104* 79 82  --   --   BUN  --   < > 34*  22* 21* 20 19  CREATININE  --   < > 0.54 0.36* 0.54 0.5 0.6  CALCIUM  --   < >  8.1* 8.0* 8.3*  --   --   MG 1.7  --   --   --   --   --   --   < > = values in this interval not displayed.  Recent Labs  04/18/16 1435 05/03/16 1403 05/07/16 0440  AST 21  --  21  ALT 11*  --  59*  ALKPHOS 57  --  49  BILITOT 0.5  --  0.4  PROT 6.5 4.9* 4.8*  ALBUMIN 4.0  --  2.7*    Recent Labs  09/26/15 0927  05/05/16 0518 05/06/16 0521 05/07/16 0440 05/11/16 05/15/16  WBC 26.4*  < > 19.3* 6.6 22.4* 33.9 22.1  NEUTROABS 5.3  --   --   --   --   --   --   HGB 13.2  < > 10.3* 10.8* 11.7* 13.1 12.4  HCT 41.7  < > 33.5* 35.6* 38.4 42 40  MCV 93.6  < > 96.5 97.3 95.0  --   --   PLT 96*  < > 168 148* 171 154 95*  < > = values in this interval not displayed. Lab Results  Component Value Date   TSH 0.310 (L) 04/30/2016   No results found for: HGBA1C Lab Results  Component Value Date   CHOL 107 05/03/2016    Assessment/Plan   Rash and nonspecific skin eruption Afebrile.Unclear etiology though patient reported to nurse possible reaction from eating fish. No new medication or changes in detergent or lotion. Red itchy rash on trunk spreading to lower and upper extremities and neck areas. On call provider ordered benadryl. Will start on [place] Prednisone 10 mg tablet take 30 mg Tablet X 3 days then 20 mg tablet x 3 days then 10 mg tablet x 3 days then d/c.Obtain stat CBC/diff, BMP.  Monitor vital signs every shift x 1 week then change to previous orders. Will send to ER if symptoms worsening or develops any shortness of breath. Hold on discharge process until patient is stable for discharge home.     Family/ staff Communication: Reviewed plan of care with patient, facility social worker and Nurse supervisor.   Labs/tests ordered:  .Obtain stat CBC/diff, BMP.

## 2016-05-23 ENCOUNTER — Emergency Department (HOSPITAL_COMMUNITY)
Admission: EM | Admit: 2016-05-23 | Discharge: 2016-05-23 | Disposition: A | Discharge status: Home / Self Care | Payer: Medicare HMO | Attending: Emergency Medicine | Admitting: Emergency Medicine

## 2016-05-23 ENCOUNTER — Non-Acute Institutional Stay (SKILLED_NURSING_FACILITY): Payer: Medicare HMO | Admitting: Family

## 2016-05-23 ENCOUNTER — Encounter (HOSPITAL_COMMUNITY): Payer: Self-pay | Admitting: Emergency Medicine

## 2016-05-23 ENCOUNTER — Inpatient Hospital Stay: Payer: Medicare HMO | Admitting: Adult Health

## 2016-05-23 DIAGNOSIS — T7840XA Allergy, unspecified, initial encounter: Secondary | ICD-10-CM

## 2016-05-23 DIAGNOSIS — R21 Rash and other nonspecific skin eruption: Secondary | ICD-10-CM

## 2016-05-23 DIAGNOSIS — R040 Epistaxis: Secondary | ICD-10-CM

## 2016-05-23 DIAGNOSIS — Z79899 Other long term (current) drug therapy: Secondary | ICD-10-CM | POA: Insufficient documentation

## 2016-05-23 DIAGNOSIS — J449 Chronic obstructive pulmonary disease, unspecified: Secondary | ICD-10-CM | POA: Insufficient documentation

## 2016-05-23 DIAGNOSIS — L509 Urticaria, unspecified: Secondary | ICD-10-CM | POA: Diagnosis present

## 2016-05-23 DIAGNOSIS — Z8673 Personal history of transient ischemic attack (TIA), and cerebral infarction without residual deficits: Secondary | ICD-10-CM | POA: Insufficient documentation

## 2016-05-23 DIAGNOSIS — Z7901 Long term (current) use of anticoagulants: Secondary | ICD-10-CM | POA: Diagnosis not present

## 2016-05-23 DIAGNOSIS — D696 Thrombocytopenia, unspecified: Secondary | ICD-10-CM | POA: Diagnosis not present

## 2016-05-23 DIAGNOSIS — F17210 Nicotine dependence, cigarettes, uncomplicated: Secondary | ICD-10-CM | POA: Diagnosis not present

## 2016-05-23 LAB — PROTIME-INR
INR: 1.28
Prothrombin Time: 16.1 seconds — ABNORMAL HIGH (ref 11.4–15.2)

## 2016-05-23 LAB — CBC WITH DIFFERENTIAL/PLATELET
Basophils Absolute: 0 10*3/uL (ref 0.0–0.1)
Basophils Relative: 0 %
EOS PCT: 2 %
Eosinophils Absolute: 0.3 10*3/uL (ref 0.0–0.7)
HEMATOCRIT: 36.8 % (ref 36.0–46.0)
HEMOGLOBIN: 12 g/dL (ref 12.0–15.0)
LYMPHS PCT: 49 %
Lymphs Abs: 7.3 10*3/uL — ABNORMAL HIGH (ref 0.7–4.0)
MCH: 30.2 pg (ref 26.0–34.0)
MCHC: 32.6 g/dL (ref 30.0–36.0)
MCV: 92.5 fL (ref 78.0–100.0)
MONO ABS: 0.2 10*3/uL (ref 0.1–1.0)
MONOS PCT: 1 %
Neutro Abs: 7.2 10*3/uL (ref 1.7–7.7)
Neutrophils Relative %: 48 %
Platelets: 60 10*3/uL — ABNORMAL LOW (ref 150–400)
RBC: 3.98 MIL/uL (ref 3.87–5.11)
RDW: 15.2 % (ref 11.5–15.5)
WBC: 15 10*3/uL — AB (ref 4.0–10.5)

## 2016-05-23 LAB — COMPREHENSIVE METABOLIC PANEL
ALBUMIN: 3.7 g/dL (ref 3.5–5.0)
ALT: 18 U/L (ref 14–54)
AST: 20 U/L (ref 15–41)
Alkaline Phosphatase: 60 U/L (ref 38–126)
Anion gap: 9 (ref 5–15)
BILIRUBIN TOTAL: 0.2 mg/dL — AB (ref 0.3–1.2)
BUN: 18 mg/dL (ref 6–20)
CO2: 27 mmol/L (ref 22–32)
Calcium: 8.6 mg/dL — ABNORMAL LOW (ref 8.9–10.3)
Chloride: 100 mmol/L — ABNORMAL LOW (ref 101–111)
Creatinine, Ser: 0.7 mg/dL (ref 0.44–1.00)
GFR calc Af Amer: >60 mL/min (ref >60)
GFR calc non Af Amer: >60 mL/min (ref >60)
GLUCOSE: 122 mg/dL — AB (ref 65–99)
POTASSIUM: 3.5 mmol/L (ref 3.5–5.1)
Sodium: 136 mmol/L (ref 135–145)
TOTAL PROTEIN: 5.9 g/dL — AB (ref 6.5–8.1)

## 2016-05-23 MED ORDER — PREDNISONE 10 MG PO TABS: ORAL_TABLET | ORAL | 0 refills | Status: DC

## 2016-05-23 MED ORDER — DIPHENHYDRAMINE HCL 25 MG PO CAPS: 25.0000 mg | ORAL_CAPSULE | ORAL | 0 refills | Status: DC | PRN

## 2016-05-23 NOTE — ED Provider Notes (Signed)
[facility] DEPT Provider Note   CSN: [id number] Arrival date & time: 05/23/16  1129     History   Chief Complaint Chief Complaint  Patient presents with  . Urticaria    HPI Sheri Ayala is a 66 y.o. female.  The history is provided by the patient. No language interpreter was used.  Urticaria  This is a new problem. The current episode started 2 days ago. The problem has been gradually worsening. Pertinent negatives include no shortness of breath. Nothing aggravates the symptoms. Nothing relieves the symptoms. She has tried nothing for the symptoms. The treatment provided no relief.  Rash     Pt complains of a rash full body.  Pt reports she has a history of chronic leukemia and she is on blood thinners.  Pt was recently hospitalized with bilat pneumonia.  Pt had a thoracentesis.  Pt reports she is on blood thinners but can not name the drug she is on. Past Medical History:  Diagnosis Date  . Allergy   . Anxiety   . Asthma   . Atrial fibrillation ([place]) 04/2016  . Bronchospasm   . Cerebrovascular accident (CVA) ([place]) 08/28/2015  . Chronic lymphocytic leukemia ([place]) 2004  . COPD (chronic obstructive pulmonary disease) ([place])   . Depression   . Emphysema of lung ([place])   . Frontal sinusitis November 2012  . H/O viral encephalitis   . Headache(784.0)   . Heart murmur   . Mitral prolapse 11/24/2015  . Mitral regurgitation 11/24/2015  . Olecranon bursitis of left elbow November 2012.   in past  . Pleurisy   . Pneumonia    13 times  . Rhinitis   . Thrombocytopenia ([place])   . Tobacco abuse   . Tricuspid valve regurgitation 11/24/2015    Patient Active Problem List   Diagnosis Date Noted  . Pleural effusion   . S/P thoracentesis   . Pleural effusion on right   . Abnormal TSH 05/01/2016  . History of CVA (cerebrovascular accident) 05/01/2016  . HCAP (healthcare-associated pneumonia) 04/30/2016  . Chronic a-fib ([place]) 04/30/2016  . Severe protein-calorie malnutrition  ([place])   . Acute on chronic respiratory failure with hypoxemia ([place]) 04/18/2016  . COPD GOLD III  04/10/2016  . Mass of left lung 04/10/2016  . Right lower lobe pneumonia ([place]) 01/30/2015  . Generalized weakness 01/30/2015  . COPD (chronic obstructive pulmonary disease) ([place]) 01/29/2015  . SOB (shortness of breath) 12/24/2012  . Dizziness 12/22/2012  . Anemia 03/28/2011  . Headache in front of head 03/27/2011  . Thrombocytopenia ([place]) 03/27/2011  . Cellulitis of elbow 03/09/2011  . CLL (chronic lymphocytic leukemia) ([place]) 03/09/2011  . Cigarette smoker 03/09/2011    Past Surgical History:  Procedure Laterality Date  . ABDOMINAL HYSTERECTOMY    . COLONOSCOPY    . UPPER GASTROINTESTINAL ENDOSCOPY    . VIDEO BRONCHOSCOPY Bilateral 04/18/2016   Procedure: VIDEO BRONCHOSCOPY WITHOUT FLUORO;  Surgeon: Tanda Rockers, MD;  Location: [facility];  Service: Cardiopulmonary;  Laterality: Bilateral;    OB History    Gravida Para Term Preterm AB Living   '2 2 2     2   '$ SAB TAB Ectopic Multiple Live Births                   Home Medications    Prior to Admission medications   Medication Sig Start Date End Date Taking? Authorizing Provider  albuterol (PROVENTIL) (2.5 MG/3ML) 0.083% nebulizer solution Take 3 mLs (2.5 mg total)  by nebulization every 4 (four) hours as needed for wheezing or shortness of breath. 06/21/13   Francine Graven, DO  ALPRAZolam Duanne Moron) 0.5 MG tablet Take 1 tablet (0.5 mg total) by mouth 2 (two) times daily as needed for anxiety or sleep. 05/05/16   Orson Eva, MD  apixaban (ELIQUIS) 5 MG TABS tablet Take 1 tablet (5 mg total) by mouth 2 (two) times daily. 05/05/16   Orson Eva, MD  budesonide-formoterol [facility]) 160-4.5 MCG/ACT inhaler Take 2 puffs first thing in am and then another 2 puffs about 12 hours later. 04/29/16   Tanda Rockers, MD  cyclobenzaprine (FLEXERIL) 5 MG tablet Take 1 tablet (5 mg total) by mouth 3 (three) times daily. 05/05/16   Orson Eva, MD    diltiazem (CARDIZEM CD) 180 MG 24 hr capsule Take 1 capsule (180 mg total) by mouth daily. 05/08/16   Orson Eva, MD  feeding supplement, ENSURE ENLIVE, (ENSURE ENLIVE) LIQD Take 237 mLs by mouth 2 (two) times daily between meals. 05/06/16   Orson Eva, MD  furosemide (LASIX) 40 MG tablet Take 1 tablet (40 mg total) by mouth every other day. 05/08/16   Orson Eva, MD  OXYGEN Inhale 2 L into the lungs continuous.    Historical Provider, MD  polyethylene glycol (MIRALAX / GLYCOLAX) packet Take 17 g by mouth daily.    Historical Provider, MD  predniSONE (DELTASONE) 10 MG tablet Take 3 tablets (30 mg total) by mouth daily with breakfast. Then 20 mg tablet by mouth X 3 days then 10 mg tablet by mouth x 3 days then D/C 05/21/16 05/24/16  Dinah C Ngetich, NP  sertraline (ZOLOFT) 25 MG tablet Take 25 mg by mouth daily. 04/05/16   Historical Provider, MD  temazepam (RESTORIL) 30 MG capsule Take 1 capsule (30 mg total) by mouth at bedtime. 05/05/16   Orson Eva, MD  VENTOLIN HFA 108 (90 Base) MCG/ACT inhaler Inhale 2 puffs into the lungs every 6 (six) hours as needed for shortness of breath. 04/06/16   Historical Provider, MD    Family History Family History  Problem Relation Age of Onset  . Aneurysm Mother   . Liver disease Father   . Congestive Heart Failure Brother   . Heart disease Brother   . Colon cancer Neg Hx   . Esophageal cancer Neg Hx   . Pancreatic cancer Neg Hx   . Rectal cancer Neg Hx   . Stomach cancer Neg Hx     Social History Social History  Substance Use Topics  . Smoking status: Current Every Day Smoker    Packs/day: 0.50    Years: 45.00    Types: Cigarettes, E-cigarettes  . Smokeless tobacco: Never Used  . Alcohol use No     Allergies   Citrus; Doxycycline; and Penicillins   Review of Systems Review of Systems  Respiratory: Negative for shortness of breath.   Skin: Positive for rash.  All other systems reviewed and are negative.    Physical Exam Updated Vital  Signs BP 119/59   Pulse 82   Temp 98 F (36.7 C) (Oral)   Resp 18   SpO2 96%   Physical Exam  Constitutional: She appears well-developed and well-nourished. No distress.  HENT:  Head: Normocephalic and atraumatic.  Eyes: Conjunctivae are normal.  Neck: Neck supple.  Cardiovascular: Normal rate and regular rhythm.   No murmur heard. Pulmonary/Chest: Effort normal and breath sounds normal. No respiratory distress.  Abdominal: Soft. There is no tenderness.  Musculoskeletal: She  exhibits no edema.  Neurological: She is alert.  Skin: Skin is warm. Rash noted. There is erythema.  Psychiatric: She has a normal mood and affect.  Nursing note and vitals reviewed.    ED Treatments / Results  Labs (all labs ordered are listed, but only abnormal results are displayed) Labs Reviewed  PROTIME-INR - Abnormal; Notable for the following:       Result Value   Prothrombin Time 16.1 (*)    All other components within normal limits  CBC WITH DIFFERENTIAL/PLATELET  COMPREHENSIVE METABOLIC PANEL    EKG  EKG Interpretation None       Radiology No results found.  Procedures Procedures (including critical care time)  Medications Ordered in ED Medications - No data to display   Initial Impression / Assessment and Plan / ED Course  I have reviewed the triage vital signs and the nursing notes.  Pertinent labs & imaging results that were available during my care of the patient were reviewed by me and considered in my medical decision making (see chart for details).     I will start pt on prednisone taper.  Benadryl '25mg'$  every 4 hours. Stop eliquis.   Final Clinical Impressions(s) / ED Diagnoses   Final diagnoses:  Allergic reaction, initial encounter    New Prescriptions New Prescriptions   DIPHENHYDRAMINE (BENADRYL) 25 MG CAPSULE    Take 1 capsule (25 mg total) by mouth every 4 (four) hours as needed (rash).   PREDNISONE (DELTASONE) 10 MG TABLET    6,5,4,3,2,1 taper      Fransico Meadow, PA-C 05/23/16 1728    Carmin Muskrat, MD 05/24/16 [phone]

## 2016-05-23 NOTE — ED Notes (Signed)
Patient states hives have been present for 1 week. Denies shortness of breath at this time.

## 2016-05-23 NOTE — Discharge Instructions (Signed)
Return if any problems. Stop eliquis.  Discuss other options with your provider.

## 2016-05-23 NOTE — Progress Notes (Signed)
Location:  [facility] Room Number: [place] of Service:  SNF [id number]) Provider: Marlowe Sax FNP-C   Patient Care Team: Alvester Chou, NP as PCP - General (Nurse Practitioner)  Extended Emergency Contact Information Primary Emergency Contact: Lorella Nimrod Address: [address], [place] [address] [place] Phone: [phone] Relation: Son  Code Status: Full Code  Goals of Care: Advanced Directive information Advanced Directives 04/30/2016  Does Patient Have a Medical Advance Directive? Yes  Type of Paramedic of [place];Living will  Does patient want to make changes to medical advance directive? No - Patient declined  Copy of [place] in Chart? No - copy requested  Would patient like information on creating a medical advance directive? -  Pre-existing out of facility DNR order (yellow form or pink MOST form) -     Chief Complaint  Patient presents with  . Acute Visit    worsening rash, Nose bleed     HPI: Patient is a 66 y.o. female seen in today for an acute visit for evaluation of worsening rash and nose bleed. She has a medical history of COPD, Asthma, AFib, CVA, Chronic lymphocytic leukemia, anxiety, depression among other conditions. She is seen in her room today. She states has had a nose bleed twice described as " like a faucet has been turned on". Also states her generalized rash seems to be getting worse spreading to the legs.Rash started on 05/19/2016 benadryl ordered. Tapered prednisone was ordered 05/22/2015. Patient had first dose on 05/22/2016. She has not been able to sleep at night due to burning sensation on the chest.she has had benadryl without any relief.She reported to the facility Nurse that might have been an allergy to the fish that she eat. She tells me that she read about her blood thinner that it can cause rash. She thinks might be getting too  much of the blood thinner. She has had no changes in detergent or lotion. No recent prescribed medication. She denies any fever, chills, cough, shortness of breath or wheezing.Recent PLTs trending down previous was 95 now down to 44.   Health Maintenance  Topic Date Due  . Hepatitis C Screening  1951-03-02  . TETANUS/TDAP  10/09/1968  . MAMMOGRAM  10/10/1999  . ZOSTAVAX  10/09/2009  . DEXA SCAN  10/10/2014  . PNA vac Low Risk Adult (2 of 2 - PPSV23) 12/25/2017  . COLONOSCOPY  07/11/2025  . INFLUENZA VACCINE  Completed    Past Medical History:  Diagnosis Date  . Allergy   . Anxiety   . Asthma   . Atrial fibrillation ([place]) 04/2016  . Bronchospasm   . Cerebrovascular accident (CVA) ([place]) 08/28/2015  . Chronic lymphocytic leukemia ([place]) 2004  . COPD (chronic obstructive pulmonary disease) ([place])   . Depression   . Emphysema of lung ([place])   . Frontal sinusitis November 2012  . H/O viral encephalitis   . Headache(784.0)   . Heart murmur   . Mitral prolapse 11/24/2015  . Mitral regurgitation 11/24/2015  . Olecranon bursitis of left elbow November 2012.   in past  . Pleurisy   . Pneumonia    13 times  . Rhinitis   . Thrombocytopenia ([place])   . Tobacco abuse   . Tricuspid valve regurgitation 11/24/2015    Past Surgical History:  Procedure Laterality Date  . ABDOMINAL HYSTERECTOMY    .  COLONOSCOPY    . UPPER GASTROINTESTINAL ENDOSCOPY    . VIDEO BRONCHOSCOPY Bilateral 04/18/2016   Procedure: VIDEO BRONCHOSCOPY WITHOUT FLUORO;  Surgeon: Tanda Rockers, MD;  Location: [facility];  Service: Cardiopulmonary;  Laterality: Bilateral;    Family History  Problem Relation Age of Onset  . Aneurysm Mother   . Liver disease Father   . Congestive Heart Failure Brother   . Heart disease Brother   . Colon cancer Neg Hx   . Esophageal cancer Neg Hx   . Pancreatic cancer Neg Hx   . Rectal cancer Neg Hx   . Stomach cancer Neg Hx     Social History   Social History  . Marital  status: Single    Spouse name: N/A  . Number of children: N/A  . Years of education: N/A   Social History Main Topics  . Smoking status: Current Every Day Smoker    Packs/day: 0.50    Years: 45.00    Types: Cigarettes, E-cigarettes  . Smokeless tobacco: Never Used  . Alcohol use No  . Drug use: No  . Sexual activity: Not Currently   Other Topics Concern  . Not on file   Social History Narrative  . No narrative on file    reports that she has been smoking Cigarettes and E-cigarettes.  She has a 22.50 pack-year smoking history. She has never used smokeless tobacco. She reports that she does not drink alcohol or use drugs.   Allergies  Allergen Reactions  . Citrus     Burning in stomach   . Doxycycline     Caused pt daily headaches to be more severe   . Penicillins Hives, Nausea And Vomiting and Swelling    Patient reports tolerating ampicillin.  Has patient had a PCN reaction causing immediate rash, facial/tongue/throat swelling, SOB or lightheadedness with hypotension: Yes Has patient had a PCN reaction causing severe rash involving mucus membranes or skin necrosis: unknown Has patient had a PCN reaction that required hospitalization No Has patient had a PCN reaction occurring within the last 10 years: No If all of the above answers are "NO", then may proceed with Cephalosporin use.     Allergies as of 05/23/2016      Reactions   Citrus    Burning in stomach    Doxycycline    Caused pt daily headaches to be more severe    Penicillins Hives, Nausea And Vomiting, Swelling   Patient reports tolerating ampicillin. Has patient had a PCN reaction causing immediate rash, facial/tongue/throat swelling, SOB or lightheadedness with hypotension: Yes Has patient had a PCN reaction causing severe rash involving mucus membranes or skin necrosis: unknown Has patient had a PCN reaction that required hospitalization No Has patient had a PCN reaction occurring within the last 10 years:  No If all of the above answers are "NO", then may proceed with Cephalosporin use.      Medication List       Accurate as of 05/23/16 10:48 AM. Always use your most recent med list.          albuterol (2.5 MG/3ML) 0.083% nebulizer solution Commonly known as:  PROVENTIL Take 3 mLs (2.5 mg total) by nebulization every 4 (four) hours as needed for wheezing or shortness of breath.   VENTOLIN HFA 108 (90 Base) MCG/ACT inhaler Generic drug:  albuterol Inhale 2 puffs into the lungs every 6 (six) hours as needed for shortness of breath.   ALPRAZolam 0.5 MG tablet Commonly known as:  XANAX Take 1 tablet (0.5 mg total) by mouth 2 (two) times daily as needed for anxiety or sleep.   apixaban 5 MG Tabs tablet Commonly known as:  ELIQUIS Take 1 tablet (5 mg total) by mouth 2 (two) times daily.   budesonide-formoterol 160-4.5 MCG/ACT inhaler Commonly known as:  SYMBICORT Take 2 puffs first thing in am and then another 2 puffs about 12 hours later.   cyclobenzaprine 5 MG tablet Commonly known as:  FLEXERIL Take 1 tablet (5 mg total) by mouth 3 (three) times daily.   diltiazem 180 MG 24 hr capsule Commonly known as:  CARDIZEM CD Take 1 capsule (180 mg total) by mouth daily.   feeding supplement (ENSURE ENLIVE) Liqd Take 237 mLs by mouth 2 (two) times daily between meals.   furosemide 40 MG tablet Commonly known as:  LASIX Take 1 tablet (40 mg total) by mouth every other day.   OXYGEN Inhale 2 L into the lungs continuous.   polyethylene glycol packet Commonly known as:  MIRALAX / GLYCOLAX Take 17 g by mouth daily.   predniSONE 10 MG tablet Commonly known as:  DELTASONE Take 3 tablets (30 mg total) by mouth daily with breakfast. Then 20 mg tablet by mouth X 3 days then 10 mg tablet by mouth x 3 days then D/C   sertraline 25 MG tablet Commonly known as:  ZOLOFT Take 25 mg by mouth daily.   temazepam 30 MG capsule Commonly known as:  RESTORIL Take 1 capsule (30 mg total) by  mouth at bedtime.        Review of Systems:  Review of Systems  Constitutional: Negative for activity change, appetite change, chills, fatigue and fever.  HENT: Negative for congestion, rhinorrhea, sinus pain, sinus pressure and sneezing.   Eyes: Negative.   Respiratory: Negative for cough, chest tightness, shortness of breath and wheezing.   Cardiovascular: Negative for chest pain, palpitations and leg swelling.  Gastrointestinal: Negative for abdominal distention, abdominal pain, constipation, diarrhea, nausea and vomiting.  Endocrine: Negative.   Genitourinary: Negative for dysuria, flank pain, frequency and urgency.  Musculoskeletal: Positive for gait problem.  Skin: Positive for rash. Negative for color change, pallor and wound.       Rash per HPI   Neurological: Negative for dizziness, seizures, syncope, light-headedness and headaches.  Hematological: Does not bruise/bleed easily.  Psychiatric/Behavioral: Negative for agitation, confusion, hallucinations and sleep disturbance. The patient is nervous/anxious.     Physical Exam: Vitals:   05/23/16 1030  BP: 122/70  Pulse: 82  Resp: 18  Temp: 98.2 F (36.8 C)  SpO2: 95%  Weight: 127 lb (57.6 kg)  Height: '5\' 10"'$  (1.778 m)   Body mass index is 18.22 kg/m. Physical Exam  Constitutional: She is oriented to person, place, and time. She appears well-developed and well-nourished. No distress.  HENT:  Head: Normocephalic.  Mouth/Throat: Oropharynx is clear and moist. No oropharyngeal exudate.  Eyes: Conjunctivae and EOM are normal. Pupils are equal, round, and reactive to light. Right eye exhibits no discharge. Left eye exhibits no discharge. No scleral icterus.  Neck: Normal range of motion. No JVD present. No thyromegaly present.  Cardiovascular: Normal rate, regular rhythm, normal heart sounds and intact distal pulses.  Exam reveals no gallop and no friction rub.   No murmur heard. Pulmonary/Chest: Effort normal and  breath sounds normal. No respiratory distress. She has no wheezes. She has no rales.  Oxygen 2 liters via [place] in place.   Abdominal: Soft. Bowel sounds are  normal. She exhibits no distension. There is no tenderness. There is no rebound and no guarding.  Musculoskeletal: Normal range of motion. She exhibits no edema, tenderness or deformity.  Lymphadenopathy:    She has no cervical adenopathy.  Neurological: She is oriented to person, place, and time.  Skin: Skin is warm and dry. No erythema. No pallor.  Generalized red rash on trunk, arms, neck and legs. Redness also on the face noted.   Psychiatric: She has a normal mood and affect.    Labs reviewed: Basic Metabolic Panel:  Recent Labs  04/30/16 2138  05/05/16 0518 05/06/16 0521 05/07/16 0440 05/11/16 05/15/16 05/21/16  NA  --   < > 136 140 137 142 143 138  K  --   < > 5.1 3.8 4.3 4.2 3.7 3.6  CL  --   < > 95* 94* 95*  --   --   --   CO2  --   < > 36* 39* 37*  --   --   --   GLUCOSE  --   < > 104* 79 82  --   --   --   BUN  --   < > 34* 22* 21* '20 19 17  '$ CREATININE  --   < > 0.54 0.36* 0.54 0.5 0.6 0.6  CALCIUM  --   < > 8.1* 8.0* 8.3*  --   --   --   MG 1.7  --   --   --   --   --   --   --   TSH 0.310*  --   --   --   --   --   --   --   < > = values in this interval not displayed. Liver Function Tests:  Recent Labs  04/18/16 1435 05/03/16 1403 05/07/16 0440  AST 21  --  21  ALT 11*  --  59*  ALKPHOS 57  --  49  BILITOT 0.5  --  0.4  PROT 6.5 4.9* 4.8*  ALBUMIN 4.0  --  2.7*   CBC:  Recent Labs  09/26/15 0927  05/05/16 0518 05/06/16 0521 05/07/16 0440 05/11/16 05/15/16 05/21/16  WBC 26.4*  < > 19.3* 6.6 22.4* 33.9 22.1 16.4  NEUTROABS 5.3  --   --   --   --   --   --   --   HGB 13.2  < > 10.3* 10.8* 11.7* 13.1 12.4 12.9  HCT 41.7  < > 33.5* 35.6* 38.4 42 40 39  MCV 93.6  < > 96.5 97.3 95.0  --   --   --   PLT 96*  < > 168 148* 171 154 95* 44*  < > = values in this interval not displayed. Lipid  Panel:  Recent Labs  05/03/16 1403  CHOL 107    Assessment/Plan 1. Thrombocytopenia (HCC) Recent Plts down to 44 previous 95. She has had two episodes of nose bleed. Will send to ER for evaluation.   2. Rash and nonspecific skin eruption Afebrile.unknow etiology. Itchy Rash spreading from trunk area to arms and legs. Has had Benadryl without any relief. Tapered Prednisone started 05/22/2016. Will send to ER for evaluation.   3. Epistaxis Has had two episodes described as " Turning a faucet on".Suspect due to low platelets. Will send to ER for evaluation.    Reviewed plan of Care: Reviewed plan of care with patient and facility Nurse and Supervisor.   Labs/Test: None. Send  to ER for evaluation

## 2016-05-23 NOTE — ED Triage Notes (Signed)
Per EMS, pt from [facility].  Pt has hives all over body.  X 1 week.  Using benadryl and meds in rehab with no changes.  Pt was placed on eliquis x 2 weeks ago.  No other med change.  Pt on baseline oxygen.  Pt denies increased SOB or throat changes.  Vitals:  126/64, hr 96, resp 18, 94% 2l per [place]

## 2016-05-24 ENCOUNTER — Other Ambulatory Visit: Payer: Self-pay | Admitting: *Deleted

## 2016-05-25 ENCOUNTER — Telehealth: Payer: Self-pay | Admitting: Oncology

## 2016-05-25 ENCOUNTER — Other Ambulatory Visit: Payer: Self-pay | Admitting: *Deleted

## 2016-05-25 ENCOUNTER — Encounter (HOSPITAL_COMMUNITY): Payer: Medicare HMO

## 2016-05-25 DIAGNOSIS — R918 Other nonspecific abnormal finding of lung field: Secondary | ICD-10-CM

## 2016-05-25 DIAGNOSIS — C9110 Chronic lymphocytic leukemia of B-cell type not having achieved remission: Secondary | ICD-10-CM

## 2016-05-25 LAB — PATHOLOGIST SMEAR REVIEW

## 2016-05-25 NOTE — Telephone Encounter (Signed)
Returned call to pt to confirm r/s appt date/time 2/19 at 1015 am per staff msg. Pt PET scan is 2/16

## 2016-05-25 NOTE — Telephone Encounter (Signed)
Patient has a PET Scan on 2/16 and needs to change appointment till after the scan to get results

## 2016-05-28 ENCOUNTER — Ambulatory Visit: Payer: Medicare HMO | Admitting: Oncology

## 2016-05-28 ENCOUNTER — Other Ambulatory Visit: Payer: Medicare HMO

## 2016-05-28 NOTE — Progress Notes (Deleted)
Cardiology Office Note    Date:  05/28/2016   ID:  Sheri Ayala, DOB [birth year], MRN [id number]  PCP:  Alvester Chou, NP  Cardiologist: Dr. Stanford Breed  No chief complaint on file.   History of Present Illness:    Sheri Ayala is a 66 y.o. female with past medical history of COPD, CLL, Left Lung Mass, prior CVA, and tobacco use who presents to the office today for hospital follow-up.   She was recently admitted from 1/15 - 05/07/2016 for acute on chronic respiratory failure secondary to HCAP. Cardiology consulted secondary to atrial fibrillation with RVR. Initially started on IV Diltiazem but became hypotensive with this and was switched to PO. Was started on Eliquis '5mg'$  BID prior to discharge.     Past Medical History:  Diagnosis Date  . Allergy   . Anxiety   . Asthma   . Atrial fibrillation ([place]) 04/2016  . Bronchospasm   . Cerebrovascular accident (CVA) ([place]) 08/28/2015  . Chronic lymphocytic leukemia ([place]) 2004  . COPD (chronic obstructive pulmonary disease) ([place])   . Depression   . Emphysema of lung ([place])   . Frontal sinusitis November 2012  . H/O viral encephalitis   . Headache(784.0)   . Heart murmur   . Mitral prolapse 11/24/2015  . Mitral regurgitation 11/24/2015  . Olecranon bursitis of left elbow November 2012.   in past  . Pleurisy   . Pneumonia    13 times  . Rhinitis   . Thrombocytopenia ([place])   . Tobacco abuse   . Tricuspid valve regurgitation 11/24/2015    Past Surgical History:  Procedure Laterality Date  . ABDOMINAL HYSTERECTOMY    . COLONOSCOPY    . UPPER GASTROINTESTINAL ENDOSCOPY    . VIDEO BRONCHOSCOPY Bilateral 04/18/2016   Procedure: VIDEO BRONCHOSCOPY WITHOUT FLUORO;  Surgeon: Tanda Rockers, MD;  Location: [facility];  Service: Cardiopulmonary;  Laterality: Bilateral;    Current Medications: Outpatient Medications Prior to Visit  Medication Sig Dispense Refill  . albuterol (PROVENTIL) (2.5 MG/3ML) 0.083% nebulizer solution Take 3 mLs  (2.5 mg total) by nebulization every 4 (four) hours as needed for wheezing or shortness of breath. 75 mL 0  . ALPRAZolam (XANAX) 0.5 MG tablet Take 1 tablet (0.5 mg total) by mouth 2 (two) times daily as needed for anxiety or sleep. 6 tablet 0  . apixaban (ELIQUIS) 5 MG TABS tablet Take 1 tablet (5 mg total) by mouth 2 (two) times daily. 60 tablet 0  . budesonide-formoterol (SYMBICORT) 160-4.5 MCG/ACT inhaler Take 2 puffs first thing in am and then another 2 puffs about 12 hours later. 1 Inhaler 11  . cyclobenzaprine (FLEXERIL) 5 MG tablet Take 1 tablet (5 mg total) by mouth 3 (three) times daily. 21 tablet 0  . diltiazem (CARDIZEM CD) 180 MG 24 hr capsule Take 1 capsule (180 mg total) by mouth daily. 30 capsule 0  . diphenhydrAMINE (BENADRYL) 25 mg capsule Take 1 capsule (25 mg total) by mouth every 4 (four) hours as needed (rash). 30 capsule 0  . feeding supplement, ENSURE ENLIVE, (ENSURE ENLIVE) LIQD Take 237 mLs by mouth 2 (two) times daily between meals. 60 Bottle 0  . furosemide (LASIX) 40 MG tablet Take 1 tablet (40 mg total) by mouth every other day. 30 tablet 0  . OXYGEN Inhale 2 L into the lungs continuous.    . polyethylene glycol (MIRALAX / GLYCOLAX) packet Take 17 g by mouth daily.    . predniSONE (DELTASONE) 10 MG  tablet 6,5,4,3,2,1 taper 21 tablet 0  . sertraline (ZOLOFT) 25 MG tablet Take 25 mg by mouth daily.    . temazepam (RESTORIL) 30 MG capsule Take 1 capsule (30 mg total) by mouth at bedtime. 5 capsule 0   No facility-administered medications prior to visit.      Allergies:   Citrus; Doxycycline; and Penicillins   Social History   Social History  . Marital status: Single    Spouse name: N/A  . Number of children: N/A  . Years of education: N/A   Social History Main Topics  . Smoking status: Current Every Day Smoker    Packs/day: 0.50    Years: 45.00    Types: Cigarettes, E-cigarettes  . Smokeless tobacco: Never Used  . Alcohol use No  . Drug use: No  . Sexual  activity: Not Currently   Other Topics Concern  . Not on file   Social History Narrative  . No narrative on file     Family History:  The patient's ***family history includes Aneurysm in her mother; Congestive Heart Failure in her brother; Heart disease in her brother; Liver disease in her father.   Review of Systems:   Please see the history of present illness.     General:  No chills, fever, night sweats or weight changes.  Cardiovascular:  No chest pain, dyspnea on exertion, edema, orthopnea, palpitations, paroxysmal nocturnal dyspnea. Dermatological: No rash, lesions/masses Respiratory: No cough, dyspnea Urologic: No hematuria, dysuria Abdominal:   No nausea, vomiting, diarrhea, bright red blood per rectum, melena, or hematemesis Neurologic:  No visual changes, wkns, changes in mental status. All other systems reviewed and are otherwise negative except as noted above.   Physical Exam:    VS:  There were no vitals taken for this visit.   General: Well developed, well nourished,female appearing in no acute distress. Head: Normocephalic, atraumatic, sclera non-icteric, no xanthomas, nares are without discharge.  Neck: No carotid bruits. JVD not elevated.  Lungs: Respirations regular and unlabored, without wheezes or rales.  Heart: ***Regular rate and rhythm. No S3 or S4.  No murmur, no rubs, or gallops appreciated. Abdomen: Soft, non-tender, non-distended with normoactive bowel sounds. No hepatomegaly. No rebound/guarding. No obvious abdominal masses. Msk:  Strength and tone appear normal for age. No joint deformities or effusions. Extremities: No clubbing or cyanosis. No edema.  Distal pedal pulses are 2+ bilaterally. Neuro: Alert and oriented X 3. Moves all extremities spontaneously. No focal deficits noted. Psych:  Responds to questions appropriately with a normal affect. Skin: No rashes or lesions noted  Wt Readings from Last 3 Encounters:  05/23/16 127 lb (57.6 kg)    05/21/16 128 lb 4.8 oz (58.2 kg)  05/15/16 128 lb 4.8 oz (58.2 kg)        Studies/Labs Reviewed:   EKG:  EKG is*** ordered today.  The ekg ordered today demonstrates ***  Recent Labs: 04/30/2016: B Natriuretic Peptide 234.0; Magnesium 1.7; TSH 0.310 05/23/2016: ALT 18; BUN 18; Creatinine, Ser 0.70; Hemoglobin 12.0; Platelets 60; Potassium 3.5; Sodium 136   Lipid Panel    Component Value Date/Time   CHOL 107 05/03/2016 1403    Additional studies/ records that were reviewed today include:  ***  Assessment:    No diagnosis found.   Plan:   In order of problems listed above:  1. ***    Medication Adjustments/Labs and Tests Ordered: Current medicines are reviewed at length with the patient today.  Concerns regarding medicines are outlined above.  Medication changes, Labs and Tests ordered today are listed in the Patient Instructions below. There are no Patient Instructions on file for this visit.   Arna Medici, [place]  05/28/2016 8:49 PM    [facility] [place], [place] [place], [place]  [address] Phone: [phone]; Fax: [phone]  [address], [place], [place] [address] Phone: [phone]

## 2016-05-29 ENCOUNTER — Ambulatory Visit: Payer: Medicare HMO | Admitting: Student

## 2016-05-31 ENCOUNTER — Encounter: Payer: Self-pay | Admitting: *Deleted

## 2016-05-31 DIAGNOSIS — J90 Pleural effusion, not elsewhere classified: Secondary | ICD-10-CM

## 2016-06-01 ENCOUNTER — Encounter (HOSPITAL_COMMUNITY): Payer: Medicare HMO

## 2016-06-01 ENCOUNTER — Encounter (HOSPITAL_COMMUNITY): Payer: Self-pay

## 2016-06-04 ENCOUNTER — Ambulatory Visit: Payer: Medicare HMO | Admitting: Nurse Practitioner

## 2016-06-04 ENCOUNTER — Other Ambulatory Visit: Payer: Medicare HMO

## 2016-06-05 ENCOUNTER — Telehealth: Payer: Self-pay | Admitting: *Deleted

## 2016-06-05 NOTE — Telephone Encounter (Signed)
Oncology Nurse Navigator Documentation  Oncology Nurse Navigator Flowsheets 06/05/2016  Navigator Location [facility]  Navigator Encounter Type Telephone  Telephone Incoming Call/I received a call from [facility].  I was told patient was discharged on 05/25/16 to home.  I asked them for a phone number they had. They have the same phone number.   Treatment Phase Pre-Tx/Tx Discussion  Barriers/Navigation Needs Coordination of Care  Interventions Coordination of Care  Coordination of Care Appts  Acuity Level 1  Time Spent with Patient 15

## 2016-06-05 NOTE — Telephone Encounter (Signed)
Oncology Nurse Navigator Documentation  Oncology Nurse Navigator Flowsheets 06/05/2016  Navigator Location [facility]  Navigator Encounter Type Telephone  Telephone Outgoing Call/I called Sheri Ayala to check on her.  She missed her appt with Ned Card yesterday.  I called Sheri Ayala and her emergency contact phone number but was unable to reach or leave vm message.   Treatment Phase Pre-Tx/Tx Discussion  Barriers/Navigation Needs Coordination of Care  Interventions Coordination of Care  Coordination of Care Appts  Acuity Level 1  Time Spent with Patient 15

## 2016-06-06 ENCOUNTER — Telehealth: Payer: Self-pay | Admitting: *Deleted

## 2016-06-06 NOTE — Telephone Encounter (Signed)
Oncology Nurse Navigator Documentation  Oncology Nurse Navigator Flowsheets 06/06/2016  Navigator Location [facility]  Navigator Encounter Type Telephone  Telephone Outgoing Call/I called to check on Mr. Searing regarding her missed appt.  I was unable to reach or leave a vm message.   Treatment Phase Pre-Tx/Tx Discussion  Barriers/Navigation Needs Coordination of Care  Interventions Coordination of Care  Coordination of Care Appts  Acuity Level 1  Time Spent with Patient 15

## 2016-06-08 ENCOUNTER — Telehealth: Payer: Self-pay | Admitting: *Deleted

## 2016-06-08 NOTE — Telephone Encounter (Signed)
Oncology Nurse Navigator Documentation  Oncology Nurse Navigator Flowsheets 06/08/2016  Navigator Location [facility]  Navigator Encounter Type Telephone  Telephone Outgoing Call/I called to follow up with Ms. [place] today. I was able to reach her.  She spoke about her recent hospital stay.  I asked that she call central scheduling to schedule her PET scan.  I gave her the phone number and updated her that scan has been authorized.  I said once the scan was scheduled she will be call for an appt with Dr. Benay Spice. She was thankful for the call.   Treatment Phase Pre-Tx/Tx Discussion  Barriers/Navigation Needs Coordination of Care  Interventions Coordination of Care  Coordination of Care Other  Acuity Level 1  Time Spent with Patient 15

## 2016-06-12 ENCOUNTER — Telehealth: Payer: Self-pay | Admitting: *Deleted

## 2016-06-12 NOTE — Telephone Encounter (Signed)
Oncology Nurse Navigator Documentation  Oncology Nurse Navigator Flowsheets 06/12/2016  Navigator Location [facility]  Navigator Encounter Type Telephone/I called Ms. Hargan today to see if she has scheduled her PET scan.  She states she will call today.  She said she has been busy.  I updated her that once PET is scheduled, she will get a call about an appt with Dr. Benay Spice.    Telephone Outgoing Call  Treatment Phase Pre-Tx/Tx Discussion  Barriers/Navigation Needs Education  Education Other  Interventions Education  Acuity Level 1  Time Spent with Patient 15

## 2016-06-19 ENCOUNTER — Telehealth: Payer: Self-pay | Admitting: *Deleted

## 2016-06-19 NOTE — Telephone Encounter (Signed)
Oncology Nurse Navigator Documentation  Oncology Nurse Navigator Flowsheets 06/19/2016  Navigator Location [facility]  Navigator Encounter Type Telephone  Telephone Outgoing Call/I called Ms. Aoun to follow up on her PET scan.  I spoke with her last week and gave her the phone number to call to set up PET scan.  She stated she would.  I do not see that it has been scheduled.  I called patient and her emergency contact phone number but am unable to reach or leave a vm message.   Treatment Phase Pre-Tx/Tx Discussion  Barriers/Navigation Needs Education  Education Other  Interventions Education  Acuity Level 1  Time Spent with Patient 15

## 2016-06-22 ENCOUNTER — Telehealth: Payer: Self-pay | Admitting: *Deleted

## 2016-06-22 NOTE — Telephone Encounter (Signed)
Oncology Nurse Navigator Documentation  Oncology Nurse Navigator Flowsheets 06/22/2016  Navigator Location [facility]  Navigator Encounter Type Telephone/I called Ms. Klutts today to follow up on her PET scan.  I spoke with Ms. Beightol.  She states she has been too busy to call to scheduled. I asked if she has the number to central scheduling to call and schedule.  She states she does and she will call Monday.    Telephone Outgoing Call  Treatment Phase Pre-Tx/Tx Discussion  Barriers/Navigation Needs Education  Education Other  Interventions Education  Acuity Level 1  Time Spent with Patient 15

## 2016-06-28 ENCOUNTER — Telehealth: Payer: Self-pay | Admitting: *Deleted

## 2016-06-28 NOTE — Telephone Encounter (Signed)
Oncology Nurse Navigator Documentation  Oncology Nurse Navigator Flowsheets 06/28/2016  Navigator Location [facility]  Navigator Encounter Type Telephone  Telephone Outgoing Call/I called Mr. Spillane today.  I was unable to reach her or leave a vm message.   Treatment Phase Pre-Tx/Tx Discussion  Barriers/Navigation Needs Education  Education Other  Interventions Education  Acuity Level 1  Time Spent with Patient 15

## 2016-07-10 ENCOUNTER — Telehealth: Payer: Self-pay | Admitting: *Deleted

## 2016-07-10 NOTE — Telephone Encounter (Signed)
Oncology Nurse Navigator Documentation  Oncology Nurse Navigator Flowsheets 07/10/2016  Navigator Location [facility]  Navigator Encounter Type Telephone  Telephone Outgoing Call/I called Sheri Ayala today to follow up on how she was doing. She states she had sore throat.  I listened as she explained.  I asked if she has called to schedule her PET scan. She has not called.  I gave her the phone number to call and make the appt.  She was thankful for the help.   Treatment Phase Pre-Tx/Tx Discussion  Barriers/Navigation Needs Education  Education Other  Interventions Education  Acuity Level 1  Time Spent with Patient 15

## 2016-07-15 ENCOUNTER — Encounter (HOSPITAL_COMMUNITY): Payer: Self-pay | Admitting: Emergency Medicine

## 2016-07-15 ENCOUNTER — Inpatient Hospital Stay (HOSPITAL_COMMUNITY)
Admission: EM | Admit: 2016-07-15 | Discharge: 2016-07-27 | DRG: 871 | Disposition: A | Discharge status: Home Health | Payer: Medicare HMO | Attending: Internal Medicine | Admitting: Internal Medicine

## 2016-07-15 ENCOUNTER — Emergency Department (HOSPITAL_COMMUNITY): Payer: Medicare HMO

## 2016-07-15 DIAGNOSIS — Z9981 Dependence on supplemental oxygen: Secondary | ICD-10-CM

## 2016-07-15 DIAGNOSIS — J441 Chronic obstructive pulmonary disease with (acute) exacerbation: Secondary | ICD-10-CM

## 2016-07-15 DIAGNOSIS — J189 Pneumonia, unspecified organism: Secondary | ICD-10-CM

## 2016-07-15 DIAGNOSIS — Z972 Presence of dental prosthetic device (complete) (partial): Secondary | ICD-10-CM

## 2016-07-15 DIAGNOSIS — E875 Hyperkalemia: Secondary | ICD-10-CM | POA: Diagnosis not present

## 2016-07-15 DIAGNOSIS — M25511 Pain in right shoulder: Secondary | ICD-10-CM | POA: Diagnosis present

## 2016-07-15 DIAGNOSIS — I482 Chronic atrial fibrillation, unspecified: Secondary | ICD-10-CM | POA: Diagnosis present

## 2016-07-15 DIAGNOSIS — C9110 Chronic lymphocytic leukemia of B-cell type not having achieved remission: Secondary | ICD-10-CM | POA: Diagnosis present

## 2016-07-15 DIAGNOSIS — E872 Acidosis: Secondary | ICD-10-CM | POA: Diagnosis present

## 2016-07-15 DIAGNOSIS — F419 Anxiety disorder, unspecified: Secondary | ICD-10-CM | POA: Diagnosis present

## 2016-07-15 DIAGNOSIS — Z90710 Acquired absence of both cervix and uterus: Secondary | ICD-10-CM

## 2016-07-15 DIAGNOSIS — F17210 Nicotine dependence, cigarettes, uncomplicated: Secondary | ICD-10-CM | POA: Diagnosis present

## 2016-07-15 DIAGNOSIS — Z8673 Personal history of transient ischemic attack (TIA), and cerebral infarction without residual deficits: Secondary | ICD-10-CM

## 2016-07-15 DIAGNOSIS — D72829 Elevated white blood cell count, unspecified: Secondary | ICD-10-CM | POA: Diagnosis present

## 2016-07-15 DIAGNOSIS — D638 Anemia in other chronic diseases classified elsewhere: Secondary | ICD-10-CM | POA: Diagnosis present

## 2016-07-15 DIAGNOSIS — J181 Lobar pneumonia, unspecified organism: Secondary | ICD-10-CM | POA: Diagnosis present

## 2016-07-15 DIAGNOSIS — I480 Paroxysmal atrial fibrillation: Secondary | ICD-10-CM | POA: Diagnosis present

## 2016-07-15 DIAGNOSIS — J90 Pleural effusion, not elsewhere classified: Secondary | ICD-10-CM | POA: Diagnosis present

## 2016-07-15 DIAGNOSIS — C3410 Malignant neoplasm of upper lobe, unspecified bronchus or lung: Secondary | ICD-10-CM

## 2016-07-15 DIAGNOSIS — D696 Thrombocytopenia, unspecified: Secondary | ICD-10-CM | POA: Diagnosis present

## 2016-07-15 DIAGNOSIS — I700 Atherosclerosis of aorta: Secondary | ICD-10-CM | POA: Diagnosis present

## 2016-07-15 DIAGNOSIS — I071 Rheumatic tricuspid insufficiency: Secondary | ICD-10-CM | POA: Diagnosis present

## 2016-07-15 DIAGNOSIS — K5900 Constipation, unspecified: Secondary | ICD-10-CM

## 2016-07-15 DIAGNOSIS — R590 Localized enlarged lymph nodes: Secondary | ICD-10-CM | POA: Diagnosis present

## 2016-07-15 DIAGNOSIS — I2510 Atherosclerotic heart disease of native coronary artery without angina pectoris: Secondary | ICD-10-CM | POA: Diagnosis present

## 2016-07-15 DIAGNOSIS — Z7951 Long term (current) use of inhaled steroids: Secondary | ICD-10-CM

## 2016-07-15 DIAGNOSIS — R21 Rash and other nonspecific skin eruption: Secondary | ICD-10-CM | POA: Diagnosis not present

## 2016-07-15 DIAGNOSIS — Z881 Allergy status to other antibiotic agents status: Secondary | ICD-10-CM

## 2016-07-15 DIAGNOSIS — B370 Candidal stomatitis: Secondary | ICD-10-CM

## 2016-07-15 DIAGNOSIS — Z880 Allergy status to penicillin: Secondary | ICD-10-CM

## 2016-07-15 DIAGNOSIS — Z01818 Encounter for other preprocedural examination: Secondary | ICD-10-CM

## 2016-07-15 DIAGNOSIS — J9811 Atelectasis: Secondary | ICD-10-CM | POA: Diagnosis present

## 2016-07-15 DIAGNOSIS — C3492 Malignant neoplasm of unspecified part of left bronchus or lung: Secondary | ICD-10-CM

## 2016-07-15 DIAGNOSIS — Z7901 Long term (current) use of anticoagulants: Secondary | ICD-10-CM | POA: Diagnosis not present

## 2016-07-15 DIAGNOSIS — R918 Other nonspecific abnormal finding of lung field: Secondary | ICD-10-CM | POA: Diagnosis not present

## 2016-07-15 DIAGNOSIS — F329 Major depressive disorder, single episode, unspecified: Secondary | ICD-10-CM | POA: Diagnosis present

## 2016-07-15 DIAGNOSIS — Z8249 Family history of ischemic heart disease and other diseases of the circulatory system: Secondary | ICD-10-CM

## 2016-07-15 DIAGNOSIS — C7951 Secondary malignant neoplasm of bone: Secondary | ICD-10-CM | POA: Diagnosis present

## 2016-07-15 DIAGNOSIS — L270 Generalized skin eruption due to drugs and medicaments taken internally: Secondary | ICD-10-CM | POA: Diagnosis present

## 2016-07-15 DIAGNOSIS — Z79899 Other long term (current) drug therapy: Secondary | ICD-10-CM

## 2016-07-15 DIAGNOSIS — R040 Epistaxis: Secondary | ICD-10-CM | POA: Diagnosis present

## 2016-07-15 DIAGNOSIS — R079 Chest pain, unspecified: Secondary | ICD-10-CM

## 2016-07-15 DIAGNOSIS — J440 Chronic obstructive pulmonary disease with acute lower respiratory infection: Secondary | ICD-10-CM | POA: Diagnosis present

## 2016-07-15 DIAGNOSIS — Z8701 Personal history of pneumonia (recurrent): Secondary | ICD-10-CM

## 2016-07-15 DIAGNOSIS — J9621 Acute and chronic respiratory failure with hypoxia: Secondary | ICD-10-CM

## 2016-07-15 DIAGNOSIS — A419 Sepsis, unspecified organism: Secondary | ICD-10-CM | POA: Diagnosis present

## 2016-07-15 DIAGNOSIS — C3490 Malignant neoplasm of unspecified part of unspecified bronchus or lung: Secondary | ICD-10-CM

## 2016-07-15 DIAGNOSIS — D72818 Other decreased white blood cell count: Secondary | ICD-10-CM | POA: Diagnosis not present

## 2016-07-15 DIAGNOSIS — J449 Chronic obstructive pulmonary disease, unspecified: Secondary | ICD-10-CM | POA: Diagnosis not present

## 2016-07-15 DIAGNOSIS — Z91018 Allergy to other foods: Secondary | ICD-10-CM

## 2016-07-15 DIAGNOSIS — I4891 Unspecified atrial fibrillation: Secondary | ICD-10-CM | POA: Diagnosis not present

## 2016-07-15 DIAGNOSIS — M5489 Other dorsalgia: Secondary | ICD-10-CM | POA: Diagnosis not present

## 2016-07-15 LAB — COMPREHENSIVE METABOLIC PANEL
ALT: 49 U/L (ref 14–54)
AST: 40 U/L (ref 15–41)
Albumin: 3.7 g/dL (ref 3.5–5.0)
Alkaline Phosphatase: 68 U/L (ref 38–126)
Anion gap: 7 (ref 5–15)
BUN: 19 mg/dL (ref 6–20)
CHLORIDE: 104 mmol/L (ref 101–111)
CO2: 29 mmol/L (ref 22–32)
CREATININE: 0.74 mg/dL (ref 0.44–1.00)
Calcium: 8.9 mg/dL (ref 8.9–10.3)
GFR calc Af Amer: >60 mL/min (ref >60)
GFR calc non Af Amer: >60 mL/min (ref >60)
Glucose, Bld: 101 mg/dL — ABNORMAL HIGH (ref 65–99)
Potassium: 3.9 mmol/L (ref 3.5–5.1)
SODIUM: 140 mmol/L (ref 135–145)
Total Bilirubin: 0.6 mg/dL (ref 0.3–1.2)
Total Protein: 5.6 g/dL — ABNORMAL LOW (ref 6.5–8.1)

## 2016-07-15 LAB — CBC WITH DIFFERENTIAL/PLATELET
Basophils Absolute: 0 10*3/uL (ref 0.0–0.1)
Basophils Relative: 0 %
EOS ABS: 0 10*3/uL (ref 0.0–0.7)
EOS PCT: 0 %
HCT: 43.7 % (ref 36.0–46.0)
Hemoglobin: 13.4 g/dL (ref 12.0–15.0)
Lymphocytes Relative: 52 %
Lymphs Abs: 10.9 10*3/uL — ABNORMAL HIGH (ref 0.7–4.0)
MCH: 28.6 pg (ref 26.0–34.0)
MCHC: 30.7 g/dL (ref 30.0–36.0)
MCV: 93.2 fL (ref 78.0–100.0)
MONO ABS: 0.2 10*3/uL (ref 0.1–1.0)
Monocytes Relative: 1 %
NEUTROS PCT: 47 %
Neutro Abs: 9.8 10*3/uL — ABNORMAL HIGH (ref 1.7–7.7)
PLATELETS: 133 10*3/uL — AB (ref 150–400)
RBC: 4.69 MIL/uL (ref 3.87–5.11)
RDW: 15.5 % (ref 11.5–15.5)
WBC: 20.9 10*3/uL — AB (ref 4.0–10.5)

## 2016-07-15 LAB — D-DIMER, QUANTITATIVE: D-Dimer, Quant: 0.75 ug/mL-FEU — ABNORMAL HIGH (ref 0.00–0.50)

## 2016-07-15 LAB — I-STAT CG4 LACTIC ACID, ED
LACTIC ACID, VENOUS: 2.15 mmol/L — AB (ref 0.5–1.9)
Lactic Acid, Venous: 1.74 mmol/L (ref 0.5–1.9)

## 2016-07-15 LAB — I-STAT TROPONIN, ED
TROPONIN I, POC: 0.01 ng/mL (ref 0.00–0.08)
Troponin i, poc: 0 ng/mL (ref 0.00–0.08)

## 2016-07-15 LAB — BRAIN NATRIURETIC PEPTIDE: B Natriuretic Peptide: 335.5 pg/mL — ABNORMAL HIGH (ref 0.0–100.0)

## 2016-07-15 LAB — PROTIME-INR
INR: 1.12
Prothrombin Time: 14.5 seconds (ref 11.4–15.2)

## 2016-07-15 MED ORDER — ALPRAZOLAM 0.5 MG PO TABS
0.5000 mg | ORAL_TABLET | Freq: Two times a day (BID) | ORAL | Status: DC | PRN
  Administered 2016-07-15 – 2016-07-27 (×16): 0.5 mg via ORAL
  Filled 2016-07-15 (×16): qty 1

## 2016-07-15 MED ORDER — SERTRALINE HCL 50 MG PO TABS
25.0000 mg | ORAL_TABLET | Freq: Every day | ORAL | Status: DC
  Administered 2016-07-16 – 2016-07-27 (×11): 25 mg via ORAL
  Filled 2016-07-15 (×14): qty 1

## 2016-07-15 MED ORDER — IPRATROPIUM-ALBUTEROL 0.5-2.5 (3) MG/3ML IN SOLN
3.0000 mL | Freq: Once | RESPIRATORY_TRACT | Status: AC
  Administered 2016-07-15: 3 mL via RESPIRATORY_TRACT
  Filled 2016-07-15: qty 3

## 2016-07-15 MED ORDER — ACETAMINOPHEN 325 MG PO TABS: 650.0000 mg | ORAL_TABLET | Freq: Four times a day (QID) | ORAL | Status: DC | PRN

## 2016-07-15 MED ORDER — IOPAMIDOL (ISOVUE-370) INJECTION 76%
INTRAVENOUS | Status: AC
  Administered 2016-07-15: 100 mL
  Filled 2016-07-15: qty 100

## 2016-07-15 MED ORDER — IPRATROPIUM-ALBUTEROL 0.5-2.5 (3) MG/3ML IN SOLN
3.0000 mL | Freq: Four times a day (QID) | RESPIRATORY_TRACT | Status: DC
  Filled 2016-07-15 (×2): qty 3

## 2016-07-15 MED ORDER — SODIUM CHLORIDE 0.9 % IV BOLUS (SEPSIS)
1000.0000 mL | Freq: Once | INTRAVENOUS | Status: AC
  Administered 2016-07-15: 1000 mL via INTRAVENOUS

## 2016-07-15 MED ORDER — SODIUM CHLORIDE 0.9% FLUSH
3.0000 mL | Freq: Two times a day (BID) | INTRAVENOUS | Status: DC
  Administered 2016-07-15 – 2016-07-23 (×12): 3 mL via INTRAVENOUS

## 2016-07-15 MED ORDER — IPRATROPIUM-ALBUTEROL 0.5-2.5 (3) MG/3ML IN SOLN: 3.0000 mL | RESPIRATORY_TRACT | Status: DC | PRN

## 2016-07-15 MED ORDER — POLYETHYLENE GLYCOL 3350 17 G PO PACK
17.0000 g | PACK | Freq: Every day | ORAL | Status: DC
  Administered 2016-07-16 – 2016-07-19 (×4): 17 g via ORAL
  Filled 2016-07-15 (×5): qty 1

## 2016-07-15 MED ORDER — SODIUM CHLORIDE 0.9% FLUSH
3.0000 mL | Freq: Two times a day (BID) | INTRAVENOUS | Status: DC
  Administered 2016-07-15 – 2016-07-25 (×15): 3 mL via INTRAVENOUS

## 2016-07-15 MED ORDER — SODIUM CHLORIDE 0.9 % IV SOLN
250.0000 mL | INTRAVENOUS | Status: DC | PRN
Start: 2016-07-15 — End: 2016-07-25

## 2016-07-15 MED ORDER — SODIUM CHLORIDE 0.9% FLUSH: 3.0000 mL | INTRAVENOUS | Status: DC | PRN

## 2016-07-15 MED ORDER — DILTIAZEM HCL ER COATED BEADS 180 MG PO CP24
180.0000 mg | ORAL_CAPSULE | Freq: Every day | ORAL | Status: DC
  Administered 2016-07-16 – 2016-07-26 (×10): 180 mg via ORAL
  Filled 2016-07-15 (×10): qty 1

## 2016-07-15 MED ORDER — DEXTROSE 5 % IV SOLN
500.0000 mg | INTRAVENOUS | Status: DC
  Administered 2016-07-15 – 2016-07-16 (×2): 500 mg via INTRAVENOUS
  Filled 2016-07-15 (×2): qty 500

## 2016-07-15 MED ORDER — ENSURE ENLIVE PO LIQD
237.0000 mL | Freq: Two times a day (BID) | ORAL | Status: DC
  Administered 2016-07-16 – 2016-07-27 (×16): 237 mL via ORAL

## 2016-07-15 MED ORDER — TEMAZEPAM 15 MG PO CAPS
30.0000 mg | ORAL_CAPSULE | Freq: Every day | ORAL | Status: DC
  Administered 2016-07-15 – 2016-07-26 (×12): 30 mg via ORAL
  Filled 2016-07-15 (×12): qty 2

## 2016-07-15 MED ORDER — ONDANSETRON HCL 4 MG PO TABS: 4.0000 mg | ORAL_TABLET | Freq: Four times a day (QID) | ORAL | Status: DC | PRN

## 2016-07-15 MED ORDER — METHYLPREDNISOLONE SODIUM SUCC 125 MG IJ SOLR
60.0000 mg | Freq: Four times a day (QID) | INTRAMUSCULAR | Status: DC
  Administered 2016-07-15 – 2016-07-16 (×2): 60 mg via INTRAVENOUS
  Filled 2016-07-15 (×2): qty 2

## 2016-07-15 MED ORDER — CYCLOBENZAPRINE HCL 5 MG PO TABS
5.0000 mg | ORAL_TABLET | Freq: Three times a day (TID) | ORAL | Status: DC
  Administered 2016-07-15 – 2016-07-27 (×34): 5 mg via ORAL
  Filled 2016-07-15 (×34): qty 1

## 2016-07-15 MED ORDER — ONDANSETRON HCL 4 MG/2ML IJ SOLN
4.0000 mg | Freq: Four times a day (QID) | INTRAMUSCULAR | Status: DC | PRN
  Administered 2016-07-23: 4 mg via INTRAVENOUS

## 2016-07-15 MED ORDER — ACETAMINOPHEN 650 MG RE SUPP: 650.0000 mg | Freq: Four times a day (QID) | RECTAL | Status: DC | PRN

## 2016-07-15 MED ORDER — DIPHENHYDRAMINE HCL 25 MG PO CAPS
25.0000 mg | ORAL_CAPSULE | ORAL | Status: DC | PRN
Start: 2016-07-15 — End: 2016-07-27

## 2016-07-15 MED ORDER — APIXABAN 5 MG PO TABS
5.0000 mg | ORAL_TABLET | Freq: Two times a day (BID) | ORAL | Status: DC
  Administered 2016-07-15: 5 mg via ORAL
  Filled 2016-07-15: qty 1

## 2016-07-15 MED ORDER — HYDROCODONE-ACETAMINOPHEN 5-325 MG PO TABS
1.0000 | ORAL_TABLET | ORAL | Status: DC | PRN
  Administered 2016-07-15 – 2016-07-16 (×2): 1 via ORAL
  Administered 2016-07-16: 2 via ORAL
  Administered 2016-07-17: 1 via ORAL
  Administered 2016-07-17: 2 via ORAL
  Administered 2016-07-18: 1 via ORAL
  Administered 2016-07-18 – 2016-07-19 (×3): 2 via ORAL
  Filled 2016-07-15 (×2): qty 2
  Filled 2016-07-15: qty 1
  Filled 2016-07-15: qty 2
  Filled 2016-07-15 (×2): qty 1
  Filled 2016-07-15: qty 2
  Filled 2016-07-15 (×2): qty 1
  Filled 2016-07-15: qty 2

## 2016-07-15 MED ORDER — BISACODYL 5 MG PO TBEC
5.0000 mg | DELAYED_RELEASE_TABLET | Freq: Every day | ORAL | Status: DC | PRN
  Administered 2016-07-18 – 2016-07-19 (×2): 5 mg via ORAL
  Filled 2016-07-15 (×2): qty 1

## 2016-07-15 MED ORDER — MOMETASONE FURO-FORMOTEROL FUM 200-5 MCG/ACT IN AERO
2.0000 | INHALATION_SPRAY | Freq: Two times a day (BID) | RESPIRATORY_TRACT | Status: DC
  Administered 2016-07-16 – 2016-07-27 (×21): 2 via RESPIRATORY_TRACT
  Filled 2016-07-15 (×2): qty 8.8

## 2016-07-15 NOTE — ED Provider Notes (Signed)
[facility] DEPT Provider Note   CSN: [id number] Arrival date & time: 07/15/16  1222     History   Chief Complaint Chief Complaint  Patient presents with  . Shortness of Breath  . Cough    HPI Sheri Ayala is a 66 y.o. female with a past medical history significant for COPD, CLL, Stroke, asthma, CAD, atrial fibrillation not on anticoagualtion and reported 14 episodes of pneumonia who presents with 4 days of worsening chills, congestion, and productive cough. Patient reports that this feels "like another round of pneumonia". She says that over the last 4 days, she has developed symptoms of productive cough, shortness of breath with the coughing spells, and chest tightness with her cough. She was brought in by EMS who gave her a breathing treatment, Solu-Medrol, and magnesium due to wheezing. Patient denies specific chest pain but reports tightness with her coughing. She denies diaphoresis, nausea, vomiting, conservation, diarrhea, or urine changes. She says that she was admitted for one month in the hospital back in January for pneumonia. She reports that she was taken off of milk was for atrial fibrillation at that time due to an allergic reaction. She says she is no longer on any anticoagulation therapy.     The history is provided by the patient and medical records. No language interpreter was used.  Shortness of Breath  This is a recurrent problem. The average episode lasts 4 days. The problem occurs continuously.The current episode started more than 2 days ago. The problem has been gradually worsening. Associated symptoms include a fever, rhinorrhea, cough, sputum production and wheezing. Pertinent negatives include no headaches, no chest pain, no syncope, no vomiting, no abdominal pain, no rash, no leg pain and no leg swelling. She has tried beta-agonist inhalers for the symptoms. She has had prior hospitalizations. She has had prior ED visits. Associated medical issues include COPD and  chronic lung disease.    Past Medical History:  Diagnosis Date  . Allergy   . Anxiety   . Asthma   . Atrial fibrillation ([place]) 04/2016  . Bronchospasm   . Cerebrovascular accident (CVA) ([place]) 08/28/2015  . Chronic lymphocytic leukemia ([place]) 2004  . COPD (chronic obstructive pulmonary disease) ([place])   . Depression   . Emphysema of lung ([place])   . Frontal sinusitis November 2012  . H/O viral encephalitis   . Headache(784.0)   . Heart murmur   . Mitral prolapse 11/24/2015  . Mitral regurgitation 11/24/2015  . Olecranon bursitis of left elbow November 2012.   in past  . Pleurisy   . Pneumonia    13 times  . Rhinitis   . Thrombocytopenia ([place])   . Tobacco abuse   . Tricuspid valve regurgitation 11/24/2015    Patient Active Problem List   Diagnosis Date Noted  . Pleural effusion   . S/P thoracentesis   . Pleural effusion on right   . Abnormal TSH 05/01/2016  . History of CVA (cerebrovascular accident) 05/01/2016  . HCAP (healthcare-associated pneumonia) 04/30/2016  . Chronic a-fib ([place]) 04/30/2016  . Severe protein-calorie malnutrition ([place])   . Acute on chronic respiratory failure with hypoxemia ([place]) 04/18/2016  . COPD GOLD III  04/10/2016  . Mass of left lung 04/10/2016  . Right lower lobe pneumonia ([place]) 01/30/2015  . Generalized weakness 01/30/2015  . COPD (chronic obstructive pulmonary disease) ([place]) 01/29/2015  . SOB (shortness of breath) 12/24/2012  . Dizziness 12/22/2012  . Anemia 03/28/2011  . Headache in front of head 03/27/2011  .  Thrombocytopenia ([place]) 03/27/2011  . Cellulitis of elbow 03/09/2011  . CLL (chronic lymphocytic leukemia) ([place]) 03/09/2011  . Cigarette smoker 03/09/2011    Past Surgical History:  Procedure Laterality Date  . ABDOMINAL HYSTERECTOMY    . COLONOSCOPY    . UPPER GASTROINTESTINAL ENDOSCOPY    . VIDEO BRONCHOSCOPY Bilateral 04/18/2016   Procedure: VIDEO BRONCHOSCOPY WITHOUT FLUORO;  Surgeon: Tanda Rockers, MD;  Location: [facility]  ENDOSCOPY;  Service: Cardiopulmonary;  Laterality: Bilateral;    OB History    Gravida Para Term Preterm AB Living   '2 2 2     2   '$ SAB TAB Ectopic Multiple Live Births                   Home Medications    Prior to Admission medications   Medication Sig Start Date End Date Taking? Authorizing Provider  albuterol (PROVENTIL) (2.5 MG/3ML) 0.083% nebulizer solution Take 3 mLs (2.5 mg total) by nebulization every 4 (four) hours as needed for wheezing or shortness of breath. 06/21/13   Francine Graven, DO  ALPRAZolam Duanne Moron) 0.5 MG tablet Take 1 tablet (0.5 mg total) by mouth 2 (two) times daily as needed for anxiety or sleep. 05/05/16   Orson Eva, MD  apixaban (ELIQUIS) 5 MG TABS tablet Take 1 tablet (5 mg total) by mouth 2 (two) times daily. 05/05/16   Orson Eva, MD  budesonide-formoterol [facility]) 160-4.5 MCG/ACT inhaler Take 2 puffs first thing in am and then another 2 puffs about 12 hours later. 04/29/16   Tanda Rockers, MD  cyclobenzaprine (FLEXERIL) 5 MG tablet Take 1 tablet (5 mg total) by mouth 3 (three) times daily. 05/05/16   Orson Eva, MD  diltiazem (CARDIZEM CD) 180 MG 24 hr capsule Take 1 capsule (180 mg total) by mouth daily. 05/08/16   Orson Eva, MD  diphenhydrAMINE (BENADRYL) 25 mg capsule Take 1 capsule (25 mg total) by mouth every 4 (four) hours as needed (rash). 05/23/16   Fransico Meadow, PA-C  feeding supplement, ENSURE ENLIVE, (ENSURE ENLIVE) LIQD Take 237 mLs by mouth 2 (two) times daily between meals. 05/06/16   Orson Eva, MD  furosemide (LASIX) 40 MG tablet Take 1 tablet (40 mg total) by mouth every other day. 05/08/16   Orson Eva, MD  OXYGEN Inhale 2 L into the lungs continuous.    Historical Provider, MD  polyethylene glycol (MIRALAX / GLYCOLAX) packet Take 17 g by mouth daily.    Historical Provider, MD  predniSONE (DELTASONE) 10 MG tablet 6,5,4,3,2,1 taper 05/23/16   Fransico Meadow, PA-C  sertraline (ZOLOFT) 25 MG tablet Take 25 mg by mouth daily. 04/05/16   Historical  Provider, MD  temazepam (RESTORIL) 30 MG capsule Take 1 capsule (30 mg total) by mouth at bedtime. 05/05/16   Orson Eva, MD    Family History Family History  Problem Relation Age of Onset  . Aneurysm Mother   . Liver disease Father   . Congestive Heart Failure Brother   . Heart disease Brother   . Colon cancer Neg Hx   . Esophageal cancer Neg Hx   . Pancreatic cancer Neg Hx   . Rectal cancer Neg Hx   . Stomach cancer Neg Hx     Social History Social History  Substance Use Topics  . Smoking status: Current Every Day Smoker    Packs/day: 0.50    Years: 45.00    Types: Cigarettes, E-cigarettes  . Smokeless tobacco: Never Used  . Alcohol use No  Allergies   Citrus; Doxycycline; and Penicillins   Review of Systems Review of Systems  Constitutional: Positive for chills, fatigue and fever. Negative for diaphoresis.  HENT: Positive for congestion and rhinorrhea.   Eyes: Negative for visual disturbance.  Respiratory: Positive for cough, sputum production, shortness of breath and wheezing. Negative for chest tightness and stridor.   Cardiovascular: Negative for chest pain, palpitations, leg swelling and syncope.  Gastrointestinal: Negative for abdominal pain, constipation, diarrhea, nausea and vomiting.  Genitourinary: Negative for dysuria.  Musculoskeletal: Negative for back pain.  Skin: Negative for rash and wound.  Neurological: Negative for light-headedness and headaches.  Psychiatric/Behavioral: Negative for agitation.     Physical Exam Updated Vital Signs BP 124/80 (BP Location: Right Arm)   Pulse 95   Temp 98.5 F (36.9 C) (Oral)   Resp 20   Ht '5\' 5"'$  (1.651 m)   Wt 127 lb (57.6 kg)   SpO2 100%   BMI 21.13 kg/m   Physical Exam  Constitutional: She is oriented to person, place, and time. She appears well-developed and well-nourished. No distress.  HENT:  Head: Normocephalic and atraumatic.  Right Ear: External ear normal.  Left Ear: External ear  normal.  Nose: Nose normal.  Mouth/Throat: Oropharynx is clear and moist. No oropharyngeal exudate.  Eyes: Conjunctivae and EOM are normal. Pupils are equal, round, and reactive to light.  Neck: Normal range of motion. Neck supple.  Cardiovascular: Normal heart sounds and intact distal pulses.  An irregular rhythm present. Tachycardia present.   No murmur heard. Pulmonary/Chest: No stridor. Tachypnea noted. No respiratory distress. She has wheezes. She has rhonchi. She exhibits no tenderness.  Abdominal: She exhibits no distension. There is no tenderness. There is no rebound.  Neurological: She is alert and oriented to person, place, and time. She has normal reflexes. She exhibits normal muscle tone. Coordination normal.  Skin: Skin is warm. No rash noted. She is not diaphoretic. No erythema.  Nursing note and vitals reviewed.    ED Treatments / Results  Labs (all labs ordered are listed, but only abnormal results are displayed) Labs Reviewed  CBC WITH DIFFERENTIAL/PLATELET - Abnormal; Notable for the following:       Result Value   WBC 20.9 (*)    Platelets 133 (*)    Neutro Abs 9.8 (*)    Lymphs Abs 10.9 (*)    All other components within normal limits  COMPREHENSIVE METABOLIC PANEL - Abnormal; Notable for the following:    Glucose, Bld 101 (*)    Total Protein 5.6 (*)    All other components within normal limits  D-DIMER, QUANTITATIVE (NOT AT [facility]) - Abnormal; Notable for the following:    D-Dimer, Quant 0.75 (*)    All other components within normal limits  BRAIN NATRIURETIC PEPTIDE - Abnormal; Notable for the following:    B Natriuretic Peptide 335.5 (*)    All other components within normal limits  BASIC METABOLIC PANEL - Abnormal; Notable for the following:    Glucose, Bld 113 (*)    Calcium 8.7 (*)    All other components within normal limits  GLUCOSE, CAPILLARY - Abnormal; Notable for the following:    Glucose-Capillary 121 (*)    All other components within normal  limits  I-STAT CG4 LACTIC ACID, ED - Abnormal; Notable for the following:    Lactic Acid, Venous 2.15 (*)    All other components within normal limits  CULTURE, EXPECTORATED SPUTUM-ASSESSMENT  PROTIME-INR  I-STAT CG4 LACTIC ACID, ED  Randolm Idol, ED  Randolm Idol, ED    EKG  EKG Interpretation  Date/Time:  Sunday July 15 2016 13:36:24 EDT Ventricular Rate:  105 PR Interval:    QRS Duration: 92 QT Interval:  326 QTC Calculation: 431 R Axis:   70 Text Interpretation:  Atrial fibrillation Nonspecific T abnormalities, lateral leads When compared to prior ECG, rate is faster.  No STEMI Confirmed by [facility] MD, [place] [phone]) on 07/15/2016 3:42:10 PM       Radiology Dg Chest 2 View  Result Date: 07/15/2016 CLINICAL DATA:  Shortness breath, cough for 1 week EXAM: CHEST  2 VIEW COMPARISON:  05/07/2016 FINDINGS: There is bilateral chronic interstitial thickening. The lungs are hyperinflated likely secondary to COPD. There is no focal parenchymal opacity. There is no pleural effusion or pneumothorax. The heart and mediastinal contours are unremarkable. The osseous structures are unremarkable. IMPRESSION: No active cardiopulmonary disease. Chronic interstitial disease. Electronically Signed   By: Kathreen Devoid   On: 07/15/2016 14:05   Ct Angio Chest Pe W And/or Wo Contrast  Result Date: 07/15/2016 CLINICAL DATA:  Leukemia and smoking history. Neck and back pain x1 week with dyspnea and nonproductive cough. EXAM: CT ANGIOGRAPHY CHEST WITH CONTRAST TECHNIQUE: Multidetector CT imaging of the chest was performed using the standard protocol during bolus administration of intravenous contrast. Multiplanar CT image reconstructions and MIPs were obtained to evaluate the vascular anatomy. CONTRAST:  100 cc Isovue 370 IV COMPARISON:  03/27/2016 FINDINGS: Cardiovascular: Normal size cardiac chambers without pericardial effusion or thickening. No aortic aneurysm or dissection. No acute pulmonary  embolus. Aortic atherosclerosis. Mediastinum/Nodes: 4 mm left thyroid calcification. Confluent mediastinal and left greater than right hilar soft tissue abnormalities consistent with adenopathy possibly associated with the patient's history of leukemia. Representative left paratracheal soft tissue confluence measures 2.6 cm in thickness with confluent left hilar soft tissue masses measuring 4.6 x 2.5 cm, series 401, image 44. The soft tissue densities narrow the left main pulmonary artery and branches to the lingula and left upper lobe with narrowing but not occlusion. Circumferential narrowing of the proximal pulmonary arteries to the left lower lobe are identified about the left hilum. Lungs/Pleura: Minimal apical pleuroparenchymal scarring at the right lung apex. 4 mm left lower lobe pulmonary nodule considered present and stable. Mild centrilobular emphysema. Opaque atelectatic appearance of the left upper lobe and lingula secondary to presumed postobstructive change from mediastinal and hilar soft tissue masses consistent with adenopathy. There is extrinsic occlusion of the bronchi to the left upper lobe, lingula and proximal left lower lobe. Trace bilateral pleural effusions with dependent atelectasis, left greater than right. Faint ground-glass opacities in the right lower lobe with areas of adjacent atelectasis and/or scarring. Small pulmonary nodular densities may be obscured by these opacities. There is mild bronchiectasis to the left lower lobe. Upper Abdomen: Stable 2.9 cm right adrenal nodule with hypo attenuation similar to prior. Negative left adrenal gland abnormality. The visualized kidneys, spleen, pancreas, stomach and liver demonstrate no acute findings. Musculoskeletal: No acute nor suspicious osseous lesions. There is mild dextroconvex curvature of the thoracic spine. Review of the MIP images confirms the above findings. IMPRESSION: 1. Postobstructive atelectasis involving the lingula and left  upper lobe secondary to mediastinal and left hilar masslike abnormalities presumably representing adenopathy in this patient with history of leukemia. Confluent soft tissue masses have increased in appearance and size within the mediastinum and left hilum since prior causing postobstructive change of the left upper lobe, lingular and proximal left lower  lobe bronchi. 2. Small left greater than right pleural effusions with atelectatic change. Centrilobular emphysema is stable. 3. Stable hypodense nodule of the right adrenal gland consistent with an adenoma. 4. Centrilobular emphysema. 5. Aortic atherosclerosis without aneurysm. 6. No acute central pulmonary embolus. Electronically Signed   By: Ashley Royalty M.D.   On: 07/15/2016 18:22    Procedures Procedures (including critical care time)  Medications Ordered in ED Medications  diphenhydrAMINE (BENADRYL) capsule 25 mg (not administered)  polyethylene glycol (MIRALAX / GLYCOLAX) packet 17 g (not administered)  ALPRAZolam (XANAX) tablet 0.5 mg (0.5 mg Oral Given 07/15/16 2323)  apixaban (ELIQUIS) tablet 5 mg (5 mg Oral Given 07/15/16 2325)  cyclobenzaprine (FLEXERIL) tablet 5 mg (5 mg Oral Given 07/15/16 2325)  diltiazem (CARDIZEM CD) 24 hr capsule 180 mg (not administered)  feeding supplement (ENSURE ENLIVE) (ENSURE ENLIVE) liquid 237 mL (not administered)  temazepam (RESTORIL) capsule 30 mg (30 mg Oral Given 07/15/16 2325)  mometasone-formoterol (DULERA) 200-5 MCG/ACT inhaler 2 puff (2 puffs Inhalation Given 07/16/16 0856)  sertraline (ZOLOFT) tablet 25 mg (not administered)  sodium chloride flush (NS) 0.9 % injection 3 mL (3 mLs Intravenous Given 07/15/16 2348)  sodium chloride flush (NS) 0.9 % injection 3 mL (3 mLs Intravenous Given 07/15/16 2347)  sodium chloride flush (NS) 0.9 % injection 3 mL (not administered)  0.9 %  sodium chloride infusion (not administered)  acetaminophen (TYLENOL) tablet 650 mg (not administered)    Or  acetaminophen (TYLENOL)  suppository 650 mg (not administered)  HYDROcodone-acetaminophen (NORCO/VICODIN) 5-325 MG per tablet 1-2 tablet (1 tablet Oral Given 07/15/16 2329)  bisacodyl (DULCOLAX) EC tablet 5 mg (not administered)  ondansetron (ZOFRAN) tablet 4 mg (not administered)    Or  ondansetron (ZOFRAN) injection 4 mg (not administered)  azithromycin (ZITHROMAX) 500 mg in dextrose 5 % 250 mL IVPB (500 mg Intravenous Given 07/15/16 2339)  ipratropium-albuterol (DUONEB) 0.5-2.5 (3) MG/3ML nebulizer solution 3 mL (not administered)  ipratropium-albuterol (DUONEB) 0.5-2.5 (3) MG/3ML nebulizer solution 3 mL (3 mLs Nebulization Given by Other 07/16/16 0856)  methylPREDNISolone sodium succinate (SOLU-MEDROL) 125 mg/2 mL injection 60 mg (not administered)  ipratropium-albuterol (DUONEB) 0.5-2.5 (3) MG/3ML nebulizer solution 3 mL (3 mLs Nebulization Given 07/15/16 1402)  iopamidol (ISOVUE-370) 76 % injection (100 mLs  Contrast Given 07/15/16 1729)  sodium chloride 0.9 % bolus 1,000 mL (1,000 mLs Intravenous New Bag/Given 07/15/16 1916)     Initial Impression / Assessment and Plan / ED Course  I have reviewed the triage vital signs and the nursing notes.  Pertinent labs & imaging results that were available during my care of the patient were reviewed by me and considered in my medical decision making (see chart for details).     Sheri Ayala is a 66 y.o. female with a past medical history significant for COPD, CLL, Stroke, asthma, CAD, atrial fibrillation not on anticoagualtion and reported 14 episodes of pneumonia who presents with 4 days of worsening chills, congestion, and productive cough.   history and exam are seen above.  On exam, patient had wheezing and coarse breath sounds bilaterally. Chest was nontender. Abdomen nontender. No lower extremity edema tenderness, or swelling. No focal neurologic deficits. Patient was hypoxic in the 57s while conversing. Patient was tachycardic in the 110-120 range. Patient denies history of  DVT or PE.  Given patient's symptoms, suspect URI or pneumonia exacerbating her chronic reactive lung disease. Patient already is feeling better after breathing treatment, steroids, and magnesium by EMS. Patient was however found to  have hypoxia with conversation. Patient continued to have the cough and shortness of breath.  Patient will be given another breathing treatment. Given patient's report stopping anticoagulation, patient will have d-dimer to look for PE as cause of her shortness of breath, tachycardia, and hypoxia. Patient will have repeat EKG, and lab testing to look for occult infection or pneumonia.  Anticipate reassessment following workup.  Care transferred to [facility] while awaiting CT results. Pt will likely need admission even if PE study negative for PE or PNA given her hypoxia with conversation despite steroids, magnesium, and nebulizers.   Care transferred in stable condition.    Final Clinical Impressions(s) / ED Diagnoses   Final diagnoses:  COPD exacerbation (HCC)     Clinical Impression: 1. COPD exacerbation ([place])     Disposition: Admit to Hospitalist service    Courtney Paris, MD 07/16/16 [phone]

## 2016-07-15 NOTE — ED Provider Notes (Signed)
  66 year old female presenting today with shortness of breath signed out to me at shift change by previous provider pending CT angiogram and disposition.  Please see previous providers note for full H&P.  Patient presented with infectious like complaints, shortness of breath, and coarse lung sounds.  She was tachycardic here, CT scan to rule out pulmonary embolism ordered.  Patient's presentation was so severe that she was given steroids, breathing treatment and magnesium by EMS.  Patient was noted to be hypoxic with conversation.  Patient's clinical presentation suspicious for pneumonia given such a significant past medical history and severity of symptoms.  CT scan to rule out PE with likely hospital admission.  Reassessment shows patient is still slightly tachypneic, she does not have any overt wheeze, but has diminished breath sounds bilateral.  Patient's CT shows no acute pulmonary embolism.  Patient has significant past medical history of pneumonia, COPD.  Patient with continued dyspnea after significant intervention, hospitalist will be consulted for admission for COPD exacerbation.  Vitals:   07/15/16 1900 07/15/16 2015  BP: 132/77 123/79  Pulse: (!) 110 100  Resp: 20 (!) 25  Temp:            Okey Regal, PA-C 07/15/16 2030    Gwenyth Allegra Tegeler, MD 07/16/16 [phone]

## 2016-07-15 NOTE — ED Triage Notes (Signed)
Pt in from home with c/o sob and cough x 1 week. Hx of smoking, PNA, and chronic bronchitis. Pt denies fevers, n/v/d. '15mg'$  Albuterol, 1 mg Atrovent, '125mg'$  Solumedrol and 2g Mg given by EMS. RLL wheezes, able to speak in full sentences.

## 2016-07-15 NOTE — ED Notes (Signed)
Attempted to call report

## 2016-07-15 NOTE — H&P (Signed)
History and Physical    Sheri Ayala IWP:[id number] DOB: November 14, 1950 DOA: 07/15/2016  PCP: Alvester Chou, NP   Patient coming from: Home  Chief Complaint: Cough, SOB  HPI: Sheri Ayala is a 66 y.o. female with medical history significant for COPD with chronic 2 L/m some low oxygen requirement, chronic lymphocytic leukemia, atrial fibrillation on Eliquis, history of CVA, anxiety, and insomnia who presents to the emergency department with 1 week of progressive exertional dyspnea and cough. Patient reports that she had been in her usual state of health until approximately one week ago when she noted the insidious development of worsening exertional dyspnea and a cough. Over the ensuing days, the symptoms progressively worsened despite her use of home inhalers. There is been no fevers or chills during this interval and no chest pain or palpitations. She denies any lower extremity swelling or tenderness, long distance travel, prolonged immobilization, or recent sick contacts. She has not been on antibiotics or steroids for this. She denies orthopnea. Symptoms are worse with any exertion and have worsened to the point where she has been symptomatic while at rest for the past couple days, prompting her presentation to the ED tonight. She was transported by ambulance and received 125 mg IV Solu-Medrol, duo nebs, and 2 g of magnesium en route.   ED Course: Upon arrival to the ED, patient is found to be afebrile, saturating in the high 80s, slightly tachycardic and tachypneic, and with stable blood pressure. EKG features atrial fibrillation and chest x-ray is negative for acute cardiopulmonary disease, but notable for chronic interstitial disease. Chemistry panel is unremarkable and CBC is notable for a leukocytosis to 20,900 with smudge cells, and a mild thrombocytopenia with platelets 133,000. Lactic acid was reassuring at 1.74 and troponin was negative 2. D-dimer was obtained and positive at 0.75. CTA PE study was  obtained and negative for PE, but notable for postobstructive atelectasis involving the lingula and left upper lobe secondary to mediastinal and left hilar masslike abnormalities which are presumed to be adenopathy related to the patient's leukemia and increased since the prior study. Patient was given a liter of normal saline and duo nebs in the ED. She reported some subjective improvement with the breathing treatment, but continues to be working to breathe while at rest and will need to be admitted to the hospital for ongoing evaluation and management of acute exacerbation in COPD.  Review of Systems:  All other systems reviewed and apart from HPI, are negative.  Past Medical History:  Diagnosis Date  . Allergy   . Anxiety   . Asthma   . Atrial fibrillation ([place]) 04/2016  . Bronchospasm   . Cerebrovascular accident (CVA) ([place]) 08/28/2015  . Chronic lymphocytic leukemia ([place]) 2004  . COPD (chronic obstructive pulmonary disease) ([place])   . Depression   . Emphysema of lung ([place])   . Frontal sinusitis November 2012  . H/O viral encephalitis   . Headache(784.0)   . Heart murmur   . Mitral prolapse 11/24/2015  . Mitral regurgitation 11/24/2015  . Olecranon bursitis of left elbow November 2012.   in past  . Pleurisy   . Pneumonia    13 times  . Rhinitis   . Thrombocytopenia ([place])   . Tobacco abuse   . Tricuspid valve regurgitation 11/24/2015    Past Surgical History:  Procedure Laterality Date  . ABDOMINAL HYSTERECTOMY    . COLONOSCOPY    . UPPER GASTROINTESTINAL ENDOSCOPY    . VIDEO BRONCHOSCOPY Bilateral 04/18/2016  Procedure: VIDEO BRONCHOSCOPY WITHOUT FLUORO;  Surgeon: Tanda Rockers, MD;  Location: [facility];  Service: Cardiopulmonary;  Laterality: Bilateral;     reports that she has been smoking Cigarettes and E-cigarettes.  She has a 22.50 pack-year smoking history. She has never used smokeless tobacco. She reports that she does not drink alcohol or use  drugs.  Allergies  Allergen Reactions  . Citrus     Burning in stomach   . Doxycycline     Caused pt daily headaches to be more severe   . Penicillins Hives, Nausea And Vomiting and Swelling    Patient reports tolerating ampicillin.  Has patient had a PCN reaction causing immediate rash, facial/tongue/throat swelling, SOB or lightheadedness with hypotension: Yes Has patient had a PCN reaction causing severe rash involving mucus membranes or skin necrosis: unknown Has patient had a PCN reaction that required hospitalization No Has patient had a PCN reaction occurring within the last 10 years: No If all of the above answers are "NO", then may proceed with Cephalosporin use.     Family History  Problem Relation Age of Onset  . Aneurysm Mother   . Liver disease Father   . Congestive Heart Failure Brother   . Heart disease Brother   . Colon cancer Neg Hx   . Esophageal cancer Neg Hx   . Pancreatic cancer Neg Hx   . Rectal cancer Neg Hx   . Stomach cancer Neg Hx      Prior to Admission medications   Medication Sig Start Date End Date Taking? Authorizing Provider  albuterol (PROVENTIL) (2.5 MG/3ML) 0.083% nebulizer solution Take 3 mLs (2.5 mg total) by nebulization every 4 (four) hours as needed for wheezing or shortness of breath. 06/21/13   Francine Graven, DO  ALPRAZolam Duanne Moron) 0.5 MG tablet Take 1 tablet (0.5 mg total) by mouth 2 (two) times daily as needed for anxiety or sleep. 05/05/16   Orson Eva, MD  apixaban (ELIQUIS) 5 MG TABS tablet Take 1 tablet (5 mg total) by mouth 2 (two) times daily. 05/05/16   Orson Eva, MD  budesonide-formoterol [facility]) 160-4.5 MCG/ACT inhaler Take 2 puffs first thing in am and then another 2 puffs about 12 hours later. 04/29/16   Tanda Rockers, MD  cyclobenzaprine (FLEXERIL) 5 MG tablet Take 1 tablet (5 mg total) by mouth 3 (three) times daily. 05/05/16   Orson Eva, MD  diltiazem (CARDIZEM CD) 180 MG 24 hr capsule Take 1 capsule (180 mg total) by  mouth daily. 05/08/16   Orson Eva, MD  diphenhydrAMINE (BENADRYL) 25 mg capsule Take 1 capsule (25 mg total) by mouth every 4 (four) hours as needed (rash). 05/23/16   Fransico Meadow, PA-C  feeding supplement, ENSURE ENLIVE, (ENSURE ENLIVE) LIQD Take 237 mLs by mouth 2 (two) times daily between meals. 05/06/16   Orson Eva, MD  furosemide (LASIX) 40 MG tablet Take 1 tablet (40 mg total) by mouth every other day. 05/08/16   Orson Eva, MD  OXYGEN Inhale 2 L into the lungs continuous.    Historical Provider, MD  polyethylene glycol (MIRALAX / GLYCOLAX) packet Take 17 g by mouth daily.    Historical Provider, MD  sertraline (ZOLOFT) 25 MG tablet Take 25 mg by mouth daily. 04/05/16   Historical Provider, MD  temazepam (RESTORIL) 30 MG capsule Take 1 capsule (30 mg total) by mouth at bedtime. 05/05/16   Orson Eva, MD    Physical Exam: Vitals:   07/15/16 1700 07/15/16 1833 07/15/16 1845  07/15/16 1900  BP: (!) 118/52 122/68 119/77 132/77  Pulse: (!) 109 (!) 108 (!) 113 (!) 110  Resp: (!) 27 14 (!) 27 20  Temp:  98.5 F (36.9 C)    TempSrc:  Oral    SpO2: 92% 92% 93% 94%  Weight:      Height:          Constitutional: Mild tachypnea, accessory muscle use, no pallor or diaphoresis, non-toxic appearing Eyes: PERTLA, lids and conjunctivae normal ENMT: Mucous membranes are moist. Posterior pharynx clear of any exudate or lesions.   Neck: normal, supple, no masses, no thyromegaly Respiratory: Markedly diminished bilaterally. Increased WOB.  Cardiovascular: Rate ~100 and irregular. No extremity edema. No significant JVD. Abdomen: No distension, no tenderness, no masses palpated. Bowel sounds normal.  Musculoskeletal:  No joint deformity upper and lower extremities. Normal muscle tone.  Skin: no significant rashes, lesions, ulcers. Warm, dry, well-perfused. Neurologic: CN 2-12 grossly intact. Sensation intact, DTR normal. Strength 5/5 in all 4 limbs.  Psychiatric: Alert and oriented x 3. Normal mood and  affect.     Labs on Admission: I have personally reviewed following labs and imaging studies  CBC:  Recent Labs Lab 07/15/16 1330  WBC 20.9*  NEUTROABS 9.8*  HGB 13.4  HCT 43.7  MCV 93.2  PLT 458*   Basic Metabolic Panel:  Recent Labs Lab 07/15/16 1330  NA 140  K 3.9  CL 104  CO2 29  GLUCOSE 101*  BUN 19  CREATININE 0.74  CALCIUM 8.9   GFR: Estimated Creatinine Clearance: 62.2 mL/min (by C-G formula based on SCr of 0.74 mg/dL). Liver Function Tests:  Recent Labs Lab 07/15/16 1330  AST 40  ALT 49  ALKPHOS 68  BILITOT 0.6  PROT 5.6*  ALBUMIN 3.7   No results for input(s): LIPASE, AMYLASE in the last 168 hours. No results for input(s): AMMONIA in the last 168 hours. Coagulation Profile:  Recent Labs Lab 07/15/16 1330  INR 1.12   Cardiac Enzymes: No results for input(s): CKTOTAL, CKMB, CKMBINDEX, TROPONINI in the last 168 hours. BNP (last 3 results) No results for input(s): PROBNP in the last 8760 hours. HbA1C: No results for input(s): HGBA1C in the last 72 hours. CBG: No results for input(s): GLUCAP in the last 168 hours. Lipid Profile: No results for input(s): CHOL, HDL, LDLCALC, TRIG, CHOLHDL, LDLDIRECT in the last 72 hours. Thyroid Function Tests: No results for input(s): TSH, T4TOTAL, FREET4, T3FREE, THYROIDAB in the last 72 hours. Anemia Panel: No results for input(s): VITAMINB12, FOLATE, FERRITIN, TIBC, IRON, RETICCTPCT in the last 72 hours. Urine analysis:    Component Value Date/Time   COLORURINE STRAW (A) 09/24/2015 1946   APPEARANCEUR CLEAR (A) 09/24/2015 1946   LABSPEC 1.010 09/24/2015 1946   PHURINE 5.0 09/24/2015 1946   GLUCOSEU NEGATIVE 09/24/2015 1946   HGBUR 1+ (A) 09/24/2015 1946   BILIRUBINUR NEGATIVE 09/24/2015 1946   KETONESUR NEGATIVE 09/24/2015 1946   PROTEINUR NEGATIVE 09/24/2015 1946   UROBILINOGEN 0.2 01/06/2015 1843   NITRITE NEGATIVE 09/24/2015 1946   LEUKOCYTESUR NEGATIVE 09/24/2015 1946   Sepsis  Labs: '@LABRCNTIP'$ (procalcitonin:4,lacticidven:4) )No results found for this or any previous visit (from the past 240 hour(s)).   Radiological Exams on Admission: Dg Chest 2 View  Result Date: 07/15/2016 CLINICAL DATA:  Shortness breath, cough for 1 week EXAM: CHEST  2 VIEW COMPARISON:  05/07/2016 FINDINGS: There is bilateral chronic interstitial thickening. The lungs are hyperinflated likely secondary to COPD. There is no focal parenchymal opacity. There is  no pleural effusion or pneumothorax. The heart and mediastinal contours are unremarkable. The osseous structures are unremarkable. IMPRESSION: No active cardiopulmonary disease. Chronic interstitial disease. Electronically Signed   By: Kathreen Devoid   On: 07/15/2016 14:05   Ct Angio Chest Pe W And/or Wo Contrast  Result Date: 07/15/2016 CLINICAL DATA:  Leukemia and smoking history. Neck and back pain x1 week with dyspnea and nonproductive cough. EXAM: CT ANGIOGRAPHY CHEST WITH CONTRAST TECHNIQUE: Multidetector CT imaging of the chest was performed using the standard protocol during bolus administration of intravenous contrast. Multiplanar CT image reconstructions and MIPs were obtained to evaluate the vascular anatomy. CONTRAST:  100 cc Isovue 370 IV COMPARISON:  03/27/2016 FINDINGS: Cardiovascular: Normal size cardiac chambers without pericardial effusion or thickening. No aortic aneurysm or dissection. No acute pulmonary embolus. Aortic atherosclerosis. Mediastinum/Nodes: 4 mm left thyroid calcification. Confluent mediastinal and left greater than right hilar soft tissue abnormalities consistent with adenopathy possibly associated with the patient's history of leukemia. Representative left paratracheal soft tissue confluence measures 2.6 cm in thickness with confluent left hilar soft tissue masses measuring 4.6 x 2.5 cm, series 401, image 44. The soft tissue densities narrow the left main pulmonary artery and branches to the lingula and left upper lobe  with narrowing but not occlusion. Circumferential narrowing of the proximal pulmonary arteries to the left lower lobe are identified about the left hilum. Lungs/Pleura: Minimal apical pleuroparenchymal scarring at the right lung apex. 4 mm left lower lobe pulmonary nodule considered present and stable. Mild centrilobular emphysema. Opaque atelectatic appearance of the left upper lobe and lingula secondary to presumed postobstructive change from mediastinal and hilar soft tissue masses consistent with adenopathy. There is extrinsic occlusion of the bronchi to the left upper lobe, lingula and proximal left lower lobe. Trace bilateral pleural effusions with dependent atelectasis, left greater than right. Faint ground-glass opacities in the right lower lobe with areas of adjacent atelectasis and/or scarring. Small pulmonary nodular densities may be obscured by these opacities. There is mild bronchiectasis to the left lower lobe. Upper Abdomen: Stable 2.9 cm right adrenal nodule with hypo attenuation similar to prior. Negative left adrenal gland abnormality. The visualized kidneys, spleen, pancreas, stomach and liver demonstrate no acute findings. Musculoskeletal: No acute nor suspicious osseous lesions. There is mild dextroconvex curvature of the thoracic spine. Review of the MIP images confirms the above findings. IMPRESSION: 1. Postobstructive atelectasis involving the lingula and left upper lobe secondary to mediastinal and left hilar masslike abnormalities presumably representing adenopathy in this patient with history of leukemia. Confluent soft tissue masses have increased in appearance and size within the mediastinum and left hilum since prior causing postobstructive change of the left upper lobe, lingular and proximal left lower lobe bronchi. 2. Small left greater than right pleural effusions with atelectatic change. Centrilobular emphysema is stable. 3. Stable hypodense nodule of the right adrenal gland  consistent with an adenoma. 4. Centrilobular emphysema. 5. Aortic atherosclerosis without aneurysm. 6. No acute central pulmonary embolus. Electronically Signed   By: Ashley Royalty M.D.   On: 07/15/2016 18:22    EKG: Independently reviewed. Atrial fibrillation.   Assessment/Plan  1. COPD with acute exacerbation  - Pt presents with 1 wk of progressive dyspnea and cough, found to be hypoxic with labored breathing  - CXR negative for acute process, notable for chronic interstitial disease  - CTA negative for PE, edema, or focal consolidation, but notable for increased postobstructive atelectasis secondary to increased hilar adenopathy felt to be related to CLL  -  She was treated en route to ED with 125 mg IV Solu-Medrol, nebs, and 2 g IV magnesium - Given additional nebs in ED, has improved some, but continues to in distress at rest - Continue systemic steroid, nebs, and ICS/LABA; check sputum culture and start azithromycin    2. Obstructive atelectasis  - Noted on CTA chest, secondary to likely hilar adenopathy, felt to be related to her CLL  - Also seen on prior study, but appears to have increased  - Breathing treatments as above; incentive spirometry    3. Chronic atrial fibrillation  - In rate-controlled a fib on admission  - CHADS-VASc is 76 (age, gender, CVA x2)  - Continue Eliquis    4. CLL  - WBC is 20,900 on admission with smudge cells noted  - She is followed by oncology, Dr. Benay Spice, but has not required any treatment  - Progressive adenopathy noted on CTA as above     DVT prophylaxis: Eliquis  Code Status: Full  Family Communication: Discussed with patient Disposition Plan: Admit to telemetry Consults called: None Admission status: Inpatient    Vianne Bulls, MD [facility] Pager [phone]  If 7PM-7AM, please contact night-coverage [email or web address] Password [email or web address]  07/15/2016, 8:21 PM

## 2016-07-15 NOTE — ED Notes (Signed)
Patient transported to X-ray 

## 2016-07-16 DIAGNOSIS — D72829 Elevated white blood cell count, unspecified: Secondary | ICD-10-CM | POA: Diagnosis present

## 2016-07-16 DIAGNOSIS — J189 Pneumonia, unspecified organism: Secondary | ICD-10-CM | POA: Diagnosis present

## 2016-07-16 DIAGNOSIS — J9621 Acute and chronic respiratory failure with hypoxia: Secondary | ICD-10-CM

## 2016-07-16 DIAGNOSIS — A419 Sepsis, unspecified organism: Principal | ICD-10-CM

## 2016-07-16 LAB — BASIC METABOLIC PANEL
Anion gap: 7 (ref 5–15)
BUN: 18 mg/dL (ref 6–20)
CO2: 28 mmol/L (ref 22–32)
CREATININE: 0.66 mg/dL (ref 0.44–1.00)
Calcium: 8.7 mg/dL — ABNORMAL LOW (ref 8.9–10.3)
Chloride: 104 mmol/L (ref 101–111)
GFR calc Af Amer: >60 mL/min (ref >60)
GLUCOSE: 113 mg/dL — AB (ref 65–99)
POTASSIUM: 4.6 mmol/L (ref 3.5–5.1)
SODIUM: 139 mmol/L (ref 135–145)

## 2016-07-16 LAB — GLUCOSE, CAPILLARY: Glucose-Capillary: 121 mg/dL — ABNORMAL HIGH (ref 65–99)

## 2016-07-16 LAB — PATHOLOGIST SMEAR REVIEW

## 2016-07-16 MED ORDER — IPRATROPIUM-ALBUTEROL 0.5-2.5 (3) MG/3ML IN SOLN
3.0000 mL | Freq: Three times a day (TID) | RESPIRATORY_TRACT | Status: DC
  Administered 2016-07-16 – 2016-07-22 (×20): 3 mL via RESPIRATORY_TRACT
  Filled 2016-07-16 (×21): qty 3

## 2016-07-16 MED ORDER — METHYLPREDNISOLONE SODIUM SUCC 125 MG IJ SOLR
60.0000 mg | Freq: Two times a day (BID) | INTRAMUSCULAR | Status: DC
  Administered 2016-07-16 – 2016-07-17 (×3): 60 mg via INTRAVENOUS
  Filled 2016-07-16 (×3): qty 2

## 2016-07-16 MED ORDER — DEXTROSE 5 % IV SOLN
1.0000 g | INTRAVENOUS | Status: DC
  Administered 2016-07-16 – 2016-07-22 (×7): 1 g via INTRAVENOUS
  Filled 2016-07-16 (×8): qty 10

## 2016-07-16 NOTE — Progress Notes (Signed)
Initial Nutrition Assessment  DOCUMENTATION CODES:   Not applicable  INTERVENTION:   -Continue Ensure Enlive po BID, each supplement provides 350 kcal and 20 grams of protein  NUTRITION DIAGNOSIS:   Increased nutrient needs related to chronic illness (COPD) as evidenced by estimated needs.  GOAL:   Patient will meet greater than or equal to 90% of their needs  MONITOR:   PO intake, Supplement acceptance, Labs, Weight trends, Skin, I & O's  REASON FOR ASSESSMENT:   Consult COPD Protocol  ASSESSMENT:   Sheri Ayala is a 66 y.o. female with medical history significant for COPD with chronic 2 L/m some low oxygen requirement, chronic lymphocytic leukemia, atrial fibrillation on Eliquis, history of CVA, anxiety, and insomnia who presents to the emergency department with 1 week of progressive exertional dyspnea and cough.   Pt admitted with COPD exacerbation.   Spoke with RN, who reports she is eating great and typically consumes 1 Ensure daily, however, refused AM dose due to just finishing breakfast.   Pt reports feeling great. She reports that she typically has a fair to good appetite and consumes 3 meals per day (Breakfast: toast and jelly, lunch: soup and sandwich, Dinner: meat, starch, and vegetable). Pt also consumes 2 Ensure supplements daily, prefers strawberry flavor.   Pt reports that she has been in the hospital monthly related to acute illness. She shares that her weight typically fluctuates during acute illnesses and became concerned when her weight went down to 123# last year. She adds that she has been diligent about ensure proper nutrition and wt has been stable around 130# over the past several months, which is consistent with wt hx.   Nutrition-Focused physical exam completed. Findings are no fat depletion, no muscle depletion, and no edema.   Discussed importance of good meal and supplement intake to promote healing.   Labs reviewed: CBGS: 121.   Diet Order:   Diet Heart Room service appropriate? Yes; Fluid consistency: Thin  Skin:  Reviewed, no issues  Last BM:  07/15/16  Height:   Ht Readings from Last 1 Encounters:  07/15/16 '5\' 9"'$  (1.753 m)    Weight:   Wt Readings from Last 1 Encounters:  07/16/16 131 lb 11.2 oz (59.7 kg)    Ideal Body Weight:  65.9 kg  BMI:  Body mass index is 19.45 kg/m.  Estimated Nutritional Needs:   Kcal:  1700-1900  Protein:  85-100  Fluid:  1.7-1.9 L  EDUCATION NEEDS:   Education needs addressed  Sandar Krinke A. Jimmye Norman, RD, LDN, CDE Pager: [phone] After hours Pager: [phone]

## 2016-07-16 NOTE — Progress Notes (Addendum)
Patient ID: Sheri Ayala, female   DOB: [birth year], 66 y.o.   MRN: [id number]  PROGRESS NOTE    Sheri Ayala  UGQ:[id number] DOB: Jul 16, 1950 DOA: 07/15/2016  PCP: Sheri Chou, NP   Brief Narrative:  66 y.o. female with medical history significant for COPD with chronic 2 L/m oxygen requirement, chronic lymphocytic leukemia (follows with Dr. Benay Spice), atrial fibrillation but eliquis was stopped as pt had low platelets and nose bleeds, history of CVA, anxiety. She presented to ED with 1 week of progressive exertional dyspnea and cough. The symptoms progressively worsened despite her use of home inhalers. No fevers. No reports of being exposed to sick contacts.   In ED, she was tachycardic, tachypnea and hypoxic with O2 sat in mid 80's but this has improved with oxygen support. She was given solumedrol 125 mg IV and nebs but continued to be short of breath. Her blood work showed WBC count 20.9, lactic acid as high as 2.15. CXR showed no active cardiopulmonary disease, chronic interstitial disease. Ct angio chest showed no PE but it did show postobstructive atelectasis involving the lingula and left upper lobe secondary to mediastinal and left hilar masslike abnormalities presumably representing adenopathy in this patient with history of leukemia. Confluent soft tissue masses have increased in appearance and size within the mediastinum and left hilum since prior study causing postobstructive change of the left upper lobe, lingular and proximal left lower lobe bronchi.  Pt was started on azithromycin and scheduled nebs and was admitted for COPD and pneumonia management.  Assessment & Plan:   Principal Problem: Acute on chronic respiratory failure with hypoxia (HCC) / COPD with acute exacerbation (HCC) - Continue current nebulizer treatments  - Continue solumedrol but decrease frequency to Q 12 hours from Q 6 hours - Continue oxygen support via [place] to keep O2 saturation above 90% - CXR independently  reviewed and showed no acute findings. CT also reviewed independently and showed obstructive atelectasis and hilar adenopathy.  Active Problems: Sepsis due to pneumonia [facility]) / Community acquired pneumonia / Obstructive atelectasis / Leukocytosis  - Sepsis criteria met on admission with tachycardia, tachypnea, hypoxia, lactic acidosis, leukocytosis. Source of infection is likely pneumonia from obstructive atelectasis in the setting of hilar adenopathy - Pt was on azithromycin already but will rocephin this am - Blood cultures not obtained on admission  - Follow up clinically, would not obtain BCx now unless pt spikes fever  - Repeat CBC and lactic acid in am  CLL (chronic lymphocytic leukemia) (HCC) - Follows with Dr. Benay Spice of oncology   Chronic atrial fibrillation  - CHADS-VASc is 56 (age, gender, CVA x2)  - Pt not on Eliquis, stopped due to low platelets and nose bleeds - Rate controlled with Cardizem 180 mg daily   Depression - Continue Zoloft   DVT prophylaxis: will add SCD's for DVT prophylaxis  Code Status: full code  Family Communication: no family at the bedside this am Disposition Plan: home once resp status better    Consultants:   None   Procedures:   None  Antimicrobials:   Azithromycin 07/15/2016 -->  Rocephin 4.2.2018 -->   Subjective: No overnight events. Short of breath with exertion.  Objective: Vitals:   07/15/16 2115 07/15/16 2345 07/16/16 0541 07/16/16 0859  BP: 124/84 136/81 113/76   Pulse: (!) 105 87 91   Resp: _0 Temp: 99 F (37.2 C) 98.4 F (36.9 C) 98.4 F (36.9 C)   TempSrc: Oral Oral Oral  SpO2: 94% 93% 93% 90%  Weight: 59.1 kg (130 lb 6.4 oz)  59.7 kg (131 lb 11.2 oz)   Height: _0  (1.753 m)       Intake/Output Summary (Last 24 hours) at 07/16/16 0944 Last data filed at 07/16/16 0906  Gross per 24 hour  Intake              820 ml  Output              300 ml  Net              520 ml   Filed Weights   07/15/16  1237 07/15/16 2115 07/16/16 0541  Weight: 57.6 kg (127 lb) 59.1 kg (130 lb 6.4 oz) 59.7 kg (131 lb 11.2 oz)    Examination:  General exam: Appears calm and comfortable  Respiratory system: Rhonchorous, mild wheezing in upper lung lobes  Cardiovascular system: S1 & S2 heard, RRR. No JVD Gastrointestinal system: Abdomen is nondistended, soft and nontender. No organomegaly or masses felt. Normal bowel sounds heard. Central nervous system: Alert and oriented. No focal neurological deficits. Extremities: Symmetric 5 x 5 power. Skin: No rashes, lesions or ulcers Psychiatry: Judgement and insight appear normal. Mood & affect appropriate.   Data Reviewed: I have personally reviewed following labs and imaging studies  CBC:  Recent Labs Lab 07/15/16 1330  WBC 20.9*  NEUTROABS 9.8*  HGB 13.4  HCT 43.7  MCV 93.2  PLT 518*   Basic Metabolic Panel:  Recent Labs Lab 07/15/16 1330 07/16/16 0517  NA 140 139  K 3.9 4.6  CL 104 104  CO2 29 28  GLUCOSE 101* 113*  BUN 19 18  CREATININE 0.74 0.66  CALCIUM 8.9 8.7*   GFR: Estimated Creatinine Clearance: 65.2 mL/min (by C-G formula based on SCr of 0.66 mg/dL). Liver Function Tests:  Recent Labs Lab 07/15/16 1330  AST 40  ALT 49  ALKPHOS 68  BILITOT 0.6  PROT 5.6*  ALBUMIN 3.7   No results for input(s): LIPASE, AMYLASE in the last 168 hours. No results for input(s): AMMONIA in the last 168 hours. Coagulation Profile:  Recent Labs Lab 07/15/16 1330  INR 1.12   Cardiac Enzymes: No results for input(s): CKTOTAL, CKMB, CKMBINDEX, TROPONINI in the last 168 hours. BNP (last 3 results) No results for input(s): PROBNP in the last 8760 hours. HbA1C: No results for input(s): HGBA1C in the last 72 hours. CBG:  Recent Labs Lab 07/16/16 0535  GLUCAP 121*   Lipid Profile: No results for input(s): CHOL, HDL, LDLCALC, TRIG, CHOLHDL, LDLDIRECT in the last 72 hours. Thyroid Function Tests: No results for input(s): TSH,  T4TOTAL, FREET4, T3FREE, THYROIDAB in the last 72 hours. Anemia Panel: No results for input(s): VITAMINB12, FOLATE, FERRITIN, TIBC, IRON, RETICCTPCT in the last 72 hours. Urine analysis:    Component Value Date/Time   COLORURINE STRAW (A) 09/24/2015 1946   APPEARANCEUR CLEAR (A) 09/24/2015 1946   LABSPEC 1.010 09/24/2015 1946   PHURINE 5.0 09/24/2015 1946   GLUCOSEU NEGATIVE 09/24/2015 1946   HGBUR 1+ (A) 09/24/2015 1946   BILIRUBINUR NEGATIVE 09/24/2015 1946   KETONESUR NEGATIVE 09/24/2015 1946   PROTEINUR NEGATIVE 09/24/2015 1946   UROBILINOGEN 0.2 01/06/2015 1843   NITRITE NEGATIVE 09/24/2015 1946   LEUKOCYTESUR NEGATIVE 09/24/2015 1946   Sepsis Labs: _1 (procalcitonin:4,lacticidven:4)   )No results found for this or any previous visit (from the past 240 hour(s)).    Radiology Studies: Dg Chest 2 View Result Date: 07/15/2016 No active  cardiopulmonary disease. Chronic interstitial disease.   Ct Angio Chest Pe W And/or Wo Contrast Result Date: 07/15/2016 1. Postobstructive atelectasis involving the lingula and left upper lobe secondary to mediastinal and left hilar masslike abnormalities presumably representing adenopathy in this patient with history of leukemia. Confluent soft tissue masses have increased in appearance and size within the mediastinum and left hilum since prior causing postobstructive change of the left upper lobe, lingular and proximal left lower lobe bronchi. 2. Small left greater than right pleural effusions with atelectatic change. Centrilobular emphysema is stable. 3. Stable hypodense nodule of the right adrenal gland consistent with an adenoma. 4. Centrilobular emphysema. 5. Aortic atherosclerosis without aneurysm. 6. No acute central pulmonary embolus.     Scheduled Meds: . azithromycin  500 mg Intravenous Q24H  . cefTRIAXone (ROCEPHIN)  IV  1 g Intravenous Q24H  . cyclobenzaprine  5 mg Oral TID  . diltiazem  180 mg Oral Daily  . feeding  supplement (ENSURE ENLIVE)  237 mL Oral BID BM  . ipratropium-albutero  3 mL Nebulization TID  . methylPREDNISolone (SOLU-MEDROL) injection  60 mg Intravenous Q12H  . mometasone-formoterol  2 puff Inhalation BID  . polyethylene glycol  17 g Oral Daily  . sertraline  25 mg Oral Daily  . temazepam  30 mg Oral QHS   Continuous Infusions:   LOS: 1 day    Time spent: 25 minutes  Greater than 50% of the time spent on counseling and coordinating the care.   Leisa Lenz, MD [facility] Pager [phone]  If 7PM-7AM, please contact night-coverage [email or web address] Password [facility] 07/16/2016, 9:44 AM

## 2016-07-16 NOTE — Progress Notes (Signed)
No complaints 7am-7pm.   Sheri Ayala

## 2016-07-16 NOTE — Evaluation (Signed)
Physical Therapy Evaluation Patient Details Name: Sheri Ayala MRN: [id number] DOB: 11-Oct-1950 Today's Date: 07/16/2016   History of Present Illness  Venisa I Difranco is a 66 y.o. female with PMH significant for COPD (on chronic oxygen supplementation, CLL, tobacco abuse , depression, anxiety, left lung mass and hx of CVA (w/o residual deficit); presented with worsening SOB. Pt found to have PNA and left lung masses. Pt with COPD exacerbation.  Clinical Impression  Pt presents with decreased activity tolerance and balance secondary to above. Pt educated on pursed lip breathing and energy conservation techniques to improve activity tolerance. Recommend d/c home with supervision for safe transition home when medically ready. Acute PT will follow.     Follow Up Recommendations No PT follow up;Supervision/Assistance - 24 hour    Equipment Recommendations  None recommended by PT    Recommendations for Other Services       Precautions / Restrictions Precautions Precautions: Fall Restrictions Weight Bearing Restrictions: No      Mobility  Bed Mobility Overal bed mobility: Modified Independent             General bed mobility comments: increased time and effort   Transfers Overall transfer level: Needs assistance Equipment used: None Transfers: Sit to/from Stand Sit to Stand: Supervision         General transfer comment: supervision for safety. increased time   Ambulation/Gait Ambulation/Gait assistance: Supervision Ambulation Distance (Feet): 150 Feet Assistive device: None Gait Pattern/deviations: Step-through pattern;Decreased stride length Gait velocity: decreased Gait velocity interpretation: Below normal speed for age/gender General Gait Details: supervision for safety. DOE 2/4 with SpO2 down to 88% at cessation of amb and recovered to 90% in 30 seconds with verbal cues for pursed lip breathing technique.   Stairs Stairs: Yes Stairs assistance: Min guard Stair  Management: One rail Right;Step to pattern;Forwards Number of Stairs: 2 General stair comments: min guard for safety. use of 1 handrail with step to pattern. increased time and effort  Wheelchair Mobility    Modified Rankin (Stroke Patients Only)       Balance Overall balance assessment: Needs assistance Sitting-balance support: Feet supported;No upper extremity supported Sitting balance-Leahy Scale: Good Sitting balance - Comments: sat EOB and performed exercises x 3 min   Standing balance support: No upper extremity supported Standing balance-Leahy Scale: Good Standing balance comment: able to stand and march x 20 without LOB                             Pertinent Vitals/Pain Pain Assessment: No/denies pain    Home Living Family/patient expects to be discharged to:: Private residence Living Arrangements: Alone Available Help at Discharge: Neighbor;Available 24 hours/day Type of Home: Mobile home Home Access: Stairs to enter Entrance Stairs-Rails: Can reach both Entrance Stairs-Number of Steps: 2 Home Layout: One level Home Equipment: Cane - single point;Grab bars - tub/shower Additional Comments: neighbor available to assist 24/7    Prior Function Level of Independence: Independent         Comments: intermittent use of SPC with severe SOB. son does driving and grocery shopping      Hand Dominance   Dominant Hand: Right    Extremity/Trunk Assessment   Upper Extremity Assessment Upper Extremity Assessment: Overall WFL for tasks assessed    Lower Extremity Assessment Lower Extremity Assessment: Generalized weakness    Cervical / Trunk Assessment Cervical / Trunk Assessment: Kyphotic  Communication   Communication: No difficulties  Cognition Arousal/Alertness:  Awake/alert Behavior During Therapy: WFL for tasks assessed/performed Overall Cognitive Status: Within Functional Limits for tasks assessed                                         General Comments      Exercises General Exercises - Lower Extremity Ankle Circles/Pumps: AROM;Both;20 reps;Seated Long Arc Quad: AROM;Both;10 reps;Seated Hip Flexion/Marching: AROM;Both;10 reps;Standing   Assessment/Plan    PT Assessment Patient needs continued PT services  PT Problem List Decreased strength;Decreased activity tolerance;Decreased balance;Decreased mobility       PT Treatment Interventions Gait training;Stair training;Functional mobility training;Therapeutic activities;Therapeutic exercise;Balance training;Neuromuscular re-education;Patient/family education    PT Goals (Current goals can be found in the Care Plan section)  Acute Rehab PT Goals Patient Stated Goal: get better and go home PT Goal Formulation: With patient Time For Goal Achievement: 07/30/16 Potential to Achieve Goals: Good Additional Goals Additional Goal #1: Pt will verbalize understanding and demonstrate energy conservation techniques for improved activity tolerance.     Frequency Min 3X/week   Barriers to discharge        Co-evaluation               End of Session Equipment Utilized During Treatment: Gait belt Activity Tolerance: Patient tolerated treatment well Patient left: in chair;with call bell/phone within reach Nurse Communication: Mobility status;Other (comment) (pt need for recliner) PT Visit Diagnosis: Muscle weakness (generalized) (M62.81);Difficulty in walking, not elsewhere classified (R26.2)    Time: 6948-5462 PT Time Calculation (min) (ACUTE ONLY): 28 min   Charges:   PT Evaluation $PT Eval Low Complexity: 1 Procedure PT Treatments $Gait Training: 8-22 mins   PT G Codes:        Tracie Harrier, SPT Acute Rehab SPT [phone]    Tracie Harrier 07/16/2016, 9:15 AM

## 2016-07-16 NOTE — Evaluation (Signed)
Occupational Therapy Evaluation Patient Details Name: Sheri Ayala MRN: [id number] DOB: July 02, 1950 Today's Date: 07/16/2016    History of Present Illness Sheri Ayala is a 66 y.o. female with PMH significant for COPD (on chronic oxygen supplementation, CLL, tobacco abuse , depression, anxiety, left lung mass and hx of CVA (w/o residual deficit); presented with worsening SOB. Pt found to have PNA and left lung masses. Pt with COPD exacerbation.   Clinical Impression   PTA, pt was living alone and was independent in ADLs and IADLs. Currently, pt demonstrates near baseline function with Mod I for ADLs and functional mobility benefiting from increased time. Provided pt with education and handout on energy conservation to increase pt independence at home; pt verbalized understanding. Provided appropiate education and answered questions. Recommend dc to home when medically stable per physician. Will sign of OT. Thank you.      Follow Up Recommendations  No OT follow up;Supervision/Assistance - 24 hour    Equipment Recommendations  None recommended by OT    Recommendations for Other Services       Precautions / Restrictions Precautions Precautions: Fall Restrictions Weight Bearing Restrictions: No      Mobility Bed Mobility Overal bed mobility: Modified Independent             General bed mobility comments: increased time and effort   Transfers Overall transfer level: Needs assistance Equipment used: None Transfers: Sit to/from Stand Sit to Stand: Supervision         General transfer comment: supervision for safety    Balance Overall balance assessment: Needs assistance Sitting-balance support: Feet supported;No upper extremity supported Sitting balance-Leahy Scale: Good Sitting balance - Comments: sat EOB and performed exercises x 3 min   Standing balance support: During functional activity;No upper extremity supported Standing balance-Leahy Scale: Good Standing  balance comment: Able to perform fucntional mobility and transfers with supervision                           ADL either performed or assessed with clinical judgement   ADL Overall ADL's : Modified independent;At baseline                                       General ADL Comments: Pt demonstrates near baseline function with need for some additional time for functional mobility. Provided handout on energy conservation during ADLs and IADLs     Vision         Perception     Praxis      Pertinent Vitals/Pain Pain Assessment: Faces Faces Pain Scale: No hurt Pain Intervention(s): Monitored during session     Hand Dominance Right   Extremity/Trunk Assessment Upper Extremity Assessment Upper Extremity Assessment: Overall WFL for tasks assessed   Lower Extremity Assessment Lower Extremity Assessment: Defer to PT evaluation   Cervical / Trunk Assessment Cervical / Trunk Assessment: Kyphotic   Communication Communication Communication: No difficulties   Cognition Arousal/Alertness: Awake/alert Behavior During Therapy: WFL for tasks assessed/performed Overall Cognitive Status: Within Functional Limits for tasks assessed                                     General Comments  Pt reports no dizziness or SOB. Pt showed no signs of dyspnea    Exercises Exercises: General  Lower Extremity (given HEP) General Exercises - Lower Extremity Ankle Circles/Pumps: AROM;Both;20 reps;Seated Long Arc Quad: AROM;Both;10 reps;Seated Hip Flexion/Marching: AROM;Both;10 reps;Standing   Shoulder Instructions      Home Living Family/patient expects to be discharged to:: Private residence Living Arrangements: Alone Available Help at Discharge: Neighbor;Available 24 hours/day Type of Home: Mobile home Home Access: Stairs to enter Entrance Stairs-Number of Steps: 2 Entrance Stairs-Rails: Can reach both Home Layout: One level     Bathroom  Shower/Tub: Teacher, early years/pre: Standard     Home Equipment: Cane - single point;Grab bars - tub/shower;Hand held shower head;Adaptive equipment Adaptive Equipment: Reacher Additional Comments: home O2 2L      Prior Functioning/Environment Level of Independence: Independent        Comments: ADLs, IADLs, driving        OT Problem List: Decreased strength;Decreased activity tolerance;Impaired balance (sitting and/or standing)      OT Treatment/Interventions:      OT Goals(Current goals can be found in the care plan section) Acute Rehab OT Goals Patient Stated Goal: get better and go home OT Goal Formulation: With patient Time For Goal Achievement: 07/30/16 Potential to Achieve Goals: Good  OT Frequency:     Barriers to D/C:            Co-evaluation              End of Session Nurse Communication: Mobility status  Activity Tolerance: Patient tolerated treatment well Patient left: in bed  OT Visit Diagnosis: Unsteadiness on feet (R26.81);Muscle weakness (generalized) (M62.81)                Time: 5909-3112 OT Time Calculation (min): 13 min Charges:  OT General Charges $OT Visit: 1 Procedure OT Evaluation $OT Eval Low Complexity: 1 Procedure G-Codes:     Murle Hellstrom, OTR/L [phone]  [place] 07/16/2016, 9:48 AM

## 2016-07-16 NOTE — Progress Notes (Signed)
Patient arrived from [facility] to 19E07. Alert and oriented X 4. No complaints of pain. SR on telemetry. No skin breakdown. Admit with SOB/Copd exh. Moderate fall risk. Patient has had multiple admits since first of year. Clinical pathway initiated. Patient oriented to room and call system.

## 2016-07-17 DIAGNOSIS — E875 Hyperkalemia: Secondary | ICD-10-CM

## 2016-07-17 LAB — BASIC METABOLIC PANEL
Anion gap: 8 (ref 5–15)
BUN: 32 mg/dL — AB (ref 6–20)
CALCIUM: 8.7 mg/dL — AB (ref 8.9–10.3)
CHLORIDE: 99 mmol/L — AB (ref 101–111)
CO2: 26 mmol/L (ref 22–32)
Creatinine, Ser: 0.75 mg/dL (ref 0.44–1.00)
GFR calc non Af Amer: >60 mL/min (ref >60)
Glucose, Bld: 134 mg/dL — ABNORMAL HIGH (ref 65–99)
Potassium: 5.6 mmol/L — ABNORMAL HIGH (ref 3.5–5.1)
Sodium: 133 mmol/L — ABNORMAL LOW (ref 135–145)

## 2016-07-17 LAB — CBC
HEMATOCRIT: 38.3 % (ref 36.0–46.0)
Hemoglobin: 11.9 g/dL — ABNORMAL LOW (ref 12.0–15.0)
MCH: 29 pg (ref 26.0–34.0)
MCHC: 31.1 g/dL (ref 30.0–36.0)
MCV: 93.2 fL (ref 78.0–100.0)
Platelets: 140 10*3/uL — ABNORMAL LOW (ref 150–400)
RBC: 4.11 MIL/uL (ref 3.87–5.11)
RDW: 15.6 % — AB (ref 11.5–15.5)
WBC: 23.7 10*3/uL — ABNORMAL HIGH (ref 4.0–10.5)

## 2016-07-17 LAB — GLUCOSE, CAPILLARY: GLUCOSE-CAPILLARY: 134 mg/dL — AB (ref 65–99)

## 2016-07-17 LAB — LACTIC ACID, PLASMA: Lactic Acid, Venous: 2.1 mmol/L (ref 0.5–1.9)

## 2016-07-17 MED ORDER — AZITHROMYCIN 500 MG PO TABS
500.0000 mg | ORAL_TABLET | Freq: Every day | ORAL | Status: AC
Start: 2016-07-17 — End: 2016-07-23
  Administered 2016-07-17 – 2016-07-23 (×6): 500 mg via ORAL
  Filled 2016-07-17 (×6): qty 1

## 2016-07-17 MED ORDER — SODIUM POLYSTYRENE SULFONATE 15 GM/60ML PO SUSP
15.0000 g | Freq: Once | ORAL | Status: AC
Start: 2016-07-17 — End: 2016-07-17
  Administered 2016-07-17: 15 g via ORAL
  Filled 2016-07-17: qty 60

## 2016-07-17 NOTE — Progress Notes (Addendum)
Patient ID: Sheri Ayala, female   DOB: 12/02/1950, 66 y.o.   MRN: [id number]  PROGRESS NOTE    Corvette I Vantol  ENI:[id number] DOB: 1951/01/11 DOA: 07/15/2016  PCP: Alvester Chou, NP   Brief Narrative:  66 y.o. female with medical history significant for COPD with chronic 2 L/m oxygen requirement, chronic lymphocytic leukemia (follows with Dr. Benay Spice), atrial fibrillation but eliquis was stopped as pt had low platelets and nose bleeds, history of CVA, anxiety. She presented to ED with 1 week of progressive exertional dyspnea and cough. The symptoms progressively worsened despite her use of home inhalers. No fevers. No reports of being exposed to sick contacts.   In ED, she was tachycardic, tachypnea and hypoxic with O2 sat in mid 80's but this has improved with oxygen support. She was given solumedrol 125 mg IV and nebs but continued to be short of breath. Her blood work showed WBC count 20.9, lactic acid as high as 2.15. CXR showed no active cardiopulmonary disease, chronic interstitial disease. Ct angio chest showed no PE but it did show postobstructive atelectasis involving the lingula and left upper lobe secondary to mediastinal and left hilar masslike abnormalities presumably representing adenopathy in this patient with history of leukemia. Confluent soft tissue masses have increased in appearance and size within the mediastinum and left hilum since prior study causing postobstructive change of the left upper lobe, lingular and proximal left lower lobe bronchi.  Pt was started on azithromycin and scheduled nebs and was admitted for COPD and pneumonia management.  Assessment & Plan:   Principal Problem: Acute on chronic respiratory failure with hypoxia (HCC) / COPD with acute exacerbation ([place]) - CXR independently reviewed and showed no acute findings. CT also reviewed independently and showed obstructive atelectasis and hilar adenopathy. - Still hypoxic on [place] so will need to continue to monitor her  oxygenation for at least next 24 hours - Continue oxygen support via [place] to keep O2 sats above 90% - Continue current nebulizer treatments: Duoneb TID scheduled and every 2 hours PRN shortness of breath or wheezing  - Continue solumedrol Q 12 hours  Active Problems: Sepsis due to pneumonia ([place]) / Community acquired pneumonia / Obstructive atelectasis / Leukocytosis  - Sepsis criteria met on admission with tachycardia, tachypnea, hypoxia, lactic acidosis, leukocytosis. Source of infection is likely pneumonia from obstructive atelectasis in the setting of hilar adenopathy - Pt was on azithromycin on admission, rocephin added 07/16/16 - Blood cultures not obtained on admission  - Follow up clinically, would not obtain BCx now unless pt spikes fever  - She has persistent leukocytosis which is likely from steroids and CLL - Her lactic acid is still elevated at 2.1  Hyperkalemia - Will give low dose kayexalate - Repeat potassium in am  CLL (chronic lymphocytic leukemia) ([place]) - Follows with Dr. Benay Spice of oncology  - I sent message through EPIC to Dr. Benay Spice that pt is hospitalized, oncology consultation not required at this time   Chronic atrial fibrillation  - CHADS-VASc is 64 (age, gender, CVA x2)  - Pt not on Eliquis, stopped due to low platelets and nose bleeds - Rate controlled with Cardizem 180 mg daily   Depression - Continue Zoloft   DVT prophylaxis: SCD's for DVT prophylaxis  Code Status: full code  Family Communication: no family at the bedside this am Disposition Plan: home once resp status better; once sepsis etiology resolves    Consultants:   PT  Nutrition  Procedures:   None  Antimicrobials:   Azithromycin 07/15/2016 -->  Rocephin 07/16/2016 -->   Subjective: Hypoxic with exertion.  Objective: Vitals:   07/16/16 1600 07/16/16 2058 07/17/16 0534 07/17/16 0735  BP: (!) 122/54  (!) 108/57   Pulse: 74  78   Resp:   18   Temp:   97.8 F (36.6 C)     TempSrc:   Oral   SpO2:  96% 93% (!) 89%  Weight:   61.7 kg (136 lb)   Height:        Intake/Output Summary (Last 24 hours) at 07/17/16 0915 Last data filed at 07/17/16 0400  Gross per 24 hour  Intake             1273 ml  Output              300 ml  Net              973 ml   Filed Weights   07/15/16 2115 07/16/16 0541 07/17/16 0534  Weight: 59.1 kg (130 lb 6.4 oz) 59.7 kg (131 lb 11.2 oz) 61.7 kg (136 lb)    Examination:  General exam: Appears calm and comfortable, mild distress if walking to the bathroom  Respiratory system: Rhonchorous, mild wheezing in upper lung lobes  Cardiovascular system: S1 & S2 heard, rate controlled  Gastrointestinal system: (+) BS, non tender  Central nervous system: No focal neurological deficits. Extremities: No swelling, palpable pulses  Skin: No rashes, lesions or ulcers, skin is  Warm and dry  Psychiatry: Normal mood and behavior   Data Reviewed: I have personally reviewed following labs and imaging studies  CBC:  Recent Labs Lab 07/15/16 1330 07/17/16 0432  WBC 20.9* 23.7*  NEUTROABS 9.8*  --   HGB 13.4 11.9*  HCT 43.7 38.3  MCV 93.2 93.2  PLT 133* 773*   Basic Metabolic Panel:  Recent Labs Lab 07/15/16 1330 07/16/16 0517 07/17/16 0432  NA 140 139 133*  K 3.9 4.6 5.6*  CL 104 104 99*  CO2 _0 GLUCOSE 101* 113* 134*  BUN 19 18 32*  CREATININE 0.74 0.66 0.75  CALCIUM 8.9 8.7* 8.7*   GFR: Estimated Creatinine Clearance: 67.4 mL/min (by C-G formula based on SCr of 0.75 mg/dL). Liver Function Tests:  Recent Labs Lab 07/15/16 1330  AST 40  ALT 49  ALKPHOS 68  BILITOT 0.6  PROT 5.6*  ALBUMIN 3.7   No results for input(s): LIPASE, AMYLASE in the last 168 hours. No results for input(s): AMMONIA in the last 168 hours. Coagulation Profile:  Recent Labs Lab 07/15/16 1330  INR 1.12   Cardiac Enzymes: No results for input(s): CKTOTAL, CKMB, CKMBINDEX, TROPONINI in the last 168 hours. BNP (last 3  results) No results for input(s): PROBNP in the last 8760 hours. HbA1C: No results for input(s): HGBA1C in the last 72 hours. CBG:  Recent Labs Lab 07/16/16 0535 07/17/16 0652  GLUCAP 121* 134*   Lipid Profile: No results for input(s): CHOL, HDL, LDLCALC, TRIG, CHOLHDL, LDLDIRECT in the last 72 hours. Thyroid Function Tests: No results for input(s): TSH, T4TOTAL, FREET4, T3FREE, THYROIDAB in the last 72 hours. Anemia Panel: No results for input(s): VITAMINB12, FOLATE, FERRITIN, TIBC, IRON, RETICCTPCT in the last 72 hours. Urine analysis:    Component Value Date/Time   COLORURINE STRAW (A) 09/24/2015 1946   APPEARANCEUR CLEAR (A) 09/24/2015 1946   LABSPEC 1.010 09/24/2015 1946   PHURINE 5.0 09/24/2015 1946   GLUCOSEU NEGATIVE 09/24/2015 1946   HGBUR  1+ (A) 09/24/2015 1946   BILIRUBINUR NEGATIVE 09/24/2015 1946   KETONESUR NEGATIVE 09/24/2015 1946   PROTEINUR NEGATIVE 09/24/2015 1946   UROBILINOGEN 0.2 01/06/2015 1843   NITRITE NEGATIVE 09/24/2015 1946   LEUKOCYTESUR NEGATIVE 09/24/2015 1946   Sepsis Labs: _0 (procalcitonin:4,lacticidven:4)   )No results found for this or any previous visit (from the past 240 hour(s)).    Radiology Studies: Dg Chest 2 View  07/15/2016 No active cardiopulmonary disease. Chronic interstitial disease.   Ct Angio Chest Pe W And/or Wo Contrast  07/15/2016 1. Postobstructive atelectasis involving the lingula and left upper lobe secondary to mediastinal and left hilar masslike abnormalities presumably representing adenopathy in this patient with history of leukemia. Confluent soft tissue masses have increased in appearance and size within the mediastinum and left hilum since prior causing postobstructive change of the left upper lobe, lingular and proximal left lower lobe bronchi. 2. Small left greater than right pleural effusions with atelectatic change. Centrilobular emphysema is stable. 3. Stable hypodense nodule of the right adrenal gland  consistent with an adenoma. 4. Centrilobular emphysema. 5. Aortic atherosclerosis without aneurysm. 6. No acute central pulmonary embolus.    Marland Kitchen azithromycin  500 mg Intravenous Q24H  . cefTRIAXone (ROCEPHIN)  IV  1 g Intravenous Q24H  . cyclobenzaprine  5 mg Oral TID  . diltiazem  180 mg Oral Daily  . feeding supplement (ENSURE ENLIVE)  237 mL Oral BID BM  . ipratropium-albuterol  3 mL Nebulization TID  . methylPREDNISolone (SOLU-MEDROL) injection  60 mg Intravenous Q12H  . mometasone-formoterol  2 puff Inhalation BID  . polyethylene glycol  17 g Oral Daily  . sertraline  25 mg Oral Daily  . sodium polystyrene  15 g Oral Once  . temazepam  30 mg Oral QHS     Continuous Infusions:   LOS: 2 days    Time spent: 25 minutes  Greater than 50% of the time spent on counseling and coordinating the care.   Leisa Lenz, MD [facility] Pager [phone]  If 7PM-7AM, please contact night-coverage [email or web address] Password [email or web address] 07/17/2016, 9:15 AM

## 2016-07-17 NOTE — Progress Notes (Signed)
Orders written on 07/16/16 @ 0957 to discontinue cardiac monitoring.

## 2016-07-17 NOTE — Care Management Note (Signed)
Case Management Note  Patient Details  Name: Laekyn I Lanigan MRN: [id number] Date of Birth: 11-25-1950  Subjective/Objective:      Admitted with Acute on Chronic Resp Failure ; hx chronic lymphocytic leukemia           Action/Plan: Patient lives at home; PCP: Alvester Chou, NP; has private insurance with [facility] Medicare with prescription drug coverage; has home oxygen; patient ambulating without any assistance; CM will continue to follow for DCP  Expected Discharge Date:  07/18/16               Expected Discharge Plan:  Home/Self Care  Discharge planning Services  CM Consult    Status of Service:  In process, will continue to follow:  Sherrilyn Rist [phone] 07/17/2016, 9:51 AM

## 2016-07-17 NOTE — Progress Notes (Signed)
Physical Therapy Discharge Patient Details Name: Sheri Ayala MRN: [id number] DOB: [birth year] Today's Date: 07/17/2016 Time: 9842-1031 PT Time Calculation (min) (ACUTE ONLY): 8 min  Patient discharged from PT services secondary to goals met and no further PT needs identified.  Please see latest therapy progress note for current level of functioning and progress toward goals.    Progress and discharge plan discussed with patient and/or caregiver: Patient/Caregiver agrees with plan  GP     Shary Decamp [facility] 07/17/2016, 3:03 PM  [facility] PT [phone]

## 2016-07-17 NOTE — Progress Notes (Signed)
qPhysical Therapy Treatment Patient Details Name: Sheri Ayala MRN: [id number] DOB: 1950/07/18 Today's Date: 07/17/2016    History of Present Illness Sheri Ayala is a 66 y.o. female with PMH significant for COPD (on chronic oxygen supplementation, CLL, tobacco abuse , depression, anxiety, left lung mass and hx of CVA (w/o residual deficit); presented with worsening SOB. Pt found to have PNA and left lung masses. Pt with COPD exacerbation.    PT Comments    Pt doing well with mobility and no further PT needed.      Follow Up Recommendations  No PT follow up;Supervision/Assistance - 24 hour     Equipment Recommendations  None recommended by PT    Recommendations for Other Services       Precautions / Restrictions Precautions Precautions: None Restrictions Weight Bearing Restrictions: No    Mobility  Bed Mobility                  Transfers Overall transfer level: Modified independent Equipment used: None Transfers: Sit to/from Stand Sit to Stand: Modified independent (Device/Increase time)            Ambulation/Gait Ambulation/Gait assistance: Modified independent (Device/Increase time) Ambulation Distance (Feet): 400 Feet Assistive device: None Gait Pattern/deviations: Step-through pattern;Decreased stride length   Gait velocity interpretation: at or above normal speed for age/gender General Gait Details: Steady gait. dyspnea 1/4.   Stairs            Wheelchair Mobility    Modified Rankin (Stroke Patients Only)       Balance Overall balance assessment: Modified Independent                                          Cognition Arousal/Alertness: Awake/alert Behavior During Therapy: WFL for tasks assessed/performed Overall Cognitive Status: Within Functional Limits for tasks assessed                                        Exercises      General Comments        Pertinent Vitals/Pain Pain Assessment:  No/denies pain    Home Living                      Prior Function            PT Goals (current goals can now be found in the care plan section) Progress towards PT goals: Goals met/education completed, patient discharged from PT    Frequency    Min 3X/week      PT Plan Current plan remains appropriate    Co-evaluation             End of Session   Activity Tolerance: Patient tolerated treatment well Patient left: with call bell/phone within reach;in bed   PT Visit Diagnosis: Muscle weakness (generalized) (M62.81);Difficulty in walking, not elsewhere classified (R26.2)     Time: 5784-6962 PT Time Calculation (min) (ACUTE ONLY): 8 min  Charges:  $Gait Training: 8-22 mins                    G Codes:       [facility] PT [place] 07/17/2016, 3:02 PM

## 2016-07-18 LAB — BASIC METABOLIC PANEL
Anion gap: 8 (ref 5–15)
BUN: 30 mg/dL — AB (ref 6–20)
CALCIUM: 8.8 mg/dL — AB (ref 8.9–10.3)
CO2: 29 mmol/L (ref 22–32)
CREATININE: 0.64 mg/dL (ref 0.44–1.00)
Chloride: 99 mmol/L — ABNORMAL LOW (ref 101–111)
GFR calc Af Amer: >60 mL/min (ref >60)
GFR calc non Af Amer: >60 mL/min (ref >60)
Glucose, Bld: 127 mg/dL — ABNORMAL HIGH (ref 65–99)
Potassium: 4.7 mmol/L (ref 3.5–5.1)
SODIUM: 136 mmol/L (ref 135–145)

## 2016-07-18 LAB — CBC
HCT: 36.8 % (ref 36.0–46.0)
Hemoglobin: 11.4 g/dL — ABNORMAL LOW (ref 12.0–15.0)
MCH: 28.9 pg (ref 26.0–34.0)
MCHC: 31 g/dL (ref 30.0–36.0)
MCV: 93.4 fL (ref 78.0–100.0)
PLATELETS: 111 10*3/uL — AB (ref 150–400)
RBC: 3.94 MIL/uL (ref 3.87–5.11)
RDW: 15.5 % (ref 11.5–15.5)
WBC: 16.5 10*3/uL — AB (ref 4.0–10.5)

## 2016-07-18 LAB — GLUCOSE, CAPILLARY: GLUCOSE-CAPILLARY: 117 mg/dL — AB (ref 65–99)

## 2016-07-18 MED ORDER — METHYLPREDNISOLONE SODIUM SUCC 125 MG IJ SOLR
60.0000 mg | Freq: Every day | INTRAMUSCULAR | Status: DC
  Administered 2016-07-18: 60 mg via INTRAVENOUS
  Filled 2016-07-18: qty 2

## 2016-07-18 MED ORDER — ORAL CARE MOUTH RINSE
15.0000 mL | Freq: Two times a day (BID) | OROMUCOSAL | Status: DC
  Administered 2016-07-18 – 2016-07-25 (×12): 15 mL via OROMUCOSAL

## 2016-07-18 NOTE — Progress Notes (Signed)
Patient with no complaints or concerns during 7pm - 7am shift. Slept during the night.   Charl Wellen, RN 

## 2016-07-18 NOTE — Progress Notes (Addendum)
Patient ID: Sheri Ayala, female   DOB: 01/27/1951, 65 y.o.   MRN: [id number]  PROGRESS NOTE    Sheri Ayala  MRN:[id number] DOB: 02/25/1951 DOA: 07/15/2016  PCP: Barr, Julie, NP   Brief Narrative:  65 y.o. female with medical history significant for COPD with chronic 2 L/m oxygen requirement, chronic lymphocytic leukemia (follows with Dr. Sherrill), atrial fibrillation but eliquis was stopped as pt had low platelets and nose bleeds, history of CVA, anxiety. She presented to ED with 1 week of progressive exertional dyspnea and cough. The symptoms progressively worsened despite her use of home inhalers. No fevers. No reports of being exposed to sick contacts.   In ED, she was tachycardic, tachypnea and hypoxic with O2 sat in mid 80's but this has improved with oxygen support. She was given solumedrol 125 mg IV and nebs but continued to be short of breath. Her blood work showed WBC count 20.9, lactic acid as high as 2.15. CXR showed no active cardiopulmonary disease, chronic interstitial disease. Ct angio chest showed no PE but it did show postobstructive atelectasis involving the lingula and left upper lobe secondary to mediastinal and left hilar masslike abnormalities presumably representing adenopathy in this patient with history of leukemia. Confluent soft tissue masses have increased in appearance and size within the mediastinum and left hilum since prior study causing postobstructive change of the left upper lobe, lingular and proximal left lower lobe bronchi.  Pt was started on azithromycin and scheduled nebs and was admitted for COPD and pneumonia management.  Assessment & Plan:   Principal Problem: Acute on chronic respiratory failure with hypoxia (HCC) / COPD with acute exacerbation (HCC) - CXR independently reviewed and showed no acute findings. CT also reviewed independently and showed obstructive atelectasis and hilar adenopathy. - She still feels chest tightness and is short of breath with  ambulating - She inconsistently uses oxygen  - Continue current nebulizer treatments: Duoneb TID scheduled and every 2 hours PRN shortness of breath or wheezing  - Continue solumedrol Q 24 hours   Active Problems: Sepsis due to pneumonia (HCC) / Community acquired pneumonia / Obstructive atelectasis / Leukocytosis  - Sepsis criteria met on admission with tachycardia, tachypnea, hypoxia, lactic acidosis, leukocytosis. Source of infection is likely pneumonia from obstructive atelectasis in the setting of hilar adenopathy - Pt was on azithromycin on admission, rocephin added 07/16/16 - Blood cultures not obtained on admission  - Follow up clinically, would not obtain BCx now unless pt spikes fever  - She has persistent leukocytosis which is likely from steroids and CLL - Will follow up lactic acid in am  Hyperkalemia - Will give low dose kayexalate - Repeat potassium WNL  CLL (chronic lymphocytic leukemia) (HCC) - Follows with Dr. Sherrill of oncology  - Sent message through EPIC to Dr. Sherrill that pt is hospitalized, oncology consultation not required at this time   Thrombocytopenia - Due to history of CLL - Follow up CBC in am  Chronic atrial fibrillation  - CHADS-VASc is 4 (age, gender, CVA x2)  - Pt not on Eliquis, stopped due to low platelets and nose bleeds - Rate controlled with Cardizem 180 mg daily   Depression - Continue Zoloft   DVT prophylaxis: SCD's for DVT prophylaxis  Code Status: full code  Family Communication: no family at the bedside this am Disposition Plan: home possibly in am   Consultants:   PT  Nutrition  Procedures:   None  Antimicrobials:   Azithromycin 07/15/2016 -->    Rocephin 07/16/2016 -->   Subjective: Short of breath with exertion.  Objective: Vitals:   07/17/16 2041 07/17/16 2156 07/17/16 2302 07/18/16 0601  BP: 133/69   127/82  Pulse: 96   82  Resp: 18   20  Temp: 98.9 F (37.2 C)   98.4 F (36.9 C)  TempSrc: Oral   Oral    SpO2: 92% 93% 92% 95%  Weight:    62.8 kg (138 lb 6.4 oz)  Height:        Intake/Output Summary (Last 24 hours) at 07/18/16 0747 Last data filed at 07/18/16 0600  Gross per 24 hour  Intake             1409 ml  Output              650 ml  Net              759 ml   Filed Weights   07/16/16 0541 07/17/16 0534 07/18/16 0601  Weight: 59.7 kg (131 lb 11.2 oz) 61.7 kg (136 lb) 62.8 kg (138 lb 6.4 oz)    Examination:  General exam: Appears calm, no distress  Respiratory system: wheezing in upper lung lobes, no crackles  Cardiovascular system: S1 & S2 heard, rate controlled  Gastrointestinal system: (+) BS, non tender, non distended  Central nervous system: Nonfocal  Extremities: No swelling, palpable pulses bilaterally  Skin: warm and dry Psychiatry: Normal mood and behavior, no restlessness   Data Reviewed: I have personally reviewed following labs and imaging studies  CBC:  Recent Labs Lab 07/15/16 1330 07/17/16 0432 07/18/16 0504  WBC 20.9* 23.7* 16.5*  NEUTROABS 9.8*  --   --   HGB 13.4 11.9* 11.4*  HCT 43.7 38.3 36.8  MCV 93.2 93.2 93.4  PLT 133* 140* 111*   Basic Metabolic Panel:  Recent Labs Lab 07/15/16 1330 07/16/16 0517 07/17/16 0432 07/18/16 0504  NA 140 139 133* 136  K 3.9 4.6 5.6* 4.7  CL 104 104 99* 99*  CO2 29 28 26 29  GLUCOSE 101* 113* 134* 127*  BUN 19 18 32* 30*  CREATININE 0.74 0.66 0.75 0.64  CALCIUM 8.9 8.7* 8.7* 8.8*   GFR: Estimated Creatinine Clearance: 68.6 mL/min (by C-G formula based on SCr of 0.64 mg/dL). Liver Function Tests:  Recent Labs Lab 07/15/16 1330  AST 40  ALT 49  ALKPHOS 68  BILITOT 0.6  PROT 5.6*  ALBUMIN 3.7   No results for input(s): LIPASE, AMYLASE in the last 168 hours. No results for input(s): AMMONIA in the last 168 hours. Coagulation Profile:  Recent Labs Lab 07/15/16 1330  INR 1.12   Cardiac Enzymes: No results for input(s): CKTOTAL, CKMB, CKMBINDEX, TROPONINI in the last 168 hours. BNP  (last 3 results) No results for input(s): PROBNP in the last 8760 hours. HbA1C: No results for input(s): HGBA1C in the last 72 hours. CBG:  Recent Labs Lab 07/16/16 0535 07/17/16 0652 07/18/16 0600  GLUCAP 121* 134* 117*   Lipid Profile: No results for input(s): CHOL, HDL, LDLCALC, TRIG, CHOLHDL, LDLDIRECT in the last 72 hours. Thyroid Function Tests: No results for input(s): TSH, T4TOTAL, FREET4, T3FREE, THYROIDAB in the last 72 hours. Anemia Panel: No results for input(s): VITAMINB12, FOLATE, FERRITIN, TIBC, IRON, RETICCTPCT in the last 72 hours. Urine analysis:    Component Value Date/Time   COLORURINE STRAW (A) 09/24/2015 1946   APPEARANCEUR CLEAR (A) 09/24/2015 1946   LABSPEC 1.010 09/24/2015 1946   PHURINE 5.0 09/24/2015 1946     GLUCOSEU NEGATIVE 09/24/2015 1946   HGBUR 1+ (A) 09/24/2015 1946   BILIRUBINUR NEGATIVE 09/24/2015 1946   KETONESUR NEGATIVE 09/24/2015 1946   PROTEINUR NEGATIVE 09/24/2015 1946   UROBILINOGEN 0.2 01/06/2015 1843   NITRITE NEGATIVE 09/24/2015 1946   LEUKOCYTESUR NEGATIVE 09/24/2015 1946   Sepsis Labs: _0 (procalcitonin:4,lacticidven:4)   )No results found for this or any previous visit (from the past 240 hour(s)).    Radiology Studies: Dg Chest 2 View  07/15/2016 No active cardiopulmonary disease. Chronic interstitial disease.   Ct Angio Chest Pe W And/or Wo Contrast  07/15/2016 1. Postobstructive atelectasis involving the lingula and left upper lobe secondary to mediastinal and left hilar masslike abnormalities presumably representing adenopathy in this patient with history of leukemia. Confluent soft tissue masses have increased in appearance and size within the mediastinum and left hilum since prior causing postobstructive change of the left upper lobe, lingular and proximal left lower lobe bronchi. 2. Small left greater than right pleural effusions with atelectatic change. Centrilobular emphysema is stable. 3. Stable hypodense nodule  of the right adrenal gland consistent with an adenoma. 4. Centrilobular emphysema. 5. Aortic atherosclerosis without aneurysm. 6. No acute central pulmonary embolus.    Marland Kitchen azithromycin  500 mg Intravenous Q24H  . cefTRIAXone (ROCEPHIN)  IV  1 g Intravenous Q24H  . cyclobenzaprine  5 mg Oral TID  . diltiazem  180 mg Oral Daily  . feeding supplement (ENSURE ENLIVE)  237 mL Oral BID BM  . ipratropium-albuterol  3 mL Nebulization TID  . methylPREDNISolone (SOLU-MEDROL) injection  60 mg Intravenous Q12H  . mometasone-formoterol  2 puff Inhalation BID  . polyethylene glycol  17 g Oral Daily  . sertraline  25 mg Oral Daily  . sodium polystyrene  15 g Oral Once  . temazepam  30 mg Oral QHS     Continuous Infusions:   LOS: 3 days    Time spent: 25 minutes  Greater than 50% of the time spent on counseling and coordinating the care.   Leisa Lenz, MD [facility] Pager [phone]  If 7PM-7AM, please contact night-coverage [email or web address] Password [facility] 07/18/2016, 7:47 AM

## 2016-07-19 ENCOUNTER — Inpatient Hospital Stay (HOSPITAL_COMMUNITY): Payer: Medicare HMO

## 2016-07-19 DIAGNOSIS — R918 Other nonspecific abnormal finding of lung field: Secondary | ICD-10-CM

## 2016-07-19 DIAGNOSIS — J449 Chronic obstructive pulmonary disease, unspecified: Secondary | ICD-10-CM

## 2016-07-19 LAB — GLUCOSE, CAPILLARY: GLUCOSE-CAPILLARY: 100 mg/dL — AB (ref 65–99)

## 2016-07-19 LAB — BASIC METABOLIC PANEL
Anion gap: 8 (ref 5–15)
BUN: 27 mg/dL — ABNORMAL HIGH (ref 6–20)
CO2: 29 mmol/L (ref 22–32)
Calcium: 8.7 mg/dL — ABNORMAL LOW (ref 8.9–10.3)
Chloride: 97 mmol/L — ABNORMAL LOW (ref 101–111)
Creatinine, Ser: 0.63 mg/dL (ref 0.44–1.00)
GFR calc Af Amer: >60 mL/min (ref >60)
Glucose, Bld: 108 mg/dL — ABNORMAL HIGH (ref 65–99)
POTASSIUM: 5.6 mmol/L — AB (ref 3.5–5.1)
SODIUM: 134 mmol/L — AB (ref 135–145)

## 2016-07-19 LAB — CBC
HCT: 39 % (ref 36.0–46.0)
Hemoglobin: 11.7 g/dL — ABNORMAL LOW (ref 12.0–15.0)
MCH: 28.2 pg (ref 26.0–34.0)
MCHC: 30 g/dL (ref 30.0–36.0)
MCV: 94 fL (ref 78.0–100.0)
PLATELETS: 113 10*3/uL — AB (ref 150–400)
RBC: 4.15 MIL/uL (ref 3.87–5.11)
RDW: 15.3 % (ref 11.5–15.5)
WBC: 27.1 10*3/uL — ABNORMAL HIGH (ref 4.0–10.5)

## 2016-07-19 LAB — LACTIC ACID, PLASMA: LACTIC ACID, VENOUS: 2.1 mmol/L — AB (ref 0.5–1.9)

## 2016-07-19 MED ORDER — SODIUM POLYSTYRENE SULFONATE 15 GM/60ML PO SUSP
15.0000 g | Freq: Once | ORAL | Status: AC
  Administered 2016-07-19: 15 g via ORAL
  Filled 2016-07-19: qty 60

## 2016-07-19 MED ORDER — SENNA 8.6 MG PO TABS
1.0000 | ORAL_TABLET | Freq: Every day | ORAL | Status: DC
  Administered 2016-07-19 – 2016-07-21 (×3): 8.6 mg via ORAL
  Filled 2016-07-19 (×3): qty 1

## 2016-07-19 MED ORDER — BISACODYL 10 MG RE SUPP
10.0000 mg | Freq: Once | RECTAL | Status: AC
  Administered 2016-07-19: 10 mg via RECTAL
  Filled 2016-07-19: qty 1

## 2016-07-19 MED ORDER — GUAIFENESIN-DM 100-10 MG/5ML PO SYRP: 5.0000 mL | ORAL_SOLUTION | ORAL | Status: DC | PRN

## 2016-07-19 MED ORDER — METHYLPREDNISOLONE SODIUM SUCC 40 MG IJ SOLR
40.0000 mg | Freq: Every day | INTRAMUSCULAR | Status: DC
  Administered 2016-07-19 – 2016-07-22 (×4): 40 mg via INTRAVENOUS
  Filled 2016-07-19 (×4): qty 1

## 2016-07-19 NOTE — Progress Notes (Signed)
Patient ID: Sheri Ayala, female   DOB: [birth year], 66 y.o.   MRN: [id number]  PROGRESS NOTE    Hally I Devins  IFO:[id number] DOB: May 12, 1950 DOA: 07/15/2016  PCP: Alvester Chou, NP   Brief Narrative:  66 y.o. female with medical history significant for COPD with chronic 2 L/m oxygen requirement, chronic lymphocytic leukemia (follows with Dr. Benay Spice), atrial fibrillation but eliquis was stopped as pt had low platelets and nose bleeds, history of CVA, anxiety. She presented to ED with 1 week of progressive exertional dyspnea and cough. The symptoms progressively worsened despite her use of home inhalers. No fevers. No reports of being exposed to sick contacts.   In ED, she was tachycardic, tachypnea and hypoxic with O2 sat in mid 80's but this has improved with oxygen support. She was given solumedrol 125 mg IV and nebs but continued to be short of breath. Her blood work showed WBC count 20.9, lactic acid as high as 2.15. CXR showed no active cardiopulmonary disease, chronic interstitial disease. Ct angio chest showed no PE but it did show postobstructive atelectasis involving the lingula and left upper lobe secondary to mediastinal and left hilar masslike abnormalities presumably representing adenopathy in this patient with history of leukemia. Confluent soft tissue masses have increased in appearance and size within the mediastinum and left hilum since prior study causing postobstructive change of the left upper lobe, lingular and proximal left lower lobe bronchi.  Pt was started on azithromycin and scheduled nebs and was admitted for COPD and pneumonia management.  Assessment & Plan:   Principal Problem: Acute on chronic respiratory failure with hypoxia (HCC) / COPD with acute exacerbation ([place]) - CXR independently reviewed and showed no acute findings. CT also reviewed independently and showed obstructive atelectasis and hilar adenopathy. - She still feels chest tightness and complains of pain along  the right side of the back. We will obtain CXR this am for further evaluation - Continue current nebulizer treatments: Duoneb TID scheduled and every 2 hours PRN shortness of breath or wheezing  - Continue solumedrol daily but decrease from 60 mg to 40 mg dose - Continue oxygen support via [place] to keep O2 saturation above 90%  Active Problems: Sepsis due to pneumonia ([place]) / Community acquired pneumonia / Obstructive atelectasis / Leukocytosis  - Sepsis criteria met on admission with tachycardia, tachypnea, hypoxia, lactic acidosis, leukocytosis. Source of infection is likely pneumonia from obstructive atelectasis in the setting of hilar adenopathy - Pt was on azithromycin on admission, rocephin added 07/16/16 - Blood cultures not obtained on admission  - Follow up clinically, would not obtain BCx now unless pt spikes fever  - She has persistent leukocytosis which is likely from steroids and CLL - Lactic acid 2.1 today; repeat lactic acid in am  Hyperkalemia - Potassium was 5.6 on 4/3; she was given kayexalate and her potassium normalized on 4/4 - Potassium again 5.6 so she will get low dose kayexalate - Repeat BMP in am  CLL (chronic lymphocytic leukemia) ([place]) - Follows with Dr. Benay Spice of oncology  - Sent message through EPIC to Dr. Benay Spice that pt is hospitalized, oncology consultation not required at this time   Anemia of chronic disease - Hemoglobin 11.7, stable   Thrombocytopenia - Due to history of CLL - Platelets 111 - 113  Chronic atrial fibrillation  - CHADS-VASc is 64 (age, gender, CVA x2)  - Pt not on Eliquis, stopped due to low platelets and nose bleeds - Rate controlled with Cardizem 180  mg daily   Depression - Continue Zoloft   DVT prophylaxis: SCD's for DVT prophylaxis  Code Status: full code  Family Communication: no family at the bedside this am Disposition Plan: home once her resp status better    Consultants:   PT  Nutrition  Procedures:    None  Antimicrobials:   Azithromycin 07/15/2016 -->  Rocephin 07/16/2016 -->   Subjective: Short of breath with exertion.  Objective: Vitals:   07/18/16 1947 07/18/16 2011 07/19/16 0643 07/19/16 0730  BP:  (!) 93/53 117/65   Pulse:  77 71   Resp:  20 20   Temp:  98.1 F (36.7 C) 97.5 F (36.4 C)   TempSrc:  Oral Oral   SpO2: 98% 93% 96% 98%  Weight:   63.3 kg (139 lb 8 oz)   Height:        Intake/Output Summary (Last 24 hours) at 07/19/16 0812 Last data filed at 07/18/16 1835  Gross per 24 hour  Intake             1229 ml  Output                0 ml  Net             1229 ml   Filed Weights   07/17/16 0534 07/18/16 0601 07/19/16 0643  Weight: 61.7 kg (136 lb) 62.8 kg (138 lb 6.4 oz) 63.3 kg (139 lb 8 oz)    Examination:  General exam: Appears calm, no distress  Respiratory system: diminished on left more so than on right, mild wheezing in upper lung lobes  Cardiovascular system: S1 & S2 heard, rate controlled  Gastrointestinal system: (+) BS, no distention, no tenderness  Central nervous system: Nonfocal  Extremities: No swelling, palpable pulses  Skin: warm and dry, no lesions or ulcers  Psychiatry: Normal mood and behavior  Data Reviewed: I have personally reviewed following labs and imaging studies  CBC:  Recent Labs Lab 07/15/16 1330 07/17/16 0432 07/18/16 0504 07/19/16 0407  WBC 20.9* 23.7* 16.5* 27.1*  NEUTROABS 9.8*  --   --   --   HGB 13.4 11.9* 11.4* 11.7*  HCT 43.7 38.3 36.8 39.0  MCV 93.2 93.2 93.4 94.0  PLT 133* 140* 111* 696*   Basic Metabolic Panel:  Recent Labs Lab 07/15/16 1330 07/16/16 0517 07/17/16 0432 07/18/16 0504 07/19/16 0407  NA 140 139 133* 136 134*  K 3.9 4.6 5.6* 4.7 5.6*  CL 104 104 99* 99* 97*  CO2 '29 28 26 29 29  '$ GLUCOSE 101* 113* 134* 127* 108*  BUN 19 18 32* 30* 27*  CREATININE 0.74 0.66 0.75 0.64 0.63  CALCIUM 8.9 8.7* 8.7* 8.8* 8.7*   GFR: Estimated Creatinine Clearance: 69.1 mL/min (by C-G formula  based on SCr of 0.63 mg/dL). Liver Function Tests:  Recent Labs Lab 07/15/16 1330  AST 40  ALT 49  ALKPHOS 68  BILITOT 0.6  PROT 5.6*  ALBUMIN 3.7   No results for input(s): LIPASE, AMYLASE in the last 168 hours. No results for input(s): AMMONIA in the last 168 hours. Coagulation Profile:  Recent Labs Lab 07/15/16 1330  INR 1.12   Cardiac Enzymes: No results for input(s): CKTOTAL, CKMB, CKMBINDEX, TROPONINI in the last 168 hours. BNP (last 3 results) No results for input(s): PROBNP in the last 8760 hours. HbA1C: No results for input(s): HGBA1C in the last 72 hours. CBG:  Recent Labs Lab 07/16/16 0535 07/17/16 0652 07/18/16 0600 07/19/16 [place]  121* 134* 117* 100*   Lipid Profile: No results for input(s): CHOL, HDL, LDLCALC, TRIG, CHOLHDL, LDLDIRECT in the last 72 hours. Thyroid Function Tests: No results for input(s): TSH, T4TOTAL, FREET4, T3FREE, THYROIDAB in the last 72 hours. Anemia Panel: No results for input(s): VITAMINB12, FOLATE, FERRITIN, TIBC, IRON, RETICCTPCT in the last 72 hours. Urine analysis:    Component Value Date/Time   COLORURINE STRAW (A) 09/24/2015 1946   APPEARANCEUR CLEAR (A) 09/24/2015 1946   LABSPEC 1.010 09/24/2015 1946   PHURINE 5.0 09/24/2015 1946   GLUCOSEU NEGATIVE 09/24/2015 1946   HGBUR 1+ (A) 09/24/2015 1946   BILIRUBINUR NEGATIVE 09/24/2015 1946   KETONESUR NEGATIVE 09/24/2015 1946   PROTEINUR NEGATIVE 09/24/2015 1946   UROBILINOGEN 0.2 01/06/2015 1843   NITRITE NEGATIVE 09/24/2015 1946   LEUKOCYTESUR NEGATIVE 09/24/2015 1946   Sepsis Labs: '@LABRCNTIP'$ (procalcitonin:4,lacticidven:4)   )No results found for this or any previous visit (from the past 240 hour(s)).    Radiology Studies: Dg Chest 2 View  07/15/2016 No active cardiopulmonary disease. Chronic interstitial disease.   Ct Angio Chest Pe W And/or Wo Contrast  07/15/2016 1. Postobstructive atelectasis involving the lingula and left upper lobe secondary to  mediastinal and left hilar masslike abnormalities presumably representing adenopathy in this patient with history of leukemia. Confluent soft tissue masses have increased in appearance and size within the mediastinum and left hilum since prior causing postobstructive change of the left upper lobe, lingular and proximal left lower lobe bronchi. 2. Small left greater than right pleural effusions with atelectatic change. Centrilobular emphysema is stable. 3. Stable hypodense nodule of the right adrenal gland consistent with an adenoma. 4. Centrilobular emphysema. 5. Aortic atherosclerosis without aneurysm. 6. No acute central pulmonary embolus.    Marland Kitchen azithromycin  500 mg Oral QHS  . cefTRIAXone (ROCEPHIN)  IV  1 g Intravenous Q24H  . cyclobenzaprine  5 mg Oral TID  . diltiazem  180 mg Oral Daily  . feeding supplement (ENSURE ENLIVE)  237 mL Oral BID BM  . ipratropium-albuterol  3 mL Nebulization TID  . mouth rinse  15 mL Mouth Rinse BID  . methylPREDNISolone (SOLU-MEDROL) injection  60 mg Intravenous Daily  . mometasone-formoterol  2 puff Inhalation BID  . polyethylene glycol  17 g Oral Daily  . senna  1 tablet Oral Daily  . sertraline  25 mg Oral Daily  . sodium chloride flush  3 mL Intravenous Q12H  . sodium chloride flush  3 mL Intravenous Q12H  . sodium polystyrene  15 g Oral Once  . temazepam  30 mg Oral QHS     Continuous Infusions:   LOS: 4 days    Time spent: 25 minutes  Greater than 50% of the time spent on counseling and coordinating the care.   Leisa Lenz, MD [facility] Pager [phone]  If 7PM-7AM, please contact night-coverage [email or web address] Password [email or web address] 07/19/2016, 8:12 AM

## 2016-07-19 NOTE — Progress Notes (Signed)
VSS during 7 a to 7 p shift, patients only complaint is constipation.  Patient received Miralax, Senokot, and Kayexalate without any results.  Notified Md and Dulcolax suppository was ordered, again no results.  Text page sent to doctor requesting enema.

## 2016-07-19 NOTE — Progress Notes (Signed)
CRITICAL VALUE ALERT  Critical value received:  Lactic 2.1  Date of notification:  07/19/2016  Time of notification:  0525  Critical value read back: Yes  Nurse who received alert:  Hermina Barters  MD notified (1st page):  Forrest Moron NP  Time of first page:  [phone]  Notified via [place]

## 2016-07-19 NOTE — Care Management Important Message (Signed)
Important Message  Patient Details  Name: Sheri Ayala MRN: [id number] Date of Birth: 21-Dec-1950   Medicare Important Message Given:  Yes    Narissa Beaufort Montine Circle 07/19/2016, 11:31 AM

## 2016-07-19 NOTE — Progress Notes (Signed)
IP PROGRESS NOTE  Subjective:   [name] is well-known to me with a history of chronic lymphocytic leukemia that has not required treatment. She has COPD and recurrent pneumonia. She was found to have a lung mass in 2017 and underwent an evaluation by Sheri Ayala. She underwent a nondiagnostic bronchoscopy 04/18/2016. She was admitted with COPD and pneumonia in January. We have attempted to schedule an outpatient PET scan multiple times over the past several months, but Sheri Ayala has not been able to schedule the study. She was admitted for 1 2018 with a cough, dyspnea, and chest discomfort. She complains of pain at the right posterior and anterior chest. A CT of the chest reveals post obstructive atelectasis at the lingula and left upper lobe with mediastinal and left hilar masses.. She reports a good appetite.    Objective: Vital signs in last 24 hours: Blood pressure 107/85, pulse 84, temperature 97.5 F (36.4 C), temperature source Oral, resp. rate 20, height '5\' 9"'$  (1.753 m), weight 139 lb 8 oz (63.3 kg), SpO2 95 %.  Intake/Output from previous day: 04/04 0701 - 04/05 0700 In: 1229 [P.O.:1129; IV Piggyback:100] Out: -   Physical Exam:  HEENT: Neck without mass Lungs: Decreased breath sounds at the left lower chest, no respiratory distress Cardiac: Irregular Abdomen: No hepatosplenomegaly Extremities: No leg edema Lymph nodes: No cervical, supraclavicular, or right axillary nodes. "Pea sized "left axillary node   Lab Results:  Recent Labs  07/18/16 0504 07/19/16 0407  WBC 16.5* 27.1*  HGB 11.4* 11.7*  HCT 36.8 39.0  PLT 111* 113*    BMET  Recent Labs  07/18/16 0504 07/19/16 0407  NA 136 134*  K 4.7 5.6*  CL 99* 97*  CO2 29 29  GLUCOSE 127* 108*  BUN 30* 27*  CREATININE 0.64 0.63  CALCIUM 8.8* 8.7*    Studies/Results: Dg Chest Port 1 View  Result Date: 07/19/2016 CLINICAL DATA:  Two weeks of right-sided chest pain. Former long-term smoker discontinued 2  months ago. EXAM: PORTABLE CHEST 1 VIEW COMPARISON:  Chest x-ray and chest CT scan of July 15, 2016 FINDINGS: The lingular and left upper lobe collapse has progressed. There is pleural fluid versus pleural thickening in the left apex. There is a minimal shift of the mediastinum toward the left. There is patchy density at the right lung base with new small right pleural effusion. The interstitial markings elsewhere in the right lung coarse but stable. The cardiac silhouette is top-normal in size. The pulmonary vascularity is not clearly engorged there is no pneumothorax. IMPRESSION: Worsening of left upper lobe and lingular atelectasis due to known mediastinal and hilar masses. Developing left basilar atelectasis or infiltrate. Underlying COPD. Electronically Signed   By: Sheri  Ayala M.D.   On: 07/19/2016 07:37   CT images reviewed Medications: I have reviewed the patient's current medications.  Assessment/Plan:  1. 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 COPD 3. Left lung mass/mediastinal adenopathy status post a nondiagnostic bronchoscopy 04/18/2016 4. Atrial fibrillation 5. Leukocytosis secondary to CLL and steroids  Sheri Ayala has been admitted repeatedly with respiratory failure. There is evidence of progressive tumor in the chest. I suspect the chest adenopathy is related to lung cancer as opposed to CLL or lymphoma. She needs a diagnostic biopsy and then further staging evaluation.  Recommendations: 1. Consult CVTS to consider a diagnostic bronchoscopy and EBUS/mediastinoscopy as needed  2. Outpatient follow-up will be scheduled at the [facility]   LOS: 4 days   [facility],  Sheri Severin, MD   07/19/2016, 12:14 PM

## 2016-07-20 DIAGNOSIS — R590 Localized enlarged lymph nodes: Secondary | ICD-10-CM

## 2016-07-20 DIAGNOSIS — D696 Thrombocytopenia, unspecified: Secondary | ICD-10-CM

## 2016-07-20 DIAGNOSIS — J181 Lobar pneumonia, unspecified organism: Secondary | ICD-10-CM

## 2016-07-20 LAB — BASIC METABOLIC PANEL
ANION GAP: 8 (ref 5–15)
BUN: 21 mg/dL — ABNORMAL HIGH (ref 6–20)
CHLORIDE: 93 mmol/L — AB (ref 101–111)
CO2: 33 mmol/L — ABNORMAL HIGH (ref 22–32)
Calcium: 8.4 mg/dL — ABNORMAL LOW (ref 8.9–10.3)
Creatinine, Ser: 0.55 mg/dL (ref 0.44–1.00)
GFR calc Af Amer: >60 mL/min (ref >60)
GFR calc non Af Amer: >60 mL/min (ref >60)
GLUCOSE: 100 mg/dL — AB (ref 65–99)
POTASSIUM: 4.7 mmol/L (ref 3.5–5.1)
Sodium: 134 mmol/L — ABNORMAL LOW (ref 135–145)

## 2016-07-20 LAB — CBC
HCT: 38.2 % (ref 36.0–46.0)
HEMOGLOBIN: 11.3 g/dL — AB (ref 12.0–15.0)
MCH: 28 pg (ref 26.0–34.0)
MCHC: 29.6 g/dL — ABNORMAL LOW (ref 30.0–36.0)
MCV: 94.6 fL (ref 78.0–100.0)
PLATELETS: 92 10*3/uL — AB (ref 150–400)
RBC: 4.04 MIL/uL (ref 3.87–5.11)
RDW: 15.2 % (ref 11.5–15.5)
WBC: 19.8 10*3/uL — AB (ref 4.0–10.5)

## 2016-07-20 LAB — GLUCOSE, CAPILLARY: GLUCOSE-CAPILLARY: 92 mg/dL (ref 65–99)

## 2016-07-20 MED ORDER — ALBUTEROL SULFATE (2.5 MG/3ML) 0.083% IN NEBU
2.5000 mg | INHALATION_SOLUTION | RESPIRATORY_TRACT | Status: DC | PRN
  Administered 2016-07-23: 2.5 mg via RESPIRATORY_TRACT

## 2016-07-20 MED ORDER — POLYETHYLENE GLYCOL 3350 17 G PO PACK
17.0000 g | PACK | Freq: Three times a day (TID) | ORAL | Status: DC
  Administered 2016-07-20 – 2016-07-26 (×9): 17 g via ORAL
  Filled 2016-07-20 (×13): qty 1

## 2016-07-20 MED ORDER — BISACODYL 10 MG RE SUPP
10.0000 mg | Freq: Every day | RECTAL | Status: DC
  Administered 2016-07-20: 10 mg via RECTAL
  Filled 2016-07-20 (×3): qty 1

## 2016-07-20 NOTE — Progress Notes (Addendum)
PROGRESS NOTE  Sheri Ayala WNI:[id number] DOB: 1950/12/27 DOA: 07/15/2016 PCP: Alvester Chou, NP  Brief Narrative: 66 year old woman PMH COPD on home oxygen presented with progressive exertional dyspnea, cough. Admitted for COPD exacerbation.  Assessment/Plan 1. Acute on chronic hypoxic respiratory failure secondary to COPD exacerbation, sepsis, pneumonia. Clinically improving.   Continue treatments as below. 2. COPD exacerbation with chronic hypoxic respiratory failure on 2 L oxygen at home. CT chest negative for pulmonary embolism,.   Slowly improving. Continue bronchodilators, supplemental oxygen. 3. Postobstructive atelectasis lingula, left upper lobe secondary to increased hilar adenopathy/masslike abnormalities. Lung mass in 2017 was evaluated with bronchoscopy which was nondiagnostic at that time January 2018. Oncology suspects lung cancer rather than CLL or lymphoma and recommends diagnostic biopsy and further staging, to pursue thoracic surgery evaluation in the outpatient setting, possibly bronchoscopy and mediastinoscopy.  Will have bronch/mediastinoscopy Monday per Dr. Servando Snare if still in house. If she goes home over the weekend, contact Dr. Servando Snare at [phone] so that he can cancel OR time and arrange outpatient followup. 4. Sepsis secondary to pneumonia, obstructive atelectasis, hilar adenopathy.  Sepsis resolved. Continue empiric antibiotics. 5. Hyperkalemia. Treated with Kayexalate, recurrent. Now resolved. Monitor clinically. 6. CLL with associated anemia and thrombocytopenia. Appreciate oncology follow-up. 7. PMH afib not on anticoagulation. CHA2DS2-VASc 4. Not on anticoagulation secondary to thrombocytopenia and epistaxis. 8. Constipation.  Aggressive bowel regimen. 9. Aortic atherosclerosis. Asymptomatic.   Improving, hemodynamics stable. Continue steroids, antibiotics. Follow-up thoracic surgery recommendations.  DVT prophylaxis: SCDs Code Status: full  code Family Communication: none Disposition Plan: home when ready, no OT/PT recommended    Murray Hodgkins, MD  [facility] Direct contact: [phone] --[place] [place]  --[email or web address]; password [email or web address]  7PM-7AM contact night coverage as above 07/20/2016, 9:26 AM  LOS: 5 days   Consultants:  Oncology  Procedures:    Antimicrobials:  Ceftriaxone 4/1 >>  Azithromycin 4/1 >>  Interval history/Subjective: Breathing better, but still wheezing and SOB. No BM x6 days. Dry cough.  Objective: Vitals:   07/19/16 2029 07/19/16 2040 07/20/16 0444 07/20/16 0738  BP:  129/75 129/76   Pulse: 83 88 78   Resp: '18 18 18   '$ Temp:  97.6 F (36.4 C) 98 F (36.7 C)   TempSrc:  Oral Oral   SpO2: 93% 93% 96% 94%  Weight:   63.6 kg (140 lb 3.2 oz)   Height:        Intake/Output Summary (Last 24 hours) at 07/20/16 0926 Last data filed at 07/20/16 0600  Gross per 24 hour  Intake              120 ml  Output              300 ml  Net             -180 ml     Filed Weights   07/18/16 0601 07/19/16 0643 07/20/16 0444  Weight: 62.8 kg (138 lb 6.4 oz) 63.3 kg (139 lb 8 oz) 63.6 kg (140 lb 3.2 oz)    Exam:    Constitutional: Appears calm, comfortable Respiratory: Some wheeze, no rhonchi or rales, fair air movement. Mild shortness of breath noted. Cardiovascular: Regular rate and rhythm. No murmur, rub or gallop. No lower extremity edema. Psychiatric: Grossly normal mood and affect. Speech fluent and appropriate.   I have personally reviewed following labs and imaging studies:  Basic metabolic panel unremarkable. Potassium is normal. WBC 19.8, platelets stable 92.  Scheduled Meds: . azithromycin  500  mg Oral QHS  . bisacodyl  10 mg Rectal Daily  . cefTRIAXone (ROCEPHIN)  IV  1 g Intravenous Q24H  . cyclobenzaprine  5 mg Oral TID  . diltiazem  180 mg Oral Daily  . feeding supplement (ENSURE ENLIVE)  237 mL Oral BID BM  . ipratropium-albuterol  3 mL Nebulization TID  .  mouth rinse  15 mL Mouth Rinse BID  . methylPREDNISolone (SOLU-MEDROL) injection  40 mg Intravenous Daily  . mometasone-formoterol  2 puff Inhalation BID  . polyethylene glycol  17 g Oral TID  . senna  1 tablet Oral Daily  . sertraline  25 mg Oral Daily  . sodium chloride flush  3 mL Intravenous Q12H  . sodium chloride flush  3 mL Intravenous Q12H  . temazepam  30 mg Oral QHS   Continuous Infusions:  Principal Problem:   Acute on chronic respiratory failure with hypoxia (HCC) Active Problems:   CLL (chronic lymphocytic leukemia) (HCC)   Thrombocytopenia (HCC)   COPD with acute exacerbation (HCC)   Chronic a-fib (HCC)   History of CVA (cerebrovascular accident)   Obstructive atelectasis   Leukocytosis   Lobar pneumonia (HCC)   Hilar adenopathy   LOS: 5 days

## 2016-07-20 NOTE — Progress Notes (Signed)
Paged MD regarding pt having neck pain. Pt norco/vicodin discontinued. MD stated to give 1600 dose of flexeril now. MD aware pt last dose of flexeril was 0935.  Will continue to montior  Sheri Ayala

## 2016-07-20 NOTE — Progress Notes (Signed)
Pt states she wants to hold off on the dulcolax suppository at this time and take the Senokot and Miralax. Pt educated on on ambulation benefits, states she'll walk later today  Dagmar Adcox Leory Plowman

## 2016-07-20 NOTE — Consult Note (Addendum)
[address]       [place] [address]             [phone]        Sheri Ayala [place] Medical Record #[id number] Date of Birth: 1950/10/23  Referring: Dr. Sarajane Jews, MD Primary Care: Alvester Chou, NP  Chief Complaint:    Chief Complaint  Patient presents with  . Shortness of Breath  . Cough   Reason for consultation: Post obstructive atelectasis, of the lingula and LUL secondary to mediastinal and left hilar mass like abnormalities, lymphadenopathy  History of Present Illness:     This is a 66 year old Caucasian female with a past medical history significant for COPD with chronic 2 L/m some low oxygen requirement, chronic lymphocytic leukemia, atrial fibrillation-was on Eliquis, history of CVA, anxiety, and insomnia who presented to the emergency department on 07/15/2016 with 1 week of progressive exertional dyspnea and cough. Patient reports that she had been in her usual state of health until approximately one week ago when she noted the insidious development of worsening exertional dyspnea and a cough. Over the ensuing days, the symptoms progressively worsened despite her use of home inhalers. She also has complaints of right shoulder pain for the last few weeks. She denies trauma. She states she has had occasional fever and chills. She denies chest pain or palpitations,  lower extremity swelling or tenderness, long distance travel, prolonged immobilization, or recent sick contacts. She has not been on antibiotics or steroids for this. She denies orthopnea. Symptoms are worse with any exertion and have worsened to the point where she has been symptomatic while at rest for the past couple days, prompting her presentation to the ED tonight. She was transported by ambulance and received 125 mg IV Solu-Medrol, duo nebs, and 2 g of magnesium en route.   ED Course: Upon arrival to the ED, patient is found to be afebrile, saturating in the high 80s, slightly  tachycardic and tachypneic, and with stable blood pressure. EKG features atrial fibrillation and chest x-ray is negative for acute cardiopulmonary disease, but notable for chronic interstitial disease. Chemistry panel is unremarkable and CBC is notable for a leukocytosis to 20,900 with smudge cells, and a mild thrombocytopenia with platelets 133,000. Lactic acid was reassuring at 1.74 and troponin was negative 2. D-dimer was obtained and positive at 0.75. CTA PE study was obtained and negative for PE, but notable for postobstructive atelectasis involving the lingula and left upper lobe secondary to mediastinal and left hilar masslike abnormalities which are presumed to be adenopathy related to the patient's leukemia and increased since the prior study. Patient was given a liter of normal saline and duo nebs in the ED. She reported some subjective improvement with the breathing treatment, but continues to be working to breathe while at rest and will need to be admitted to the hospital for ongoing evaluation and management of acute exacerbation in COPD.  Of note, Dr. Melvyn Novas performed an endobronchial biopsy of the LLL on 04/18/2016 and result was nondiagnostic Oncology suspects lung cancer rather than CLL or lymphoma and recommends diagnostic biopsy and further staging. Per Dr. Gearldine Shown note, outpatient PET scan has been attempted to be scheduled multiple times but not done yet. Dr. Servando Snare has been consulted regarding mediastinal and left hilar mass like abnormalities as she will need a bronchoscopy, EBUS, and possible mediastinoscopy in order to obtain a diagnosis.  Current Activity/ Functional Status: Patient is independent with mobility/ambulation,  transfers, ADL's, IADL's.   Zubrod Score: At the time of surgery this patient's most appropriate activity status/level should be described as: '[]'$     0    Normal activity, no symptoms '[]'$     1    Restricted in physical strenuous activity but ambulatory, able  to do out light work '[x]'$     2    Ambulatory and capable of self care, unable to do work activities, up and about more than 50%  Of the time                            '[]'$     3    Only limited self care, in bed greater than 50% of waking hours '[]'$     4    Completely disabled, no self care, confined to bed or chair '[]'$     5    Moribund  Past Medical History:  Diagnosis Date  . Allergy   . Anxiety   . Asthma   . Atrial fibrillation ([facility]) 04/2016  . Bronchospasm   . Cerebrovascular accident (CVA) ([facility]) 08/28/2015  . Chronic lymphocytic leukemia ([facility]) 2004  . COPD (chronic obstructive pulmonary disease) ([facility])   . Depression   . Emphysema of lung ([facility])   . Frontal sinusitis November 2012  . H/O viral encephalitis   . Headache(784.0)   . Heart murmur   . Mitral prolapse 11/24/2015  . Mitral regurgitation 11/24/2015  . Olecranon bursitis of left elbow November 2012.   in past  . Pleurisy   . Pneumonia    13 times  . Rhinitis   . Thrombocytopenia ([facility])   . Tobacco abuse   . Tricuspid valve regurgitation 11/24/2015    Past Surgical History:  Procedure Laterality Date  . ABDOMINAL HYSTERECTOMY    . COLONOSCOPY    . UPPER GASTROINTESTINAL ENDOSCOPY    . VIDEO BRONCHOSCOPY Bilateral 04/18/2016   Procedure: VIDEO BRONCHOSCOPY WITHOUT FLUORO;  Surgeon: Tanda Rockers, MD;  Location: [facility];  Service: Cardiopulmonary;  Laterality: Bilateral;    Social History   Social History  . Marital status: Single    Spouse name: N/A  . Number of children: N/A  . Years of education: N/A    Social History Main Topics  . Smoking status: Current Every Day Smoker. She states she quit 3 months ago.    Packs/day: 0.50    Years: 45.00    Types: Cigarettes, E-cigarettes  . Smokeless tobacco: Never Used  . Alcohol use No  . Drug use: No  . Sexual activity: Not Currently    Allergies  Allergen Reactions  . Citrus Other (See Comments)    Burning in stomach   . Doxycycline Other (See  Comments)    Caused pt daily headaches to be more severe   . Penicillins Hives, Nausea And Vomiting, Swelling and Other (See Comments)    Patient reports tolerating ampicillin.  Has patient had a PCN reaction causing immediate rash, facial/tongue/throat swelling, SOB or lightheadedness with hypotension: Yes Has patient had a PCN reaction causing severe rash involving mucus membranes or skin necrosis: unknown Has patient had a PCN reaction that required hospitalization No Has patient had a PCN reaction occurring within the last 10 years: No If all of the above answers are "NO", then may proceed with Cephalosporin use.     Current Facility-Administered Medications  Medication Dose Route Frequency Provider Last Rate Last Dose  . 0.9 %  sodium chloride  infusion  250 mL Intravenous PRN Vianne Bulls, MD      . acetaminophen (TYLENOL) tablet 650 mg  650 mg Oral Q6H PRN Vianne Bulls, MD       Or  . acetaminophen (TYLENOL) suppository 650 mg  650 mg Rectal Q6H PRN Ilene Qua Opyd, MD      . albuterol (PROVENTIL) (2.5 MG/3ML) 0.083% nebulizer solution 2.5 mg  2.5 mg Nebulization Q2H PRN Samuella Cota, MD      . ALPRAZolam Duanne Moron) tablet 0.5 mg  0.5 mg Oral BID PRN Vianne Bulls, MD   0.5 mg at 07/20/16 0934  . azithromycin (ZITHROMAX) tablet 500 mg  500 mg Oral QHS Robbie Lis, MD   500 mg at 07/19/16 2115  . bisacodyl (DULCOLAX) suppository 10 mg  10 mg Rectal Daily Samuella Cota, MD      . cefTRIAXone (ROCEPHIN) 1 g in dextrose 5 % 50 mL IVPB  1 g Intravenous Q24H Robbie Lis, MD   1 g at 07/20/16 0934  . cyclobenzaprine (FLEXERIL) tablet 5 mg  5 mg Oral TID Vianne Bulls, MD   5 mg at 07/20/16 1412  . diltiazem (CARDIZEM CD) 24 hr capsule 180 mg  180 mg Oral Daily Vianne Bulls, MD   180 mg at 07/20/16 0935  . diphenhydrAMINE (BENADRYL) capsule 25 mg  25 mg Oral Q4H PRN Ilene Qua Opyd, MD      . feeding supplement (ENSURE ENLIVE) (ENSURE ENLIVE) liquid 237 mL  237 mL Oral BID BM  Ilene Qua Opyd, MD   237 mL at 07/20/16 1412  . guaiFENesin-dextromethorphan (ROBITUSSIN DM) 100-10 MG/5ML syrup 5 mL  5 mL Oral Q4H PRN Robbie Lis, MD      . ipratropium-albuterol (DUONEB) 0.5-2.5 (3) MG/3ML nebulizer solution 3 mL  3 mL Nebulization TID Vianne Bulls, MD   3 mL at 07/20/16 1342  . MEDLINE mouth rinse  15 mL Mouth Rinse BID Robbie Lis, MD   15 mL at 07/20/16 1000  . methylPREDNISolone sodium succinate (SOLU-MEDROL) 40 mg/mL injection 40 mg  40 mg Intravenous Daily Robbie Lis, MD   40 mg at 07/20/16 0935  . mometasone-formoterol (DULERA) 200-5 MCG/ACT inhaler 2 puff  2 puff Inhalation BID Vianne Bulls, MD   2 puff at 07/20/16 0738  . ondansetron (ZOFRAN) tablet 4 mg  4 mg Oral Q6H PRN Vianne Bulls, MD       Or  . ondansetron (ZOFRAN) injection 4 mg  4 mg Intravenous Q6H PRN Ilene Qua Opyd, MD      . polyethylene glycol (MIRALAX / GLYCOLAX) packet 17 g  17 g Oral TID Samuella Cota, MD   17 g at 07/20/16 0936  . senna (SENOKOT) tablet 8.6 mg  1 tablet Oral Daily Robbie Lis, MD   8.6 mg at 07/20/16 0934  . sertraline (ZOLOFT) tablet 25 mg  25 mg Oral Daily Vianne Bulls, MD   25 mg at 07/20/16 0935  . sodium chloride flush (NS) 0.9 % injection 3 mL  3 mL Intravenous Q12H Vianne Bulls, MD   3 mL at 07/20/16 0936  . sodium chloride flush (NS) 0.9 % injection 3 mL  3 mL Intravenous Q12H Vianne Bulls, MD   3 mL at 07/20/16 0936  . sodium chloride flush (NS) 0.9 % injection 3 mL  3 mL Intravenous PRN Vianne Bulls, MD      .  temazepam (RESTORIL) capsule 30 mg  30 mg Oral QHS Vianne Bulls, MD   30 mg at 07/19/16 2115    Prescriptions Prior to Admission  Medication Sig Dispense Refill Last Dose  . albuterol (PROVENTIL) (2.5 MG/3ML) 0.083% nebulizer solution Take 3 mLs (2.5 mg total) by nebulization every 4 (four) hours as needed for wheezing or shortness of breath. 75 mL 0 07/15/2016 at Unknown time  . ALPRAZolam (XANAX) 0.5 MG tablet Take 1 tablet (0.5 mg  total) by mouth 2 (two) times daily as needed for anxiety or sleep. 6 tablet 0 Past Week at Unknown time  . budesonide-formoterol (SYMBICORT) 160-4.5 MCG/ACT inhaler Take 2 puffs first thing in am and then another 2 puffs about 12 hours later. 1 Inhaler 11 07/15/2016 at Unknown time  . cyclobenzaprine (FLEXERIL) 5 MG tablet Take 1 tablet (5 mg total) by mouth 3 (three) times daily. (Patient taking differently: Take 5 mg by mouth 3 (three) times daily as needed for muscle spasms. ) 21 tablet 0 Past Week at Unknown time  . diphenhydrAMINE (BENADRYL) 25 mg capsule Take 1 capsule (25 mg total) by mouth every 4 (four) hours as needed (rash). 30 capsule 0 Past Week at Unknown time  . furosemide (LASIX) 40 MG tablet Take 1 tablet (40 mg total) by mouth every other day. 30 tablet 0 Past Week at Unknown time  . metoprolol succinate (TOPROL-XL) 25 MG 24 hr tablet Take 12.5 mg by mouth daily.   07/15/2016 at 0900  . OXYGEN Inhale 2 L into the lungs as needed (for shortness of breath).    07/15/2016 at Unknown time  . sertraline (ZOLOFT) 25 MG tablet Take 25 mg by mouth daily.   Past Week at Unknown time  . temazepam (RESTORIL) 30 MG capsule Take 1 capsule (30 mg total) by mouth at bedtime. 5 capsule 0 Past Week at Unknown time  . VENTOLIN HFA 108 (90 Base) MCG/ACT inhaler Inhale 2 puffs into the lungs every 4 (four) hours as needed for wheezing or shortness of breath.    07/15/2016 at Unknown time  . apixaban (ELIQUIS) 5 MG TABS tablet Take 1 tablet (5 mg total) by mouth 2 (two) times daily. (Patient not taking: Reported on 07/16/2016) 60 tablet 0 Not Taking at Unknown time  . diltiazem (CARDIZEM CD) 180 MG 24 hr capsule Take 1 capsule (180 mg total) by mouth daily. (Patient not taking: Reported on 07/16/2016) 30 capsule 0 Not Taking at Unknown time  . feeding supplement, ENSURE ENLIVE, (ENSURE ENLIVE) LIQD Take 237 mLs by mouth 2 (two) times daily between meals. (Patient not taking: Reported on 07/16/2016) 60 Bottle 0 Not  Taking at Unknown time    Family History  Problem Relation Age of Onset  . Aneurysm Mother   . Liver disease Father   . Congestive Heart Failure Brother   . Heart disease Brother   . Colon cancer Neg Hx   . Esophageal cancer Neg Hx   . Pancreatic cancer Neg Hx   . Rectal cancer Neg Hx   . Stomach cancer Neg Hx    Review of Systems:     Cardiac Review of Systems: Y or N  Chest Pain [  N  ]  Resting SOB [ Y  ] Exertional SOB  [Y]    Pedal Edema [  N ]    Palpitations Aqua.Slicker  ] Syncope  Aqua.Slicker  ]   Presyncope [  N ]  General Review of Systems: [Y] =  yes [N  ]=no Constitional:  anorexia [ N ]; nausea [ N ]; night sweats [ N ]; fever [ Y ]; or chills [Y  ]                                                                Eye : blurred vision Aqua.Slicker  ];  Resp: cough [ Y ];  wheezing[N ];  hemoptysis[N  ] GI:  vomiting[N  ];  dysphagia[N  ]; melena[ N ];  hematochezia [ N ];   GU: hematuria[ N ];    Musculosketetal: joint erythema[ N ];  joint pain[ Y-right shoulder ];    Heme/Lymph:  anemia[N  ];  Neuro: stroke[ Y ];  vertigo[ N ];  seizures[ N ];   ;  Psych:depression[Y  ]; anxiety[ Y ];  Endocrine: diabetes[ N ];  thyroid dysfunction[N  ];    Physical Exam: BP 115/65 (BP Location: Right Arm)   Pulse 80   Temp 98.1 F (36.7 C) (Oral)   Resp 19   Ht '5\' 9"'$  (1.753 m)   Wt 63.6 kg (140 lb 3.2 oz) Comment: scale a  SpO2 95%   BMI 20.70 kg/m    General appearance: alert, cooperative and appears older than stated age Head: Normocephalic, without obvious abnormality, atraumatic Neck: Supple, symmetrical, trachea midline Resp: Mostly clear-no wheezing or rhonchi Cardio: IRRR IRRR GI: Soft, mild diffuse tenderness (constipation per patient) bowel sounds present Extremities: No LE edema Neurologic: Grossly normal  Diagnostic Studies & Laboratory data:     Recent Radiology Findings:  CLINICAL DATA:  Leukemia and smoking history. Neck and back pain x1 week with dyspnea and nonproductive  cough.  EXAM: CT ANGIOGRAPHY CHEST WITH CONTRAST  TECHNIQUE: Multidetector CT imaging of the chest was performed using the standard protocol during bolus administration of intravenous contrast. Multiplanar CT image reconstructions and MIPs were obtained to evaluate the vascular anatomy.  CONTRAST:  100 cc Isovue 370 IV  COMPARISON:  03/27/2016  FINDINGS: Cardiovascular: Normal size cardiac chambers without pericardial effusion or thickening. No aortic aneurysm or dissection. No acute pulmonary embolus. Aortic atherosclerosis.  Mediastinum/Nodes: 4 mm left thyroid calcification. Confluent mediastinal and left greater than right hilar soft tissue abnormalities consistent with adenopathy possibly associated with the patient's history of leukemia. Representative left paratracheal soft tissue confluence measures 2.6 cm in thickness with confluent left hilar soft tissue masses measuring 4.6 x 2.5 cm, series 401, image 44. The soft tissue densities narrow the left main pulmonary artery and branches to the lingula and left upper lobe with narrowing but not occlusion. Circumferential narrowing of the proximal pulmonary arteries to the left lower lobe are identified about the left hilum.  Lungs/Pleura: Minimal apical pleuroparenchymal scarring at the right lung apex. 4 mm left lower lobe pulmonary nodule considered present and stable. Mild centrilobular emphysema. Opaque atelectatic appearance of the left upper lobe and lingula secondary to presumed postobstructive change from mediastinal and hilar soft tissue masses consistent with adenopathy. There is extrinsic occlusion of the bronchi to the left upper lobe, lingula and proximal left lower lobe. Trace bilateral pleural effusions with dependent atelectasis, left greater than right. Faint ground-glass opacities in the right lower lobe with areas of adjacent atelectasis and/or scarring. Small pulmonary nodular densities may be  obscured by these opacities. There is mild bronchiectasis to the left lower lobe.  Upper Abdomen: Stable 2.9 cm right adrenal nodule with hypo attenuation similar to prior. Negative left adrenal gland abnormality. The visualized kidneys, spleen, pancreas, stomach and liver demonstrate no acute findings.  Musculoskeletal: No acute nor suspicious osseous lesions. There is mild dextroconvex curvature of the thoracic spine.  Review of the MIP images confirms the above findings.  IMPRESSION: 1. Postobstructive atelectasis involving the lingula and left upper lobe secondary to mediastinal and left hilar masslike abnormalities presumably representing adenopathy in this patient with history of leukemia. Confluent soft tissue masses have increased in appearance and size within the mediastinum and left hilum since prior causing postobstructive change of the left upper lobe, lingular and proximal left lower lobe bronchi. 2. Small left greater than right pleural effusions with atelectatic change. Centrilobular emphysema is stable. 3. Stable hypodense nodule of the right adrenal gland consistent with an adenoma. 4. Centrilobular emphysema. 5. Aortic atherosclerosis without aneurysm. 6. No acute central pulmonary embolus.   Electronically Signed   By: Ashley Royalty M.D.   On: 07/15/2016 18:22   Dg Chest Port 1 View  Result Date: 07/19/2016 CLINICAL DATA:  Two weeks of right-sided chest pain. Former long-term smoker discontinued 2 months ago. EXAM: PORTABLE CHEST 1 VIEW COMPARISON:  Chest x-ray and chest CT scan of July 15, 2016 FINDINGS: The lingular and left upper lobe collapse has progressed. There is pleural fluid versus pleural thickening in the left apex. There is a minimal shift of the mediastinum toward the left. There is patchy density at the right lung base with new small right pleural effusion. The interstitial markings elsewhere in the right lung coarse but stable. The cardiac  silhouette is top-normal in size. The pulmonary vascularity is not clearly engorged there is no pneumothorax. IMPRESSION: Worsening of left upper lobe and lingular atelectasis due to known mediastinal and hilar masses. Developing left basilar atelectasis or infiltrate. Underlying COPD. Electronically Signed   By: David  Martinique M.D.   On: 07/19/2016 07:37      I have independently reviewed the above radiologic studies.  Recent Lab Findings: Lab Results  Component Value Date   WBC 19.8 (H) 07/20/2016   HGB 11.3 (L) 07/20/2016   HCT 38.2 07/20/2016   PLT 92 (L) 07/20/2016   GLUCOSE 100 (H) 07/20/2016   CHOL 107 05/03/2016   ALT 49 07/15/2016   AST 40 07/15/2016   NA 134 (L) 07/20/2016   K 4.7 07/20/2016   CL 93 (L) 07/20/2016   CREATININE 0.55 07/20/2016   BUN 21 (H) 07/20/2016   CO2 33 (H) 07/20/2016   TSH 0.310 (L) 04/30/2016   INR 1.12 07/15/2016      Assessment / Plan:   1. Post obstructive atelectasis, of the lingula and LUL secondary to mediastinal and left hilar mass like abnormalities, lymphadenopathy. Will need bronchoscopy, EBUS, possible mediastinoscopy for diagnosis. 2. Exacerbation of COPD with chronic hypoxic respiratory failure-on home oxygen. On Duoneb and Solumedrol as well. 3. Pneumonia-on Ceftriaxone likely postobstructive  4. Chronic atrial fibrillation-on Cardizem. She was on Apixaban but she stopped this awhile ago because it gave her a rash. She has not had any while in the hospital.  5. CLL-early stage. She is followed by Dr. Benay Spice. Platelet count 92,000 and WBC 19,800 (also on steroids)      Sheri Pinks PA-C 07/20/2016 4:57 PM    New onset of hoarseness for 3 weeks   Xrays reviewed , I  have discussed with patioent repeat bronch ebus poss mediastinoscopy to get tissue dx of presumed lung cancer , likely advanced stage   suspect  At least Stage 3A (cT3, cN2, unknown M) I have seen and examined Sheri Ayala and agree with the above  assessment  and plan. I  spent 25 minutes counseling the patient face to face and 50% or more the  time was spent in counseling and coordination of care. The total time spent in the appointment was 40 minutes . Grace Isaac MD Beeper [phone] Office [phone] 07/21/2016 11:00 AM

## 2016-07-20 NOTE — Progress Notes (Signed)
Patient stable during 7 pm -7 am shift. Complained of constipation, offered to call the doctor for enema, however patient suggested to try one more time with PRN Dulcolax tab. PRN medication given with no effect. Will follow up with day shift nurse for enema. Will continue to monitor.  Gust Eugene, RN

## 2016-07-20 NOTE — Progress Notes (Signed)
Paged pharmacy regarding pt dulcolax scheduled for 1000, pt requested for evening, dose given at 1754. Pharmacy stated medication does not have to be rescheduled. 07/21/2016 AM dose okay to give.  Sheri Ayala Leory Plowman

## 2016-07-21 ENCOUNTER — Inpatient Hospital Stay (HOSPITAL_COMMUNITY): Payer: Medicare HMO

## 2016-07-21 DIAGNOSIS — K5900 Constipation, unspecified: Secondary | ICD-10-CM

## 2016-07-21 DIAGNOSIS — R590 Localized enlarged lymph nodes: Secondary | ICD-10-CM

## 2016-07-21 DIAGNOSIS — J441 Chronic obstructive pulmonary disease with (acute) exacerbation: Secondary | ICD-10-CM

## 2016-07-21 LAB — BASIC METABOLIC PANEL
Anion gap: 9 (ref 5–15)
BUN: 21 mg/dL — AB (ref 6–20)
CALCIUM: 8.7 mg/dL — AB (ref 8.9–10.3)
CO2: 36 mmol/L — AB (ref 22–32)
CREATININE: 0.58 mg/dL (ref 0.44–1.00)
Chloride: 93 mmol/L — ABNORMAL LOW (ref 101–111)
GFR calc Af Amer: >60 mL/min (ref >60)
GFR calc non Af Amer: >60 mL/min (ref >60)
GLUCOSE: 98 mg/dL (ref 65–99)
Potassium: 4.6 mmol/L (ref 3.5–5.1)
Sodium: 138 mmol/L (ref 135–145)

## 2016-07-21 LAB — CBC
HEMATOCRIT: 38.7 % (ref 36.0–46.0)
HEMOGLOBIN: 11.4 g/dL — AB (ref 12.0–15.0)
MCH: 27.9 pg (ref 26.0–34.0)
MCHC: 29.5 g/dL — AB (ref 30.0–36.0)
MCV: 94.6 fL (ref 78.0–100.0)
Platelets: 94 10*3/uL — ABNORMAL LOW (ref 150–400)
RBC: 4.09 MIL/uL (ref 3.87–5.11)
RDW: 15.2 % (ref 11.5–15.5)
WBC: 18.7 10*3/uL — ABNORMAL HIGH (ref 4.0–10.5)

## 2016-07-21 LAB — GLUCOSE, CAPILLARY: GLUCOSE-CAPILLARY: 88 mg/dL (ref 65–99)

## 2016-07-21 MED ORDER — MAGNESIUM HYDROXIDE 400 MG/5ML PO SUSP
30.0000 mL | Freq: Every day | ORAL | Status: DC
  Administered 2016-07-21 – 2016-07-22 (×2): 30 mL via ORAL
  Filled 2016-07-21 (×5): qty 30

## 2016-07-21 NOTE — Progress Notes (Signed)
MD paged regarding pt constipation. Pt states she had a BM today but it was small and she is still constipated. MD aware pt refused mirilax and suppository. MD placed abdominal 1 view.  Tarita Deshmukh Leory Plowman

## 2016-07-21 NOTE — Progress Notes (Signed)
PROGRESS NOTE  Sheri Ayala [id number] DOB: March 24, 1951 DOA: 07/15/2016 PCP: Alvester Chou, NP  Brief Narrative: 66 year old woman PMH COPD on home oxygen presented with progressive exertional dyspnea, cough. Admitted for COPD exacerbation.  Assessment/Plan   1. Acute on chronic hypoxic respiratory failure secondary to COPD exacerbation, sepsis, pneumonia.  COPD exacerbation with chronic hypoxic respiratory failure on 2 L oxygen at home. CT chest negative for pulmonary embolism,. Slowly improving. Continue bronchodilators, supplemental oxygen.   2. Postobstructive atelectasis lingula, left upper lobe secondary to increased hilar adenopathy/masslike abnormalities. Lung mass in 2017 was evaluated with bronchoscopy which was nondiagnostic at that time January 2018. Oncology suspects lung cancer rather than CLL or lymphoma and recommends diagnostic biopsy and further staging, to pursue thoracic surgery evaluation in the outpatient setting, possibly bronchoscopy and mediastinoscopy.Will have bronch/mediastinoscopy Monday per Dr. Servando Snare if still in house. If she goes home over the weekend, contact Dr. Servando Snare at [phone] so that he can cancel OR time and arrange outpatient followup.   3. Sepsis secondary to pneumonia, obstructive atelectasis, hilar adenopathy. Sepsis resolved. Continue empiric antibiotics to complete the course.   4. Hyperkalemia. Treated with Kayexalate, recurrent. Now resolved. Monitor clinically.   5. CLL with associated anemia and thrombocytopenia. Appreciate oncology follow-up.   6. Paroxysmal afib not on anticoagulation. CHA2DS2-VASc 4. Not on anticoagulation secondary to thrombocytopenia and epistaxis.  7. Constipation. Aggressive bowel regimen. Added milk of magnesium and ordered KUB.   8. Aortic atherosclerosis. Asymptomatic.    DVT prophylaxis: SCDs Code Status: full code Family Communication: none Disposition Plan: home when ready, no OT/PT recommended      Consultants:  Oncology  Procedures: None.  Antimicrobials:  Ceftriaxone 4/1 >>  Azithromycin 4/1 >>  Interval history/Subjective: Breathing better, but still wheezing and SOB. No BM x6 days.added mom.   Objective: Vitals:   07/21/16 0527 07/21/16 1000 07/21/16 1200 07/21/16 1330  BP: 123/61 114/71 119/73   Pulse: 77 70 76   Resp: '18 18 18   '$ Temp: 97.6 F (36.4 C)  98.2 F (36.8 C)   TempSrc: Oral  Oral   SpO2: 97%  94% 95%  Weight: 63 kg (138 lb 12.8 oz)     Height:        Intake/Output Summary (Last 24 hours) at 07/21/16 1438 Last data filed at 07/21/16 1200  Gross per 24 hour  Intake              750 ml  Output                0 ml  Net              750 ml     Filed Weights   07/19/16 0643 07/20/16 0444 07/21/16 0527  Weight: 63.3 kg (139 lb 8 oz) 63.6 kg (140 lb 3.2 oz) 63 kg (138 lb 12.8 oz)    Exam:    Constitutional: Appears calm, comfortable Respiratory:  Bilateral scattered wheezing. Good air entry.  Cardiovascular: Regular rate and rhythm. No murmur, rub or gallop. No lower extremity edema.  Abdomen:  Soft mildly distended, slightly tender.  Psychiatric: Grossly normal mood and affect. Speech fluent and appropriate.   I have personally reviewed following labs and imaging studies:    Scheduled Meds: . azithromycin  500 mg Oral QHS  . bisacodyl  10 mg Rectal Daily  . cefTRIAXone (ROCEPHIN)  IV  1 g Intravenous Q24H  . cyclobenzaprine  5 mg Oral TID  . diltiazem  180 mg  Oral Daily  . feeding supplement (ENSURE ENLIVE)  237 mL Oral BID BM  . ipratropium-albuterol  3 mL Nebulization TID  . magnesium hydroxide  30 mL Oral Daily  . mouth rinse  15 mL Mouth Rinse BID  . methylPREDNISolone (SOLU-MEDROL) injection  40 mg Intravenous Daily  . mometasone-formoterol  2 puff Inhalation BID  . polyethylene glycol  17 g Oral TID  . senna  1 tablet Oral Daily  . sertraline  25 mg Oral Daily  . sodium chloride flush  3 mL Intravenous Q12H  .  sodium chloride flush  3 mL Intravenous Q12H  . temazepam  30 mg Oral QHS   Continuous Infusions:  Principal Problem:   Acute on chronic respiratory failure with hypoxia (HCC) Active Problems:   CLL (chronic lymphocytic leukemia) (HCC)   Thrombocytopenia (HCC)   COPD with acute exacerbation (HCC)   Chronic a-fib (HCC)   History of CVA (cerebrovascular accident)   Obstructive atelectasis   Leukocytosis   Lobar pneumonia (HCC)   Hilar adenopathy   LOS: 6 days   Hosie Poisson , MD  [facility] [phone] --Via amion app OR  --[email or web address]; password [email or web address]  7PM-7AM contact night coverage as above  07/21/2016, 2:38 PM  LOS: 6 days

## 2016-07-21 NOTE — Progress Notes (Signed)
Pt requesting to shower, stopped MD in hallway and she agreed. Placed verbal order  Baylei Siebels Leory Plowman

## 2016-07-21 NOTE — Progress Notes (Signed)
Pt refused Mirilax and suppository, stated "it doesn't work anyway." Pt educated on medication and pt still refused. Will paged MD for further treatment of pt constipation  Sheri Ayala

## 2016-07-21 NOTE — Progress Notes (Signed)
Patient with no complaints or concerns during 7pm - 7am shift. Slept during the night.   Jashawna Reever, RN 

## 2016-07-22 MED ORDER — SENNA 8.6 MG PO TABS
1.0000 | ORAL_TABLET | Freq: Two times a day (BID) | ORAL | Status: DC
  Administered 2016-07-22 – 2016-07-26 (×6): 8.6 mg via ORAL
  Filled 2016-07-22 (×8): qty 1

## 2016-07-22 NOTE — Progress Notes (Signed)
[facility] PROGRESS NOTE  Sheri Ayala [id number] DOB: [birth year] DOA: 07/15/2016  PCP: Alvester Chou, NP  Brief History/Interval Summary: 66 year old Caucasian female with a past medical history of COPD on home oxygen, CLL, atrial fibrillation not on eliquis anymore due to thrombocytopenia and epistaxis, history of stroke, anxiety and insomnia, presented with progressively worsening shortness of breath. She was found to have COPD exacerbation and was hospitalized. There was also concern for pneumonia and the patient was given antibiotics. CT scan also showed increased hilar adenopathy and masslike abnormalities. Concern is for lung cancer. Patient to undergo bronchoscopy and mediastinoscopy.  Reason for Visit: COPD exacerbation. Possible lung mass.  Consultants: Oncology. Cardiothoracic surgery.  Procedures: None.  Antibiotics: Ceftriaxone and azithromycin  Subjective/Interval History: Patient feels well this morning. Dry cough is present. Muscles of breath is improved. Occasional wheezing. Denies any nausea or vomiting.  ROS: Denies any chest pain  Objective:  Vital Signs  Vitals:   07/21/16 2011 07/22/16 0610 07/22/16 0732 07/22/16 0735  BP:  (!) 102/58    Pulse:  80    Resp:  18    Temp:  98.2 F (36.8 C)    TempSrc:  Oral    SpO2: 95% 94% 94% 95%  Weight:  62.1 kg (137 lb)    Height:        Intake/Output Summary (Last 24 hours) at 07/22/16 1004 Last data filed at 07/22/16 0700  Gross per 24 hour  Intake              650 ml  Output              300 ml  Net              350 ml   Filed Weights   07/20/16 0444 07/21/16 0527 07/22/16 0610  Weight: 63.6 kg (140 lb 3.2 oz) 63 kg (138 lb 12.8 oz) 62.1 kg (137 lb)    General appearance: alert, cooperative, appears stated age and no distress Head: Normocephalic, without obvious abnormality, atraumatic Resp: Coarse breath sounds bilaterally. Few scattered wheezes. Few crackles at the right base. No rhonchi.  Normal effort Cardio: regular rate and rhythm, S1, S2 normal, no murmur, click, rub or gallop GI: soft, non-tender; bowel sounds normal; no masses,  no organomegaly Extremities: extremities normal, atraumatic, no cyanosis or edema Neurologic: Awake and alert. Oriented x 3. No focal neurological deficits are present.  Lab Results:  Data Reviewed: I have personally reviewed following labs and imaging studies  CBC:  Recent Labs Lab 07/15/16 1330 07/17/16 0432 07/18/16 0504 07/19/16 0407 07/20/16 0404 07/21/16 0319  WBC 20.9* 23.7* 16.5* 27.1* 19.8* 18.7*  NEUTROABS 9.8*  --   --   --   --   --   HGB 13.4 11.9* 11.4* 11.7* 11.3* 11.4*  HCT 43.7 38.3 36.8 39.0 38.2 38.7  MCV 93.2 93.2 93.4 94.0 94.6 94.6  PLT 133* 140* 111* 113* 92* 94*    Basic Metabolic Panel:  Recent Labs Lab 07/17/16 0432 07/18/16 0504 07/19/16 0407 07/20/16 0404 07/21/16 0319  NA 133* 136 134* 134* 138  K 5.6* 4.7 5.6* 4.7 4.6  CL 99* 99* 97* 93* 93*  CO2 '26 29 29 '$ 33* 36*  GLUCOSE 134* 127* 108* 100* 98  BUN 32* 30* 27* 21* 21*  CREATININE 0.75 0.64 0.63 0.55 0.58  CALCIUM 8.7* 8.8* 8.7* 8.4* 8.7*    GFR: Estimated Creatinine Clearance: 67.8 mL/min (by C-G formula based on SCr of 0.58 mg/dL).  Liver Function Tests:  Recent Labs Lab 07/15/16 1330  AST 40  ALT 49  ALKPHOS 68  BILITOT 0.6  PROT 5.6*  ALBUMIN 3.7    Coagulation Profile:  Recent Labs Lab 07/15/16 1330  INR 1.12    CBG:  Recent Labs Lab 07/17/16 0652 07/18/16 0600 07/19/16 0459 07/20/16 0534 07/21/16 0525  GLUCAP 134* 117* 100* 92 88    Radiology Studies: Dg Abd Portable 1v  Result Date: 07/21/2016 CLINICAL DATA:  Constipation for 6 days. EXAM: PORTABLE ABDOMEN - 1 VIEW COMPARISON:  None. FINDINGS: Mild fecal loading in the colon with no bowel obstruction or other acute abnormality. IMPRESSION: Mild fecal loading in the colon. Electronically Signed   By: Dorise Bullion III M.D   On: 07/21/2016 11:30      Medications:  Scheduled: . azithromycin  500 mg Oral QHS  . bisacodyl  10 mg Rectal Daily  . cefTRIAXone (ROCEPHIN)  IV  1 g Intravenous Q24H  . cyclobenzaprine  5 mg Oral TID  . diltiazem  180 mg Oral Daily  . feeding supplement (ENSURE ENLIVE)  237 mL Oral BID BM  . ipratropium-albuterol  3 mL Nebulization TID  . magnesium hydroxide  30 mL Oral Daily  . mouth rinse  15 mL Mouth Rinse BID  . methylPREDNISolone (SOLU-MEDROL) injection  40 mg Intravenous Daily  . mometasone-formoterol  2 puff Inhalation BID  . polyethylene glycol  17 g Oral TID  . senna  1 tablet Oral BID  . sertraline  25 mg Oral Daily  . sodium chloride flush  3 mL Intravenous Q12H  . sodium chloride flush  3 mL Intravenous Q12H  . temazepam  30 mg Oral QHS   Continuous:  LZJ:QBHALP chloride, acetaminophen **OR** acetaminophen, albuterol, ALPRAZolam, diphenhydrAMINE, guaiFENesin-dextromethorphan, ondansetron **OR** ondansetron (ZOFRAN) IV, sodium chloride flush  Assessment/Plan:  Principal Problem:   Acute on chronic respiratory failure with hypoxia (HCC) Active Problems:   CLL (chronic lymphocytic leukemia) (HCC)   Thrombocytopenia (HCC)   COPD with acute exacerbation (HCC)   Chronic a-fib (HCC)   History of CVA (cerebrovascular accident)   Obstructive atelectasis   Leukocytosis   Lobar pneumonia (HCC)   Hilar adenopathy    Acute on chronic hypoxic respiratory failure secondary to COPD exacerbation, sepsis, pneumonia Respiratory failure has resolved. Continue treatment as outlined below.  Acute COPD exacerbation with chronic hypoxic respiratory failure on 2 L oxygen at home CT chest negative for pulmonary embolism,. Slowly improving. Continue bronchodilators, supplemental oxygen. Still on IV steroids, which will be continued till tomorrow. And then it can be tapered down orally.  Postobstructive atelectasis lingula, left upper lobe secondary to increased hilar adenopathy/masslike  abnormalities Lung mass in 2017 was evaluated with bronchoscopy which was nondiagnostic at that time January 2018. Oncology suspects lung cancer rather than CLL or lymphoma and recommends diagnostic biopsy and further staging. Cardiothoracic surgery was consulted. Plan is for bronchoscopy and mediastinoscopy on Monday.  Sepsis secondary to pneumonia, obstructive atelectasis Sepsis resolved. Continue empiric antibiotics to complete the course. She will complete 7 days of antibiotics today. CT scan of the chest report reviewed. No clear infiltrate was identified. Symptoms have resolved. Will stop azithromycin after tonight. We'll stop ceftriaxone after tomorrow..  Hyperkalemia Treated with Kayexalate, recurrent. Now resolved. Monitor clinically.  CLL with associated anemia and thrombocytopenia Stable. Appreciate oncology follow-up.  Paroxysmal afib not on anticoagulation CHA2DS2-VASc 4. Not on anticoagulation secondary to thrombocytopenia and epistaxis.  Constipation. Aggressive bowel regimen. X-ray did not show any acute  changes. TSH was checked in January and was noted to be low but free T4 was normal.  Aortic atherosclerosis Asymptomatic.  DVT Prophylaxis: SCDs    Code Status: Full code  Family Communication: Discussed with the patient  Disposition Plan: Management as outlined above. Mobilize as tolerated.    LOS: 7 days   [facility] Pager [phone] 07/22/2016, 10:04 AM  If 7PM-7AM, please contact night-coverage at [email or web address], password [facility]

## 2016-07-22 NOTE — Progress Notes (Signed)
      [address]       [place] [address]             [phone]     Procedure(s) (LRB): VIDEO BRONCHOSCOPY WITH ENDOBRONCHIAL ULTRASOUND (N/A) MEDIASTINOSCOPY (N/A) Subjective: Says breathing better today  Objective: Vital signs in last 24 hours: Temp:  [98.2 F (36.8 C)-98.4 F (36.9 C)] 98.2 F (36.8 C) (04/08 0610) Pulse Rate:  [68-94] 80 (04/08 0610) Resp:  [18-19] 18 (04/08 0610) BP: (102-122)/(58-76) 102/58 (04/08 0610) SpO2:  [94 %-95 %] 95 % (04/08 0735) Weight:  [137 lb (62.1 kg)] 137 lb (62.1 kg) (04/08 0610)  Hemodynamic parameters for last 24 hours:    Intake/Output from previous day: 04/07 0701 - 04/08 0700 In: 71 [P.O.:840; IV Piggyback:50] Out: 300 [Urine:300] Intake/Output this shift: Total I/O In: 240 [P.O.:240] Out: -   General appearance: alert, cooperative and appears older than stated age Neurologic: intact and horseness  Heart: regular rate and rhythm, S1, S2 normal, no murmur, click, rub or gallop Lungs: diminished breath sounds bibasilar Abdomen: soft, non-tender; bowel sounds normal; no masses,  no organomegaly Extremities: extremities normal, atraumatic, no cyanosis or edema and Homans sign is negative, no sign of DVT  Lab Results:  Recent Labs  07/20/16 0404 07/21/16 0319  WBC 19.8* 18.7*  HGB 11.3* 11.4*  HCT 38.2 38.7  PLT 92* 94*   BMET:  Recent Labs  07/20/16 0404 07/21/16 0319  NA 134* 138  K 4.7 4.6  CL 93* 93*  CO2 33* 36*  GLUCOSE 100* 98  BUN 21* 21*  CREATININE 0.55 0.58  CALCIUM 8.4* 8.7*    PT/INR: No results for input(s): LABPROT, INR in the last 72 hours. ABG    Component Value Date/Time   PHART 7.312 (L) 04/18/2016 1602   HCO3 28.8 (H) 04/18/2016 1602   O2SAT 93.1 04/18/2016 1602   CBG (last 3)   Recent Labs  07/20/16 0534 07/21/16 0525  GLUCAP 92 88    Assessment/Plan: S/P Procedure(s) (LRB): VIDEO BRONCHOSCOPY WITH ENDOBRONCHIAL ULTRASOUND (N/A) MEDIASTINOSCOPY  (N/A) Plan repeat bronch, EBUS possible mediastinoscopy in am to get tissue dx of what appears to be advanced stage lung cancer  The goals risks and alternatives of the planned surgical procedure Procedure(s): VIDEO BRONCHOSCOPY WITH ENDOBRONCHIAL ULTRASOUND (N/A) MEDIASTINOSCOPY (N/A)  have been discussed with the patient in detail. The risks of the procedure including death, infection, stroke, myocardial infarction, bleeding, blood transfusion have all been discussed specifically.  I have quoted Sheri Ayala a 2 % of perioperative mortality and a complication rate as high as 30 %. The patient's questions have been answered.Sheri Ayala is willing  to proceed with the planned procedure.    LOS: 7 days    Grace Isaac 07/22/2016

## 2016-07-23 ENCOUNTER — Encounter (HOSPITAL_COMMUNITY): Payer: Self-pay | Admitting: Certified Registered Nurse Anesthetist

## 2016-07-23 ENCOUNTER — Inpatient Hospital Stay (HOSPITAL_COMMUNITY): Payer: Medicare HMO

## 2016-07-23 ENCOUNTER — Encounter (HOSPITAL_COMMUNITY)
Admission: EM | Disposition: A | Discharge status: Home Health | Payer: Self-pay | Source: Home / Self Care | Attending: Internal Medicine

## 2016-07-23 ENCOUNTER — Inpatient Hospital Stay (HOSPITAL_COMMUNITY): Payer: Medicare HMO | Admitting: Certified Registered Nurse Anesthetist

## 2016-07-23 HISTORY — PX: VIDEO BRONCHOSCOPY WITH ENDOBRONCHIAL ULTRASOUND: SHX6177

## 2016-07-23 LAB — CBC
HEMATOCRIT: 39.1 % (ref 36.0–46.0)
Hemoglobin: 11.7 g/dL — ABNORMAL LOW (ref 12.0–15.0)
MCH: 28.1 pg (ref 26.0–34.0)
MCHC: 29.9 g/dL — AB (ref 30.0–36.0)
MCV: 94 fL (ref 78.0–100.0)
PLATELETS: 84 10*3/uL — AB (ref 150–400)
RBC: 4.16 MIL/uL (ref 3.87–5.11)
RDW: 15.3 % (ref 11.5–15.5)
WBC: 20.5 10*3/uL — AB (ref 4.0–10.5)

## 2016-07-23 LAB — COMPREHENSIVE METABOLIC PANEL
ALT: 122 U/L — ABNORMAL HIGH (ref 14–54)
AST: 48 U/L — ABNORMAL HIGH (ref 15–41)
Albumin: 3.4 g/dL — ABNORMAL LOW (ref 3.5–5.0)
Alkaline Phosphatase: 72 U/L (ref 38–126)
Anion gap: 10 (ref 5–15)
BUN: 23 mg/dL — ABNORMAL HIGH (ref 6–20)
CO2: 34 mmol/L — ABNORMAL HIGH (ref 22–32)
Calcium: 9 mg/dL (ref 8.9–10.3)
Chloride: 93 mmol/L — ABNORMAL LOW (ref 101–111)
Creatinine, Ser: 0.63 mg/dL (ref 0.44–1.00)
GFR calc Af Amer: >60 mL/min (ref >60)
GFR calc non Af Amer: >60 mL/min (ref >60)
Glucose, Bld: 104 mg/dL — ABNORMAL HIGH (ref 65–99)
Potassium: 5.2 mmol/L — ABNORMAL HIGH (ref 3.5–5.1)
Sodium: 137 mmol/L (ref 135–145)
Total Bilirubin: 0.5 mg/dL (ref 0.3–1.2)
Total Protein: 5.1 g/dL — ABNORMAL LOW (ref 6.5–8.1)

## 2016-07-23 LAB — TYPE AND SCREEN
ABO/RH(D): A POS
Antibody Screen: NEGATIVE

## 2016-07-23 LAB — GLUCOSE, CAPILLARY: Glucose-Capillary: 84 mg/dL (ref 65–99)

## 2016-07-23 LAB — SURGICAL PCR SCREEN
MRSA, PCR: NEGATIVE
Staphylococcus aureus: NEGATIVE

## 2016-07-23 LAB — PROTIME-INR
INR: 1.14
Prothrombin Time: 14.7 seconds (ref 11.4–15.2)

## 2016-07-23 LAB — APTT: aPTT: 24 seconds (ref 24–36)

## 2016-07-23 LAB — ABO/RH: ABO/RH(D): A POS

## 2016-07-23 SURGERY — BRONCHOSCOPY, WITH EBUS
Anesthesia: General | Site: Bronchus

## 2016-07-23 MED ORDER — CHLORHEXIDINE GLUCONATE CLOTH 2 % EX PADS
6.0000 | MEDICATED_PAD | Freq: Once | CUTANEOUS | Status: AC
  Administered 2016-07-23: 6 via TOPICAL

## 2016-07-23 MED ORDER — MIDAZOLAM HCL 2 MG/2ML IJ SOLN
INTRAMUSCULAR | Status: AC
  Filled 2016-07-23: qty 2

## 2016-07-23 MED ORDER — LIDOCAINE 2% (20 MG/ML) 5 ML SYRINGE
INTRAMUSCULAR | Status: AC
  Filled 2016-07-23: qty 5

## 2016-07-23 MED ORDER — ROCURONIUM BROMIDE 10 MG/ML (PF) SYRINGE
PREFILLED_SYRINGE | INTRAVENOUS | Status: DC | PRN
  Administered 2016-07-23: 50 mg via INTRAVENOUS

## 2016-07-23 MED ORDER — EPINEPHRINE PF 1 MG/ML IJ SOLN
INTRAMUSCULAR | Status: AC
  Filled 2016-07-23: qty 2

## 2016-07-23 MED ORDER — OXYCODONE HCL 5 MG PO TABS: 5.0000 mg | ORAL_TABLET | Freq: Once | ORAL | Status: DC | PRN

## 2016-07-23 MED ORDER — LACTATED RINGERS IV SOLN
INTRAVENOUS | Status: DC | PRN
  Administered 2016-07-23: 10:00:00 via INTRAVENOUS

## 2016-07-23 MED ORDER — DEXTROSE 5 % IV SOLN
1.5000 g | INTRAVENOUS | Status: AC
  Administered 2016-07-23: 1.5 g via INTRAVENOUS
  Filled 2016-07-23 (×2): qty 1.5

## 2016-07-23 MED ORDER — SUGAMMADEX SODIUM 200 MG/2ML IV SOLN
INTRAVENOUS | Status: AC
  Filled 2016-07-23: qty 2

## 2016-07-23 MED ORDER — PREDNISONE 20 MG PO TABS
40.0000 mg | ORAL_TABLET | Freq: Every day | ORAL | Status: DC
  Administered 2016-07-24 – 2016-07-26 (×3): 40 mg via ORAL
  Filled 2016-07-23 (×4): qty 2

## 2016-07-23 MED ORDER — FENTANYL CITRATE (PF) 100 MCG/2ML IJ SOLN: 25.0000 ug | INTRAMUSCULAR | Status: DC | PRN

## 2016-07-23 MED ORDER — SUGAMMADEX SODIUM 200 MG/2ML IV SOLN
INTRAVENOUS | Status: DC | PRN
  Administered 2016-07-23: 120 mg via INTRAVENOUS

## 2016-07-23 MED ORDER — LIDOCAINE 2% (20 MG/ML) 5 ML SYRINGE
INTRAMUSCULAR | Status: DC | PRN
  Administered 2016-07-23: 40 mg via INTRAVENOUS

## 2016-07-23 MED ORDER — OXYCODONE HCL 5 MG/5ML PO SOLN: 5.0000 mg | Freq: Once | ORAL | Status: DC | PRN

## 2016-07-23 MED ORDER — PROPOFOL 10 MG/ML IV BOLUS
INTRAVENOUS | Status: DC | PRN
Start: 2016-07-23 — End: 2016-07-23
  Administered 2016-07-23: 20 mg via INTRAVENOUS
  Administered 2016-07-23: 30 mg via INTRAVENOUS
  Administered 2016-07-23: 10 mg via INTRAVENOUS
  Administered 2016-07-23: 120 mg via INTRAVENOUS

## 2016-07-23 MED ORDER — PHENYLEPHRINE HCL 10 MG/ML IJ SOLN
INTRAVENOUS | Status: DC | PRN
  Administered 2016-07-23: 20 ug/min via INTRAVENOUS

## 2016-07-23 MED ORDER — ONDANSETRON HCL 4 MG/2ML IJ SOLN
INTRAMUSCULAR | Status: AC
  Filled 2016-07-23: qty 2

## 2016-07-23 MED ORDER — PROPOFOL 10 MG/ML IV BOLUS
INTRAVENOUS | Status: AC
  Filled 2016-07-23: qty 20

## 2016-07-23 MED ORDER — MIDAZOLAM HCL 5 MG/5ML IJ SOLN
INTRAMUSCULAR | Status: DC | PRN
  Administered 2016-07-23: 1 mg via INTRAVENOUS

## 2016-07-23 MED ORDER — FENTANYL CITRATE (PF) 100 MCG/2ML IJ SOLN
INTRAMUSCULAR | Status: DC | PRN
  Administered 2016-07-23 (×2): 25 ug via INTRAVENOUS
  Administered 2016-07-23: 100 ug via INTRAVENOUS

## 2016-07-23 MED ORDER — CHLORHEXIDINE GLUCONATE CLOTH 2 % EX PADS: 6.0000 | MEDICATED_PAD | Freq: Once | CUTANEOUS | Status: DC

## 2016-07-23 MED ORDER — ALBUTEROL SULFATE (2.5 MG/3ML) 0.083% IN NEBU
INHALATION_SOLUTION | RESPIRATORY_TRACT | Status: AC
  Filled 2016-07-23: qty 3

## 2016-07-23 MED ORDER — IPRATROPIUM-ALBUTEROL 0.5-2.5 (3) MG/3ML IN SOLN
3.0000 mL | Freq: Two times a day (BID) | RESPIRATORY_TRACT | Status: DC
  Administered 2016-07-23 – 2016-07-27 (×9): 3 mL via RESPIRATORY_TRACT
  Filled 2016-07-23 (×9): qty 3

## 2016-07-23 MED ORDER — ONDANSETRON HCL 4 MG/2ML IJ SOLN: 4.0000 mg | Freq: Once | INTRAMUSCULAR | Status: DC | PRN

## 2016-07-23 MED ORDER — 0.9 % SODIUM CHLORIDE (POUR BTL) OPTIME
TOPICAL | Status: DC | PRN
  Administered 2016-07-23: 1000 mL

## 2016-07-23 MED ORDER — FENTANYL CITRATE (PF) 250 MCG/5ML IJ SOLN
INTRAMUSCULAR | Status: AC
  Filled 2016-07-23: qty 5

## 2016-07-23 SURGICAL SUPPLY — 55 items
ADH SKN CLS APL DERMABOND .7 (GAUZE/BANDAGES/DRESSINGS) ×2
BLADE SURG 10 STRL SS (BLADE) ×3 IMPLANT
BRUSH CYTOL CELLEBRITY 1.5X140 (MISCELLANEOUS) ×2 IMPLANT
CANISTER SUCT 3000ML PPV (MISCELLANEOUS) ×6 IMPLANT
CLIP TI MEDIUM 6 (CLIP) IMPLANT
CONT SPEC 4OZ CLIKSEAL STRL BL (MISCELLANEOUS) ×9 IMPLANT
COVER BACK TABLE 60X90IN (DRAPES) ×3 IMPLANT
COVER DOME SNAP 22 D (MISCELLANEOUS) ×3 IMPLANT
COVER SURGICAL LIGHT HANDLE (MISCELLANEOUS) ×6 IMPLANT
DERMABOND ADVANCED (GAUZE/BANDAGES/DRESSINGS) ×1
DERMABOND ADVANCED .7 DNX12 (GAUZE/BANDAGES/DRESSINGS) ×2 IMPLANT
DRAPE LAPAROTOMY T 102X78X121 (DRAPES) ×3 IMPLANT
DRSG AQUACEL AG ADV 3.5X14 (GAUZE/BANDAGES/DRESSINGS) ×3 IMPLANT
ELECT CAUTERY BLADE 6.4 (BLADE) ×3 IMPLANT
ELECT REM PT RETURN 9FT ADLT (ELECTROSURGICAL) ×3
ELECTRODE REM PT RTRN 9FT ADLT (ELECTROSURGICAL) ×2 IMPLANT
FORCEPS BIOP RJ4 1.8 (CUTTING FORCEPS) IMPLANT
GAUZE SPONGE 4X4 12PLY STRL (GAUZE/BANDAGES/DRESSINGS) ×6 IMPLANT
GAUZE SPONGE 4X4 16PLY XRAY LF (GAUZE/BANDAGES/DRESSINGS) ×3 IMPLANT
GLOVE BIO SURGEON STRL SZ 6.5 (GLOVE) ×6 IMPLANT
GOWN STRL REUS W/ TWL LRG LVL3 (GOWN DISPOSABLE) ×2 IMPLANT
GOWN STRL REUS W/TWL LRG LVL3 (GOWN DISPOSABLE) ×3
HEMOSTAT SURGICEL 2X14 (HEMOSTASIS) IMPLANT
KIT BASIN OR (CUSTOM PROCEDURE TRAY) ×3 IMPLANT
KIT CLEAN ENDO COMPLIANCE (KITS) ×9 IMPLANT
KIT ROOM TURNOVER OR (KITS) ×6 IMPLANT
MARKER SKIN DUAL TIP RULER LAB (MISCELLANEOUS) ×3 IMPLANT
NDL BIOPSY TRANSBRONCH 21G (NEEDLE) IMPLANT
NDL BLUNT 18X1 FOR OR ONLY (NEEDLE) IMPLANT
NDL EBUS SONO TIP PENTAX (NEEDLE) ×1 IMPLANT
NEEDLE BIOPSY TRANSBRONCH 21G (NEEDLE) IMPLANT
NEEDLE BLUNT 18X1 FOR OR ONLY (NEEDLE) IMPLANT
NEEDLE EBUS SONO TIP PENTAX (NEEDLE) ×3 IMPLANT
NS IRRIG 1000ML POUR BTL (IV SOLUTION) ×6 IMPLANT
OIL SILICONE PENTAX (PARTS (SERVICE/REPAIRS)) ×3 IMPLANT
PACK SURGICAL SETUP 50X90 (CUSTOM PROCEDURE TRAY) ×3 IMPLANT
PAD ARMBOARD 7.5X6 YLW CONV (MISCELLANEOUS) ×12 IMPLANT
PENCIL BUTTON HOLSTER BLD 10FT (ELECTRODE) ×3 IMPLANT
SPONGE INTESTINAL PEANUT (DISPOSABLE) IMPLANT
STAPLER VISISTAT 35W (STAPLE) IMPLANT
SUT VIC AB 3-0 SH 18 (SUTURE) ×3 IMPLANT
SUT VICRYL 4-0 PS2 18IN ABS (SUTURE) ×3 IMPLANT
SWAB COLLECTION DEVICE MRSA (MISCELLANEOUS) IMPLANT
SWAB CULTURE ESWAB REG 1ML (MISCELLANEOUS) IMPLANT
SYR 10ML LL (SYRINGE) ×3 IMPLANT
SYR 20CC LL (SYRINGE) ×3 IMPLANT
SYR 20ML ECCENTRIC (SYRINGE) ×3 IMPLANT
TOWEL GREEN STERILE (TOWEL DISPOSABLE) ×6 IMPLANT
TOWEL GREEN STERILE FF (TOWEL DISPOSABLE) ×6 IMPLANT
TOWEL OR 17X24 6PK STRL BLUE (TOWEL DISPOSABLE) ×6 IMPLANT
TOWEL OR 17X26 10 PK STRL BLUE (TOWEL DISPOSABLE) ×3 IMPLANT
TRAP SPECIMEN MUCOUS 40CC (MISCELLANEOUS) ×3 IMPLANT
TUBE CONNECTING 12X1/4 (SUCTIONS) ×3 IMPLANT
TUBE CONNECTING 20X1/4 (TUBING) ×3 IMPLANT
WATER STERILE IRR 1000ML POUR (IV SOLUTION) ×6 IMPLANT

## 2016-07-23 NOTE — Evaluation (Signed)
Physical Therapy Evaluation Patient Details Name: Sheri Ayala MRN: [id number] DOB: April 06, 1951 Today's Date: 07/23/2016   History of Present Illness  Sheri Ayala is a 66 y.o. female with PMH significant for COPD (on chronic oxygen supplementation, CLL, tobacco abuse , depression, anxiety, left lung mass and hx of CVA (w/o residual deficit); presented with worsening SOB. Pt found to have PNA and left lung masses. Pt with COPD exacerbation. Pt found to have stage III lung CA.  Clinical Impression  Pt doing well with mobility and no further PT needed.  Ready for dc from PT standpoint. Pt with SpO2 80% after amb on 4L. Recovered to 90% after 60-90 sec. Pt has home O2 already.      Follow Up Recommendations No PT follow up    Equipment Recommendations  None recommended by PT    Recommendations for Other Services       Precautions / Restrictions Precautions Precautions: None Restrictions Weight Bearing Restrictions: No      Mobility  Bed Mobility Overal bed mobility: Independent                Transfers Overall transfer level: Modified independent Equipment used: None Transfers: Sit to/from Stand Sit to Stand: Modified independent (Device/Increase time)            Ambulation/Gait Ambulation/Gait assistance: Modified independent (Device/Increase time) Ambulation Distance (Feet): 400 Feet Assistive device: None Gait Pattern/deviations: Step-through pattern;Drifts right/left   Gait velocity interpretation: at or above normal speed for age/gender General Gait Details: Gait steady with slight drifting. Pt amb on 4L of O2 with SpO2 80% after amb. 60-90 sec seated rest to return SpO2 to 90%  Stairs            Wheelchair Mobility    Modified Rankin (Stroke Patients Only)       Balance Overall balance assessment: Modified Independent                                           Pertinent Vitals/Pain Pain Assessment: No/denies pain     Home Living Family/patient expects to be discharged to:: Private residence Living Arrangements: Alone Available Help at Discharge: Neighbor;Available 24 hours/day;Family Type of Home: Mobile home Home Access: Stairs to enter Entrance Stairs-Rails: Can reach both Entrance Stairs-Number of Steps: 2 Home Layout: One level Home Equipment: Cane - single point;Grab bars - tub/shower Additional Comments: Pt to stay with son initially    Prior Function Level of Independence: Independent         Comments: intermittent use of SPC with severe SOB. son does driving and grocery shopping      Hand Dominance   Dominant Hand: Right    Extremity/Trunk Assessment   Upper Extremity Assessment Upper Extremity Assessment: Overall WFL for tasks assessed    Lower Extremity Assessment Lower Extremity Assessment: Overall WFL for tasks assessed    Cervical / Trunk Assessment Cervical / Trunk Assessment: Kyphotic  Communication   Communication: No difficulties  Cognition Arousal/Alertness: Awake/alert Behavior During Therapy: WFL for tasks assessed/performed Overall Cognitive Status: Within Functional Limits for tasks assessed                                        General Comments      Exercises     Assessment/Plan  PT Assessment Patent does not need any further PT services  PT Problem List         PT Treatment Interventions      PT Goals (Current goals can be found in the Care Plan section)  Acute Rehab PT Goals PT Goal Formulation: All assessment and education complete, DC therapy    Frequency     Barriers to discharge        Co-evaluation               End of Session Equipment Utilized During Treatment: Oxygen Activity Tolerance: Patient tolerated treatment well Patient left: with call bell/phone within reach;in bed;with family/visitor present Nurse Communication: Mobility status;Other (comment) (SpO2) PT Visit Diagnosis: Difficulty  in walking, not elsewhere classified (R26.2)    Time: 3546-5681 PT Time Calculation (min) (ACUTE ONLY): 14 min   Charges:   PT Evaluation $PT Eval Low Complexity: 1 Procedure     PT G CodesMarland Kitchen        [facility] [place] 07/23/2016, 4:17 PM

## 2016-07-23 NOTE — Progress Notes (Signed)
Patient down for xray

## 2016-07-23 NOTE — Transfer of Care (Signed)
Immediate Anesthesia Transfer of Care Note  Patient: Sheri Ayala  Procedure(s) Performed: Procedure(s): VIDEO BRONCHOSCOPY WITH ENDOBRONCHIAL ULTRASOUND (N/A)  Patient Location: PACU  Anesthesia Type:General  Level of Consciousness: awake, alert  and oriented  Airway & Oxygen Therapy: Patient Spontanous Breathing and Patient connected to face mask oxygen  Post-op Assessment: Report given to RN, Post -op Vital signs reviewed and stable and Patient moving all extremities X 4  Post vital signs: Reviewed and stable  Last Vitals:  Vitals:   07/23/16 0615 07/23/16 1207  BP: 114/63 113/76  Pulse: 75 (!) 112  Resp: 18 (!) 21  Temp: 36.7 C 36.8 C    Last Pain:  Vitals:   07/23/16 0615  TempSrc: Oral  PainSc:       Patients Stated Pain Goal: 0 (34/19/62 2297)  Complications: No apparent anesthesia complications

## 2016-07-23 NOTE — Anesthesia Postprocedure Evaluation (Signed)
Anesthesia Post Note  Patient: Sheri Ayala  Procedure(s) Performed: Procedure(s) (LRB): VIDEO BRONCHOSCOPY WITH ENDOBRONCHIAL ULTRASOUND (N/A)  Patient location during evaluation: PACU Anesthesia Type: General Level of consciousness: awake Pain management: pain level controlled Vital Signs Assessment: post-procedure vital signs reviewed and stable Respiratory status: spontaneous breathing Cardiovascular status: stable Anesthetic complications: no       Last Vitals:  Vitals:   07/23/16 1207 07/23/16 1220  BP: 113/76 113/73  Pulse: (!) 112 (!) 104  Resp: (!) 21 15  Temp: 36.8 C     Last Pain:  Vitals:   07/23/16 1207  TempSrc:   PainSc: 0-No pain                 Quamesha Mullet

## 2016-07-23 NOTE — Care Management Important Message (Signed)
Important Message  Patient Details  Name: Sheri Ayala MRN: [id number] Date of Birth: [birth year]   Medicare Important Message Given:  Yes    Rhett Najera [address]ine Circle 07/23/2016, 12:30 PM

## 2016-07-23 NOTE — Progress Notes (Signed)
Pre Procedure note for inpatients:   Sheri Ayala has been scheduled for Procedure(s): VIDEO BRONCHOSCOPY WITH ENDOBRONCHIAL ULTRASOUND (N/A) MEDIASTINOSCOPY (N/A) today. The various methods of treatment have been discussed with the patient. After consideration of the risks, benefits and treatment options the patient has consented to the planned procedure.   The patient has been seen and labs reviewed. There are no changes in the patient's condition to prevent proceeding with the planned procedure today.  Recent labs:  Lab Results  Component Value Date   WBC 20.5 (H) 07/23/2016   HGB 11.7 (L) 07/23/2016   HCT 39.1 07/23/2016   PLT 84 (L) 07/23/2016   GLUCOSE 104 (H) 07/23/2016   CHOL 107 05/03/2016   ALT 122 (H) 07/23/2016   AST 48 (H) 07/23/2016   NA 137 07/23/2016   K 5.2 (H) 07/23/2016   CL 93 (L) 07/23/2016   CREATININE 0.63 07/23/2016   BUN 23 (H) 07/23/2016   CO2 34 (H) 07/23/2016   TSH 0.310 (L) 04/30/2016   INR 1.14 07/23/2016   Off apixaban since 4/1, plts little low but without bleeding    Grace Isaac, MD 07/23/2016 10:28 AM

## 2016-07-23 NOTE — Brief Op Note (Signed)
      [address]       [place] [address]             [phone]       07/23/2016  11:41 AM  PATIENT:  Sheri Ayala  66 y.o. female  PRE-OPERATIVE DIAGNOSIS:  LUNG MASS ADENOPATHY  POST-OPERATIVE DIAGNOSIS:  Non small cell lung cancer on quick stain  PROCEDURE:  Procedure(s): VIDEO BRONCHOSCOPY WITH ENDOBRONCHIAL ULTRASOUND (N/A)   SURGEON:  Surgeon(s) and Role:    * Grace Isaac, MD - Primary  PHYSICIAN ASSISTANT:   ASSISTANTS: none   ANESTHESIA:   general  EBL:  No intake/output data recorded.  BLOOD ADMINISTERED:none  DRAINS: none   LOCAL MEDICATIONS USED:  NONE  SPECIMEN:  Source of Specimen:  left upper lobe bronchus and #7 nodes   DISPOSITION OF SPECIMEN:  PATHOLOGY  COUNTS:  YES   DICTATION: .Dragon Dictation  PLAN OF CARE: patient is inpatient   PATIENT DISPOSITION:  PACU - hemodynamically stable.   Delay start of Pharmacological VTE agent (>24hrs) due to surgical blood loss or risk of bleeding: yes

## 2016-07-23 NOTE — Progress Notes (Signed)
[facility] PROGRESS NOTE  Zykera I Basey HCW:[id number] DOB: December 21, 1950 DOA: 07/15/2016  PCP: Alvester Chou, NP  Brief History/Interval Summary: 66 year old Caucasian female with a past medical history of COPD on home oxygen, CLL, atrial fibrillation not on eliquis anymore due to thrombocytopenia and epistaxis, history of stroke, anxiety and insomnia, presented with progressively worsening shortness of breath. She was found to have COPD exacerbation and was hospitalized. There was also concern for pneumonia and the patient was given antibiotics. CT scan also showed increased hilar adenopathy and mass like abnormalities. Concern for possible  lung cancer. Patient to undergo bronchoscopy and mediastinoscopy today per Dr.Gerhardt  Subjective/Interval History: Breathing better, no complaints,  mild productive cough  Assessment/Plan:  Acute on chronic hypoxic respiratory failure secondary to COPD exacerbation, sepsis, pneumonia -Respiratory failure has resolved.  -as below  Acute COPD exacerbation with chronic hypoxic respiratory failure on 2 L oxygen at home CT chest negative for pulmonary embolism,.  -improving slowly -was on solumedrol, ceftriaxone/zithromax, nebs, OA2 -Stop azithromycin, off ceftriaxone now, completed 7days -change solumedrol to prednisone -ambulate, PT, OOB  Sepsis secondary to pneumonia, obstructive atelectasis -Sepsis resolved. -Abx as note above  Postobstructive atelectasis lingula, left upper lobe secondary to increased hilar adenopathy/masslike abnormalities Lung mass in 2017 was evaluated with bronchoscopy which was nondiagnostic at that time January 2018.  -Oncology/Dr.Sherril  suspects lung cancer rather than CLL or lymphoma and recommended diagnostic biopsy -Cardiothoracic surgery was consulted.  -bronchoscopy and mediastinoscopy today  Hyperkalemia Treated with Kayexalate, recurrent. Now resolved. Monitor clinically.  CLL with associated anemia  and thrombocytopenia -early stage, stable, Outpatient FU with Dr.Sherril  Paroxysmal afib not on anticoagulation CHA2DS2-VASc 4. Not on anticoagulation secondary to thrombocytopenia and epistaxis.  Constipation. -improved on bowel regimen. X-ray did not show any acute changes. TSH was checked in January and was noted to be low but free T4 was normal.  Aortic atherosclerosis Asymptomatic.  DVT Prophylaxis: SCDs    Code Status: Full code  Family Communication: Discussed with the patient, none at bedside Disposition Plan: home tomorrow if stable   Consultants: Oncology. Cardiothoracic surgery.  Procedures: None.  Antibiotics: Ceftriaxone and azithromycin  Objective:  Vital Signs  Vitals:   07/23/16 1207 07/23/16 1220 07/23/16 1231 07/23/16 1240  BP: 113/76 113/73 112/69 (P) 109/76  Pulse: (!) 112 (!) 104 (!) 103 (!) (P) 104  Resp: (!) '21 15 15 '$ (P) 16  Temp: 98.2 F (36.8 C)  98 F (36.7 C)   TempSrc:      SpO2: 93% 92% 94%   Weight:      Height:        Intake/Output Summary (Last 24 hours) at 07/23/16 1322 Last data filed at 07/23/16 1144  Gross per 24 hour  Intake              993 ml  Output               20 ml  Net              973 ml   Filed Weights   07/21/16 0527 07/22/16 0610 07/23/16 0615  Weight: 63 kg (138 lb 12.8 oz) 62.1 kg (137 lb) 59.8 kg (131 lb 14.4 oz)    General appearance: alert, cooperative, appears stated age and no distress Head: Normocephalic, without obvious abnormality, atraumatic Resp: Coarse breath sounds bilaterally. Few scattered wheezes. Few crackles at the right base. No rhonchi. Normal effort Cardio: regular rate and rhythm, S1, S2 normal, no murmur, click, rub or  gallop GI: soft, non-tender; bowel sounds normal; no masses,  no organomegaly Extremities: extremities normal, atraumatic, no cyanosis or edema Neurologic: Awake and alert. Oriented x 3. No focal neurological deficits are present.  Lab Results:  Data Reviewed:  I have personally reviewed following labs and imaging studies  CBC:  Recent Labs Lab 07/18/16 0504 07/19/16 0407 07/20/16 0404 07/21/16 0319 07/23/16 0216  WBC 16.5* 27.1* 19.8* 18.7* 20.5*  HGB 11.4* 11.7* 11.3* 11.4* 11.7*  HCT 36.8 39.0 38.2 38.7 39.1  MCV 93.4 94.0 94.6 94.6 94.0  PLT 111* 113* 92* 94* 84*    Basic Metabolic Panel:  Recent Labs Lab 07/18/16 0504 07/19/16 0407 07/20/16 0404 07/21/16 0319 07/23/16 0216  NA 136 134* 134* 138 137  K 4.7 5.6* 4.7 4.6 5.2*  CL 99* 97* 93* 93* 93*  CO2 29 29 33* 36* 34*  GLUCOSE 127* 108* 100* 98 104*  BUN 30* 27* 21* 21* 23*  CREATININE 0.64 0.63 0.55 0.58 0.63  CALCIUM 8.8* 8.7* 8.4* 8.7* 9.0    GFR: Estimated Creatinine Clearance: 65.3 mL/min (by C-G formula based on SCr of 0.63 mg/dL).  Liver Function Tests:  Recent Labs Lab 07/23/16 0216  AST 48*  ALT 122*  ALKPHOS 72  BILITOT 0.5  PROT 5.1*  ALBUMIN 3.4*    Coagulation Profile:  Recent Labs Lab 07/23/16 0216  INR 1.14    CBG:  Recent Labs Lab 07/18/16 0600 07/19/16 0459 07/20/16 0534 07/21/16 0525 07/23/16 0616  GLUCAP 117* 100* 92 88 84    Radiology Studies: Dg Chest 2 View  Result Date: 07/23/2016 CLINICAL DATA:  Preoperative evaluation.  Shortness of breath. EXAM: CHEST  2 VIEW COMPARISON:  July 19, 2016 chest radiograph and chest CT July 15, 2016 FINDINGS: There is persistent consolidation and volume loss in the left upper lobe with left hilar and mediastinal soft tissue prominence due to underlying mass and adenopathy. On the right, there is a partially loculated pleural effusion with patchy right base airspace consolidation. Heart size is normal. Pulmonary vascularity on the right appears normal. No pneumothorax. No bone lesions. IMPRESSION: Persistent consolidation and volume loss left upper lobe with left hilar and upper lobe mass. Pleural effusion on the right appears increased in size with patchy right base infiltrate, also  present on prior study. Stable cardiac silhouette. Electronically Signed   By: Lowella Grip III M.D.   On: 07/23/2016 07:30     Medications:  Scheduled: . albuterol      . bisacodyl  10 mg Rectal Daily  . cefTRIAXone (ROCEPHIN)  IV  1 g Intravenous Q24H  . Chlorhexidine Gluconate Cloth  6 each Topical Once  . cyclobenzaprine  5 mg Oral TID  . diltiazem  180 mg Oral Daily  . feeding supplement (ENSURE ENLIVE)  237 mL Oral BID BM  . ipratropium-albuterol  3 mL Nebulization BID  . magnesium hydroxide  30 mL Oral Daily  . mouth rinse  15 mL Mouth Rinse BID  . methylPREDNISolone (SOLU-MEDROL) injection  40 mg Intravenous Daily  . mometasone-formoterol  2 puff Inhalation BID  . polyethylene glycol  17 g Oral TID  . senna  1 tablet Oral BID  . sertraline  25 mg Oral Daily  . sodium chloride flush  3 mL Intravenous Q12H  . sodium chloride flush  3 mL Intravenous Q12H  . temazepam  30 mg Oral QHS   Continuous:  XWR:UEAVWU chloride, acetaminophen **OR** acetaminophen, albuterol, ALPRAZolam, diphenhydrAMINE, guaiFENesin-dextromethorphan, ondansetron **OR** ondansetron (ZOFRAN) IV,  sodium chloride flush    LOS: 8 days   [facility]  [facility] Pager [phone] 07/23/2016, 1:22 PM  If 7PM-7AM, please contact night-coverage at [email or web address], password [email or web address]

## 2016-07-23 NOTE — Progress Notes (Signed)
      [address]       [place] [address]             [phone]      Discussed preliminary path with patient and family On d/c will need follow up with Dr Benay Spice Will need mri of brain for complete staging of lung cancer  I have seen and examined Sheri Ayala and agree with the above assessment  and plan.  Grace Isaac MD Beeper [phone] Office [phone] 07/23/2016 3:34 PM

## 2016-07-23 NOTE — Anesthesia Preprocedure Evaluation (Addendum)
Anesthesia Evaluation  Patient identified by MRN, date of birth, ID band Patient awake    Reviewed: Allergy & Precautions, NPO status , Patient's Chart, lab work & pertinent test results  Airway Mallampati: II  TM Distance: >3 FB Neck ROM: Full    Dental  (+) Edentulous Upper, Edentulous Lower, Dental Advisory Given   Pulmonary asthma , COPD,  COPD inhaler and oxygen dependent, Current Smoker,    + rhonchi  + decreased breath sounds      Cardiovascular + dysrhythmias Atrial Fibrillation  Rhythm:Irregular Rate:Normal     Neuro/Psych  Headaches, PSYCHIATRIC DISORDERS Anxiety Depression CVA, No Residual Symptoms    GI/Hepatic   Endo/Other    Renal/GU      Musculoskeletal   Abdominal   Peds  Hematology   Anesthesia Other Findings   Reproductive/Obstetrics                            Anesthesia Physical Anesthesia Plan  ASA: III  Anesthesia Plan: General   Post-op Pain Management:    Induction: Intravenous  Airway Management Planned: Oral ETT  Additional Equipment:   Intra-op Plan:   Post-operative Plan: Extubation in OR  Informed Consent: I have reviewed the patients History and Physical, chart, labs and discussed the procedure including the risks, benefits and alternatives for the proposed anesthesia with the patient or authorized representative who has indicated his/her understanding and acceptance.     Plan Discussed with: CRNA and Anesthesiologist  Anesthesia Plan Comments:         Anesthesia Quick Evaluation

## 2016-07-23 NOTE — Progress Notes (Signed)
Nutrition Follow-up   INTERVENTION:  Continue Ensure Enlive po BID, each supplement provides 350 kcal and 20 grams of protein    NUTRITION DIAGNOSIS:   Increased nutrient needs related to chronic illness (COPD) as evidenced by estimated needs.  Ongoing  GOAL:   Patient will meet greater than or equal to 90% of their needs  Being met  MONITOR:   PO intake, Supplement acceptance, Labs, Weight trends, Skin, I & O's  REASON FOR ASSESSMENT:   Consult COPD Protocol  ASSESSMENT:   Sheri Ayala is a 66 y.o. female with medical history significant for COPD with chronic 2 L/m some low oxygen requirement, chronic lymphocytic leukemia, atrial fibrillation on Eliquis, history of CVA, anxiety, and insomnia who presents to the emergency department with 1 week of progressive exertional dyspnea and cough.   Pt walking in halls with PT at time of visit. Pt NPO earlier today for bronchoscopy and mediastinoscopy.  Per nursing notes, pt has been eating 75 to 100% of meals and drinking some Ensure supplements. Weight is stable.   Labs: low chloride, elevated ALT  Diet Order:  Diet Heart Room service appropriate? Yes; Fluid consistency: Thin  Skin:  Reviewed, no issues  Last BM:  4/8  Height:   Ht Readings from Last 1 Encounters:  07/15/16 '5\' 9"'$  (1.753 m)    Weight:   Wt Readings from Last 1 Encounters:  07/23/16 131 lb 14.4 oz (59.8 kg)    Ideal Body Weight:  65.9 kg  BMI:  Body mass index is 19.48 kg/m.  Estimated Nutritional Needs:   Kcal:  1700-1900  Protein:  85-100 grams  Fluid:  1.7-1.9 L  EDUCATION NEEDS:   Education needs addressed  Scarlette Ar RD, LDN, CSP Inpatient Clinical Dietitian Pager: [phone] After Hours Pager: [phone]

## 2016-07-23 NOTE — Progress Notes (Signed)
Patient back from x-ray 

## 2016-07-23 NOTE — Anesthesia Procedure Notes (Signed)
Procedure Name: Intubation Date/Time: 07/23/2016 10:44 AM Performed by: Garrison Columbus T Pre-anesthesia Checklist: Patient identified, Emergency Drugs available, Suction available and Patient being monitored Patient Re-evaluated:Patient Re-evaluated prior to inductionOxygen Delivery Method: Circle System Utilized Preoxygenation: Pre-oxygenation with 100% oxygen Intubation Type: IV induction Ventilation: Mask ventilation without difficulty and Oral airway inserted - appropriate to patient size Laryngoscope Size: Sabra Heck and 2 Grade View: Grade I Tube type: Oral Tube size: 8.5 mm Number of attempts: 1 Airway Equipment and Method: Stylet and Oral airway Placement Confirmation: ETT inserted through vocal cords under direct vision,  positive ETCO2 and breath sounds checked- equal and bilateral Secured at: 20 cm Tube secured with: Tape Dental Injury: Teeth and Oropharynx as per pre-operative assessment

## 2016-07-24 ENCOUNTER — Inpatient Hospital Stay (HOSPITAL_COMMUNITY): Payer: Medicare HMO

## 2016-07-24 ENCOUNTER — Encounter (HOSPITAL_COMMUNITY): Payer: Self-pay | Admitting: Cardiothoracic Surgery

## 2016-07-24 DIAGNOSIS — C3492 Malignant neoplasm of unspecified part of left bronchus or lung: Secondary | ICD-10-CM

## 2016-07-24 DIAGNOSIS — D72818 Other decreased white blood cell count: Secondary | ICD-10-CM

## 2016-07-24 DIAGNOSIS — I4891 Unspecified atrial fibrillation: Secondary | ICD-10-CM

## 2016-07-24 LAB — COMPREHENSIVE METABOLIC PANEL
ALT: 122 U/L — ABNORMAL HIGH (ref 14–54)
AST: 52 U/L — ABNORMAL HIGH (ref 15–41)
Albumin: 3.4 g/dL — ABNORMAL LOW (ref 3.5–5.0)
Alkaline Phosphatase: 71 U/L (ref 38–126)
Anion gap: 7 (ref 5–15)
BUN: 20 mg/dL (ref 6–20)
CO2: 36 mmol/L — ABNORMAL HIGH (ref 22–32)
Calcium: 8.6 mg/dL — ABNORMAL LOW (ref 8.9–10.3)
Chloride: 94 mmol/L — ABNORMAL LOW (ref 101–111)
Creatinine, Ser: 0.7 mg/dL (ref 0.44–1.00)
GFR calc Af Amer: >60 mL/min (ref >60)
GFR calc non Af Amer: >60 mL/min (ref >60)
Glucose, Bld: 73 mg/dL (ref 65–99)
Potassium: 5.8 mmol/L — ABNORMAL HIGH (ref 3.5–5.1)
Sodium: 137 mmol/L (ref 135–145)
Total Bilirubin: 0.6 mg/dL (ref 0.3–1.2)
Total Protein: 5.2 g/dL — ABNORMAL LOW (ref 6.5–8.1)

## 2016-07-24 LAB — CBC
HCT: 41.5 % (ref 36.0–46.0)
HEMOGLOBIN: 12.3 g/dL (ref 12.0–15.0)
MCH: 28.1 pg (ref 26.0–34.0)
MCHC: 29.6 g/dL — ABNORMAL LOW (ref 30.0–36.0)
MCV: 94.7 fL (ref 78.0–100.0)
PLATELETS: 108 10*3/uL — AB (ref 150–400)
RBC: 4.38 MIL/uL (ref 3.87–5.11)
RDW: 15.5 % (ref 11.5–15.5)
WBC: 25.6 10*3/uL — ABNORMAL HIGH (ref 4.0–10.5)

## 2016-07-24 LAB — GLUCOSE, CAPILLARY: Glucose-Capillary: 78 mg/dL (ref 65–99)

## 2016-07-24 MED ORDER — HYDROCODONE-ACETAMINOPHEN 5-325 MG PO TABS
1.0000 | ORAL_TABLET | Freq: Four times a day (QID) | ORAL | Status: DC | PRN
  Administered 2016-07-24 – 2016-07-27 (×8): 1 via ORAL
  Filled 2016-07-24 (×8): qty 1

## 2016-07-24 MED ORDER — SODIUM POLYSTYRENE SULFONATE 15 GM/60ML PO SUSP
30.0000 g | Freq: Once | ORAL | Status: AC
Start: 2016-07-24 — End: 2016-07-24
  Administered 2016-07-24: 30 g via ORAL
  Filled 2016-07-24: qty 120

## 2016-07-24 MED ORDER — LORAZEPAM 2 MG/ML IJ SOLN
1.0000 mg | Freq: Once | INTRAMUSCULAR | Status: AC
  Administered 2016-07-24: 1 mg via INTRAVENOUS
  Filled 2016-07-24: qty 1

## 2016-07-24 MED ORDER — GADOBENATE DIMEGLUMINE 529 MG/ML IV SOLN
13.0000 mL | Freq: Once | INTRAVENOUS | Status: AC | PRN
  Administered 2016-07-24: 13 mL via INTRAVENOUS

## 2016-07-24 NOTE — Progress Notes (Signed)
IP PROGRESS NOTE  Subjective:   Sheri Ayala underwent a bronchoscopy and EBUS by Dr. Servando Snare yesterday. 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 She complains of pain at the right upper back. No other complaint.  Objective: Vital signs in last 24 hours: Blood pressure 108/61, pulse 95, temperature 98.2 F (36.8 C), temperature source Oral, resp. rate 18, height '5\' 9"'$  (1.753 m), weight 132 lb 9.6 oz (60.1 kg), SpO2 93 %.  Intake/Output from previous day: 04/09 0701 - 04/10 0700 In: 1143 [P.O.:440; I.V.:703] Out: 920 [Urine:900; Blood:20]  Physical Exam:  Lungs: Distant breath sounds, no respiratory distress  Cardiac: Irregular Abdomen: No hepatosplenomegaly Extremities: No leg edema   Lab Results:  Recent Labs  07/23/16 0216 07/24/16 0755  WBC 20.5* 25.6*  HGB 11.7* 12.3  HCT 39.1 41.5  PLT 84* 108*    BMET  Recent Labs  07/23/16 0216 07/24/16 0655  NA 137 137  K 5.2* 5.8*  CL 93* 94*  CO2 34* 36*  GLUCOSE 104* 73  BUN 23* 20  CREATININE 0.63 0.70  CALCIUM 9.0 8.6*    Studies/Results: Dg Chest 2 View  Result Date: 07/24/2016 CLINICAL DATA:  Cough.  Recent pneumonia EXAM: CHEST  2 VIEW COMPARISON:  July 23, 2016 FINDINGS: There is new consolidation in the left lower lobe. There is a small pleural effusion on the left. The left upper lobe mass with consolidation in portions of the left upper lobe remain. There is a partially loculated effusion on the right with right base atelectasis. The heart size is within normal limits. No adenopathy is seen on the right; the hila is obscured by mass and volume loss on the left. Scoliosis is stable. IMPRESSION: New consolidation left lower lobe, presumably acute pneumonia. Mass with consolidation/ atelectasis in the left upper lobe persists. There are partially loculated right pleural effusion with right base  atelectasis. There is a small left pleural effusion. Cardiac silhouette is stable. Electronically Signed   By: Lowella Grip III M.D.   On: 07/24/2016 08:34   Dg Chest 2 View  Result Date: 07/23/2016 CLINICAL DATA:  Preoperative evaluation.  Shortness of breath. EXAM: CHEST  2 VIEW COMPARISON:  July 19, 2016 chest radiograph and chest CT July 15, 2016 FINDINGS: There is persistent consolidation and volume loss in the left upper lobe with left hilar and mediastinal soft tissue prominence due to underlying mass and adenopathy. On the right, there is a partially loculated pleural effusion with patchy right base airspace consolidation. Heart size is normal. Pulmonary vascularity on the right appears normal. No pneumothorax. No bone lesions. IMPRESSION: Persistent consolidation and volume loss left upper lobe with left hilar and upper lobe mass. Pleural effusion on the right appears increased in size with patchy right base infiltrate, also present on prior study. Stable cardiac silhouette. Electronically Signed   By: Lowella Grip III M.D.   On: 07/23/2016 07:30   CT images reviewed Medications: I have reviewed the patient's current medications.  Assessment/Plan:  1. 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 COPD 3. Left lung mass/mediastinal adenopathy status post a nondiagnostic bronchoscopy 04/18/2016  Bronchoscopy/EBUS 07/23/2017 confirmed small cell carcinoma  4. Atrial fibrillation 5. Leukocytosis secondary to CLL and steroids  Sheri Ayala has been diagnosed with small cell lung cancer. She appears to have limited stage disease based on the staging evaluation to date. She was initially noted to have a  lung mass several months ago. We have been unable to get her scheduled for an outpatient PET. I recommend proceeding with a brain MRI and bone scan while she is in the hospital. I recommend proceeding with etoposide/carboplatin this week.  Recommendations: 1. Transfer to the third floor  oncology unit at [facility] in anticipation of beginning etoposide/carboplatin chemotherapy on 07/25/2016 2. Staging brain MRI and bone scan   LOS: 9 days   Betsy Coder, MD   07/24/2016, 12:45 PM

## 2016-07-24 NOTE — Progress Notes (Signed)
Patient slept well last night, VS stable. Only concerns: IV in hand hurting, and wanting to go home.

## 2016-07-24 NOTE — Progress Notes (Signed)
Pt requests chairs be removed from room, too many chairs in the room, pt claustrophobic, pt nervous about the possibility of upcoming MRI

## 2016-07-24 NOTE — Progress Notes (Signed)
MD at bedside explaining procedure

## 2016-07-24 NOTE — Evaluation (Signed)
Occupational Therapy Evaluation Patient Details Name: Sheri Ayala MRN: [id number] DOB: [birth year] Today's Date: 07/24/2016    History of Present Illness Sheri Ayala is a 66 y.o. female with PMH significant for COPD (on chronic oxygen supplementation, CLL, tobacco abuse , depression, anxiety, left lung mass and hx of CVA (w/o residual deficit); presented with worsening SOB. Pt found to have PNA and left lung masses. Pt with COPD exacerbation. Pt found to have stage III lung CA.   Clinical Impression   Pt. With newly dx lung cancer and hx of copd. Pt. Was educated on energy conservaion strategies and provided handout. Pt. Is mod I to I with adls and mobility. Pt. To have assist with IADLs at home. No further acute OT needed.    Follow Up Recommendations       Equipment Recommendations       Recommendations for Other Services       Precautions / Restrictions Precautions Precautions: None Restrictions Weight Bearing Restrictions: No      Mobility Bed Mobility Overal bed mobility: Independent                Transfers Overall transfer level: Modified independent     Sit to Stand: Modified independent (Device/Increase time)              Balance                                           ADL either performed or assessed with clinical judgement   ADL Overall ADL's : Modified independent                                             Vision Baseline Vision/History: Wears glasses Wears Glasses: Reading only;Distance only Patient Visual Report: No change from baseline Vision Assessment?: No apparent visual deficits     Perception     Praxis      Pertinent Vitals/Pain Pain Assessment: 0-10 Pain Score: 8  Pain Location:  (across chest and back) Pain Descriptors / Indicators: Aching;Burning Pain Intervention(s): Limited activity within patient's tolerance;Patient requesting pain meds-RN notified     Hand Dominance Right   Extremity/Trunk Assessment Upper Extremity Assessment Upper Extremity Assessment: Overall WFL for tasks assessed           Communication Communication Communication: No difficulties   Cognition Arousal/Alertness: Awake/alert Behavior During Therapy: WFL for tasks assessed/performed Overall Cognitive Status: Within Functional Limits for tasks assessed                                     General Comments       Exercises     Shoulder Instructions      Home Living Family/patient expects to be discharged to:: Private residence Living Arrangements: Alone Available Help at Discharge: Neighbor;Available 24 hours/day;Family Type of Home: Mobile home Home Access: Stairs to enter Entrance Stairs-Number of Steps: 2 Entrance Stairs-Rails: Can reach both Home Layout: One level     Bathroom Shower/Tub: Teacher, early years/pre: Standard     Home Equipment: Cane - single point;Shower seat;Grab bars - tub/shower;Hand held shower head   Additional Comments: Pt to stay with son initially  Prior Functioning/Environment Level of Independence: Independent        Comments: intermittent use of SPC with severe SOB. son does driving and grocery shopping         OT Problem List:        OT Treatment/Interventions:      OT Goals(Current goals can be found in the care plan section) Acute Rehab OT Goals Patient Stated Goal:  (go home) OT Goal Formulation: With patient  OT Frequency:     Barriers to D/C:            Co-evaluation              End of Session Equipment Utilized During Treatment: Gait belt Nurse Communication: Patient requests pain meds  Activity Tolerance: Patient tolerated treatment well Patient left: in bed;with call bell/phone within reach                   Time: 0900-0927 OT Time Calculation (min): 27 min Charges:  OT General Charges $OT Visit: 1 Procedure OT Evaluation $OT Eval Low Complexity: 1 Procedure OT  Treatments $Self Care/Home Management : 8-22 mins G-Codes:     6 clicks score 24   Sheri Ayala 07/24/2016, 9:28 AM

## 2016-07-24 NOTE — Progress Notes (Signed)
MD at bedside explaining treatment options.

## 2016-07-24 NOTE — Progress Notes (Signed)
[facility] PROGRESS NOTE  Sheri Ayala FWY:[id number] DOB: Mar 05, 1951 DOA: 07/15/2016  PCP: Alvester Chou, NP  Brief History/Interval Summary: 66 year old Caucasian female with a past medical history of COPD on home oxygen, CLL, atrial fibrillation not on eliquis anymore due to thrombocytopenia and epistaxis, history of stroke, anxiety and insomnia, presented with progressively worsening shortness of breath. She was found to have COPD exacerbation and was hospitalized. There was also concern for pneumonia and the patient was given antibiotics. CT scan also showed increased hilar adenopathy and mass like abnormalities. Concern for possible  lung cancer. Patient to undergo bronchoscopy and mediastinoscopy today per Dr.Gerhardt  Subjective/Interval History: Breathing better, Dr.Sherril reviewed prelim biopsy results with pt  Assessment/Plan:  Acute on chronic hypoxic respiratory failure secondary to COPD exacerbation, sepsis, pneumonia -Respiratory failure has resolved.  -as below  Acute COPD exacerbation with chronic hypoxic respiratory failure on 2 L oxygen at home CT chest negative for pulmonary embolism,. Mass/adenopathy as below -improving slowly -was on solumedrol, ceftriaxone/zithromax, nebs, OA2 -Stop azithromycin, off ceftriaxone now, completed 7days -changed solumedrol to prednisone -ambulate, PT, OOB -home with [facility] RN, pending MRI Brain  Sepsis secondary to pneumonia, obstructive atelectasis -Sepsis resolved. -Abx as note above  Postobstructive atelectasis lingula, left upper lobe secondary to increased hilar adenopathy/masslike abnormalities Lung mass in 2017 was evaluated with bronchoscopy which was nondiagnostic at that time January 2018.  -Oncology/Dr.Sherril  suspects lung cancer rather than CLL or lymphoma and recommended diagnostic biopsy -Cardiothoracic surgery was consulted.  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 Now resolved  CLL with associated anemia and thrombocytopenia -early stage, stable, Outpatient FU with Dr.Sherril  Paroxysmal afib not on anticoagulation CHA2DS2-VASc 4. Not on anticoagulation secondary to thrombocytopenia and epistaxis.  Constipation. -improved on bowel regimen. X-ray did not show any acute changes. TSH was checked in January and was noted to be low but free T4 was normal.  Aortic atherosclerosis Asymptomatic.  DVT Prophylaxis: SCDs    Code Status: Full code  Family Communication: Discussed with the patient, none at bedside Disposition Plan: home later today or tomorrow pending MRI Brain   Consultants: Oncology. Cardiothoracic surgery.  Procedures: VIDEO BRONCHOSCOPY WITH ENDOBRONCHIAL ULTRASOUND (N/A)  Antibiotics: Ceftriaxone and azithromycin  Objective:  Vital Signs  Vitals:   07/23/16 2056 07/24/16 0644 07/24/16 0800 07/24/16 0832  BP:  105/62 108/61   Pulse:  93 95   Resp:  18 18   Temp:  98.4 F (36.9 C) 98.2 F (36.8 C)   TempSrc:  Oral Oral   SpO2: 93% 100% 95% 93%  Weight:  60.1 kg (132 lb 9.6 oz)    Height:        Intake/Output Summary (Last 24 hours) at 07/24/16 1149 Last data filed at 07/24/16 0900  Gross per 24 hour  Intake              563 ml  Output              900 ml  Net             -337 ml   Filed Weights   07/22/16 0610 07/23/16 0615 07/24/16 0644  Weight: 62.1 kg (137 lb) 59.8 kg (131 lb 14.4 oz) 60.1 kg (132 lb 9.6 oz)    General appearance: alert, cooperative, appears stated age and no distress Head: Normocephalic, without  obvious abnormality, atraumatic Resp: Coarse breath sounds bilaterally. Few scattered wheezes. Few crackles at the right base. No rhonchi. Normal effort Cardio: regular rate and rhythm, S1, S2 normal, no murmur, click, rub or gallop GI: soft, non-tender; bowel sounds  normal; no masses,  no organomegaly Extremities: extremities normal, atraumatic, no cyanosis or edema Neurologic: Awake and alert. Oriented x 3. No focal neurological deficits are present.  Lab Results:  Data Reviewed: I have personally reviewed following labs and imaging studies  CBC:  Recent Labs Lab 07/19/16 0407 07/20/16 0404 07/21/16 0319 07/23/16 0216 07/24/16 0755  WBC 27.1* 19.8* 18.7* 20.5* 25.6*  HGB 11.7* 11.3* 11.4* 11.7* 12.3  HCT 39.0 38.2 38.7 39.1 41.5  MCV 94.0 94.6 94.6 94.0 94.7  PLT 113* 92* 94* 84* 108*    Basic Metabolic Panel:  Recent Labs Lab 07/19/16 0407 07/20/16 0404 07/21/16 0319 07/23/16 0216 07/24/16 0655  NA 134* 134* 138 137 137  K 5.6* 4.7 4.6 5.2* 5.8*  CL 97* 93* 93* 93* 94*  CO2 29 33* 36* 34* 36*  GLUCOSE 108* 100* 98 104* 73  BUN 27* 21* 21* 23* 20  CREATININE 0.63 0.55 0.58 0.63 0.70  CALCIUM 8.7* 8.4* 8.7* 9.0 8.6*    GFR: Estimated Creatinine Clearance: 65.6 mL/min (by C-G formula based on SCr of 0.7 mg/dL).  Liver Function Tests:  Recent Labs Lab 07/23/16 0216 07/24/16 0655  AST 48* 52*  ALT 122* 122*  ALKPHOS 72 71  BILITOT 0.5 0.6  PROT 5.1* 5.2*  ALBUMIN 3.4* 3.4*    Coagulation Profile:  Recent Labs Lab 07/23/16 0216  INR 1.14    CBG:  Recent Labs Lab 07/18/16 0600 07/19/16 0459 07/20/16 0534 07/21/16 0525 07/23/16 0616  GLUCAP 117* 100* 92 88 84    Radiology Studies: Dg Chest 2 View  Result Date: 07/24/2016 CLINICAL DATA:  Cough.  Recent pneumonia EXAM: CHEST  2 VIEW COMPARISON:  July 23, 2016 FINDINGS: There is new consolidation in the left lower lobe. There is a small pleural effusion on the left. The left upper lobe mass with consolidation in portions of the left upper lobe remain. There is a partially loculated effusion on the right with right base atelectasis. The heart size is within normal limits. No adenopathy is seen on the right; the hila is obscured by mass and volume loss on  the left. Scoliosis is stable. IMPRESSION: New consolidation left lower lobe, presumably acute pneumonia. Mass with consolidation/ atelectasis in the left upper lobe persists. There are partially loculated right pleural effusion with right base atelectasis. There is a small left pleural effusion. Cardiac silhouette is stable. Electronically Signed   By: Lowella Grip III M.D.   On: 07/24/2016 08:34   Dg Chest 2 View  Result Date: 07/23/2016 CLINICAL DATA:  Preoperative evaluation.  Shortness of breath. EXAM: CHEST  2 VIEW COMPARISON:  July 19, 2016 chest radiograph and chest CT July 15, 2016 FINDINGS: There is persistent consolidation and volume loss in the left upper lobe with left hilar and mediastinal soft tissue prominence due to underlying mass and adenopathy. On the right, there is a partially loculated pleural effusion with patchy right base airspace consolidation. Heart size is normal. Pulmonary vascularity on the right appears normal. No pneumothorax. No bone lesions. IMPRESSION: Persistent consolidation and volume loss left upper lobe with left hilar and upper lobe mass. Pleural effusion on the right appears increased in size with patchy right base infiltrate, also present on prior study. Stable cardiac silhouette.  Electronically Signed   By: Lowella Grip III M.D.   On: 07/23/2016 07:30     Medications:  Scheduled: . bisacodyl  10 mg Rectal Daily  . Chlorhexidine Gluconate Cloth  6 each Topical Once  . cyclobenzaprine  5 mg Oral TID  . diltiazem  180 mg Oral Daily  . feeding supplement (ENSURE ENLIVE)  237 mL Oral BID BM  . ipratropium-albuterol  3 mL Nebulization BID  . LORazepam  1 mg Intravenous Once  . magnesium hydroxide  30 mL Oral Daily  . mouth rinse  15 mL Mouth Rinse BID  . mometasone-formoterol  2 puff Inhalation BID  . polyethylene glycol  17 g Oral TID  . predniSONE  40 mg Oral Q breakfast  . senna  1 tablet Oral BID  . sertraline  25 mg Oral Daily  . sodium  chloride flush  3 mL Intravenous Q12H  . sodium chloride flush  3 mL Intravenous Q12H  . temazepam  30 mg Oral QHS   Continuous:  NVB:TYOMAY chloride, acetaminophen **OR** acetaminophen, albuterol, ALPRAZolam, diphenhydrAMINE, guaiFENesin-dextromethorphan, HYDROcodone-acetaminophen, ondansetron **OR** ondansetron (ZOFRAN) IV, sodium chloride flush    LOS: 9 days   [facility]  [facility] Pager [phone] 07/24/2016, 11:49 AM  If 7PM-7AM, please contact night-coverage at [email or web address], password [facility]

## 2016-07-24 NOTE — Progress Notes (Signed)
Paged MD regarding pt requesting stronger pain medication and anxiety medication for MRI. Awaiting call back.

## 2016-07-24 NOTE — Progress Notes (Signed)
Report called to [facility] transport will be set up once patient returns from MRI.

## 2016-07-24 NOTE — Progress Notes (Signed)
Pt. To be discharged to [facility] hospital for further treatment. Pt. Alert and stable. No s/s of distress or discomfort noted. Pt. Currently resting in bed, eating dinner. [facility] transport now en route to transfer pt.

## 2016-07-25 ENCOUNTER — Inpatient Hospital Stay (HOSPITAL_COMMUNITY): Payer: Medicare HMO

## 2016-07-25 ENCOUNTER — Telehealth: Payer: Self-pay | Admitting: *Deleted

## 2016-07-25 ENCOUNTER — Other Ambulatory Visit (HOSPITAL_COMMUNITY): Payer: Medicare HMO

## 2016-07-25 ENCOUNTER — Encounter: Payer: Self-pay | Admitting: *Deleted

## 2016-07-25 DIAGNOSIS — C3490 Malignant neoplasm of unspecified part of unspecified bronchus or lung: Secondary | ICD-10-CM

## 2016-07-25 LAB — TYPE AND SCREEN
ABO/RH(D): A POS
Antibody Screen: NEGATIVE

## 2016-07-25 LAB — COMPREHENSIVE METABOLIC PANEL
ALT: 116 U/L — ABNORMAL HIGH (ref 14–54)
ANION GAP: 8 (ref 5–15)
AST: 53 U/L — ABNORMAL HIGH (ref 15–41)
Albumin: 3.6 g/dL (ref 3.5–5.0)
Alkaline Phosphatase: 73 U/L (ref 38–126)
BILIRUBIN TOTAL: 0.9 mg/dL (ref 0.3–1.2)
BUN: 25 mg/dL — ABNORMAL HIGH (ref 6–20)
CO2: 37 mmol/L — ABNORMAL HIGH (ref 22–32)
Calcium: 8.7 mg/dL — ABNORMAL LOW (ref 8.9–10.3)
Chloride: 92 mmol/L — ABNORMAL LOW (ref 101–111)
Creatinine, Ser: 0.66 mg/dL (ref 0.44–1.00)
GFR calc non Af Amer: >60 mL/min (ref >60)
GLUCOSE: 122 mg/dL — AB (ref 65–99)
POTASSIUM: 4.6 mmol/L (ref 3.5–5.1)
Sodium: 137 mmol/L (ref 135–145)
TOTAL PROTEIN: 5.7 g/dL — AB (ref 6.5–8.1)

## 2016-07-25 LAB — GLUCOSE, CAPILLARY: Glucose-Capillary: 79 mg/dL (ref 65–99)

## 2016-07-25 LAB — ABO/RH: ABO/RH(D): A POS

## 2016-07-25 MED ORDER — PALONOSETRON HCL INJECTION 0.25 MG/5ML
0.2500 mg | Freq: Once | INTRAVENOUS | Status: AC
  Administered 2016-07-25: 0.25 mg via INTRAVENOUS
  Filled 2016-07-25: qty 5

## 2016-07-25 MED ORDER — ALTEPLASE 2 MG IJ SOLR: 2.0000 mg | Freq: Once | INTRAMUSCULAR | Status: DC | PRN

## 2016-07-25 MED ORDER — SODIUM CHLORIDE 0.9% FLUSH: 3.0000 mL | INTRAVENOUS | Status: DC | PRN

## 2016-07-25 MED ORDER — SODIUM CHLORIDE 0.9 % IV SOLN
10.0000 mg | Freq: Once | INTRAVENOUS | Status: AC
  Administered 2016-07-25: 10 mg via INTRAVENOUS
  Filled 2016-07-25: qty 1

## 2016-07-25 MED ORDER — ONDANSETRON HCL 4 MG PO TABS: 4.0000 mg | ORAL_TABLET | Freq: Four times a day (QID) | ORAL | Status: DC | PRN

## 2016-07-25 MED ORDER — SODIUM CHLORIDE 0.9% FLUSH
10.0000 mL | Freq: Two times a day (BID) | INTRAVENOUS | Status: DC
  Administered 2016-07-26 – 2016-07-27 (×2): 10 mL

## 2016-07-25 MED ORDER — SODIUM CHLORIDE 0.9% FLUSH: 10.0000 mL | INTRAVENOUS | Status: DC | PRN

## 2016-07-25 MED ORDER — SODIUM CHLORIDE 0.9 % IV SOLN: 250.0000 mL | INTRAVENOUS | Status: DC | PRN

## 2016-07-25 MED ORDER — TECHNETIUM TC 99M MEDRONATE IV KIT
21.9000 | PACK | Freq: Once | INTRAVENOUS | Status: AC | PRN
  Administered 2016-07-25: 21.9 via INTRAVENOUS

## 2016-07-25 MED ORDER — HOT PACK MISC ONCOLOGY
1.0000 | Freq: Once | Status: AC | PRN
  Filled 2016-07-25: qty 1

## 2016-07-25 MED ORDER — SODIUM CHLORIDE 0.9 % IV SOLN
386.0000 mg | Freq: Once | INTRAVENOUS | Status: AC
  Administered 2016-07-25: 390 mg via INTRAVENOUS
  Filled 2016-07-25: qty 39

## 2016-07-25 MED ORDER — HEPARIN SOD (PORK) LOCK FLUSH 100 UNIT/ML IV SOLN
250.0000 [IU] | Freq: Once | INTRAVENOUS | Status: DC | PRN
  Filled 2016-07-25: qty 5

## 2016-07-25 MED ORDER — SODIUM CHLORIDE 0.9 % IV SOLN
80.0000 mg/m2 | Freq: Once | INTRAVENOUS | Status: AC
  Administered 2016-07-25: 140 mg via INTRAVENOUS
  Filled 2016-07-25: qty 7

## 2016-07-25 MED ORDER — FLUCONAZOLE 100 MG PO TABS
50.0000 mg | ORAL_TABLET | Freq: Every day | ORAL | Status: DC
  Administered 2016-07-25 – 2016-07-27 (×3): 50 mg via ORAL
  Filled 2016-07-25 (×3): qty 1

## 2016-07-25 MED ORDER — ONDANSETRON HCL 4 MG/2ML IJ SOLN: 4.0000 mg | Freq: Four times a day (QID) | INTRAMUSCULAR | Status: DC | PRN

## 2016-07-25 MED ORDER — HEPARIN SOD (PORK) LOCK FLUSH 100 UNIT/ML IV SOLN: 500.0000 [IU] | Freq: Once | INTRAVENOUS | Status: DC | PRN

## 2016-07-25 MED ORDER — SODIUM CHLORIDE 0.9 % IV SOLN
Freq: Once | INTRAVENOUS | Status: AC
  Administered 2016-07-25: 14:00:00 via INTRAVENOUS

## 2016-07-25 NOTE — Progress Notes (Addendum)
[facility] PROGRESS NOTE  Sheri Ayala FBP:[id number] DOB: 1950/08/22 DOA: 07/15/2016  PCP: Alvester Chou, NP  Brief History/Interval Summary: 66 year old Caucasian female with a past medical history of COPD on home oxygen, CLL, atrial fibrillation not on eliquis anymore due to thrombocytopenia and epistaxis, history of stroke, anxiety and insomnia, presented with progressively worsening shortness of breath. She was found to have COPD exacerbation and was hospitalized. There was also concern for pneumonia and the patient was given antibiotics. CT scan also showed increased hilar adenopathy and mass like abnormalities. Concern for possible  lung cancer. Patient underwent bronchoscopy and EBUS by Dr.Gerhardt.   Subjective/Interval History: Patient feels well. Denies any complaints. Waiting on chemotherapy.  Assessment/Plan: Hilar adenopathy/masslike abnormalities/small cell lung cancer Lung mass in 2017 was evaluated with bronchoscopy which was nondiagnostic at that time January 2018.  -Oncology/Dr.Sherril suspected lung cancer rather than CLL or lymphoma and recommended diagnostic biopsy -Cardiothoracic surgery was consulted.  -s/p bronchoscopy and EBUS 4/9 -Path revealed small cell Lung CA, MRI Brain revealed calvarial lesions. Patient then transferred over to [facility] for chemotherapy. Bone scan to be done as well.  Acute on chronic hypoxic respiratory failure secondary to COPD exacerbation, sepsis, pneumonia -Respiratory failure has resolved.  -as below  Acute COPD exacerbation with chronic hypoxic respiratory failure on 2 L oxygen at home CT chest negative for pulmonary embolism,. Mass/adenopathy as below -improving slowly -was on solumedrol, ceftriaxone/zithromax, nebs, OA2 -Patient has completed course of ceftriaxone and azithromycin. -changed solumedrol to prednisone -ambulate, PT, OOB -Will need home health when ready for discharge  Sepsis secondary to pneumonia,  obstructive atelectasis -Sepsis resolved. -Abx as note above  Hyperkalemia Treated with Kayexalate, recurrent. Now resolved  CLL with associated anemia and thrombocytopenia -early stage, stable, Outpatient FU with Dr.Sherril  Paroxysmal afib not on anticoagulation CHA2DS2-VASc 4. Not on anticoagulation secondary to thrombocytopenia and epistaxis.  Constipation. -improved on bowel regimen. X-ray did not show any acute changes. TSH was checked in January and was noted to be low but free T4 was normal.  Aortic atherosclerosis Asymptomatic.  DVT Prophylaxis: SCDs    Code Status: Full code  Family Communication: Discussed with the patient, none at bedside Disposition Plan: Waiting on chemotherapy   Consultants: Oncology. Cardiothoracic surgery.  Procedures: VIDEO BRONCHOSCOPY WITH ENDOBRONCHIAL ULTRASOUND (N/A)  Antibiotics: Ceftriaxone and azithromycin course has been completed  Objective:  Vital Signs  Vitals:   07/25/16 0436 07/25/16 0854 07/25/16 0857 07/25/16 0918  BP: 114/72   115/70  Pulse: 94     Resp: 17     Temp: 98.2 F (36.8 C)     TempSrc: Oral     SpO2: 97% 95% 95%   Weight: 59.7 kg (131 lb 11.2 oz)     Height:        Intake/Output Summary (Last 24 hours) at 07/25/16 1116 Last data filed at 07/25/16 0600  Gross per 24 hour  Intake              960 ml  Output             1000 ml  Net              -40 ml   Filed Weights   07/24/16 0644 07/24/16 2030 07/25/16 0436  Weight: 60.1 kg (132 lb 9.6 oz) 59.3 kg (130 lb 12.8 oz) 59.7 kg (131 lb 11.2 oz)    General appearance: alert, cooperative, appears stated age and no distress Resp: Normal effort. No wheezing, rales  or rhonchi. Cardio: regular rate and rhythm, S1, S2 normal, no murmur, click, rub or gallop GI: soft, non-tender; bowel sounds normal; no masses,  no organomegaly Extremities: No edema Neurologic: Awake and alert. Oriented x 3. No focal neurological deficits are present.  Lab  Results:  Data Reviewed: I have personally reviewed following labs and imaging studies  CBC:  Recent Labs Lab 07/19/16 0407 07/20/16 0404 07/21/16 0319 07/23/16 0216 07/24/16 0755  WBC 27.1* 19.8* 18.7* 20.5* 25.6*  HGB 11.7* 11.3* 11.4* 11.7* 12.3  HCT 39.0 38.2 38.7 39.1 41.5  MCV 94.0 94.6 94.6 94.0 94.7  PLT 113* 92* 94* 84* 108*    Basic Metabolic Panel:  Recent Labs Lab 07/20/16 0404 07/21/16 0319 07/23/16 0216 07/24/16 0655 07/25/16 0849  NA 134* 138 137 137 137  K 4.7 4.6 5.2* 5.8* 4.6  CL 93* 93* 93* 94* 92*  CO2 33* 36* 34* 36* 37*  GLUCOSE 100* 98 104* 73 122*  BUN 21* 21* 23* 20 25*  CREATININE 0.55 0.58 0.63 0.70 0.66  CALCIUM 8.4* 8.7* 9.0 8.6* 8.7*    GFR: Estimated Creatinine Clearance: 65.2 mL/min (by C-G formula based on SCr of 0.66 mg/dL).  Liver Function Tests:  Recent Labs Lab 07/23/16 0216 07/24/16 0655 07/25/16 0849  AST 48* 52* 53*  ALT 122* 122* 116*  ALKPHOS 72 71 73  BILITOT 0.5 0.6 0.9  PROT 5.1* 5.2* 5.7*  ALBUMIN 3.4* 3.4* 3.6    Coagulation Profile:  Recent Labs Lab 07/23/16 0216  INR 1.14    CBG:  Recent Labs Lab 07/20/16 0534 07/21/16 0525 07/23/16 0616 07/24/16 0641 07/25/16 0752  GLUCAP 92 88 84 78 79    Radiology Studies: Dg Chest 2 View  Result Date: 07/24/2016 CLINICAL DATA:  Cough.  Recent pneumonia EXAM: CHEST  2 VIEW COMPARISON:  July 23, 2016 FINDINGS: There is new consolidation in the left lower lobe. There is a small pleural effusion on the left. The left upper lobe mass with consolidation in portions of the left upper lobe remain. There is a partially loculated effusion on the right with right base atelectasis. The heart size is within normal limits. No adenopathy is seen on the right; the hila is obscured by mass and volume loss on the left. Scoliosis is stable. IMPRESSION: New consolidation left lower lobe, presumably acute pneumonia. Mass with consolidation/ atelectasis in the left upper  lobe persists. There are partially loculated right pleural effusion with right base atelectasis. There is a small left pleural effusion. Cardiac silhouette is stable. Electronically Signed   By: Lowella Grip III M.D.   On: 07/24/2016 08:34   Mr Jeri Cos ID Contrast  Result Date: 07/24/2016 CLINICAL DATA:  Newly diagnosed non-small cell lung cancer. Here for staging. History of leukemia, headache, viral encephalitis, atrial fibrillation and stroke. EXAM: MRI HEAD WITHOUT AND WITH CONTRAST TECHNIQUE: Multiplanar, multiecho pulse sequences of the brain and surrounding structures were obtained without and with intravenous contrast. CONTRAST:  36m MULTIHANCE GADOBENATE DIMEGLUMINE 529 MG/ML IV SOLN COMPARISON:  MRI of the head January 31, 2015 FINDINGS: INTRACRANIAL CONTENTS: No reduced diffusion to suggest acute ischemia, hypercellular tumor nor hyperacute demyelination. No susceptibility artifact to suggest hemorrhage. The ventricles and sulci are normal for patient's age. Patchy to confluent increased supratentorial white matter FLAIR T2 hyperintensities in a nonspecific distribution. VASCULAR: Normal major intracranial vascular flow voids present at skull base. SKULL AND UPPER CERVICAL SPINE: Multiple new rounded foci of low T1, low T2 calvarial signal with reduced  diffusion. No abnormal sellar expansion. Craniocervical junction maintained. SINUSES/ORBITS: Minimal paranasal sinus mucosal thickening with trace mastoid effusions. The included ocular globes and orbital contents are non-suspicious. OTHER: Patient is edentulous. 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 No convincing evidence of intracranial metastatic disease. Recommend bone scan or PET-CT. No acute intracranial process. Progressed nonspecific moderate to severe white matter changes. Electronically Signed   By: Elon Alas M.D.   On: 07/24/2016 19:11     Medications:  Scheduled: . bisacodyl  10 mg Rectal Daily  .  Chlorhexidine Gluconate Cloth  6 each Topical Once  . cyclobenzaprine  5 mg Oral TID  . diltiazem  180 mg Oral Daily  . feeding supplement (ENSURE ENLIVE)  237 mL Oral BID BM  . fluconazole  50 mg Oral Daily  . ipratropium-albuterol  3 mL Nebulization BID  . magnesium hydroxide  30 mL Oral Daily  . mouth rinse  15 mL Mouth Rinse BID  . mometasone-formoterol  2 puff Inhalation BID  . polyethylene glycol  17 g Oral TID  . predniSONE  40 mg Oral Q breakfast  . senna  1 tablet Oral BID  . sertraline  25 mg Oral Daily  . sodium chloride flush  3 mL Intravenous Q12H  . sodium chloride flush  3 mL Intravenous Q12H  . temazepam  30 mg Oral QHS   Continuous:  AUQ:JFHLKT chloride, acetaminophen **OR** acetaminophen, albuterol, ALPRAZolam, diphenhydrAMINE, guaiFENesin-dextromethorphan, HYDROcodone-acetaminophen, ondansetron **OR** ondansetron (ZOFRAN) IV, sodium chloride flush    LOS: 10 days   [facility] Pager [phone] 07/25/2016, 11:16 AM  If 7PM-7AM, please contact night-coverage at [email or web address], password [facility]

## 2016-07-25 NOTE — Progress Notes (Cosign Needed)
Dosages and dilutions for Etoposide and Carboplatin verified with Drue Dun, RN

## 2016-07-25 NOTE — Telephone Encounter (Signed)
Call from pt's son requesting to speak with Dr. Benay Spice: Pt told him they found a "spot" on the MRI. Also requesting bone density test results. Per Herbie Baltimore, pt is having difficulty remembering everything the doctors tell her.   Informed him that Dr. Benay Spice will see pt around 0730 tomorrow, requested he be there then. He does not drive so isn't sure he can make it that early.  Son requests a call tomorrow if he's unable to make it.

## 2016-07-25 NOTE — Progress Notes (Signed)
Not ready for PICC line placement right now, about to hang chemo drug that will expire soon. RN to update PICC nurse when chemo is done.

## 2016-07-25 NOTE — Progress Notes (Signed)
Chemotherapy education begun this afternoon when patient had to go for bone scan.  Was able to cover low blood counts and associated side effects.  Also covered how we manage low red blood cells, low platelets and low white blood cells.  Some of this information was familiar to her because of her leukemia.  Have asked patient's nurse, Adline Peals to continue after she returns from scan. Have left all printed materials in patient's room and will follow up tomorrow.

## 2016-07-25 NOTE — Progress Notes (Signed)
IP PROGRESS NOTE  Subjective:   Sheri Ayala was transferred to [facility] last night. She has developed a rash over the trunk. No pruritus. She continues to have pain at the right upper back. The pain was relieved with hydrocodone.   Objective: Vital signs in last 24 hours: Blood pressure 114/72, pulse 94, temperature 98.2 F (36.8 C), temperature source Oral, resp. rate 17, height '5\' 9"'$  (1.753 m), weight 131 lb 11.2 oz (59.7 kg), SpO2 97 %.  Intake/Output from previous day: 04/10 0701 - 04/11 0700 In: 1080 [P.O.:1080] Out: 1000 [Urine:1000]  Physical Exam: HEENT: Thrush over the tongue and buccal mucosa Lungs: Distant breath sounds, no respiratory distress  Cardiac: Irregular Abdomen: No hepatosplenomegaly Extremities: No leg edema Skin: Erythematous maculopapular rash over the trunk Musculoskeletal: No tenderness at the right back  Lab Results:  Recent Labs  07/23/16 0216 07/24/16 0755  WBC 20.5* 25.6*  HGB 11.7* 12.3  HCT 39.1 41.5  PLT 84* 108*    BMET  Recent Labs  07/23/16 0216 07/24/16 0655  NA 137 137  K 5.2* 5.8*  CL 93* 94*  CO2 34* 36*  GLUCOSE 104* 73  BUN 23* 20  CREATININE 0.63 0.70  CALCIUM 9.0 8.6*    Studies/Results: Dg Chest 2 View  Result Date: 07/24/2016 CLINICAL DATA:  Cough.  Recent pneumonia EXAM: CHEST  2 VIEW COMPARISON:  July 23, 2016 FINDINGS: There is new consolidation in the left lower lobe. There is a small pleural effusion on the left. The left upper lobe mass with consolidation in portions of the left upper lobe remain. There is a partially loculated effusion on the right with right base atelectasis. The heart size is within normal limits. No adenopathy is seen on the right; the hila is obscured by mass and volume loss on the left. Scoliosis is stable. IMPRESSION: New consolidation left lower lobe, presumably acute pneumonia. Mass with consolidation/ atelectasis in the left upper lobe persists. There are partially loculated right  pleural effusion with right base atelectasis. There is a small left pleural effusion. Cardiac silhouette is stable. Electronically Signed   By: Lowella Grip III M.D.   On: 07/24/2016 08:34   Mr Jeri Cos ZO Contrast  Result Date: 07/24/2016 CLINICAL DATA:  Newly diagnosed non-small cell lung cancer. Here for staging. History of leukemia, headache, viral encephalitis, atrial fibrillation and stroke. EXAM: MRI HEAD WITHOUT AND WITH CONTRAST TECHNIQUE: Multiplanar, multiecho pulse sequences of the brain and surrounding structures were obtained without and with intravenous contrast. CONTRAST:  4m MULTIHANCE GADOBENATE DIMEGLUMINE 529 MG/ML IV SOLN COMPARISON:  MRI of the head January 31, 2015 FINDINGS: INTRACRANIAL CONTENTS: No reduced diffusion to suggest acute ischemia, hypercellular tumor nor hyperacute demyelination. No susceptibility artifact to suggest hemorrhage. The ventricles and sulci are normal for patient's age. Patchy to confluent increased supratentorial white matter FLAIR T2 hyperintensities in a nonspecific distribution. VASCULAR: Normal major intracranial vascular flow voids present at skull base. SKULL AND UPPER CERVICAL SPINE: Multiple new rounded foci of low T1, low T2 calvarial signal with reduced diffusion. No abnormal sellar expansion. Craniocervical junction maintained. SINUSES/ORBITS: Minimal paranasal sinus mucosal thickening with trace mastoid effusions. The included ocular globes and orbital contents are non-suspicious. OTHER: Patient is edentulous. 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 No convincing evidence of intracranial metastatic disease. Recommend bone scan or PET-CT. No acute intracranial process. Progressed nonspecific moderate to severe white matter changes. Electronically Signed   By: CElon AlasM.D.   On: 07/24/2016 19:11  CT images reviewed Medications: I have reviewed the patient's current medications.  Assessment/Plan:  1. 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 COPD 3. 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 Atrial fibrillation 5. Leukocytosis secondary to CLL and steroids 6. Rash-appears to have a drug rash, could be related to antibiotics given earlier during this admission, or contrast for the brain MRI 7. 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 She appears to have extensive stage disease based on the brain MRI from yesterday. She will complete a staging evaluation with a bone scan. She could have metastatic disease at the thoracic spine, scapula, or posterior ribs to explain the right upper back pain. I discussed the diagnosis and treatment options with Sheri Ayala. I recommend systemic chemotherapy with etoposide/carboplatin. We reviewed the potential toxicities associated with this regimen including the chance for nausea/vomiting, mucositis, diarrhea, alopecia, infection, and hematologic toxicity. We also discussed the potential for an allergic reaction. She agrees to proceed. I recommend Neulasta support. We reviewed the potential side effects associated with Neulasta. The plan is to begin cycle 1 etoposide/carboplatin today.  Recommendations: 1. Transfer to the third floor oncology unit at [facility] 2. Staging bone scan 3. Cycle 1 etoposide/carboplatin today 4. Diflucan   LOS: 10 days   Betsy Coder, MD   07/25/2016, 8:11 AM

## 2016-07-25 NOTE — Progress Notes (Signed)
Oncology Nurse Navigator Documentation  Oncology Nurse Navigator Flowsheets 07/25/2016  Navigator Location [facility]  Navigator Encounter Type Other/I spoke with Sheri Ayala at [place] today.  She is doing well without complaints.  I updated her she will be getting her chemo today.  She states she is aware.  I updated her on side effects.  Sheri Ayala is positive chemo will help her cancer. I updated Dr. Benay Spice that I spoke with Sheri Ayala today.    Treatment Initiated Date 07/25/2016  Patient Visit Type Inpatient  Treatment Phase Treatment  Barriers/Navigation Needs Education  Education Other  Interventions Education  Acuity Level 2  Acuity Level 2 Educational needs  Time Spent with Patient 30

## 2016-07-25 NOTE — Care Management Note (Signed)
Case Management Note  Patient Details  Name: Sheri Ayala MRN: [id number] Date of Birth: 05-18-1950  Subjective/Objective: Transfer from [facility] for chemo. 66 y/o f admitted w/Respiratory failure. Hx: CLL, COPD.From home.                    Action/Plan:d/c plan home.   Expected Discharge Date:                 Expected Discharge Plan:     In-House Referral:     Discharge planning Services  CM Consult  Post Acute Care Choice:    Choice offered to:     DME Arranged:    DME Agency:     HH Arranged:    HH Agency:     Status of Service:  In process, will continue to follow  If discussed at Long Length of Stay Meetings, dates discussed:    Additional Comments:  Dessa Phi, RN 07/25/2016, 3:31 PM

## 2016-07-25 NOTE — Progress Notes (Signed)
Peripherally Inserted Central Catheter/Midline Placement  The IV Nurse has discussed with the patient and/or persons authorized to consent for the patient, the purpose of this procedure and the potential benefits and risks involved with this procedure.  The benefits include less needle sticks, lab draws from the catheter, and the patient may be discharged home with the catheter. Risks include, but not limited to, infection, bleeding, blood clot (thrombus formation), and puncture of an artery; nerve damage and irregular heartbeat and possibility to perform a PICC exchange if needed/ordered by physician.  Alternatives to this procedure were also discussed.  Bard Power PICC patient education guide, fact sheet on infection prevention and patient information card has been provided to patient /or left at bedside.    PICC/Midline Placement Documentation        Sheri Ayala 07/25/2016, 6:31 PM

## 2016-07-26 DIAGNOSIS — M5489 Other dorsalgia: Secondary | ICD-10-CM

## 2016-07-26 LAB — GLUCOSE, CAPILLARY: GLUCOSE-CAPILLARY: 112 mg/dL — AB (ref 65–99)

## 2016-07-26 MED ORDER — HOT PACK MISC ONCOLOGY
1.0000 | Freq: Once | Status: AC | PRN
  Filled 2016-07-26: qty 1

## 2016-07-26 MED ORDER — DILTIAZEM HCL ER COATED BEADS 120 MG PO CP24
120.0000 mg | ORAL_CAPSULE | Freq: Every day | ORAL | Status: DC
  Administered 2016-07-27: 120 mg via ORAL
  Filled 2016-07-26: qty 1

## 2016-07-26 MED ORDER — DILTIAZEM HCL ER COATED BEADS 120 MG PO CP24: 120.0000 mg | ORAL_CAPSULE | Freq: Every day | ORAL | 0 refills | Status: DC

## 2016-07-26 MED ORDER — SODIUM CHLORIDE 0.9 % IV SOLN
80.0000 mg/m2 | Freq: Once | INTRAVENOUS | Status: AC
  Administered 2016-07-26: 140 mg via INTRAVENOUS
  Filled 2016-07-26: qty 7

## 2016-07-26 MED ORDER — POLYETHYLENE GLYCOL 3350 17 G PO PACK: 17.0000 g | PACK | Freq: Two times a day (BID) | ORAL | 0 refills | Status: DC

## 2016-07-26 MED ORDER — SODIUM CHLORIDE 0.9% FLUSH: 10.0000 mL | INTRAVENOUS | Status: DC | PRN

## 2016-07-26 MED ORDER — SODIUM CHLORIDE 0.9 % IV SOLN
10.0000 mg | Freq: Once | INTRAVENOUS | Status: AC
  Administered 2016-07-26: 10 mg via INTRAVENOUS
  Filled 2016-07-26: qty 1

## 2016-07-26 MED ORDER — FLUCONAZOLE 50 MG PO TABS: 50.0000 mg | ORAL_TABLET | Freq: Every day | ORAL | 0 refills | Status: AC

## 2016-07-26 MED ORDER — SODIUM CHLORIDE 0.9% FLUSH: 3.0000 mL | INTRAVENOUS | Status: DC | PRN

## 2016-07-26 MED ORDER — SODIUM CHLORIDE 0.9 % IV SOLN: Freq: Once | INTRAVENOUS | Status: DC

## 2016-07-26 NOTE — Progress Notes (Signed)
Manual calculation of BSA and dosing for Etoposide completed.  Verified by Jena Gauss and Delora Fuel RN

## 2016-07-26 NOTE — Progress Notes (Signed)
IP PROGRESS NOTE  Subjective:   Sheri Ayala completed day 1 chemotherapy yesterday. No nausea/vomiting. She continues to have pain at the right upper back. The rash has improved.  Objective: Vital signs in last 24 hours: Blood pressure 97/71, pulse 79, temperature 98 F (36.7 C), temperature source Oral, resp. rate 14, height '5\' 9"'$  (1.753 m), weight 138 lb 8 oz (62.8 kg), SpO2 (!) 89 %.  Intake/Output from previous day: 04/11 0701 - 04/12 0700 In: 1235 [P.O.:590; I.V.:103; IV Piggyback:542] Out: -   Physical Exam: HEENT: No thrush Lungs: Distant breath sounds, no respiratory distress  Cardiac: Irregular Abdomen: No hepatosplenomegaly Extremities: No leg edema Skin: Erythematous maculopapular rash over the trunk-fading   Lab Results:  Recent Labs  07/24/16 0755  WBC 25.6*  HGB 12.3  HCT 41.5  PLT 108*    BMET  Recent Labs  07/24/16 0655 07/25/16 0849  NA 137 137  K 5.8* 4.6  CL 94* 92*  CO2 36* 37*  GLUCOSE 73 122*  BUN 20 25*  CREATININE 0.70 0.66  CALCIUM 8.6* 8.7*    Studies/Results: Mr Jeri Cos Wo Contrast  Result Date: 07/24/2016 CLINICAL DATA:  Newly diagnosed non-small cell lung cancer. Here for staging. History of leukemia, headache, viral encephalitis, atrial fibrillation and stroke. EXAM: MRI HEAD WITHOUT AND WITH CONTRAST TECHNIQUE: Multiplanar, multiecho pulse sequences of the brain and surrounding structures were obtained without and with intravenous contrast. CONTRAST:  76m MULTIHANCE GADOBENATE DIMEGLUMINE 529 MG/ML IV SOLN COMPARISON:  MRI of the head January 31, 2015 FINDINGS: INTRACRANIAL CONTENTS: No reduced diffusion to suggest acute ischemia, hypercellular tumor nor hyperacute demyelination. No susceptibility artifact to suggest hemorrhage. The ventricles and sulci are normal for patient's age. Patchy to confluent increased supratentorial white matter FLAIR T2 hyperintensities in a nonspecific distribution. VASCULAR: Normal major intracranial  vascular flow voids present at skull base. SKULL AND UPPER CERVICAL SPINE: Multiple new rounded foci of low T1, low T2 calvarial signal with reduced diffusion. No abnormal sellar expansion. Craniocervical junction maintained. SINUSES/ORBITS: Minimal paranasal sinus mucosal thickening with trace mastoid effusions. The included ocular globes and orbital contents are non-suspicious. OTHER: Patient is edentulous. 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 No convincing evidence of intracranial metastatic disease. Recommend bone scan or PET-CT. No acute intracranial process. Progressed nonspecific moderate to severe white matter changes. Electronically Signed   By: CElon AlasM.D.   On: 07/24/2016 19:11   Nm Bone Scan Whole Body  Result Date: 07/25/2016 CLINICAL DATA:  Small cell lung cancer EXAM: NUCLEAR MEDICINE WHOLE BODY BONE SCAN TECHNIQUE: Whole body anterior and posterior images were obtained approximately 3 hours after intravenous injection of radiopharmaceutical. RADIOPHARMACEUTICALS:  21.9 mCi Technetium-947mDP IV COMPARISON:  None. FINDINGS: No abnormal accumulation of radiotracer within the axillary or appendicular skeleton to suggest skeletal metastases. Excretory radiotracer in the bladder. IMPRESSION: No scintigraphic evidence of skeletal metastases. Electronically Signed   By: SrJulian Hy.D.   On: 07/25/2016 15:52   Dg Chest Port 1 View  Result Date: 07/25/2016 CLINICAL DATA:  [age]/o  F; line placement. EXAM: PORTABLE CHEST 1 VIEW COMPARISON:  07/24/2016 chest radiograph. FINDINGS: Rotated radiograph. Right PICC line tip projects over the lower SVC. Small right and large left pleural effusions. Left hilar mass is obscured by effusion and atelectasis. No acute osseous abnormality is evident. IMPRESSION: Right PICC line tip projects over lower SVC. Electronically Signed   By: LaKristine Garbe.D.   On: 07/25/2016 19:53   CT images reviewed Medications:  I  have reviewed the patient's current medications.  Assessment/Plan:  1. 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 COPD 3. 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 Atrial fibrillation 5. Leukocytosis secondary to CLL and steroids 6. Rash-appears to have a drug rash, could be related to antibiotics given earlier during this admission, or contrast for the brain MRI 7. Oral candidiasis-Diflucan added 07/25/2016, improved  Sheri Ayala tolerated the first cycle of chemotherapy well. She will receive day 2 etoposide today. The plan is for discharge to home after day 3 etoposide on 07/27/2016.  I discussed the diagnosis and prognosis with Sheri Ayala and her son. The brain MRI is suggested calvarial metastases, but a bone scan is negative for metastases. No other evidence of distant metastatic disease. I will review the brain MRI in radiology and decide on the need for additional imaging.  She will be scheduled for Neulasta on 07/28/2016. She will return for an office visit and nadir CBC on 08/03/2016. Leave PICC in place at discharge  Recommendations: 1. Proceed with day 2 etoposide today 2. Okay for discharge to home after day 3 etoposide 07/27/2016 3. Follow-up at the Cancer center 07/28/2016 for Neulasta and PICC flush  LOS: 11 days   Betsy Coder, MD   07/26/2016, 2:06 PM

## 2016-07-26 NOTE — Progress Notes (Addendum)
[facility] PROGRESS NOTE  Jaelen I Hagadorn TZG:[id number] DOB: [birth year] DOA: 07/15/2016  PCP: Alvester Chou, NP  Brief History/Interval Summary: 66 year old Caucasian female with a past medical history of COPD on home oxygen, CLL, atrial fibrillation not on eliquis anymore due to thrombocytopenia and epistaxis, history of stroke, anxiety and insomnia, presented with progressively worsening shortness of breath. She was found to have COPD exacerbation and was hospitalized. There was also concern for pneumonia and the patient was given antibiotics. CT scan also showed increased hilar adenopathy and mass like abnormalities. Concern for possible lung cancer. Patient underwent bronchoscopy and EBUS by Dr.Gerhardt. Pathology showed small cell lung cancer. Patient was transferred over to [facility] for chemotherapy.  Subjective/Interval History: Patient feels well. Denies any complaints. Was given the first dose of chemotherapy yesterday.  Assessment/Plan: Small cell lung cancer/Hilar adenopathy/masslike abnormalities Lung mass in 2017 was evaluated with bronchoscopy which was nondiagnostic at that time January 2018.  -Oncology/Dr.Sherril suspected lung cancer rather than CLL or lymphoma and recommended diagnostic biopsy -Cardiothoracic surgery was consulted.  -s/p bronchoscopy and EBUS 4/9 -Path revealed small cell Lung CA, MRI Brain revealed calvarial lesions. Patient then transferred over to [facility] for chemotherapy. Bone scan has been done and does not show any skeletal metastatic lesion. Patient received her first dose of chemotherapy yesterday. PICC line had to be placed.  Acute on chronic hypoxic respiratory failure secondary to COPD exacerbation, sepsis, pneumonia -Respiratory failure has resolved.  -as below  Acute COPD exacerbation with chronic hypoxic respiratory failure on 2 L oxygen at home CT chest negative for pulmonary embolism,. Mass/adenopathy as below -was on  solumedrol, ceftriaxone/zithromax, nebs, OA2 -Patient has completed course of ceftriaxone and azithromycin. -changed solumedrol to prednisone. Prednisone can be stopped as patient will be getting Decadron with chemo. -She has significantly improved. -Will need home health when ready for discharge  Sepsis secondary to pneumonia, obstructive atelectasis -Sepsis resolved. -Abx as note above  Hyperkalemia Treated with Kayexalate, recurrent. Now resolved  History of CLL with associated anemia and thrombocytopenia -Outpatient FU with Dr.Sherril  Paroxysmal afib not on anticoagulation CHA2DS2-VASc 4. Not on anticoagulation secondary to thrombocytopenia and epistaxis. Noted to be on cardizem. BP on lower side. Will decrease dose. Not on metoprolol anymore.  Constipation. -improved on bowel regimen. X-ray did not show any acute changes. TSH was checked in January and was noted to be low but free T4 was normal.  Aortic atherosclerosis Asymptomatic.  DVT Prophylaxis: SCDs    Code Status: Full code  Family Communication: Discussed with the patient, and son at bedside Disposition Plan: Patient receiving chemotherapy. Discharge home when cleared by oncology.   Consultants: Oncology. Cardiothoracic surgery.  Procedures:  VIDEO BRONCHOSCOPY WITH ENDOBRONCHIAL ULTRASOUND (N/A)  PICC line placed on 4/11  Antibiotics: Ceftriaxone and azithromycin course has been completed  Objective:  Vital Signs  Vitals:   07/25/16 1944 07/25/16 2019 07/26/16 0408 07/26/16 0843  BP:  120/75 97/71   Pulse:  95 79   Resp:  16 14   Temp:  98.7 F (37.1 C) 98 F (36.7 C)   TempSrc:  Oral Oral   SpO2: 97% 97% 100% (!) 89%  Weight:   62.8 kg (138 lb 8 oz)   Height:        Intake/Output Summary (Last 24 hours) at 07/26/16 1156 Last data filed at 07/26/16 0826  Gross per 24 hour  Intake             1635 ml  Output  0 ml  Net             1635 ml   Filed Weights   07/24/16  2030 07/25/16 0436 07/26/16 0408  Weight: 59.3 kg (130 lb 12.8 oz) 59.7 kg (131 lb 11.2 oz) 62.8 kg (138 lb 8 oz)    General appearance: alert, cooperative, appears stated age and no distress Resp: Normal effort. No wheezing, rales or rhonchi. Cardio: regular rate and rhythm, S1, S2 normal, no murmur, click, rub or gallop GI: soft, non-tender; bowel sounds normal; no masses,  no organomegaly Extremities: No edema Neurologic: Awake and alert. Oriented x 3. No focal neurological deficits are present.  Lab Results:  Data Reviewed: I have personally reviewed following labs and imaging studies  CBC:  Recent Labs Lab 07/20/16 0404 07/21/16 0319 07/23/16 0216 07/24/16 0755  WBC 19.8* 18.7* 20.5* 25.6*  HGB 11.3* 11.4* 11.7* 12.3  HCT 38.2 38.7 39.1 41.5  MCV 94.6 94.6 94.0 94.7  PLT 92* 94* 84* 108*    Basic Metabolic Panel:  Recent Labs Lab 07/20/16 0404 07/21/16 0319 07/23/16 0216 07/24/16 0655 07/25/16 0849  NA 134* 138 137 137 137  K 4.7 4.6 5.2* 5.8* 4.6  CL 93* 93* 93* 94* 92*  CO2 33* 36* 34* 36* 37*  GLUCOSE 100* 98 104* 73 122*  BUN 21* 21* 23* 20 25*  CREATININE 0.55 0.58 0.63 0.70 0.66  CALCIUM 8.4* 8.7* 9.0 8.6* 8.7*    GFR: Estimated Creatinine Clearance: 68.6 mL/min (by C-G formula based on SCr of 0.66 mg/dL).  Liver Function Tests:  Recent Labs Lab 07/23/16 0216 07/24/16 0655 07/25/16 0849  AST 48* 52* 53*  ALT 122* 122* 116*  ALKPHOS 72 71 73  BILITOT 0.5 0.6 0.9  PROT 5.1* 5.2* 5.7*  ALBUMIN 3.4* 3.4* 3.6    Coagulation Profile:  Recent Labs Lab 07/23/16 0216  INR 1.14    CBG:  Recent Labs Lab 07/21/16 0525 07/23/16 0616 07/24/16 0641 07/25/16 0752 07/26/16 0723  GLUCAP 88 84 78 79 112*    Radiology Studies: Mr Kizzie Fantasia Contrast  Result Date: 07/24/2016 CLINICAL DATA:  Newly diagnosed non-small cell lung cancer. Here for staging. History of leukemia, headache, viral encephalitis, atrial fibrillation and stroke.  EXAM: MRI HEAD WITHOUT AND WITH CONTRAST TECHNIQUE: Multiplanar, multiecho pulse sequences of the brain and surrounding structures were obtained without and with intravenous contrast. CONTRAST:  77m MULTIHANCE GADOBENATE DIMEGLUMINE 529 MG/ML IV SOLN COMPARISON:  MRI of the head January 31, 2015 FINDINGS: INTRACRANIAL CONTENTS: No reduced diffusion to suggest acute ischemia, hypercellular tumor nor hyperacute demyelination. No susceptibility artifact to suggest hemorrhage. The ventricles and sulci are normal for patient's age. Patchy to confluent increased supratentorial white matter FLAIR T2 hyperintensities in a nonspecific distribution. VASCULAR: Normal major intracranial vascular flow voids present at skull base. SKULL AND UPPER CERVICAL SPINE: Multiple new rounded foci of low T1, low T2 calvarial signal with reduced diffusion. No abnormal sellar expansion. Craniocervical junction maintained. SINUSES/ORBITS: Minimal paranasal sinus mucosal thickening with trace mastoid effusions. The included ocular globes and orbital contents are non-suspicious. OTHER: Patient is edentulous. 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 No convincing evidence of intracranial metastatic disease. Recommend bone scan or PET-CT. No acute intracranial process. Progressed nonspecific moderate to severe white matter changes. Electronically Signed   By: CElon AlasM.D.   On: 07/24/2016 19:11   Nm Bone Scan Whole Body  Result Date: 07/25/2016 CLINICAL DATA:  Small cell lung cancer EXAM: NUCLEAR MEDICINE WHOLE  BODY BONE SCAN TECHNIQUE: Whole body anterior and posterior images were obtained approximately 3 hours after intravenous injection of radiopharmaceutical. RADIOPHARMACEUTICALS:  21.9 mCi Technetium-68mMDP IV COMPARISON:  None. FINDINGS: No abnormal accumulation of radiotracer within the axillary or appendicular skeleton to suggest skeletal metastases. Excretory radiotracer in the bladder. IMPRESSION: No  scintigraphic evidence of skeletal metastases. Electronically Signed   By: SJulian HyM.D.   On: 07/25/2016 15:52   Dg Chest Port 1 View  Result Date: 07/25/2016 CLINICAL DATA:  66y/o  F; line placement. EXAM: PORTABLE CHEST 1 VIEW COMPARISON:  07/24/2016 chest radiograph. FINDINGS: Rotated radiograph. Right PICC line tip projects over the lower SVC. Small right and large left pleural effusions. Left hilar mass is obscured by effusion and atelectasis. No acute osseous abnormality is evident. IMPRESSION: Right PICC line tip projects over lower SVC. Electronically Signed   By: LKristine GarbeM.D.   On: 07/25/2016 19:53     Medications:  Scheduled: . sodium chloride   Intravenous Once  . bisacodyl  10 mg Rectal Daily  . Chlorhexidine Gluconate Cloth  6 each Topical Once  . cyclobenzaprine  5 mg Oral TID  . dexamethasone (DECADRON) IVPB [facility]  10 mg Intravenous Once  . diltiazem  180 mg Oral Daily  . etoposide  80 mg/m2 (Treatment Plan Recorded) Intravenous Once  . feeding supplement (ENSURE ENLIVE)  237 mL Oral BID BM  . fluconazole  50 mg Oral Daily  . ipratropium-albuterol  3 mL Nebulization BID  . magnesium hydroxide  30 mL Oral Daily  . mouth rinse  15 mL Mouth Rinse BID  . mometasone-formoterol  2 puff Inhalation BID  . polyethylene glycol  17 g Oral TID  . predniSONE  40 mg Oral Q breakfast  . senna  1 tablet Oral BID  . sertraline  25 mg Oral Daily  . sodium chloride flush  10-40 mL Intracatheter Q12H  . sodium chloride flush  3 mL Intravenous Q12H  . sodium chloride flush  3 mL Intravenous Q12H  . temazepam  30 mg Oral QHS   Continuous:  PDEY:CXKGYJchloride, acetaminophen **OR** acetaminophen, albuterol, ALPRAZolam, alteplase, diphenhydrAMINE, guaiFENesin-dextromethorphan, heparin lock flush, heparin lock flush, Hot Pack, HYDROcodone-acetaminophen, ondansetron **OR** ondansetron (ZOFRAN) IV, sodium chloride flush, sodium chloride flush, sodium chloride flush,  sodium chloride flush, sodium chloride flush, sodium chloride flush    LOS: 11 days   [facility] Pager [phone]/03/2017, 11:56 AM  If 7PM-7AM, please contact night-coverage at [email or web address], password [email or web address]

## 2016-07-27 ENCOUNTER — Encounter: Payer: Self-pay | Admitting: *Deleted

## 2016-07-27 DIAGNOSIS — B370 Candidal stomatitis: Secondary | ICD-10-CM

## 2016-07-27 DIAGNOSIS — R21 Rash and other nonspecific skin eruption: Secondary | ICD-10-CM

## 2016-07-27 DIAGNOSIS — C3410 Malignant neoplasm of upper lobe, unspecified bronchus or lung: Secondary | ICD-10-CM

## 2016-07-27 LAB — BASIC METABOLIC PANEL
ANION GAP: 4 — AB (ref 5–15)
BUN: 32 mg/dL — ABNORMAL HIGH (ref 6–20)
CO2: 33 mmol/L — ABNORMAL HIGH (ref 22–32)
Calcium: 8.8 mg/dL — ABNORMAL LOW (ref 8.9–10.3)
Chloride: 101 mmol/L (ref 101–111)
Creatinine, Ser: 0.54 mg/dL (ref 0.44–1.00)
GFR calc Af Amer: >60 mL/min (ref >60)
Glucose, Bld: 109 mg/dL — ABNORMAL HIGH (ref 65–99)
POTASSIUM: 5.1 mmol/L (ref 3.5–5.1)
SODIUM: 138 mmol/L (ref 135–145)

## 2016-07-27 LAB — GLUCOSE, CAPILLARY: GLUCOSE-CAPILLARY: 96 mg/dL (ref 65–99)

## 2016-07-27 LAB — CBC
HCT: 36 % (ref 36.0–46.0)
Hemoglobin: 10.8 g/dL — ABNORMAL LOW (ref 12.0–15.0)
MCH: 27.6 pg (ref 26.0–34.0)
MCHC: 30 g/dL (ref 30.0–36.0)
MCV: 91.8 fL (ref 78.0–100.0)
PLATELETS: 98 10*3/uL — AB (ref 150–400)
RBC: 3.92 MIL/uL (ref 3.87–5.11)
RDW: 15.2 % (ref 11.5–15.5)
WBC: 23.2 10*3/uL — AB (ref 4.0–10.5)

## 2016-07-27 MED ORDER — HYDROCODONE-ACETAMINOPHEN 5-325 MG PO TABS: 1.0000 | ORAL_TABLET | Freq: Four times a day (QID) | ORAL | 0 refills | Status: DC | PRN

## 2016-07-27 MED ORDER — SODIUM CHLORIDE 0.9 % IV SOLN: Freq: Once | INTRAVENOUS | Status: DC

## 2016-07-27 MED ORDER — HEPARIN SOD (PORK) LOCK FLUSH 100 UNIT/ML IV SOLN: 250.0000 [IU] | INTRAVENOUS | Status: DC | PRN

## 2016-07-27 MED ORDER — HOT PACK MISC ONCOLOGY
1.0000 | Freq: Once | Status: DC | PRN
  Filled 2016-07-27: qty 1

## 2016-07-27 MED ORDER — SODIUM CHLORIDE 0.9 % IV SOLN
80.0000 mg/m2 | Freq: Once | INTRAVENOUS | Status: AC
  Administered 2016-07-27: 140 mg via INTRAVENOUS
  Filled 2016-07-27: qty 7

## 2016-07-27 MED ORDER — SODIUM POLYSTYRENE SULFONATE 15 GM/60ML PO SUSP
15.0000 g | Freq: Once | ORAL | Status: AC
  Administered 2016-07-27: 15 g via ORAL
  Filled 2016-07-27: qty 60

## 2016-07-27 MED ORDER — SODIUM CHLORIDE 0.9 % IV SOLN
10.0000 mg | Freq: Once | INTRAVENOUS | Status: AC
  Administered 2016-07-27: 10 mg via INTRAVENOUS
  Filled 2016-07-27: qty 1

## 2016-07-27 MED ORDER — SODIUM CHLORIDE 0.9% FLUSH: 3.0000 mL | INTRAVENOUS | Status: DC | PRN

## 2016-07-27 MED ORDER — SODIUM CHLORIDE 0.9% FLUSH: 10.0000 mL | INTRAVENOUS | Status: DC | PRN

## 2016-07-27 NOTE — Progress Notes (Signed)
Oncology Nurse Navigator Documentation  Oncology Nurse Navigator Flowsheets 07/27/2016  Navigator Location [facility]  Navigator Encounter Type Other/spoke with Sheri Ayala today in the hospital.  She is doing well just a little tired.  I listened as she explained.  I updated her on follow up.  She states she has the [employer] and can get her updates through the app.  I gave her the [facility] number to call if over the weekend she was not feeling well.   I also noticed Sheri Ayala MRI Brain with possible demyelination.  I will updated Dr. Benay Spice.   Patient Visit Type Inpatient  Treatment Phase Treatment  Barriers/Navigation Needs Education;Coordination of Care  Education Other  Interventions Coordination of Care;Education  Coordination of Care Other  Acuity Level 2  Time Spent with Patient 30

## 2016-07-27 NOTE — Progress Notes (Addendum)
CM consult for [facility] for Picc line care. Pt schedule to go to [facility] tomorrow and 4/20 already for Picc flushes and Pt believes that going forward her Picc line care will be done by the [facility]. No other CM needs communicated.  Marney Doctor RN,BSN,NCM [phone]

## 2016-07-27 NOTE — Progress Notes (Signed)
Further chemo education provider on 07/26/16 to include son.  Patient remembered about hand washing and avoiding crowds of people as it relates to increased risk of infection.  Reviewed with patient and son major side effects associated with Carboplatin and Etoposide to include, but not limited to: low platelet count, low white blood cell count and low red blood cells..Provided them with the "Chemo alert card" and explained to always have it with them and present if ED visit needed. Discussed nausea and vomiting and preventive treatment measures.  Discussed fatigue, sore mouth, hair loss and reviewed  "Chemotherapy and You."  Also provided a copy of "About Your Cancer Care" and reviewed section on "When To Call."  Patient and son asked appropriate questions.

## 2016-07-27 NOTE — Progress Notes (Signed)
Nursing Discharge Summary  Patient ID: Sheri Ayala MRN: [id number] DOB/AGE: 1950-05-15 66 y.o.  Admit date: 07/15/2016 Discharge date: 07/27/2016  Discharged Condition: good  Disposition: 01-Home or Self Care  Follow-up Information    Alvester Chou, NP Follow up in 1 week(s).   Specialty:  Nurse Practitioner Contact information: [facility] [place] [id number] [place] Follow up.   Why:   follow at [place] [facility] at 12 PM 07/28/2016 for PICC line flush and Neulasta injection          Prescriptions Given: Prescription given for Vicodin and Diflucan.  Discussed medications and follow up appointments with patient.  Patient verbalized understanding without further questions.    Means of Discharge: Patient to be taken home via private vehicle to be discharged home.    Signed: Buel Ream 07/27/2016, 2:35 PM

## 2016-07-27 NOTE — Progress Notes (Signed)
IP PROGRESS NOTE  Subjective:   Sheri Ayala reports no recent complaints. No N/V/D. Pain noted in her shoulder unchanged. Day #2 of chemo was uneventful.   Objective: Vital signs in last 24 hours: Blood pressure 103/60, pulse 80, temperature 98 F (36.7 C), temperature source Oral, resp. rate 16, height '5\' 9"'$  (1.753 m), weight 134 lb 7.7 oz (61 kg), SpO2 99 %.  Intake/Output from previous day: 04/12 0701 - 04/13 0700 In: 850 [P.O.:850] Out: -   Physical Exam: A/O NAD HEENT: No thrush or ulcers.  Lungs: Distant breath sounds, no respiratory distress  Cardiac: Irregular Abdomen: No hepatosplenomegaly Extremities: No leg edema Skin: Erythematous maculopapular rash over the trunk-fading   Lab Results:  Recent Labs  07/27/16 0559  WBC 23.2*  HGB 10.8*  HCT 36.0  PLT 98*    BMET  Recent Labs  07/25/16 0849 07/27/16 0559  NA 137 138  K 4.6 5.1  CL 92* 101  CO2 37* 33*  GLUCOSE 122* 109*  BUN 25* 32*  CREATININE 0.66 0.54  CALCIUM 8.7* 8.8*      Assessment/Plan:  1. 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 COPD 3. 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 Atrial fibrillation 5. Leukocytosis secondary to CLL and steroids 6. Rash-appears to have a drug rash, could be related to antibiotics given earlier during this admission, or contrast for the brain MRI 7. Oral candidiasis-Diflucan added 07/25/2016, improved   She will be scheduled for Neulasta on 07/28/2016. She will return for an office visit and nadir CBC on 08/03/2016. Leave PICC in place at discharge  Recommendations: 1. Proceed with day 3 etoposide today 2. Okay for discharge to home today after chemotherapy is done.  3. Follow-up at the Cancer center  07/28/2016 for Neulasta and PICC flush   LOS: 12 days   Charene Mccallister, MD   07/27/2016, 8:32 AM

## 2016-07-27 NOTE — Discharge Instructions (Signed)
Follow with Primary MD Alvester Chou, NP in 7 days   Get CBC, CMP,  checked  by Primary MD next visit.    Activity: As tolerated with Full fall precautions use walker/cane & assistance as needed   Disposition Home   Diet: Heart Healthy  , with feeding assistance and aspiration precautions.   On your next visit with your primary care physician please Get Medicines reviewed and adjusted.   Please request your Prim.MD to go over all Hospital Tests and Procedure/Radiological results at the follow up, please get all Hospital records sent to your Prim MD by signing hospital release before you go home.   If you experience worsening of your admission symptoms, develop shortness of breath, life threatening emergency, suicidal or homicidal thoughts you must seek medical attention immediately by calling 911 or calling your MD immediately  if symptoms less severe.  You Must read complete instructions/literature along with all the possible adverse reactions/side effects for all the Medicines you take and that have been prescribed to you. Take any new Medicines after you have completely understood and accpet all the possible adverse reactions/side effects.   Do not drive, operating heavy machinery, perform activities at heights, swimming or participation in water activities or provide baby sitting services if your were admitted for syncope or siezures until you have seen by Primary MD or a Neurologist and advised to do so again.  Do not drive when taking Pain medications.    Do not take more than prescribed Pain, Sleep and Anxiety Medications  Special Instructions: If you have smoked or chewed Tobacco  in the last 2 yrs please stop smoking, stop any regular Alcohol  and or any Recreational drug use.  Wear Seat belts while driving.   Please note  You were cared for by a hospitalist during your hospital stay. If you have any questions about your discharge medications or the care you received  while you were in the hospital after you are discharged, you can call the unit and asked to speak with the hospitalist on call if the hospitalist that took care of you is not available. Once you are discharged, your primary care physician will handle any further medical issues. Please note that NO REFILLS for any discharge medications will be authorized once you are discharged, as it is imperative that you return to your primary care physician (or establish a relationship with a primary care physician if you do not have one) for your aftercare needs so that they can reassess your need for medications and monitor your lab values.

## 2016-07-27 NOTE — Discharge Summary (Signed)
Sheri Ayala, is a 66 y.o. female  DOB Sep 05, 1950  MRN [id number].  Admission date:  07/15/2016  Admitting Physician  Vianne Bulls, MD  Discharge Date:  07/27/2016   Primary MD  Alvester Chou, NP  Recommendations for primary care physician for things to follow:  - Please check CBC, BMP next visit, patient to follow with oncology as an outpatient regarding lab check, Neulasta injections and chemotherapy(instructed to to follow [facility] at 12 PM for Neulasta and PICC flush )   Admission Diagnosis  COPD exacerbation ([place]) [J44.1]   Discharge Diagnosis  COPD exacerbation ([place]) [J44.1]    Principal Problem:   Acute on chronic respiratory failure with hypoxia ([place]) Active Problems:   CLL (chronic lymphocytic leukemia) (H[facility])   Thrombocytopenia ([place])   COPD with acute exacerbation ([place])   Chronic a-fib ([place])   History of CVA (cerebrovascular accident)   Obstructive atelectasis   Leukocytosis   Lobar pneumonia ([place])   Hilar adenopathy   Small cell lung cancer, left ([place])      Past Medical History:  Diagnosis Date  . Allergy   . Anxiety   . Asthma   . Atrial fibrillation ([place]) 04/2016  . Bronchospasm   . Cerebrovascular accident (CVA) ([place]) 08/28/2015  . Chronic lymphocytic leukemia ([place]) 2004  . COPD (chronic obstructive pulmonary disease) ([place])   . Depression   . Emphysema of lung ([place])   . Frontal sinusitis November 2012  . H/O viral encephalitis   . Headache(784.0)   . Heart murmur   . Mitral prolapse 11/24/2015  . Mitral regurgitation 11/24/2015  . Olecranon bursitis of left elbow November 2012.   in past  . Pleurisy   . Pneumonia    13 times  . Rhinitis   . Thrombocytopenia ([place])   . Tobacco abuse   . Tricuspid valve regurgitation 11/24/2015    Past Surgical History:  Procedure Laterality Date  . ABDOMINAL HYSTERECTOMY    . COLONOSCOPY    . UPPER  GASTROINTESTINAL ENDOSCOPY    . VIDEO BRONCHOSCOPY Bilateral 04/18/2016   Procedure: VIDEO BRONCHOSCOPY WITHOUT FLUORO;  Surgeon: Tanda Rockers, MD;  Location: [facility];  Service: Cardiopulmonary;  Laterality: Bilateral;  . VIDEO BRONCHOSCOPY WITH ENDOBRONCHIAL ULTRASOUND N/A 07/23/2016   Procedure: VIDEO BRONCHOSCOPY WITH ENDOBRONCHIAL ULTRASOUND;  Surgeon: Grace Isaac, MD;  Location: [facility];  Service: Thoracic;  Laterality: N/A;       History of present illness and  Hospital Course:     Kindly see H&P for history of present illness and admission details, please review complete Labs, Consult reports and Test reports for all details in brief  HPI  from the history and physical done on the day of admission 07/15/2016  HPI: Sheri Ayala is a 66 y.o. female with medical history significant for COPD with chronic 2 L/m some low oxygen requirement, chronic lymphocytic leukemia, atrial fibrillation on Eliquis, history of CVA, anxiety, and insomnia who presents to the emergency department with 1 week of progressive  exertional dyspnea and cough. Patient reports that she had been in her usual state of health until approximately one week ago when she noted the insidious development of worsening exertional dyspnea and a cough. Over the ensuing days, the symptoms progressively worsened despite her use of home inhalers. There is been no fevers or chills during this interval and no chest pain or palpitations. She denies any lower extremity swelling or tenderness, long distance travel, prolonged immobilization, or recent sick contacts. She has not been on antibiotics or steroids for this. She denies orthopnea. Symptoms are worse with any exertion and have worsened to the point where she has been symptomatic while at rest for the past couple days, prompting her presentation to the ED tonight. She was transported by ambulance and received 125 mg IV Solu-Medrol, duo nebs, and 2 g of magnesium en route.   ED  Course: Upon arrival to the ED, patient is found to be afebrile, saturating in the high 80s, slightly tachycardic and tachypneic, and with stable blood pressure. EKG features atrial fibrillation and chest x-ray is negative for acute cardiopulmonary disease, but notable for chronic interstitial disease. Chemistry panel is unremarkable and CBC is notable for a leukocytosis to 20,900 with smudge cells, and a mild thrombocytopenia with platelets 133,000. Lactic acid was reassuring at 1.74 and troponin was negative 2. D-dimer was obtained and positive at 0.75. CTA PE study was obtained and negative for PE, but notable for postobstructive atelectasis involving the lingula and left upper lobe secondary to mediastinal and left hilar masslike abnormalities which are presumed to be adenopathy related to the patient's leukemia and increased since the prior study. Patient was given a liter of normal saline and duo nebs in the ED. She reported some subjective improvement with the breathing treatment, but continues to be working to breathe while at rest and will need to be admitted to the hospital for ongoing evaluation and management of acute exacerbation in COPD.   Hospital Course  66 year old Caucasian female with a past medical history of COPD on home oxygen, CLL, atrial fibrillation not on eliquis anymore due to thrombocytopenia and epistaxis, history of stroke, anxiety and insomnia, presented with progressively worsening shortness of breath. She was found to have COPD exacerbation and was hospitalized. There was also concern for pneumonia and the patient was given antibiotics. CT scan also showed increased hilar adenopathy and mass like abnormalities. Concern for possible lung cancer. Patient underwent bronchoscopy and EBUS by Dr.Gerhardt. Pathology showed small cell lung cancer. Patient was transferred over to [facility] for chemotherapy.  Small cell lung cancer/Hilar adenopathy/masslike  abnormalities Lung mass in 2017 was evaluated with bronchoscopy which was nondiagnostic at that time January 2018.  -Oncology/Dr.Sherril suspected lung cancer rather than CLL or lymphoma and recommended diagnostic biopsy -Cardiothoracic surgery was consulted.  -s/p bronchoscopy and EBUS 4/9 -Path revealed small cell Lung CA, MRI Brain revealed calvarial lesions. Patient then transferred over to [facility] for chemotherapy. Bone scan has been done and does not show any skeletal metastatic lesion. Patient received her first dose of chemotherapy yesterday. PICC line had to be placed. Seen by oncology, okay for discharge today, with follow-up as an outpatient regarding further chemotherapy, she is scheduled tomorrow at 12 PM for PICC line flush and Neulasta injection per oncology.  Acute on chronic hypoxic respiratory failure secondary to COPD exacerbation, sepsis, pneumonia -Respiratory failure has resolved.  -as below  AcuteCOPD exacerbation with chronic hypoxic respiratory failure on 2 L oxygen at home CT chest  negative for pulmonary embolism,. Mass/adenopathy as below -was on solumedrol, ceftriaxone/zithromax, nebs, OA2 -Patient has completed course of ceftriaxone and azithromycin. -changed solumedrol to prednisone. Prednisone can be stopped as patient will be getting Decadron with chemo. No further steroids on discharge -She has significantly improved. -Will need home health when ready for discharge  Sepsis secondary to pneumonia, obstructive atelectasis -Sepsis resolved. -Abx as note above  Hyperkalemia Treated with Kayexalate, recurrent. Now resolved  History of CLL with associated anemia and thrombocytopenia -Outpatient FU with Dr.Sherril  Paroxysmal afib not on anticoagulation CHA2DS2-VASc4. Request has been held secondary to thrombocytopenia and epistaxis. No recurrence of epistaxis for one week, patient instructed to resume her glucose at home, and if there is any  recurrence of epistaxis, she was instructed to stop it,  Noted to be on cardizem. BP on lower side. Nose has been decreased to 120 mg. Not on metoprolol anymore.  Constipation. -improved on bowel regimen. X-ray did not show any acute changes. TSH was checked in January and was noted to be low but free T4 was normal.  Aortic atherosclerosis Asymptomatic.   Discharge Condition:  Stable   Follow UP  Follow-up Information    Alvester Chou, NP Follow up in 1 week(s).   Specialty:  Nurse Practitioner Contact information: [facility] [place] [address] [address] Follow up.   Why:   [facility] 12 PM 07/28/2016 for PICC line flush and Neulasta injection            Discharge Instructions  and  Discharge Medications     Discharge Instructions    Diet - low sodium heart healthy    Complete by:  As directed    Discharge instructions    Complete by:  As directed    Follow with Primary MD Alvester Chou, NP in 7 days   Get CBC, CMP,  checked  by Primary MD next visit.    Activity: As tolerated with Full fall precautions use walker/cane & assistance as needed   Disposition Home   Diet: Heart Healthy  , with feeding assistance and aspiration precautions.   On your next visit with your primary care physician please Get Medicines reviewed and adjusted.   Please request your Prim.MD to go over all Hospital Tests and Procedure/Radiological results at the follow up, please get all Hospital records sent to your Prim MD by signing hospital release before you go home.   If you experience worsening of your admission symptoms, develop shortness of breath, life threatening emergency, suicidal or homicidal thoughts you must seek medical attention immediately by calling 911 or calling your MD immediately  if symptoms less severe.  You Must read complete  instructions/literature along with all the possible adverse reactions/side effects for all the Medicines you take and that have been prescribed to you. Take any new Medicines after you have completely understood and accpet all the possible adverse reactions/side effects.   Do not drive, operating heavy machinery, perform activities at heights, swimming or participation in water activities or provide baby sitting services if your were admitted for syncope or siezures until you have seen by Primary MD or a Neurologist and advised to do so again.  Do not drive when taking Pain medications.    Do not take more than prescribed Pain, Sleep and Anxiety Medications  Special Instructions: If you  have smoked or chewed Tobacco  in the last 2 yrs please stop smoking, stop any regular Alcohol  and or any Recreational drug use.  Wear Seat belts while driving.   Please note  You were cared for by a hospitalist during your hospital stay. If you have any questions about your discharge medications or the care you received while you were in the hospital after you are discharged, you can call the unit and asked to speak with the hospitalist on call if the hospitalist that took care of you is not available. Once you are discharged, your primary care physician will handle any further medical issues. Please note that NO REFILLS for any discharge medications will be authorized once you are discharged, as it is imperative that you return to your primary care physician (or establish a relationship with a primary care physician if you do not have one) for your aftercare needs so that they can reassess your need for medications and monitor your lab values.   Increase activity slowly    Complete by:  As directed    SCHEDULING COMMUNICATION    Complete by:  As directed    Chemotherapy Appointment  - 3 hour    SCHEDULING COMMUNICATION    Complete by:  As directed    Chemotherapy Appointment - 2 hr   SCHEDULING  COMMUNICATION    Complete by:  As directed    Chemotherapy Appointment - 2 hr   TREATMENT CONDITIONS    Complete by:  As directed    Patient should have CBC & CMP within 7 days prior to chemotherapy administration. NOTIFY MD IF: ANC < 1500, Hemoglobin < 8, PLT < 100,000,  Total Bili > 1.5, Creatinine > 1.5, ALT & AST > 80 or if patient has unstable vital signs: Temperature > 38.5, SBP > 180 or < 90, RR > 30 or HR > 100.     Allergies as of 07/27/2016      Reactions   Citrus Other (See Comments)   Burning in stomach    Doxycycline Other (See Comments)   Caused pt daily headaches to be more severe    Penicillins Hives, Nausea And Vomiting, Swelling, Other (See Comments)   Patient reports tolerating ampicillin. Has patient had a PCN reaction causing immediate rash, facial/tongue/throat swelling, SOB or lightheadedness with hypotension: Yes Has patient had a PCN reaction causing severe rash involving mucus membranes or skin necrosis: unknown Has patient had a PCN reaction that required hospitalization No Has patient had a PCN reaction occurring within the last 10 years: No If all of the above answers are "NO", then may proceed with Cephalosporin use.      Medication List    STOP taking these medications   metoprolol succinate 25 MG 24 hr tablet Commonly known as:  TOPROL-XL     TAKE these medications   albuterol (2.5 MG/3ML) 0.083% nebulizer solution Commonly known as:  PROVENTIL Take 3 mLs (2.5 mg total) by nebulization every 4 (four) hours as needed for wheezing or shortness of breath.   VENTOLIN HFA 108 (90 Base) MCG/ACT inhaler Generic drug:  albuterol Inhale 2 puffs into the lungs every 4 (four) hours as needed for wheezing or shortness of breath.   ALPRAZolam 0.5 MG tablet Commonly known as:  XANAX Take 1 tablet (0.5 mg total) by mouth 2 (two) times daily as needed for anxiety or sleep.   apixaban 5 MG Tabs tablet Commonly known as:  ELIQUIS Take 1 tablet (5 mg total)  by  mouth 2 (two) times daily.   budesonide-formoterol 160-4.5 MCG/ACT inhaler Commonly known as:  SYMBICORT Take 2 puffs first thing in am and then another 2 puffs about 12 hours later.   cyclobenzaprine 5 MG tablet Commonly known as:  FLEXERIL Take 1 tablet (5 mg total) by mouth 3 (three) times daily. What changed:  when to take this  reasons to take this   diltiazem 120 MG 24 hr capsule Commonly known as:  CARDIZEM CD Take 1 capsule (120 mg total) by mouth daily. What changed:  medication strength  how much to take   diphenhydrAMINE 25 mg capsule Commonly known as:  BENADRYL Take 1 capsule (25 mg total) by mouth every 4 (four) hours as needed (rash).   feeding supplement (ENSURE ENLIVE) Liqd Take 237 mLs by mouth 2 (two) times daily between meals.   fluconazole 50 MG tablet Commonly known as:  DIFLUCAN Take 1 tablet (50 mg total) by mouth daily.   furosemide 40 MG tablet Commonly known as:  LASIX Take 1 tablet (40 mg total) by mouth every other day.   HYDROcodone-acetaminophen 5-325 MG tablet Commonly known as:  NORCO/VICODIN Take 1 tablet by mouth every 6 (six) hours as needed for severe pain.   OXYGEN Inhale 2 L into the lungs as needed (for shortness of breath).   polyethylene glycol packet Commonly known as:  MIRALAX / GLYCOLAX Take 17 g by mouth 2 (two) times daily. What changed:  when to take this   sertraline 25 MG tablet Commonly known as:  ZOLOFT Take 25 mg by mouth daily.   temazepam 30 MG capsule Commonly known as:  RESTORIL Take 1 capsule (30 mg total) by mouth at bedtime.         Diet and Activity recommendation: See Discharge Instructions above   Consults obtained -  Oncology. Cardiothoracic surgery  Major procedures and Radiology Reports - PLEASE review detailed and final reports for all details, in brief -   VIDEO BRONCHOSCOPY WITH ENDOBRONCHIAL ULTRASOUND (N/A)   Dg Chest 2 View  Result Date: 07/24/2016 CLINICAL DATA:   Cough.  Recent pneumonia EXAM: CHEST  2 VIEW COMPARISON:  July 23, 2016 FINDINGS: There is new consolidation in the left lower lobe. There is a small pleural effusion on the left. The left upper lobe mass with consolidation in portions of the left upper lobe remain. There is a partially loculated effusion on the right with right base atelectasis. The heart size is within normal limits. No adenopathy is seen on the right; the hila is obscured by mass and volume loss on the left. Scoliosis is stable. IMPRESSION: New consolidation left lower lobe, presumably acute pneumonia. Mass with consolidation/ atelectasis in the left upper lobe persists. There are partially loculated right pleural effusion with right base atelectasis. There is a small left pleural effusion. Cardiac silhouette is stable. Electronically Signed   By: Lowella Grip III M.D.   On: 07/24/2016 08:34   Dg Chest 2 View  Result Date: 07/23/2016 CLINICAL DATA:  Preoperative evaluation.  Shortness of breath. EXAM: CHEST  2 VIEW COMPARISON:  July 19, 2016 chest radiograph and chest CT July 15, 2016 FINDINGS: There is persistent consolidation and volume loss in the left upper lobe with left hilar and mediastinal soft tissue prominence due to underlying mass and adenopathy. On the right, there is a partially loculated pleural effusion with patchy right base airspace consolidation. Heart size is normal. Pulmonary vascularity on the right appears normal. No pneumothorax. No  bone lesions. IMPRESSION: Persistent consolidation and volume loss left upper lobe with left hilar and upper lobe mass. Pleural effusion on the right appears increased in size with patchy right base infiltrate, also present on prior study. Stable cardiac silhouette. Electronically Signed   By: Lowella Grip III M.D.   On: 07/23/2016 07:30   Dg Chest 2 View  Result Date: 07/15/2016 CLINICAL DATA:  Shortness breath, cough for 1 week EXAM: CHEST  2 VIEW COMPARISON:  05/07/2016  FINDINGS: There is bilateral chronic interstitial thickening. The lungs are hyperinflated likely secondary to COPD. There is no focal parenchymal opacity. There is no pleural effusion or pneumothorax. The heart and mediastinal contours are unremarkable. The osseous structures are unremarkable. IMPRESSION: No active cardiopulmonary disease. Chronic interstitial disease. Electronically Signed   By: Kathreen Devoid   On: 07/15/2016 14:05   Ct Angio Chest Pe W And/or Wo Contrast  Result Date: 07/15/2016 CLINICAL DATA:  Leukemia and smoking history. Neck and back pain x1 week with dyspnea and nonproductive cough. EXAM: CT ANGIOGRAPHY CHEST WITH CONTRAST TECHNIQUE: Multidetector CT imaging of the chest was performed using the standard protocol during bolus administration of intravenous contrast. Multiplanar CT image reconstructions and MIPs were obtained to evaluate the vascular anatomy. CONTRAST:  100 cc Isovue 370 IV COMPARISON:  03/27/2016 FINDINGS: Cardiovascular: Normal size cardiac chambers without pericardial effusion or thickening. No aortic aneurysm or dissection. No acute pulmonary embolus. Aortic atherosclerosis. Mediastinum/Nodes: 4 mm left thyroid calcification. Confluent mediastinal and left greater than right hilar soft tissue abnormalities consistent with adenopathy possibly associated with the patient's history of leukemia. Representative left paratracheal soft tissue confluence measures 2.6 cm in thickness with confluent left hilar soft tissue masses measuring 4.6 x 2.5 cm, series 401, image 44. The soft tissue densities narrow the left main pulmonary artery and branches to the lingula and left upper lobe with narrowing but not occlusion. Circumferential narrowing of the proximal pulmonary arteries to the left lower lobe are identified about the left hilum. Lungs/Pleura: Minimal apical pleuroparenchymal scarring at the right lung apex. 4 mm left lower lobe pulmonary nodule considered present and stable.  Mild centrilobular emphysema. Opaque atelectatic appearance of the left upper lobe and lingula secondary to presumed postobstructive change from mediastinal and hilar soft tissue masses consistent with adenopathy. There is extrinsic occlusion of the bronchi to the left upper lobe, lingula and proximal left lower lobe. Trace bilateral pleural effusions with dependent atelectasis, left greater than right. Faint ground-glass opacities in the right lower lobe with areas of adjacent atelectasis and/or scarring. Small pulmonary nodular densities may be obscured by these opacities. There is mild bronchiectasis to the left lower lobe. Upper Abdomen: Stable 2.9 cm right adrenal nodule with hypo attenuation similar to prior. Negative left adrenal gland abnormality. The visualized kidneys, spleen, pancreas, stomach and liver demonstrate no acute findings. Musculoskeletal: No acute nor suspicious osseous lesions. There is mild dextroconvex curvature of the thoracic spine. Review of the MIP images confirms the above findings. IMPRESSION: 1. Postobstructive atelectasis involving the lingula and left upper lobe secondary to mediastinal and left hilar masslike abnormalities presumably representing adenopathy in this patient with history of leukemia. Confluent soft tissue masses have increased in appearance and size within the mediastinum and left hilum since prior causing postobstructive change of the left upper lobe, lingular and proximal left lower lobe bronchi. 2. Small left greater than right pleural effusions with atelectatic change. Centrilobular emphysema is stable. 3. Stable hypodense nodule of the right adrenal gland consistent with  an adenoma. 4. Centrilobular emphysema. 5. Aortic atherosclerosis without aneurysm. 6. No acute central pulmonary embolus. Electronically Signed   By: Ashley Royalty M.D.   On: 07/15/2016 18:22   Mr Jeri Cos MV Contrast  Result Date: 07/24/2016 CLINICAL DATA:  Newly diagnosed non-small cell  lung cancer. Here for staging. History of leukemia, headache, viral encephalitis, atrial fibrillation and stroke. EXAM: MRI HEAD WITHOUT AND WITH CONTRAST TECHNIQUE: Multiplanar, multiecho pulse sequences of the brain and surrounding structures were obtained without and with intravenous contrast. CONTRAST:  58m MULTIHANCE GADOBENATE DIMEGLUMINE 529 MG/ML IV SOLN COMPARISON:  MRI of the head January 31, 2015 FINDINGS: INTRACRANIAL CONTENTS: No reduced diffusion to suggest acute ischemia, hypercellular tumor nor hyperacute demyelination. No susceptibility artifact to suggest hemorrhage. The ventricles and sulci are normal for patient's age. Patchy to confluent increased supratentorial white matter FLAIR T2 hyperintensities in a nonspecific distribution. VASCULAR: Normal major intracranial vascular flow voids present at skull base. SKULL AND UPPER CERVICAL SPINE: Multiple new rounded foci of low T1, low T2 calvarial signal with reduced diffusion. No abnormal sellar expansion. Craniocervical junction maintained. SINUSES/ORBITS: Minimal paranasal sinus mucosal thickening with trace mastoid effusions. The included ocular globes and orbital contents are non-suspicious. OTHER: Patient is edentulous. 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 No convincing evidence of intracranial metastatic disease. Recommend bone scan or PET-CT. No acute intracranial process. Progressed nonspecific moderate to severe white matter changes. Electronically Signed   By: CElon AlasM.D.   On: 07/24/2016 19:11   Nm Bone Scan Whole Body  Result Date: 07/25/2016 CLINICAL DATA:  Small cell lung cancer EXAM: NUCLEAR MEDICINE WHOLE BODY BONE SCAN TECHNIQUE: Whole body anterior and posterior images were obtained approximately 3 hours after intravenous injection of radiopharmaceutical. RADIOPHARMACEUTICALS:  21.9 mCi Technetium-936mDP IV COMPARISON:  None. FINDINGS: No abnormal accumulation of radiotracer within the axillary  or appendicular skeleton to suggest skeletal metastases. Excretory radiotracer in the bladder. IMPRESSION: No scintigraphic evidence of skeletal metastases. Electronically Signed   By: SrJulian Hy.D.   On: 07/25/2016 15:52   Dg Chest Port 1 View  Result Date: 07/25/2016 CLINICAL DATA:  89/o  F; line placement. EXAM: PORTABLE CHEST 1 VIEW COMPARISON:  07/24/2016 chest radiograph. FINDINGS: Rotated radiograph. Right PICC line tip projects over the lower SVC. Small right and large left pleural effusions. Left hilar mass is obscured by effusion and atelectasis. No acute osseous abnormality is evident. IMPRESSION: Right PICC line tip projects over lower SVC. Electronically Signed   By: LaKristine Garbe.D.   On: 07/25/2016 19:53   Dg Chest Port 1 View  Result Date: 07/19/2016 CLINICAL DATA:  Two weeks of right-sided chest pain. Former long-term smoker discontinued 2 months ago. EXAM: PORTABLE CHEST 1 VIEW COMPARISON:  Chest x-ray and chest CT scan of July 15, 2016 FINDINGS: The lingular and left upper lobe collapse has progressed. There is pleural fluid versus pleural thickening in the left apex. There is a minimal shift of the mediastinum toward the left. There is patchy density at the right lung base with new small right pleural effusion. The interstitial markings elsewhere in the right lung coarse but stable. The cardiac silhouette is top-normal in size. The pulmonary vascularity is not clearly engorged there is no pneumothorax. IMPRESSION: Worsening of left upper lobe and lingular atelectasis due to known mediastinal and hilar masses. Developing left basilar atelectasis or infiltrate. Underlying COPD. Electronically Signed   By: David  [email or web address].   On: 07/19/2016 07:37   Dg Abd  Portable 1v  Result Date: 07/21/2016 CLINICAL DATA:  Constipation for 6 days. EXAM: PORTABLE ABDOMEN - 1 VIEW COMPARISON:  None. FINDINGS: Mild fecal loading in the colon with no bowel obstruction or other acute  abnormality. IMPRESSION: Mild fecal loading in the colon. Electronically Signed   By: Dorise Bullion III M.D   On: 07/21/2016 11:30    Micro Results     Recent Results (from the past 240 hour(s))  Surgical pcr screen     Status: None   Collection Time: 07/23/16  2:21 AM  Result Value Ref Range Status   MRSA, PCR NEGATIVE NEGATIVE Final   Staphylococcus aureus NEGATIVE NEGATIVE Final    Comment:        The Xpert SA Assay (FDA approved for NASAL specimens in patients over 79 years of age), is one component of a comprehensive surveillance program.  Test performance has been validated by [facility] for patients greater than or equal to 82 year old. It is not intended to diagnose infection nor to guide or monitor treatment.        Today   Subjective:   Reanne Beckett today has no headache,no chest or abdominal pain,no new weakness tingling or numbness, feels much better wants to go home today.   Objective:   Blood pressure 103/60, pulse 80, temperature 98 F (36.7 C), temperature source Oral, resp. rate 16, height '5\' 9"'$  (1.753 m), weight 61 kg (134 lb 7.7 oz), SpO2 97 %.   Intake/Output Summary (Last 24 hours) at 07/27/16 1414 Last data filed at 07/27/16 0952  Gross per 24 hour  Intake              990 ml  Output                0 ml  Net              990 ml    Exam Awake Alert, Oriented x 3,  Supple Neck,No JVD,  Symmetrical Chest wall movement, Good air movement bilaterally, CTAB RRR,No Gallops,Rubs or new Murmurs, No Parasternal Heave +ve B.Sounds, Abd Soft, Non tender,No rebound -guarding or rigidity. No Cyanosis, Clubbing or edema, No new Rash or bruise  Data Review   CBC w Diff:  Lab Results  Component Value Date   WBC 23.2 (H) 07/27/2016   HGB 10.8 (L) 07/27/2016   HGB 13.2 09/26/2015   HCT 36.0 07/27/2016   HCT 41.7 09/26/2015   PLT 98 (L) 07/27/2016   PLT 96 (L) 09/26/2015   LYMPHOPCT 52 07/15/2016   LYMPHOPCT 78.1 (H) 09/26/2015   MONOPCT 1  07/15/2016   MONOPCT 1.3 09/26/2015   EOSPCT 0 07/15/2016   EOSPCT 0.3 09/26/2015   BASOPCT 0 07/15/2016   BASOPCT 0.1 09/26/2015    CMP:  Lab Results  Component Value Date   NA 138 07/27/2016   NA 138 05/21/2016   K 5.1 07/27/2016   CL 101 07/27/2016   CO2 33 (H) 07/27/2016   BUN 32 (H) 07/27/2016   BUN 17 05/21/2016   CREATININE 0.54 07/27/2016   GLU 96 05/21/2016   PROT 5.7 (L) 07/25/2016   ALBUMIN 3.6 07/25/2016   BILITOT 0.9 07/25/2016   ALKPHOS 73 07/25/2016   AST 53 (H) 07/25/2016   ALT 116 (H) 07/25/2016  .   Total Time in preparing paper work, data evaluation and todays exam - 35 minutes  Quention Mcneill M.D on 07/27/2016 at 2:14 PM  [facility]   Office  [phone]

## 2016-07-28 ENCOUNTER — Ambulatory Visit (HOSPITAL_BASED_OUTPATIENT_CLINIC_OR_DEPARTMENT_OTHER): Payer: Medicare HMO

## 2016-07-28 ENCOUNTER — Ambulatory Visit: Payer: Medicare HMO

## 2016-07-28 VITALS — BP 144/101 | HR 110 | Temp 98.6°F | Resp 18

## 2016-07-28 DIAGNOSIS — C3492 Malignant neoplasm of unspecified part of left bronchus or lung: Secondary | ICD-10-CM

## 2016-07-28 DIAGNOSIS — Z5189 Encounter for other specified aftercare: Secondary | ICD-10-CM | POA: Diagnosis not present

## 2016-07-28 MED ORDER — PEGFILGRASTIM INJECTION 6 MG/0.6ML ~~LOC~~
6.0000 mg | PREFILLED_SYRINGE | Freq: Once | SUBCUTANEOUS | Status: AC
  Administered 2016-07-28: 6 mg via SUBCUTANEOUS

## 2016-07-28 NOTE — Op Note (Signed)
[name], Sheri Ayala NO.:  [id number]  MEDICAL RECORD NO.:  [id number]  LOCATION:  51                         FACILITY:  [facility]  PHYSICIAN:  Lanelle Bal, MD    DATE OF BIRTH:  February 22, 1951  DATE OF PROCEDURE:  07/23/2016 DATE OF DISCHARGE:                              OPERATIVE REPORT   PREOPERATIVE DIAGNOSIS:  Left hilar mass suspicious for carcinoma of the lung.  POSTOPERATIVE DIAGNOSIS:  Left hilar mass suspicious for carcinoma of the lung.  SURGICAL PROCEDURE:  Bronchoscopy with biopsy and EBUS.  SURGEON:  Lanelle Bal, MD.  BRIEF HISTORY:  The patient is a 66 year old female, who is admitted with several-month history of increasing respiratory difficulty.  She had undergone bronchoscopy in January 2018, but without any definitive diagnosis.  She now was seen by Thoracic Surgery at request of Dr. Benay Spice.  On review of the patient's scan, she appears to have a left hilar mass.  Bronchoscopy with EBUS was recommended to the patient, who agreed and signed informed consent.  DESCRIPTION OF PROCEDURE:  The patient underwent general endotracheal anesthesia without incident through 8.5 endotracheal tube.  We were able to be perform bronchoscopy.  Appropriate time-out was performed.  We then used a 2.8 mm video bronchoscope to examine the tracheobronchial tree.  The right tracheobronchial tree was without significant endobronchial lesions.  The left lower lobe bronchus was clear.  On examination of left upper lobe bronchus, there was evidence of endobronchial lesions, brushing.  Brushings of this area were obtained and sent for quick smear to Pathology.  We then removed the scope and placed EBUS scope through the endotracheal tube and targeted #7 nodes, which were easily identifiable and enlarged.  Three passes of the transbronchial biopsy needle into the #7 nodes were done.  Smears were made and material saved for cell block.  The initial quick  smears were submitted to Pathology and returned with adequate diagnostic material, initially thought to be non-small cell lung cancer.  To obtain additional tissue; however, we placed the bronchoscope back through the endotracheal tube, and multiple biopsies of the area in the left upper lobe abnormality were obtained and sent for pathology.  The patient tolerated the procedure without obvious complication.  Blood loss was minimal.  The tracheobronchial tree was suctioned of all secretions. The patient was then extubated in the operating room and transferred to the recovery room for further postoperative care, having tolerated the procedure without obvious complication.    Lanelle Bal, MD    EG/MEDQ  D:  07/28/2016  T:  07/28/2016  Job:  [id number]

## 2016-07-28 NOTE — Op Note (Deleted)
  The note originally documented on this encounter has been moved the the encounter in which it belongs.  

## 2016-08-01 ENCOUNTER — Telehealth: Payer: Self-pay | Admitting: *Deleted

## 2016-08-03 ENCOUNTER — Telehealth: Payer: Self-pay | Admitting: Nurse Practitioner

## 2016-08-03 ENCOUNTER — Ambulatory Visit (HOSPITAL_BASED_OUTPATIENT_CLINIC_OR_DEPARTMENT_OTHER): Payer: Medicare HMO | Admitting: Nurse Practitioner

## 2016-08-03 ENCOUNTER — Ambulatory Visit (HOSPITAL_BASED_OUTPATIENT_CLINIC_OR_DEPARTMENT_OTHER): Payer: Medicare HMO

## 2016-08-03 ENCOUNTER — Other Ambulatory Visit (HOSPITAL_BASED_OUTPATIENT_CLINIC_OR_DEPARTMENT_OTHER): Payer: Medicare HMO

## 2016-08-03 ENCOUNTER — Telehealth: Payer: Self-pay | Admitting: Cardiology

## 2016-08-03 VITALS — BP 105/56 | HR 107 | Temp 98.2°F | Resp 16 | Ht 69.0 in | Wt 127.3 lb

## 2016-08-03 DIAGNOSIS — C9110 Chronic lymphocytic leukemia of B-cell type not having achieved remission: Secondary | ICD-10-CM

## 2016-08-03 DIAGNOSIS — R21 Rash and other nonspecific skin eruption: Secondary | ICD-10-CM | POA: Diagnosis not present

## 2016-08-03 DIAGNOSIS — C3492 Malignant neoplasm of unspecified part of left bronchus or lung: Secondary | ICD-10-CM | POA: Diagnosis not present

## 2016-08-03 DIAGNOSIS — D72829 Elevated white blood cell count, unspecified: Secondary | ICD-10-CM

## 2016-08-03 DIAGNOSIS — M549 Dorsalgia, unspecified: Secondary | ICD-10-CM

## 2016-08-03 DIAGNOSIS — C3490 Malignant neoplasm of unspecified part of unspecified bronchus or lung: Secondary | ICD-10-CM

## 2016-08-03 DIAGNOSIS — I4891 Unspecified atrial fibrillation: Secondary | ICD-10-CM | POA: Diagnosis not present

## 2016-08-03 LAB — CBC WITH DIFFERENTIAL/PLATELET
BASO%: 0.1 % (ref 0.0–2.0)
Basophils Absolute: 0 10*3/uL (ref 0.0–0.1)
EOS%: 0.2 % (ref 0.0–7.0)
Eosinophils Absolute: 0 10*3/uL (ref 0.0–0.5)
HCT: 36.2 % (ref 34.8–46.6)
HGB: 11.3 g/dL — ABNORMAL LOW (ref 11.6–15.9)
LYMPH%: 79.1 % — AB (ref 14.0–49.7)
MCH: 28.5 pg (ref 25.1–34.0)
MCHC: 31.2 g/dL — ABNORMAL LOW (ref 31.5–36.0)
MCV: 91.4 fL (ref 79.5–101.0)
MONO#: 0.1 10*3/uL (ref 0.1–0.9)
MONO%: 0.5 % (ref 0.0–14.0)
NEUT%: 20.1 % — AB (ref 38.4–76.8)
NEUTROS ABS: 2.4 10*3/uL (ref 1.5–6.5)
Platelets: 50 10*3/uL — ABNORMAL LOW (ref 145–400)
RBC: 3.96 10*6/uL (ref 3.70–5.45)
RDW: 15.9 % — ABNORMAL HIGH (ref 11.2–14.5)
WBC: 11.9 10*3/uL — AB (ref 3.9–10.3)
lymph#: 9.4 10*3/uL — ABNORMAL HIGH (ref 0.9–3.3)

## 2016-08-03 MED ORDER — HYDROCODONE-ACETAMINOPHEN 5-325 MG PO TABS
1.0000 | ORAL_TABLET | Freq: Four times a day (QID) | ORAL | 0 refills | Status: DC | PRN
Start: 2016-08-03 — End: 2016-08-15

## 2016-08-03 MED ORDER — HEPARIN SOD (PORK) LOCK FLUSH 100 UNIT/ML IV SOLN
500.0000 [IU] | Freq: Once | INTRAVENOUS | Status: AC
  Administered 2016-08-03: 500 [IU] via INTRAVENOUS
  Filled 2016-08-03: qty 5

## 2016-08-03 MED ORDER — SODIUM CHLORIDE 0.9% FLUSH
10.0000 mL | INTRAVENOUS | Status: DC | PRN
  Administered 2016-08-03: 10 mL via INTRAVENOUS
  Filled 2016-08-03: qty 10

## 2016-08-03 NOTE — Telephone Encounter (Signed)
Gave patient AVS and calender per 4/20 los. - unable to schedule lisa appt at 1115 due to availability in the treatment area.

## 2016-08-03 NOTE — Patient Instructions (Signed)

## 2016-08-03 NOTE — Progress Notes (Signed)
[facility] OFFICE PROGRESS NOTE   Diagnosis:  Small cell lung cancer  INTERVAL HISTORY:   [name] returns as scheduled. She completed cycle 1 etoposide/carboplatin beginning 07/25/2016. She feels she tolerated the chemotherapy well. She denies nausea/vomiting. No mouth sores. She had loose stools for one week. She attributes this to taking a laxative. She denies any bleeding. No shortness of breath. She has an occasional cough. No fever. Appetite is good. She reports having a rash in the hospital. By discharge the rash was improving. 2 days ago the rash recurred. The rash is generalized. She reports having a rash to Cardizem in the past. She reports Cardizem was resumed while she was in the hospital and continued at discharge. She thinks the rash is related to Cardizem. She cannot remember the name of her cardiologist. She continues to have left back/scapula pain. She takes hydrocodone as needed and would like a refill.   Objective:  Vital signs in last 24 hours:  Blood pressure (!) 105/56, pulse (!) 107, temperature 98.2 F (36.8 C), temperature source Oral, resp. rate 16, height '5\' 9"'$  (1.753 m), weight 127 lb 4.8 oz (57.7 kg), SpO2 96 %.    HEENT: No thrush or ulcers. Lymphatics:  Resp: Lungs clear bilaterally. Cardio: Irregular. GI: Abdomen soft and nontender, No hepatomegaly. Vascular: No leg edema. Neuro: Alert and oriented.  Skin: Generalized macular/papular erythematous rash.  Right upper extremity PICC without erythema.   Lab Results:  Lab Results  Component Value Date   WBC 11.9 (H) 08/03/2016   HGB 11.3 (L) 08/03/2016   HCT 36.2 08/03/2016   MCV 91.4 08/03/2016   PLT 50 (L) 08/03/2016   NEUTROABS 2.4 08/03/2016    Imaging:  No results found.  Medications: I have reviewed the patient's current medications.  Assessment/Plan: 1. 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 COPD 3. 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 Atrial fibrillation 5. Leukocytosis secondary to CLL and steroids 6. Rash 07/25/2016-appeared to have a drug rash, could be related to antibiotics given earlier during hospital admission, or contrast for the brain MRI; recurrent rash 08/03/2016--cardizem discontinued.  7. Oral candidiasis-Diflucan added 07/25/2016, resolved   Disposition: [name] appears stable. She completed cycle 1 etoposide/carboplatin beginning 07/25/2016. She has progressive thrombocytopenia on labs today. We discussed bleeding precautions. She understands to contact the office with any bleeding. She will return for a CBC on 08/06/2016.  The rash appears consistent with a drug rash. She has had a rash with Cardizem in the past. She will discontinue Cardizem. We will contact cardiology for further recommendations regarding management of the atrial fibrillation. She will begin Benadryl. If the rash persists she will call the office.   The etiology of the back pain is unclear. Bone scan was negative for metastatic disease. We gave her a new prescription for vicodin.   She will return for a follow-up visit and cycle 2 Carboplatin/Etoposide on 08/15/2016. She will contact the office in the interim as outlined above or with any other problems.    Patient seen with Dr. Benay Spice. 25 minutes were spent face to face at today's visit with the majority of that time involved in counseling/coordination of care.     Marcello Moores,  Lattie Haw ANP/GNP-BC   08/03/2016  12:16 PM This was a shared visit with Ned Card. [name] was interviewed and examined. She completed cycle 1  etoposide/carboplatin and appears improved from a respiratory standpoint. She has a diffuse erythematous maculopapular rash with the appearance of a drug rash. This may be related to Cardizem. She will discontinue Cardizem. We will contact cardiology for recommendations on a substitute medication.  I reviewed the bone scan and brain MRI images with a radiologist today. The skull lesions are suspicious for metastases on the MRI and there appears to be an area of focal skull uptake on the bone scan.  She will return for an office visit prior to the next cycle of chemotherapy.  Julieanne Manson, M.D.

## 2016-08-03 NOTE — Telephone Encounter (Signed)
Sheri Ayala ( Dr.Sherrill) is callingto find out what medication can they start Sheri Ayala on for her AFIB . The medication( Cardizem 120 mg )  that she received for her heart is causing a full blown body rash . Can talk to Sheri Ayala or [name] , if no answer please leave a message .Marland Kitchen Thanks

## 2016-08-03 NOTE — Telephone Encounter (Signed)
Chart reviewed Patient does not seen [facility] provider per [facility] records Could not locate cardiology consult from recent hospitalization  Called Dr. Gearldine Shown office [facility]) and left message for Ned Card, NP's nurse (patient saw this provider today 08/03/16) and left message that patient does not see one of our cardiologists

## 2016-08-06 ENCOUNTER — Other Ambulatory Visit (HOSPITAL_BASED_OUTPATIENT_CLINIC_OR_DEPARTMENT_OTHER): Payer: Medicare HMO

## 2016-08-06 DIAGNOSIS — C9110 Chronic lymphocytic leukemia of B-cell type not having achieved remission: Secondary | ICD-10-CM | POA: Diagnosis not present

## 2016-08-06 DIAGNOSIS — C3492 Malignant neoplasm of unspecified part of left bronchus or lung: Secondary | ICD-10-CM | POA: Diagnosis not present

## 2016-08-06 LAB — CBC WITH DIFFERENTIAL/PLATELET
BASO%: 0.4 % (ref 0.0–2.0)
Basophils Absolute: 0.1 10*3/uL (ref 0.0–0.1)
EOS ABS: 0 10*3/uL (ref 0.0–0.5)
EOS%: 0.1 % (ref 0.0–7.0)
HCT: 36.4 % (ref 34.8–46.6)
HEMOGLOBIN: 11.5 g/dL — AB (ref 11.6–15.9)
LYMPH%: 54.8 % — ABNORMAL HIGH (ref 14.0–49.7)
MCH: 28.6 pg (ref 25.1–34.0)
MCHC: 31.7 g/dL (ref 31.5–36.0)
MCV: 90.2 fL (ref 79.5–101.0)
MONO#: 0.1 10*3/uL (ref 0.1–0.9)
MONO%: 0.5 % (ref 0.0–14.0)
NEUT%: 44.2 % (ref 38.4–76.8)
NEUTROS ABS: 7.3 10*3/uL — AB (ref 1.5–6.5)
Platelets: 103 10*3/uL — ABNORMAL LOW (ref 145–400)
RBC: 4.04 10*6/uL (ref 3.70–5.45)
RDW: 16.9 % — AB (ref 11.2–14.5)
WBC: 16.5 10*3/uL — AB (ref 3.9–10.3)
lymph#: 9 10*3/uL — ABNORMAL HIGH (ref 0.9–3.3)

## 2016-08-09 ENCOUNTER — Telehealth: Payer: Self-pay

## 2016-08-09 NOTE — Telephone Encounter (Signed)
-----   Message from Owens Shark, NP sent at 08/07/2016  4:09 PM EDT ----- Please let her know the platelet count is better. F/u as scheduled.

## 2016-08-09 NOTE — Telephone Encounter (Signed)
Called and informed pt of platelet count and to follow up as scheduled. Pt verbalizes understanding and denies any questions or concerns at this time.

## 2016-08-15 ENCOUNTER — Ambulatory Visit: Payer: Medicare HMO

## 2016-08-15 ENCOUNTER — Ambulatory Visit (HOSPITAL_BASED_OUTPATIENT_CLINIC_OR_DEPARTMENT_OTHER): Payer: Medicare HMO

## 2016-08-15 ENCOUNTER — Other Ambulatory Visit: Payer: Medicare HMO

## 2016-08-15 ENCOUNTER — Other Ambulatory Visit: Payer: Self-pay | Admitting: *Deleted

## 2016-08-15 ENCOUNTER — Ambulatory Visit (HOSPITAL_BASED_OUTPATIENT_CLINIC_OR_DEPARTMENT_OTHER): Payer: Medicare HMO | Admitting: Nurse Practitioner

## 2016-08-15 VITALS — BP 124/71 | HR 92 | Temp 99.0°F | Ht 69.0 in | Wt 127.5 lb

## 2016-08-15 DIAGNOSIS — R21 Rash and other nonspecific skin eruption: Secondary | ICD-10-CM

## 2016-08-15 DIAGNOSIS — C3492 Malignant neoplasm of unspecified part of left bronchus or lung: Secondary | ICD-10-CM

## 2016-08-15 DIAGNOSIS — C9110 Chronic lymphocytic leukemia of B-cell type not having achieved remission: Secondary | ICD-10-CM | POA: Diagnosis not present

## 2016-08-15 DIAGNOSIS — Z5111 Encounter for antineoplastic chemotherapy: Secondary | ICD-10-CM | POA: Diagnosis not present

## 2016-08-15 DIAGNOSIS — D72828 Other elevated white blood cell count: Secondary | ICD-10-CM

## 2016-08-15 DIAGNOSIS — I4891 Unspecified atrial fibrillation: Secondary | ICD-10-CM

## 2016-08-15 DIAGNOSIS — Z95828 Presence of other vascular implants and grafts: Secondary | ICD-10-CM | POA: Insufficient documentation

## 2016-08-15 LAB — COMPREHENSIVE METABOLIC PANEL
ALBUMIN: 3.4 g/dL — AB (ref 3.5–5.0)
ALK PHOS: 167 U/L — AB (ref 40–150)
ALT: 16 U/L (ref 0–55)
ANION GAP: 10 meq/L (ref 3–11)
AST: 18 U/L (ref 5–34)
BUN: 12.9 mg/dL (ref 7.0–26.0)
CO2: 28 mEq/L (ref 22–29)
Calcium: 8.7 mg/dL (ref 8.4–10.4)
Chloride: 108 mEq/L (ref 98–109)
Creatinine: 0.6 mg/dL (ref 0.6–1.1)
GLUCOSE: 86 mg/dL (ref 70–140)
Potassium: 3.7 mEq/L (ref 3.5–5.1)
SODIUM: 146 meq/L — AB (ref 136–145)
TOTAL PROTEIN: 5.5 g/dL — AB (ref 6.4–8.3)
Total Bilirubin: 0.25 mg/dL (ref 0.20–1.20)

## 2016-08-15 LAB — CBC WITH DIFFERENTIAL/PLATELET
BASO%: 0.6 % (ref 0.0–2.0)
Basophils Absolute: 0.1 10*3/uL (ref 0.0–0.1)
EOS%: 0 % (ref 0.0–7.0)
Eosinophils Absolute: 0 10*3/uL (ref 0.0–0.5)
HEMATOCRIT: 35.1 % (ref 34.8–46.6)
HGB: 11.1 g/dL — ABNORMAL LOW (ref 11.6–15.9)
LYMPH#: 6.7 10*3/uL — AB (ref 0.9–3.3)
LYMPH%: 41.8 % (ref 14.0–49.7)
MCH: 28.5 pg (ref 25.1–34.0)
MCHC: 31.6 g/dL (ref 31.5–36.0)
MCV: 89.9 fL (ref 79.5–101.0)
MONO#: 0.5 10*3/uL (ref 0.1–0.9)
MONO%: 2.9 % (ref 0.0–14.0)
NEUT#: 8.7 10*3/uL — ABNORMAL HIGH (ref 1.5–6.5)
NEUT%: 54.7 % (ref 38.4–76.8)
Platelets: 135 10*3/uL — ABNORMAL LOW (ref 145–400)
RBC: 3.9 10*6/uL (ref 3.70–5.45)
RDW: 17.9 % — ABNORMAL HIGH (ref 11.2–14.5)
WBC: 16 10*3/uL — ABNORMAL HIGH (ref 3.9–10.3)

## 2016-08-15 MED ORDER — PALONOSETRON HCL INJECTION 0.25 MG/5ML
0.2500 mg | Freq: Once | INTRAVENOUS | Status: AC
  Administered 2016-08-15: 0.25 mg via INTRAVENOUS

## 2016-08-15 MED ORDER — HYDROCODONE-ACETAMINOPHEN 5-325 MG PO TABS: 1.0000 | ORAL_TABLET | Freq: Four times a day (QID) | ORAL | 0 refills | Status: DC | PRN

## 2016-08-15 MED ORDER — SODIUM CHLORIDE 0.9% FLUSH
10.0000 mL | Freq: Once | INTRAVENOUS | Status: AC
  Administered 2016-08-15: 10 mL
  Filled 2016-08-15: qty 10

## 2016-08-15 MED ORDER — DEXAMETHASONE SODIUM PHOSPHATE 10 MG/ML IJ SOLN
INTRAMUSCULAR | Status: AC
  Filled 2016-08-15: qty 1

## 2016-08-15 MED ORDER — SODIUM CHLORIDE 0.9 % IV SOLN
386.0000 mg | Freq: Once | INTRAVENOUS | Status: AC
  Administered 2016-08-15: 390 mg via INTRAVENOUS
  Filled 2016-08-15: qty 39

## 2016-08-15 MED ORDER — SODIUM CHLORIDE 0.9 % IV SOLN
Freq: Once | INTRAVENOUS | Status: AC
  Administered 2016-08-15: 15:00:00 via INTRAVENOUS

## 2016-08-15 MED ORDER — DEXAMETHASONE SODIUM PHOSPHATE 10 MG/ML IJ SOLN
10.0000 mg | Freq: Once | INTRAMUSCULAR | Status: AC
  Administered 2016-08-15: 10 mg via INTRAVENOUS

## 2016-08-15 MED ORDER — SODIUM CHLORIDE 0.9 % IV SOLN
80.0000 mg/m2 | Freq: Once | INTRAVENOUS | Status: AC
  Administered 2016-08-15: 140 mg via INTRAVENOUS
  Filled 2016-08-15: qty 7

## 2016-08-15 MED ORDER — HEPARIN SOD (PORK) LOCK FLUSH 100 UNIT/ML IV SOLN
250.0000 [IU] | Freq: Once | INTRAVENOUS | Status: AC | PRN
  Administered 2016-08-15: 250 [IU]
  Filled 2016-08-15: qty 5

## 2016-08-15 MED ORDER — PROCHLORPERAZINE MALEATE 10 MG PO TABS: 5.0000 mg | ORAL_TABLET | Freq: Four times a day (QID) | ORAL | 1 refills | Status: DC | PRN

## 2016-08-15 MED ORDER — SODIUM CHLORIDE 0.9% FLUSH
3.0000 mL | INTRAVENOUS | Status: DC | PRN
  Administered 2016-08-15: 10 mL via INTRAVENOUS
  Filled 2016-08-15: qty 10

## 2016-08-15 MED ORDER — PALONOSETRON HCL INJECTION 0.25 MG/5ML
INTRAVENOUS | Status: AC
  Filled 2016-08-15: qty 5

## 2016-08-15 NOTE — Progress Notes (Signed)
  [place] OFFICE PROGRESS NOTE   Diagnosis:  Small cell lung cancer  INTERVAL HISTORY:   Ms. Barner returns as scheduled. She completed cycle 1 etoposide/carboplatin beginning 07/25/2016. She denies nausea/vomiting. No mouth sores. No recent loose stools. She denies shortness of breath. She has an occasional cough. No fever. The skin rash has resolved. She continues to have right shoulder pain. She takes hydrocodone as needed.  Objective:  Vital signs in last 24 hours:  Blood pressure 124/71, pulse 92, temperature 99 F (37.2 C), height '5\' 9"'$  (1.753 m), weight 127 lb 8 oz (57.8 kg), SpO2 96 %.    HEENT: No thrush or ulcers. Resp: Distant breath sounds. No respiratory distress. Cardio: Irregular. Rate within normal limits. GI: Abdomen soft and nontender. No hepatomegaly. Vascular: No leg edema. Skin: No rash. Right upper extremity PICC without erythema.    Lab Results:  Lab Results  Component Value Date   WBC 16.0 (H) 08/15/2016   HGB 11.1 (L) 08/15/2016   HCT 35.1 08/15/2016   MCV 89.9 08/15/2016   PLT 135 (L) 08/15/2016   NEUTROABS 8.7 (H) 08/15/2016    Imaging:  No results found.  Medications: I have reviewed the patient's current medications.  Assessment/Plan: 1. 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 COPD 3. 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 Atrial fibrillation.Cardizem discontinued due to a skin rash. Follow-up with cardiology. 5. Leukocytosis secondary to CLL and steroids 6. Rash 07/25/2016-appeared to have a  drug rash, could be related to antibiotics given earlier during hospital admission, or contrast for the brain MRI; recurrent rash 08/03/2016--cardizem discontinued. Rash resolved 08/15/2016. 7. Oral candidiasis-Diflucan added 07/25/2016, resolved   Disposition: Sheri Ayala appears stable. She has completed 1 cycle of etoposide/carboplatin. Plan to proceed with cycle 2 today as scheduled.  She will follow-up with cardiology regarding management of atrial fibrillation.   She will return for a follow-up visit and cycle 3 etoposide/carboplatin 3 weeks. She will contact the office in the interim with any problems.  Plan reviewed with Dr. Benay Spice.      Ned Card ANP/GNP-BC   08/15/2016  2:34 PM

## 2016-08-15 NOTE — Patient Instructions (Signed)
[facility] Discharge Instructions for Patients Receiving Chemotherapy  Today you received the following chemotherapy agents: Carboplatin and Etoposide.  To help prevent nausea and vomiting after your treatment, we encourage you to take your nausea medication as directed. If you develop nausea and vomiting that is not controlled by your nausea medication, call the clinic.   BELOW ARE SYMPTOMS THAT SHOULD BE REPORTED IMMEDIATELY:  *FEVER GREATER THAN 100.5 F  *CHILLS WITH OR WITHOUT FEVER  NAUSEA AND VOMITING THAT IS NOT CONTROLLED WITH YOUR NAUSEA MEDICATION  *UNUSUAL SHORTNESS OF BREATH  *UNUSUAL BRUISING OR BLEEDING  TENDERNESS IN MOUTH AND THROAT WITH OR WITHOUT PRESENCE OF ULCERS  *URINARY PROBLEMS  *BOWEL PROBLEMS  UNUSUAL RASH Items with * indicate a potential emergency and should be followed up as soon as possible.  Feel free to call the clinic you have any questions or concerns. The clinic phone number is [phone].  Please show the [place] at check-in to the Emergency Department and triage nurse.  Carboplatin injection What is this medicine? CARBOPLATIN (KAR boe pla tin) is a chemotherapy drug. It targets fast dividing cells, like cancer cells, and causes these cells to die. This medicine is used to treat ovarian cancer and many other cancers. This medicine may be used for other purposes; ask your health care provider or pharmacist if you have questions. COMMON BRAND NAME(S): Paraplatin What should I tell my health care provider before I take this medicine? They need to know if you have any of these conditions: -blood disorders -hearing problems -kidney disease -recent or ongoing radiation therapy -an unusual or allergic reaction to carboplatin, cisplatin, other chemotherapy, other medicines, foods, dyes, or preservatives -pregnant or trying to get pregnant -breast-feeding How should I use this medicine? This drug is usually given as  an infusion into a vein. It is administered in a hospital or clinic by a specially trained health care professional. Talk to your pediatrician regarding the use of this medicine in children. Special care may be needed. Overdosage: If you think you have taken too much of this medicine contact a poison control center or emergency room at once. NOTE: This medicine is only for you. Do not share this medicine with others. What if I miss a dose? It is important not to miss a dose. Call your doctor or health care professional if you are unable to keep an appointment. What may interact with this medicine? -medicines for seizures -medicines to increase blood counts like filgrastim, pegfilgrastim, sargramostim -some antibiotics like amikacin, gentamicin, neomycin, streptomycin, tobramycin -vaccines Talk to your doctor or health care professional before taking any of these medicines: -acetaminophen -aspirin -ibuprofen -ketoprofen -naproxen This list may not describe all possible interactions. Give your health care provider a list of all the medicines, herbs, non-prescription drugs, or dietary supplements you use. Also tell them if you smoke, drink alcohol, or use illegal drugs. Some items may interact with your medicine. What should I watch for while using this medicine? Your condition will be monitored carefully while you are receiving this medicine. You will need important blood work done while you are taking this medicine. This drug may make you feel generally unwell. This is not uncommon, as chemotherapy can affect healthy cells as well as cancer cells. Report any side effects. Continue your course of treatment even though you feel ill unless your doctor tells you to stop. In some cases, you may be given additional medicines to help with side effects. Follow all directions  for their use. Call your doctor or health care professional for advice if you get a fever, chills or sore throat, or other  symptoms of a cold or flu. Do not treat yourself. This drug decreases your body's ability to fight infections. Try to avoid being around people who are sick. This medicine may increase your risk to bruise or bleed. Call your doctor or health care professional if you notice any unusual bleeding. Be careful brushing and flossing your teeth or using a toothpick because you may get an infection or bleed more easily. If you have any dental work done, tell your dentist you are receiving this medicine. Avoid taking products that contain aspirin, acetaminophen, ibuprofen, naproxen, or ketoprofen unless instructed by your doctor. These medicines may hide a fever. Do not become pregnant while taking this medicine. Women should inform their doctor if they wish to become pregnant or think they might be pregnant. There is a potential for serious side effects to an unborn child. Talk to your health care professional or pharmacist for more information. Do not breast-feed an infant while taking this medicine. What side effects may I notice from receiving this medicine? Side effects that you should report to your doctor or health care professional as soon as possible: -allergic reactions like skin rash, itching or hives, swelling of the face, lips, or tongue -signs of infection - fever or chills, cough, sore throat, pain or difficulty passing urine -signs of decreased platelets or bleeding - bruising, pinpoint red spots on the skin, black, tarry stools, nosebleeds -signs of decreased red blood cells - unusually weak or tired, fainting spells, lightheadedness -breathing problems -changes in hearing -changes in vision -chest pain -high blood pressure -low blood counts - This drug may decrease the number of white blood cells, red blood cells and platelets. You may be at increased risk for infections and bleeding. -nausea and vomiting -pain, swelling, redness or irritation at the injection site -pain, tingling,  numbness in the hands or feet -problems with balance, talking, walking -trouble passing urine or change in the amount of urine Side effects that usually do not require medical attention (report to your doctor or health care professional if they continue or are bothersome): -hair loss -loss of appetite -metallic taste in the mouth or changes in taste This list may not describe all possible side effects. Call your doctor for medical advice about side effects. You may report side effects to FDA at 1-800-FDA-1088. Where should I keep my medicine? This drug is given in a hospital or clinic and will not be stored at home. NOTE: This sheet is a summary. It may not cover all possible information. If you have questions about this medicine, talk to your doctor, pharmacist, or health care provider.  2018 Elsevier/Gold Standard (2007-07-08 14:38:05) Etoposide, VP-16 injection What is this medicine? ETOPOSIDE, VP-16 (e toe POE side) is a chemotherapy drug. It is used to treat testicular cancer, lung cancer, and other cancers. This medicine may be used for other purposes; ask your health care provider or pharmacist if you have questions. COMMON BRAND NAME(S): Etopophos, Toposar, VePesid What should I tell my health care provider before I take this medicine? They need to know if you have any of these conditions: -infection -kidney disease -liver disease -low blood counts, like low white cell, platelet, or red cell counts -an unusual or allergic reaction to etoposide, other medicines, foods, dyes, or preservatives -pregnant or trying to get pregnant -breast-feeding How should I use this medicine? This  medicine is for infusion into a vein. It is administered in a hospital or clinic by a specially trained health care professional. Talk to your pediatrician regarding the use of this medicine in children. Special care may be needed. Overdosage: If you think you have taken too much of this medicine contact  a poison control center or emergency room at once. NOTE: This medicine is only for you. Do not share this medicine with others. What if I miss a dose? It is important not to miss your dose. Call your doctor or health care professional if you are unable to keep an appointment. What may interact with this medicine? -aspirin -certain medications for seizures like carbamazepine, phenobarbital, phenytoin, valproic acid -cyclosporine -levamisole -warfarin This list may not describe all possible interactions. Give your health care provider a list of all the medicines, herbs, non-prescription drugs, or dietary supplements you use. Also tell them if you smoke, drink alcohol, or use illegal drugs. Some items may interact with your medicine. What should I watch for while using this medicine? Visit your doctor for checks on your progress. This drug may make you feel generally unwell. This is not uncommon, as chemotherapy can affect healthy cells as well as cancer cells. Report any side effects. Continue your course of treatment even though you feel ill unless your doctor tells you to stop. In some cases, you may be given additional medicines to help with side effects. Follow all directions for their use. Call your doctor or health care professional for advice if you get a fever, chills or sore throat, or other symptoms of a cold or flu. Do not treat yourself. This drug decreases your body's ability to fight infections. Try to avoid being around people who are sick. This medicine may increase your risk to bruise or bleed. Call your doctor or health care professional if you notice any unusual bleeding. Talk to your doctor about your risk of cancer. You may be more at risk for certain types of cancers if you take this medicine. Do not become pregnant while taking this medicine or for at least 6 months after stopping it. Women should inform their doctor if they wish to become pregnant or think they might be  pregnant. Women of child-bearing potential will need to have a negative pregnancy test before starting this medicine. There is a potential for serious side effects to an unborn child. Talk to your health care professional or pharmacist for more information. Do not breast-feed an infant while taking this medicine. Men must use a latex condom during sexual contact with a woman while taking this medicine and for at least 4 months after stopping it. A latex condom is needed even if you have had a vasectomy. Contact your doctor right away if your partner becomes pregnant. Do not donate sperm while taking this medicine and for at least 4 months after you stop taking this medicine. Men should inform their doctors if they wish to father a child. This medicine may lower sperm counts. What side effects may I notice from receiving this medicine? Side effects that you should report to your doctor or health care professional as soon as possible: -allergic reactions like skin rash, itching or hives, swelling of the face, lips, or tongue -low blood counts - this medicine may decrease the number of white blood cells, red blood cells and platelets. You may be at increased risk for infections and bleeding. -signs of infection - fever or chills, cough, sore throat, pain or difficulty  passing urine -signs of decreased platelets or bleeding - bruising, pinpoint red spots on the skin, black, tarry stools, blood in the urine -signs of decreased red blood cells - unusually weak or tired, fainting spells, lightheadedness -breathing problems -changes in vision -mouth or throat sores or ulcers -pain, redness, swelling or irritation at the injection site -pain, tingling, numbness in the hands or feet -redness, blistering, peeling or loosening of the skin, including inside the mouth -seizures -vomiting Side effects that usually do not require medical attention (report to your doctor or health care professional if they continue  or are bothersome): -diarrhea -hair loss -loss of appetite -nausea -stomach pain This list may not describe all possible side effects. Call your doctor for medical advice about side effects. You may report side effects to FDA at 1-800-FDA-1088. Where should I keep my medicine? This drug is given in a hospital or clinic and will not be stored at home. NOTE: This sheet is a summary. It may not cover all possible information. If you have questions about this medicine, talk to your doctor, pharmacist, or health care provider.  2018 Elsevier/Gold Standard (2015-03-25 11:53:23)

## 2016-08-15 NOTE — Progress Notes (Signed)
Patient tolerated infusion well. Discharge instructions reviewed with patient and patient verbalized understanding. Patient stable upon discharge.

## 2016-08-16 ENCOUNTER — Ambulatory Visit (HOSPITAL_BASED_OUTPATIENT_CLINIC_OR_DEPARTMENT_OTHER): Payer: Medicare HMO

## 2016-08-16 ENCOUNTER — Telehealth: Payer: Self-pay | Admitting: Oncology

## 2016-08-16 VITALS — BP 137/86 | HR 97 | Temp 99.1°F | Resp 18

## 2016-08-16 DIAGNOSIS — Z5111 Encounter for antineoplastic chemotherapy: Secondary | ICD-10-CM | POA: Diagnosis not present

## 2016-08-16 DIAGNOSIS — C3492 Malignant neoplasm of unspecified part of left bronchus or lung: Secondary | ICD-10-CM | POA: Diagnosis not present

## 2016-08-16 MED ORDER — SODIUM CHLORIDE 0.9 % IV SOLN
Freq: Once | INTRAVENOUS | Status: AC
  Administered 2016-08-16: 14:00:00 via INTRAVENOUS

## 2016-08-16 MED ORDER — HEPARIN SOD (PORK) LOCK FLUSH 100 UNIT/ML IV SOLN
250.0000 [IU] | Freq: Once | INTRAVENOUS | Status: AC | PRN
  Administered 2016-08-16: 250 [IU]
  Filled 2016-08-16: qty 5

## 2016-08-16 MED ORDER — DEXAMETHASONE SODIUM PHOSPHATE 10 MG/ML IJ SOLN
INTRAMUSCULAR | Status: AC
  Filled 2016-08-16: qty 1

## 2016-08-16 MED ORDER — SODIUM CHLORIDE 0.9% FLUSH
10.0000 mL | INTRAVENOUS | Status: DC | PRN
  Administered 2016-08-16: 10 mL
  Filled 2016-08-16: qty 10

## 2016-08-16 MED ORDER — DEXAMETHASONE SODIUM PHOSPHATE 10 MG/ML IJ SOLN
10.0000 mg | Freq: Once | INTRAMUSCULAR | Status: AC
  Administered 2016-08-16: 10 mg via INTRAVENOUS

## 2016-08-16 MED ORDER — SODIUM CHLORIDE 0.9 % IV SOLN
80.0000 mg/m2 | Freq: Once | INTRAVENOUS | Status: AC
  Administered 2016-08-16: 140 mg via INTRAVENOUS
  Filled 2016-08-16: qty 7

## 2016-08-16 NOTE — Telephone Encounter (Signed)
Appointments complete per 5/3 los while patient in infusion. Patient to get print out in infusion.

## 2016-08-16 NOTE — Patient Instructions (Signed)
[facility] Discharge Instructions for Patients Receiving Chemotherapy  Today you received the following chemotherapy agents: Etoposide   To help prevent nausea and vomiting after your treatment, we encourage you to take your nausea medication as directed.    If you develop nausea and vomiting that is not controlled by your nausea medication, call the clinic.   BELOW ARE SYMPTOMS THAT SHOULD BE REPORTED IMMEDIATELY:  *FEVER GREATER THAN 100.5 F  *CHILLS WITH OR WITHOUT FEVER  NAUSEA AND VOMITING THAT IS NOT CONTROLLED WITH YOUR NAUSEA MEDICATION  *UNUSUAL SHORTNESS OF BREATH  *UNUSUAL BRUISING OR BLEEDING  TENDERNESS IN MOUTH AND THROAT WITH OR WITHOUT PRESENCE OF ULCERS  *URINARY PROBLEMS  *BOWEL PROBLEMS  UNUSUAL RASH Items with * indicate a potential emergency and should be followed up as soon as possible.  Feel free to call the clinic you have any questions or concerns. The clinic phone number is [phone].  Please show the CHEMO ALERT CARD at check-in to the Emergency Department and triage nurse.   

## 2016-08-17 ENCOUNTER — Ambulatory Visit (HOSPITAL_BASED_OUTPATIENT_CLINIC_OR_DEPARTMENT_OTHER): Payer: Medicare HMO

## 2016-08-17 VITALS — BP 114/71 | HR 86 | Temp 98.5°F | Resp 16

## 2016-08-17 DIAGNOSIS — Z5189 Encounter for other specified aftercare: Secondary | ICD-10-CM

## 2016-08-17 DIAGNOSIS — Z5111 Encounter for antineoplastic chemotherapy: Secondary | ICD-10-CM

## 2016-08-17 DIAGNOSIS — C3492 Malignant neoplasm of unspecified part of left bronchus or lung: Secondary | ICD-10-CM | POA: Diagnosis not present

## 2016-08-17 MED ORDER — SODIUM CHLORIDE 0.9% FLUSH
10.0000 mL | INTRAVENOUS | Status: DC | PRN
Start: 2016-08-17 — End: 2016-08-17
  Administered 2016-08-17: 10 mL
  Filled 2016-08-17: qty 10

## 2016-08-17 MED ORDER — HEPARIN SOD (PORK) LOCK FLUSH 100 UNIT/ML IV SOLN
250.0000 [IU] | Freq: Once | INTRAVENOUS | Status: AC | PRN
  Administered 2016-08-17: 250 [IU]
  Filled 2016-08-17: qty 5

## 2016-08-17 MED ORDER — SODIUM CHLORIDE 0.9 % IV SOLN
Freq: Once | INTRAVENOUS | Status: AC
  Administered 2016-08-17: 14:00:00 via INTRAVENOUS

## 2016-08-17 MED ORDER — DEXAMETHASONE SODIUM PHOSPHATE 10 MG/ML IJ SOLN
INTRAMUSCULAR | Status: AC
  Filled 2016-08-17: qty 1

## 2016-08-17 MED ORDER — DEXAMETHASONE SODIUM PHOSPHATE 10 MG/ML IJ SOLN
10.0000 mg | Freq: Once | INTRAMUSCULAR | Status: AC
  Administered 2016-08-17: 10 mg via INTRAVENOUS

## 2016-08-17 MED ORDER — SODIUM CHLORIDE 0.9 % IV SOLN
80.0000 mg/m2 | Freq: Once | INTRAVENOUS | Status: AC
  Administered 2016-08-17: 140 mg via INTRAVENOUS
  Filled 2016-08-17: qty 7

## 2016-08-17 MED ORDER — PEGFILGRASTIM 6 MG/0.6ML ~~LOC~~ PSKT
6.0000 mg | PREFILLED_SYRINGE | Freq: Once | SUBCUTANEOUS | Status: AC
  Administered 2016-08-17: 6 mg via SUBCUTANEOUS
  Filled 2016-08-17: qty 0.6

## 2016-08-17 NOTE — Patient Instructions (Signed)
[facility] Discharge Instructions for Patients Receiving Chemotherapy  Today you received the following chemotherapy agents etoposide   To help prevent nausea and vomiting after your treatment, we encourage you to take your nausea medication as directed  If you develop nausea and vomiting that is not controlled by your nausea medication, call the clinic.   BELOW ARE SYMPTOMS THAT SHOULD BE REPORTED IMMEDIATELY:  *FEVER GREATER THAN 100.5 F  *CHILLS WITH OR WITHOUT FEVER  NAUSEA AND VOMITING THAT IS NOT CONTROLLED WITH YOUR NAUSEA MEDICATION  *UNUSUAL SHORTNESS OF BREATH  *UNUSUAL BRUISING OR BLEEDING  TENDERNESS IN MOUTH AND THROAT WITH OR WITHOUT PRESENCE OF ULCERS  *URINARY PROBLEMS  *BOWEL PROBLEMS  UNUSUAL RASH Items with * indicate a potential emergency and should be followed up as soon as possible.  Feel free to call the clinic you have any questions or concerns. The clinic phone number is [phone].

## 2016-08-27 ENCOUNTER — Telehealth: Payer: Self-pay | Admitting: *Deleted

## 2016-08-27 DIAGNOSIS — C3492 Malignant neoplasm of unspecified part of left bronchus or lung: Secondary | ICD-10-CM

## 2016-08-27 MED ORDER — HYDROCODONE-ACETAMINOPHEN 5-325 MG PO TABS: 1.0000 | ORAL_TABLET | Freq: Four times a day (QID) | ORAL | 0 refills | Status: DC | PRN

## 2016-08-27 NOTE — Telephone Encounter (Signed)
Received fax from [place] to refill Hydrocodone. Telephone call to patient to verify refill needed. Pt received #20 on 08/15/16. Pt states she has "bone pain" she only takes one tablet once a day at most. Verified pt understands to come to [facility] to pick up script. Ok to refill per LT, NP.   Pt aware script ready for pick up by this afternoon.

## 2016-09-01 ENCOUNTER — Other Ambulatory Visit: Payer: Self-pay | Admitting: Oncology

## 2016-09-05 ENCOUNTER — Ambulatory Visit (HOSPITAL_BASED_OUTPATIENT_CLINIC_OR_DEPARTMENT_OTHER): Payer: Medicare HMO

## 2016-09-05 ENCOUNTER — Ambulatory Visit (HOSPITAL_BASED_OUTPATIENT_CLINIC_OR_DEPARTMENT_OTHER): Payer: Medicare HMO | Admitting: Nurse Practitioner

## 2016-09-05 ENCOUNTER — Telehealth: Payer: Self-pay | Admitting: Nurse Practitioner

## 2016-09-05 ENCOUNTER — Ambulatory Visit: Payer: Medicare HMO

## 2016-09-05 ENCOUNTER — Other Ambulatory Visit (HOSPITAL_BASED_OUTPATIENT_CLINIC_OR_DEPARTMENT_OTHER): Payer: Medicare HMO

## 2016-09-05 VITALS — BP 126/83 | HR 91 | Temp 98.7°F | Resp 20 | Ht 69.0 in | Wt 127.3 lb

## 2016-09-05 DIAGNOSIS — Z5111 Encounter for antineoplastic chemotherapy: Secondary | ICD-10-CM

## 2016-09-05 DIAGNOSIS — C3492 Malignant neoplasm of unspecified part of left bronchus or lung: Secondary | ICD-10-CM | POA: Diagnosis not present

## 2016-09-05 DIAGNOSIS — Z452 Encounter for adjustment and management of vascular access device: Secondary | ICD-10-CM

## 2016-09-05 DIAGNOSIS — C9110 Chronic lymphocytic leukemia of B-cell type not having achieved remission: Secondary | ICD-10-CM

## 2016-09-05 LAB — CBC WITH DIFFERENTIAL/PLATELET
BASO%: 0.3 % (ref 0.0–2.0)
Basophils Absolute: 0 10*3/uL (ref 0.0–0.1)
EOS ABS: 0.1 10*3/uL (ref 0.0–0.5)
EOS%: 0.4 % (ref 0.0–7.0)
HEMATOCRIT: 35.7 % (ref 34.8–46.6)
HEMOGLOBIN: 11.2 g/dL — AB (ref 11.6–15.9)
LYMPH#: 5.7 10*3/uL — AB (ref 0.9–3.3)
LYMPH%: 42.8 % (ref 14.0–49.7)
MCH: 29.3 pg (ref 25.1–34.0)
MCHC: 31.4 g/dL — ABNORMAL LOW (ref 31.5–36.0)
MCV: 93.5 fL (ref 79.5–101.0)
MONO#: 0.5 10*3/uL (ref 0.1–0.9)
MONO%: 3.8 % (ref 0.0–14.0)
NEUT%: 52.7 % (ref 38.4–76.8)
NEUTROS ABS: 7 10*3/uL — AB (ref 1.5–6.5)
Platelets: 147 10*3/uL (ref 145–400)
RBC: 3.82 10*6/uL (ref 3.70–5.45)
RDW: 18.9 % — AB (ref 11.2–14.5)
WBC: 13.3 10*3/uL — AB (ref 3.9–10.3)

## 2016-09-05 LAB — COMPREHENSIVE METABOLIC PANEL
ALBUMIN: 3.6 g/dL (ref 3.5–5.0)
ALT: 15 U/L (ref 0–55)
AST: 17 U/L (ref 5–34)
Alkaline Phosphatase: 169 U/L — ABNORMAL HIGH (ref 40–150)
Anion Gap: 9 mEq/L (ref 3–11)
BILIRUBIN TOTAL: 0.24 mg/dL (ref 0.20–1.20)
BUN: 17.8 mg/dL (ref 7.0–26.0)
CALCIUM: 8.8 mg/dL (ref 8.4–10.4)
CO2: 27 mEq/L (ref 22–29)
Chloride: 110 mEq/L — ABNORMAL HIGH (ref 98–109)
Creatinine: 0.6 mg/dL (ref 0.6–1.1)
GLUCOSE: 87 mg/dL (ref 70–140)
Potassium: 3.7 mEq/L (ref 3.5–5.1)
SODIUM: 146 meq/L — AB (ref 136–145)
TOTAL PROTEIN: 5.4 g/dL — AB (ref 6.4–8.3)

## 2016-09-05 MED ORDER — PALONOSETRON HCL INJECTION 0.25 MG/5ML
INTRAVENOUS | Status: AC
  Filled 2016-09-05: qty 5

## 2016-09-05 MED ORDER — SODIUM CHLORIDE 0.9% FLUSH
10.0000 mL | Freq: Once | INTRAVENOUS | Status: AC
  Administered 2016-09-05: 10 mL
  Filled 2016-09-05: qty 10

## 2016-09-05 MED ORDER — SODIUM CHLORIDE 0.9 % IV SOLN
80.0000 mg/m2 | Freq: Once | INTRAVENOUS | Status: AC
  Administered 2016-09-05: 140 mg via INTRAVENOUS
  Filled 2016-09-05: qty 7

## 2016-09-05 MED ORDER — HEPARIN SOD (PORK) LOCK FLUSH 100 UNIT/ML IV SOLN
250.0000 [IU] | INTRAVENOUS | Status: AC | PRN
  Administered 2016-09-05: 250 [IU]
  Filled 2016-09-05: qty 5

## 2016-09-05 MED ORDER — DEXAMETHASONE SODIUM PHOSPHATE 10 MG/ML IJ SOLN
10.0000 mg | Freq: Once | INTRAMUSCULAR | Status: AC
  Administered 2016-09-05: 10 mg via INTRAVENOUS

## 2016-09-05 MED ORDER — TRAMADOL HCL 50 MG PO TABS: 50.0000 mg | ORAL_TABLET | Freq: Three times a day (TID) | ORAL | 0 refills | Status: DC | PRN

## 2016-09-05 MED ORDER — SODIUM CHLORIDE 0.9 % IV SOLN
Freq: Once | INTRAVENOUS | Status: AC
  Administered 2016-09-05: 12:00:00 via INTRAVENOUS

## 2016-09-05 MED ORDER — SODIUM CHLORIDE 0.9 % IV SOLN
386.0000 mg | Freq: Once | INTRAVENOUS | Status: AC
  Administered 2016-09-05: 390 mg via INTRAVENOUS
  Filled 2016-09-05: qty 39

## 2016-09-05 MED ORDER — SODIUM CHLORIDE 0.9% FLUSH
10.0000 mL | INTRAVENOUS | Status: AC | PRN
  Administered 2016-09-05: 10 mL
  Filled 2016-09-05: qty 10

## 2016-09-05 MED ORDER — SODIUM CHLORIDE 0.9% FLUSH
10.0000 mL | INTRAVENOUS | Status: DC | PRN
Start: 2016-09-05 — End: 2016-09-05
  Filled 2016-09-05: qty 10

## 2016-09-05 MED ORDER — HEPARIN SOD (PORK) LOCK FLUSH 100 UNIT/ML IV SOLN
250.0000 [IU] | Freq: Once | INTRAVENOUS | Status: DC | PRN
  Filled 2016-09-05: qty 5

## 2016-09-05 MED ORDER — DEXAMETHASONE SODIUM PHOSPHATE 10 MG/ML IJ SOLN
INTRAMUSCULAR | Status: AC
  Filled 2016-09-05: qty 1

## 2016-09-05 MED ORDER — PALONOSETRON HCL INJECTION 0.25 MG/5ML
0.2500 mg | Freq: Once | INTRAVENOUS | Status: AC
  Administered 2016-09-05: 0.25 mg via INTRAVENOUS

## 2016-09-05 NOTE — Telephone Encounter (Signed)
Gave patient AVS and calender per 5/23 los

## 2016-09-05 NOTE — Patient Instructions (Signed)
[facility] Discharge Instructions for Patients Receiving Chemotherapy  Today you received the following chemotherapy agents Etoposide (VP 16), Carboplatin To help prevent nausea and vomiting after your treatment, we encourage you to take your nausea medication as prescribed.   If you develop nausea and vomiting that is not controlled by your nausea medication, call the clinic.   BELOW ARE SYMPTOMS THAT SHOULD BE REPORTED IMMEDIATELY:  *FEVER GREATER THAN 100.5 F  *CHILLS WITH OR WITHOUT FEVER  NAUSEA AND VOMITING THAT IS NOT CONTROLLED WITH YOUR NAUSEA MEDICATION  *UNUSUAL SHORTNESS OF BREATH  *UNUSUAL BRUISING OR BLEEDING  TENDERNESS IN MOUTH AND THROAT WITH OR WITHOUT PRESENCE OF ULCERS  *URINARY PROBLEMS  *BOWEL PROBLEMS  UNUSUAL RASH Items with * indicate a potential emergency and should be followed up as soon as possible.  Feel free to call the clinic you have any questions or concerns. The clinic phone number is [phone].  Please show the [place] at check-in to the Emergency Department and triage nurse.

## 2016-09-05 NOTE — Progress Notes (Signed)
  [facility] OFFICE PROGRESS NOTE   Diagnosis:  Small cell lung cancer  INTERVAL HISTORY:   Ms. Estey returns as scheduled. She completed cycle 2 etoposide/carboplatin beginning 08/15/2016. She had nausea with dry heaves and diarrhea beginning day 4. This lasted for about 2 days. She thinks it may have been related to the neulasta onpro injection. No mouth sores. She denies shortness of breath. She continues to have "achy" pain at the right scapula. The pain is position related. The pain is unchanged over several months. She would like something different than Vicodin due to nausea.  Objective:  Vital signs in last 24 hours:  Blood pressure 126/83, pulse 91, temperature 98.7 F (37.1 C), resp. rate 20, height 5\' 9"  (1.753 m), weight 127 lb 4.8 oz (57.7 kg), SpO2 99 %.    HEENT: No thrush or ulcers. Resp: Distant breath sounds. Cardio: Irregular. Rate within normal limits. GI: Abdomen soft and nontender. No hepatomegaly. Vascular: No leg edema. Skin: No rash. Right upper extremity PICC without erythema.    Lab Results:  Lab Results  Component Value Date   WBC 13.3 (H) 09/05/2016   HGB 11.2 (L) 09/05/2016   HCT 35.7 09/05/2016   MCV 93.5 09/05/2016   PLT 147 09/05/2016   NEUTROABS 7.0 (H) 09/05/2016    Imaging:  No results found.  Medications: I have reviewed the patient's current medications.  Assessment/Plan: 1. 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 COPD 3. 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 Atrial fibrillation.Cardizem discontinued due to a skin rash. Follow-up with cardiology. 5. Leukocytosis secondary to CLL and steroids 6. Rash 07/25/2016-appearedto have a drug rash, could be related to antibiotics given earlier duringhospital admission, or contrast for the brain MRI; recurrent rash 08/03/2016--cardizem discontinued. Rash resolved 08/15/2016. 7. Oral candidiasis-Diflucan added 07/25/2016, resolved   Disposition: Ms. Mccampbell appears stable. She has completed 2 cycles of etoposide/carboplatin. Plan to proceed with cycle 3 today as scheduled. She will have a restaging chest CT a few days prior to her next visit.  The etiology of the right upper back/scapula pain is unclear. We changed her pain medication from Vicodin to tramadol. We will follow-up on the restaging chest CT.  She had nausea and diarrhea following the Neulasta onpro injection last cycle. She will receive Neulasta day 4 with this cycle.  She currently has a PICC line. Due to distance/transportation she is not able to come in for PICC care. The PICC will be removed on 09/07/2016.  She will return for a follow-up visit in 3 weeks. She will contact the office in the interim with any problems.  Plan reviewed with Dr. Benay Spice.    Ned Card ANP/GNP-BC   09/05/2016  11:08 AM

## 2016-09-06 ENCOUNTER — Ambulatory Visit (HOSPITAL_BASED_OUTPATIENT_CLINIC_OR_DEPARTMENT_OTHER): Payer: Medicare HMO

## 2016-09-06 VITALS — BP 123/62 | HR 93 | Temp 99.1°F | Resp 20

## 2016-09-06 DIAGNOSIS — C3492 Malignant neoplasm of unspecified part of left bronchus or lung: Secondary | ICD-10-CM | POA: Diagnosis not present

## 2016-09-06 DIAGNOSIS — Z5111 Encounter for antineoplastic chemotherapy: Secondary | ICD-10-CM

## 2016-09-06 MED ORDER — SODIUM CHLORIDE 0.9% FLUSH
10.0000 mL | INTRAVENOUS | Status: DC | PRN
  Administered 2016-09-06: 10 mL
  Filled 2016-09-06: qty 10

## 2016-09-06 MED ORDER — DEXAMETHASONE SODIUM PHOSPHATE 10 MG/ML IJ SOLN
10.0000 mg | Freq: Once | INTRAMUSCULAR | Status: AC
  Administered 2016-09-06: 10 mg via INTRAVENOUS

## 2016-09-06 MED ORDER — SODIUM CHLORIDE 0.9 % IV SOLN
80.0000 mg/m2 | Freq: Once | INTRAVENOUS | Status: AC
  Administered 2016-09-06: 140 mg via INTRAVENOUS
  Filled 2016-09-06: qty 7

## 2016-09-06 MED ORDER — HEPARIN SOD (PORK) LOCK FLUSH 100 UNIT/ML IV SOLN
250.0000 [IU] | Freq: Once | INTRAVENOUS | Status: AC | PRN
  Administered 2016-09-06: 250 [IU]
  Filled 2016-09-06: qty 5

## 2016-09-06 MED ORDER — DEXAMETHASONE SODIUM PHOSPHATE 10 MG/ML IJ SOLN
INTRAMUSCULAR | Status: AC
  Filled 2016-09-06: qty 1

## 2016-09-06 MED ORDER — SODIUM CHLORIDE 0.9 % IV SOLN
Freq: Once | INTRAVENOUS | Status: AC
  Administered 2016-09-06: 14:00:00 via INTRAVENOUS

## 2016-09-06 NOTE — Patient Instructions (Signed)
[place] [facility] Discharge Instructions for Patients Receiving Chemotherapy  Today you received the following chemotherapy agents:  Etoposide.  To help prevent nausea and vomiting after your treatment, we encourage you to take your nausea medication as directed.   If you develop nausea and vomiting that is not controlled by your nausea medication, call the clinic.   BELOW ARE SYMPTOMS THAT SHOULD BE REPORTED IMMEDIATELY:  *FEVER GREATER THAN 100.5 F  *CHILLS WITH OR WITHOUT FEVER  NAUSEA AND VOMITING THAT IS NOT CONTROLLED WITH YOUR NAUSEA MEDICATION  *UNUSUAL SHORTNESS OF BREATH  *UNUSUAL BRUISING OR BLEEDING  TENDERNESS IN MOUTH AND THROAT WITH OR WITHOUT PRESENCE OF ULCERS  *URINARY PROBLEMS  *BOWEL PROBLEMS  UNUSUAL RASH Items with * indicate a potential emergency and should be followed up as soon as possible.  Feel free to call the clinic you have any questions or concerns. The clinic phone number is [phone].  Please show the [place] at check-in to the Emergency Department and triage nurse.

## 2016-09-07 ENCOUNTER — Ambulatory Visit (HOSPITAL_BASED_OUTPATIENT_CLINIC_OR_DEPARTMENT_OTHER): Payer: Medicare HMO

## 2016-09-07 VITALS — BP 116/62 | HR 83 | Temp 98.5°F | Resp 16

## 2016-09-07 DIAGNOSIS — C3492 Malignant neoplasm of unspecified part of left bronchus or lung: Secondary | ICD-10-CM | POA: Diagnosis not present

## 2016-09-07 DIAGNOSIS — Z5112 Encounter for antineoplastic immunotherapy: Secondary | ICD-10-CM | POA: Diagnosis not present

## 2016-09-07 MED ORDER — SODIUM CHLORIDE 0.9 % IV SOLN
Freq: Once | INTRAVENOUS | Status: AC
  Administered 2016-09-07: 14:00:00 via INTRAVENOUS

## 2016-09-07 MED ORDER — SODIUM CHLORIDE 0.9 % IV SOLN
80.0000 mg/m2 | Freq: Once | INTRAVENOUS | Status: AC
  Administered 2016-09-07: 140 mg via INTRAVENOUS
  Filled 2016-09-07: qty 7

## 2016-09-07 MED ORDER — DEXAMETHASONE SODIUM PHOSPHATE 10 MG/ML IJ SOLN
10.0000 mg | Freq: Once | INTRAMUSCULAR | Status: AC
Start: 2016-09-07 — End: 2016-09-07
  Administered 2016-09-07: 10 mg via INTRAVENOUS

## 2016-09-07 MED ORDER — HEPARIN SOD (PORK) LOCK FLUSH 100 UNIT/ML IV SOLN
250.0000 [IU] | Freq: Once | INTRAVENOUS | Status: DC | PRN
  Filled 2016-09-07: qty 5

## 2016-09-07 MED ORDER — DEXAMETHASONE SODIUM PHOSPHATE 10 MG/ML IJ SOLN
INTRAMUSCULAR | Status: AC
  Filled 2016-09-07: qty 1

## 2016-09-07 MED ORDER — SODIUM CHLORIDE 0.9% FLUSH
10.0000 mL | INTRAVENOUS | Status: DC | PRN
  Administered 2016-09-07: 10 mL
  Filled 2016-09-07: qty 10

## 2016-09-07 NOTE — Patient Instructions (Signed)
[facility] Discharge Instructions for Patients Receiving Chemotherapy  Today you received the following chemotherapy agents:  etoposide (Vepesid).  To help prevent nausea and vomiting after your treatment, we encourage you to take your nausea medication as directed.   If you develop nausea and vomiting that is not controlled by your nausea medication, call the clinic.   BELOW ARE SYMPTOMS THAT SHOULD BE REPORTED IMMEDIATELY:  *FEVER GREATER THAN 100.5 F  *CHILLS WITH OR WITHOUT FEVER  NAUSEA AND VOMITING THAT IS NOT CONTROLLED WITH YOUR NAUSEA MEDICATION  *UNUSUAL SHORTNESS OF BREATH  *UNUSUAL BRUISING OR BLEEDING  TENDERNESS IN MOUTH AND THROAT WITH OR WITHOUT PRESENCE OF ULCERS  *URINARY PROBLEMS  *BOWEL PROBLEMS  UNUSUAL RASH Items with * indicate a potential emergency and should be followed up as soon as possible.  Feel free to call the clinic you have any questions or concerns. The clinic phone number is [phone].  Please show the [place] at check-in to the Emergency Department and triage nurse.

## 2016-09-07 NOTE — Progress Notes (Signed)
R PICC removed at 1603. Pressure held for 53min until 1608. Pt was laid flat during PICC removal and 10min post observation. VSS upon completion of observation time. Pt reminded to not shower, lift objects greater than 10lb, or remove the dressing before 24 hours. Pt verbalized understanding.

## 2016-09-08 ENCOUNTER — Ambulatory Visit (HOSPITAL_BASED_OUTPATIENT_CLINIC_OR_DEPARTMENT_OTHER): Payer: Medicare HMO

## 2016-09-08 VITALS — BP 107/73 | HR 103 | Temp 98.3°F | Resp 18

## 2016-09-08 DIAGNOSIS — Z5189 Encounter for other specified aftercare: Secondary | ICD-10-CM

## 2016-09-08 DIAGNOSIS — C3492 Malignant neoplasm of unspecified part of left bronchus or lung: Secondary | ICD-10-CM

## 2016-09-08 MED ORDER — PEGFILGRASTIM INJECTION 6 MG/0.6ML ~~LOC~~
6.0000 mg | PREFILLED_SYRINGE | Freq: Once | SUBCUTANEOUS | Status: AC
  Administered 2016-09-08: 6 mg via SUBCUTANEOUS

## 2016-09-23 ENCOUNTER — Other Ambulatory Visit: Payer: Self-pay | Admitting: Oncology

## 2016-09-24 ENCOUNTER — Ambulatory Visit
Admission: RE | Admit: 2016-09-24 | Discharge: 2016-09-24 | Disposition: A | Discharge status: Home / Self Care | Payer: Medicare HMO | Source: Ambulatory Visit | Attending: Nurse Practitioner | Admitting: Nurse Practitioner

## 2016-09-24 DIAGNOSIS — C9190 Lymphoid leukemia, unspecified not having achieved remission: Secondary | ICD-10-CM | POA: Insufficient documentation

## 2016-09-24 DIAGNOSIS — C3492 Malignant neoplasm of unspecified part of left bronchus or lung: Secondary | ICD-10-CM | POA: Diagnosis not present

## 2016-09-24 DIAGNOSIS — I700 Atherosclerosis of aorta: Secondary | ICD-10-CM | POA: Insufficient documentation

## 2016-09-24 DIAGNOSIS — E279 Disorder of adrenal gland, unspecified: Secondary | ICD-10-CM | POA: Insufficient documentation

## 2016-09-24 DIAGNOSIS — J439 Emphysema, unspecified: Secondary | ICD-10-CM | POA: Diagnosis not present

## 2016-09-24 DIAGNOSIS — I2510 Atherosclerotic heart disease of native coronary artery without angina pectoris: Secondary | ICD-10-CM | POA: Insufficient documentation

## 2016-09-24 DIAGNOSIS — C9110 Chronic lymphocytic leukemia of B-cell type not having achieved remission: Secondary | ICD-10-CM

## 2016-09-24 MED ORDER — IOPAMIDOL (ISOVUE-300) INJECTION 61%
75.0000 mL | Freq: Once | INTRAVENOUS | Status: AC | PRN
  Administered 2016-09-24: 75 mL via INTRAVENOUS

## 2016-09-25 ENCOUNTER — Other Ambulatory Visit: Payer: Self-pay | Admitting: *Deleted

## 2016-09-25 DIAGNOSIS — C3492 Malignant neoplasm of unspecified part of left bronchus or lung: Secondary | ICD-10-CM

## 2016-09-26 ENCOUNTER — Telehealth: Payer: Self-pay | Admitting: Oncology

## 2016-09-26 ENCOUNTER — Other Ambulatory Visit (HOSPITAL_BASED_OUTPATIENT_CLINIC_OR_DEPARTMENT_OTHER): Payer: Medicare HMO

## 2016-09-26 ENCOUNTER — Ambulatory Visit: Payer: Medicare HMO

## 2016-09-26 ENCOUNTER — Ambulatory Visit (HOSPITAL_BASED_OUTPATIENT_CLINIC_OR_DEPARTMENT_OTHER): Payer: Medicare HMO

## 2016-09-26 ENCOUNTER — Ambulatory Visit (HOSPITAL_BASED_OUTPATIENT_CLINIC_OR_DEPARTMENT_OTHER): Payer: Medicare HMO | Admitting: Oncology

## 2016-09-26 VITALS — BP 107/86 | HR 100 | Temp 98.6°F | Resp 18 | Ht 69.0 in | Wt 125.4 lb

## 2016-09-26 DIAGNOSIS — C3412 Malignant neoplasm of upper lobe, left bronchus or lung: Secondary | ICD-10-CM

## 2016-09-26 DIAGNOSIS — C9110 Chronic lymphocytic leukemia of B-cell type not having achieved remission: Secondary | ICD-10-CM

## 2016-09-26 DIAGNOSIS — Z5111 Encounter for antineoplastic chemotherapy: Secondary | ICD-10-CM | POA: Diagnosis not present

## 2016-09-26 DIAGNOSIS — C3492 Malignant neoplasm of unspecified part of left bronchus or lung: Secondary | ICD-10-CM

## 2016-09-26 DIAGNOSIS — I4891 Unspecified atrial fibrillation: Secondary | ICD-10-CM | POA: Diagnosis not present

## 2016-09-26 LAB — CBC WITH DIFFERENTIAL/PLATELET
BASO%: 0.3 % (ref 0.0–2.0)
Basophils Absolute: 0.1 10*3/uL (ref 0.0–0.1)
EOS ABS: 0 10*3/uL (ref 0.0–0.5)
EOS%: 0 % (ref 0.0–7.0)
HEMATOCRIT: 40.1 % (ref 34.8–46.6)
HEMOGLOBIN: 12.8 g/dL (ref 11.6–15.9)
LYMPH#: 9.1 10*3/uL — AB (ref 0.9–3.3)
LYMPH%: 48.6 % (ref 14.0–49.7)
MCH: 30 pg (ref 25.1–34.0)
MCHC: 31.9 g/dL (ref 31.5–36.0)
MCV: 93.9 fL (ref 79.5–101.0)
MONO#: 0.5 10*3/uL (ref 0.1–0.9)
MONO%: 2.7 % (ref 0.0–14.0)
NEUT#: 9.1 10*3/uL — ABNORMAL HIGH (ref 1.5–6.5)
NEUT%: 48.4 % (ref 38.4–76.8)
PLATELETS: 178 10*3/uL (ref 145–400)
RBC: 4.27 10*6/uL (ref 3.70–5.45)
RDW: 19.5 % — AB (ref 11.2–14.5)
WBC: 18.8 10*3/uL — ABNORMAL HIGH (ref 3.9–10.3)

## 2016-09-26 LAB — COMPREHENSIVE METABOLIC PANEL
ALBUMIN: 3.8 g/dL (ref 3.5–5.0)
ALK PHOS: 152 U/L — AB (ref 40–150)
ALT: 13 U/L (ref 0–55)
ANION GAP: 10 meq/L (ref 3–11)
AST: 15 U/L (ref 5–34)
BILIRUBIN TOTAL: 0.26 mg/dL (ref 0.20–1.20)
BUN: 22.9 mg/dL (ref 7.0–26.0)
CO2: 24 mEq/L (ref 22–29)
Calcium: 9.2 mg/dL (ref 8.4–10.4)
Chloride: 107 mEq/L (ref 98–109)
Creatinine: 0.7 mg/dL (ref 0.6–1.1)
EGFR: 85 mL/min/{1.73_m2} — ABNORMAL LOW (ref >90)
GLUCOSE: 84 mg/dL (ref 70–140)
Potassium: 4.3 mEq/L (ref 3.5–5.1)
SODIUM: 142 meq/L (ref 136–145)
TOTAL PROTEIN: 5.9 g/dL — AB (ref 6.4–8.3)

## 2016-09-26 MED ORDER — DEXAMETHASONE SODIUM PHOSPHATE 10 MG/ML IJ SOLN
10.0000 mg | Freq: Once | INTRAMUSCULAR | Status: AC
  Administered 2016-09-26: 10 mg via INTRAVENOUS

## 2016-09-26 MED ORDER — HYDROCODONE-ACETAMINOPHEN 5-325 MG PO TABS: 1.0000 | ORAL_TABLET | Freq: Four times a day (QID) | ORAL | 0 refills | Status: DC | PRN

## 2016-09-26 MED ORDER — SODIUM CHLORIDE 0.9 % IV SOLN
80.0000 mg/m2 | Freq: Once | INTRAVENOUS | Status: AC
  Administered 2016-09-26: 140 mg via INTRAVENOUS
  Filled 2016-09-26: qty 7

## 2016-09-26 MED ORDER — PALONOSETRON HCL INJECTION 0.25 MG/5ML
INTRAVENOUS | Status: AC
  Filled 2016-09-26: qty 5

## 2016-09-26 MED ORDER — PALONOSETRON HCL INJECTION 0.25 MG/5ML
0.2500 mg | Freq: Once | INTRAVENOUS | Status: AC
  Administered 2016-09-26: 0.25 mg via INTRAVENOUS

## 2016-09-26 MED ORDER — DEXAMETHASONE SODIUM PHOSPHATE 10 MG/ML IJ SOLN
INTRAMUSCULAR | Status: AC
  Filled 2016-09-26: qty 1

## 2016-09-26 MED ORDER — SODIUM CHLORIDE 0.9 % IV SOLN
386.0000 mg | Freq: Once | INTRAVENOUS | Status: AC
  Administered 2016-09-26: 390 mg via INTRAVENOUS
  Filled 2016-09-26: qty 39

## 2016-09-26 MED ORDER — SODIUM CHLORIDE 0.9 % IV SOLN
Freq: Once | INTRAVENOUS | Status: AC
  Administered 2016-09-26: 11:00:00 via INTRAVENOUS

## 2016-09-26 NOTE — Patient Instructions (Signed)
[facility] Discharge Instructions for Patients Receiving Chemotherapy  Today you received the following chemotherapy agents Etoposide (VP 16), Carboplatin To help prevent nausea and vomiting after your treatment, we encourage you to take your nausea medication as prescribed.   If you develop nausea and vomiting that is not controlled by your nausea medication, call the clinic.   BELOW ARE SYMPTOMS THAT SHOULD BE REPORTED IMMEDIATELY:  *FEVER GREATER THAN 100.5 F  *CHILLS WITH OR WITHOUT FEVER  NAUSEA AND VOMITING THAT IS NOT CONTROLLED WITH YOUR NAUSEA MEDICATION  *UNUSUAL SHORTNESS OF BREATH  *UNUSUAL BRUISING OR BLEEDING  TENDERNESS IN MOUTH AND THROAT WITH OR WITHOUT PRESENCE OF ULCERS  *URINARY PROBLEMS  *BOWEL PROBLEMS  UNUSUAL RASH Items with * indicate a potential emergency and should be followed up as soon as possible.  Feel free to call the clinic you have any questions or concerns. The clinic phone number is [phone].  Please show the [place] at check-in to the Emergency Department and triage nurse.

## 2016-09-26 NOTE — Telephone Encounter (Signed)
Scheduled appt per 6/13 LOS - patient to get schedule in treatment area - per RN diane to tell Patients Nurse to prin t out schedule - patient called to treatment area before schedule was finished.

## 2016-09-26 NOTE — Progress Notes (Signed)
[facility] OFFICE PROGRESS NOTE   Diagnosis: Small cell lung cancer  INTERVAL HISTORY:   [name] completed another cycle of etoposide/carboplatin on 09/05/2016. She received Neulasta 09/08/2016. She reports pain and nausea after receiving Neulasta. This lasted for at least 5 days. Her overall performance status has improved. She has discomfort at the right anterior iliac. The pain is present at night. The pain is not worse with weightbearing.  Objective:  Vital signs in last 24 hours:  Blood pressure 107/86, pulse 100, temperature 98.6 F (37 C), temperature source Oral, resp. rate 18, height 5\' 9"  (1.753 m), weight 125 lb 6.4 oz (56.9 kg), SpO2 97 %.    HEENT: No thrush or ulcers Resp: Distant breath sounds, no respiratory distress, good air movement bilaterally Cardio: Irregular GI: No hepatosplenomegaly, nontender Vascular: No leg edema Musculoskeletal: Mild tenderness at the right anterior iliac, no pain with motion at the right hip     Lab Results:  Lab Results  Component Value Date   WBC 18.8 (H) 09/26/2016   HGB 12.8 09/26/2016   HCT 40.1 09/26/2016   MCV 93.9 09/26/2016   PLT 178 09/26/2016   NEUTROABS 9.1 (H) 09/26/2016    CMP     Component Value Date/Time   NA 142 09/26/2016 0813   K 4.3 09/26/2016 0813   CL 101 07/27/2016 0559   CO2 24 09/26/2016 0813   GLUCOSE 84 09/26/2016 0813   BUN 22.9 09/26/2016 0813   CREATININE 0.7 09/26/2016 0813   CALCIUM 9.2 09/26/2016 0813   PROT 5.9 (L) 09/26/2016 0813   ALBUMIN 3.8 09/26/2016 0813   AST 15 09/26/2016 0813   ALT 13 09/26/2016 0813   ALKPHOS 152 (H) 09/26/2016 0813   BILITOT 0.26 09/26/2016 0813   GFRNONAA >60 07/27/2016 0559   GFRAA >60 07/27/2016 0559     Imaging:  Ct Chest W Contrast  Result Date: 09/24/2016 CLINICAL DATA:  66 year old female with history of small cell lung cancer diagnosed in March 2018. Followup examination. EXAM: CT CHEST WITH CONTRAST TECHNIQUE:  Multidetector CT imaging of the chest was performed during intravenous contrast administration. CONTRAST:  96mL ISOVUE-300 IOPAMIDOL (ISOVUE-300) INJECTION 61% COMPARISON:  Chest CT 07/15/2016. FINDINGS: Cardiovascular: Heart size is normal. There is no significant pericardial fluid, thickening or pericardial calcification. There is aortic atherosclerosis, as well as atherosclerosis of the great vessels of the mediastinum and the coronary arteries, including calcified atherosclerotic plaque in the left main and left anterior descending coronary arteries. Mediastinum/Nodes: There continues to be amorphous nodal tissue in the left hilar and mediastinal nodal stations, most evident in the low left paratracheal nodal station measuring 1.3 cm in short axis, significantly decreased in size compared to prior study. No axillary lymphadenopathy. Lungs/Pleura: Previously noted perihilar left upper lobe mass like opacity has resolved. Interval re-expansion of the left upper lobe. The continues to be some narrowing of the left upper lobe bronchi proximally, however, this is markedly improved compared to the prior study. There are nodular areas of pleuroparenchymal thickening and architectural distortion in the lung apices bilaterally, most likely to reflect chronic areas of post infectious/inflammatory scarring. Small amount of scarring in the basal segments of the right lower lobe also noted. A few scattered tiny pulmonary nodules are noted, most evident in the left lower lobe, where the largest of these measures 4 mm (axial image 102 of series 3). No larger definite new suspicious appearing pulmonary nodule or mass. No acute consolidative airspace disease. No pleural effusions. Mild diffuse  bronchial wall thickening with mild centrilobular and paraseptal emphysema. Upper Abdomen: Large right adrenal nodule measuring 2.4 x 1.6 cm is indeterminate on today's contrast enhanced study, but similar to prior examinations, likely an  adenoma. Aortic atherosclerosis. Musculoskeletal: Numerous sclerotic lesions noted throughout the visualized axial and appendicular skeleton, likely there represents multifocal treated metastases. The largest of these is in the right side of the T7 vertebral body measuring 2.6 x 1.9 cm (axial image 77 of series 2). IMPRESSION: 1. Today's study demonstrates a positive response to therapy with regression of the previously noted perihilar mass in the left upper lobe, resolution of previously noted left upper lobe atelectasis, and regression of previously noted left hilar and mediastinal lymphadenopathy, as detailed above. 2. Interval development of numerous sclerotic osseous lesions throughout the visualized axial and appendicular skeleton, which likely reflect multifocal treated osseous metastases. Attention on followup studies is recommended to ensure the stability these findings. 3. Multiple small pulmonary nodules measuring 4 mm or less in size. These are nonspecific. Attention on followup studies is recommended. 4. Aortic atherosclerosis, in addition to left main and left anterior descending coronary artery disease. Please note that although the presence of coronary artery calcium documents the presence of coronary artery disease, the severity of this disease and any potential stenosis cannot be assessed on this non-gated CT examination. Assessment for potential risk factor modification, dietary therapy or pharmacologic therapy may be warranted, if clinically indicated. 5. Large right adrenal nodule, similar to prior studies, likely an adenoma. 6. Mild diffuse bronchial wall thickening with mild centrilobular and paraseptal emphysema; imaging findings suggestive of underlying COPD. Aortic Atherosclerosis (ICD10-I70.0) and Emphysema (ICD10-J43.9). Electronically Signed   By: Vinnie Langton M.D.   On: 09/24/2016 09:41    Medications: I have reviewed the patient's current medications.  Assessment/Plan: 1.  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 COPD 3. 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 Atrial fibrillation.Cardizem discontinued due to a skin rash. Follow-up with cardiology. 5. Leukocytosis secondary to CLL and steroids 6. Rash 07/25/2016-appearedto have a drug rash, could be related to antibiotics given earlier duringhospital admission, or contrast for the brain MRI; recurrent rash 08/03/2016--cardizem discontinued. Rash resolved 08/15/2016. 7. Oral candidiasis-Diflucan added 07/25/2016, resolved    Disposition:  Sheri Ayala has an improved performance status following 3 cycles of etoposide/carboplatin. I reviewed the CT images with Sheri Ayala and her family. There has been significant improvement in the chest tumor burden. I suspect the sclerotic bone lesions were present prior to treatment and are now more visible. We decided to eliminate Neulasta secondary to nausea and pain she has experienced after Neulasta injections. She has not developed significant neutropenia.  She will return for a nadir CBC after this cycle.  Plans to proceed with cycle 4 etoposide/carboplatin today.  She will return for an office visit and chemotherapy on 10/18/2016.  25 minutes were spent with the patient  today. The majority of the time was used for counseling and coordination of care.  I recommended she  follow-up with her primary physician or cardiology discuss the indication for anticoagulation therapy in the setting of persistent atrial fibrillation. Donneta Romberg, MD  09/26/2016  10:28 AM

## 2016-09-26 NOTE — Progress Notes (Signed)
Patient states she had her PICC  Removed in [place] due to inability to maintain maintenance. Patient sent back to lab for peripheral blood draw. Patient verbalizes understanding that she no longer needs flush appointments and states that she plans to have them removed from her schedule.   Wylene Simmer, BSN, RN 09/26/2016 8:39 AM

## 2016-09-27 ENCOUNTER — Ambulatory Visit (HOSPITAL_BASED_OUTPATIENT_CLINIC_OR_DEPARTMENT_OTHER): Payer: Medicare HMO

## 2016-09-27 VITALS — BP 118/73 | HR 99 | Temp 98.4°F | Resp 18

## 2016-09-27 DIAGNOSIS — Z5111 Encounter for antineoplastic chemotherapy: Secondary | ICD-10-CM | POA: Diagnosis not present

## 2016-09-27 DIAGNOSIS — C3412 Malignant neoplasm of upper lobe, left bronchus or lung: Secondary | ICD-10-CM

## 2016-09-27 DIAGNOSIS — C3492 Malignant neoplasm of unspecified part of left bronchus or lung: Secondary | ICD-10-CM

## 2016-09-27 MED ORDER — DEXAMETHASONE SODIUM PHOSPHATE 10 MG/ML IJ SOLN
10.0000 mg | Freq: Once | INTRAMUSCULAR | Status: AC
  Administered 2016-09-27: 10 mg via INTRAVENOUS

## 2016-09-27 MED ORDER — DEXAMETHASONE SODIUM PHOSPHATE 10 MG/ML IJ SOLN
INTRAMUSCULAR | Status: AC
  Filled 2016-09-27: qty 1

## 2016-09-27 MED ORDER — SODIUM CHLORIDE 0.9 % IV SOLN
Freq: Once | INTRAVENOUS | Status: AC
  Administered 2016-09-27: 08:00:00 via INTRAVENOUS

## 2016-09-27 MED ORDER — SODIUM CHLORIDE 0.9 % IV SOLN
80.0000 mg/m2 | Freq: Once | INTRAVENOUS | Status: AC
  Administered 2016-09-27: 140 mg via INTRAVENOUS
  Filled 2016-09-27: qty 7

## 2016-09-27 NOTE — Patient Instructions (Signed)
[facility] Discharge Instructions for Patients Receiving Chemotherapy  Today you received the following chemotherapy agents: Etoposide  To help prevent nausea and vomiting after your treatment, we encourage you to take your nausea medication as directed.    If you develop nausea and vomiting that is not controlled by your nausea medication, call the clinic.   BELOW ARE SYMPTOMS THAT SHOULD BE REPORTED IMMEDIATELY:  *FEVER GREATER THAN 100.5 F  *CHILLS WITH OR WITHOUT FEVER  NAUSEA AND VOMITING THAT IS NOT CONTROLLED WITH YOUR NAUSEA MEDICATION  *UNUSUAL SHORTNESS OF BREATH  *UNUSUAL BRUISING OR BLEEDING  TENDERNESS IN MOUTH AND THROAT WITH OR WITHOUT PRESENCE OF ULCERS  *URINARY PROBLEMS  *BOWEL PROBLEMS  UNUSUAL RASH Items with * indicate a potential emergency and should be followed up as soon as possible.  Feel free to call the clinic you have any questions or concerns. The clinic phone number is [phone].  Please show the [place] at check-in to the Emergency Department and triage nurse.

## 2016-09-28 ENCOUNTER — Ambulatory Visit (HOSPITAL_BASED_OUTPATIENT_CLINIC_OR_DEPARTMENT_OTHER): Payer: Medicare HMO

## 2016-09-28 VITALS — BP 117/73 | HR 91 | Temp 98.1°F | Resp 19

## 2016-09-28 DIAGNOSIS — C3492 Malignant neoplasm of unspecified part of left bronchus or lung: Secondary | ICD-10-CM

## 2016-09-28 DIAGNOSIS — Z5111 Encounter for antineoplastic chemotherapy: Secondary | ICD-10-CM | POA: Diagnosis not present

## 2016-09-28 MED ORDER — SODIUM CHLORIDE 0.9 % IV SOLN
80.0000 mg/m2 | Freq: Once | INTRAVENOUS | Status: AC
  Administered 2016-09-28: 140 mg via INTRAVENOUS
  Filled 2016-09-28: qty 7

## 2016-09-28 MED ORDER — DEXAMETHASONE SODIUM PHOSPHATE 10 MG/ML IJ SOLN
INTRAMUSCULAR | Status: AC
  Filled 2016-09-28: qty 1

## 2016-09-28 MED ORDER — DEXAMETHASONE SODIUM PHOSPHATE 10 MG/ML IJ SOLN
10.0000 mg | Freq: Once | INTRAMUSCULAR | Status: AC
  Administered 2016-09-28: 10 mg via INTRAVENOUS

## 2016-09-28 MED ORDER — SODIUM CHLORIDE 0.9 % IV SOLN
Freq: Once | INTRAVENOUS | Status: AC
Start: 2016-09-28 — End: 2016-09-28
  Administered 2016-09-28: 08:00:00 via INTRAVENOUS

## 2016-09-28 NOTE — Patient Instructions (Signed)
[facility] Discharge Instructions for Patients Receiving Chemotherapy  Today you received the following chemotherapy agents: Etoposide  To help prevent nausea and vomiting after your treatment, we encourage you to take your nausea medication as directed.    If you develop nausea and vomiting that is not controlled by your nausea medication, call the clinic.   BELOW ARE SYMPTOMS THAT SHOULD BE REPORTED IMMEDIATELY:  *FEVER GREATER THAN 100.5 F  *CHILLS WITH OR WITHOUT FEVER  NAUSEA AND VOMITING THAT IS NOT CONTROLLED WITH YOUR NAUSEA MEDICATION  *UNUSUAL SHORTNESS OF BREATH  *UNUSUAL BRUISING OR BLEEDING  TENDERNESS IN MOUTH AND THROAT WITH OR WITHOUT PRESENCE OF ULCERS  *URINARY PROBLEMS  *BOWEL PROBLEMS  UNUSUAL RASH Items with * indicate a potential emergency and should be followed up as soon as possible.  Feel free to call the clinic you have any questions or concerns. The clinic phone number is [phone].  Please show the [place] at check-in to the Emergency Department and triage nurse.

## 2016-09-29 ENCOUNTER — Ambulatory Visit: Payer: Medicare HMO

## 2016-10-08 ENCOUNTER — Telehealth: Payer: Self-pay

## 2016-10-08 MED ORDER — PROCHLORPERAZINE MALEATE 10 MG PO TABS: 5.0000 mg | ORAL_TABLET | Freq: Four times a day (QID) | ORAL | 1 refills | Status: DC | PRN

## 2016-10-08 NOTE — Telephone Encounter (Signed)
Pt moved and lost compazine in the mess. She is asking for refill. The time frame is adequate for refill. rx sent.

## 2016-10-09 ENCOUNTER — Other Ambulatory Visit (HOSPITAL_BASED_OUTPATIENT_CLINIC_OR_DEPARTMENT_OTHER): Payer: Medicare HMO

## 2016-10-09 DIAGNOSIS — C3412 Malignant neoplasm of upper lobe, left bronchus or lung: Secondary | ICD-10-CM | POA: Diagnosis not present

## 2016-10-09 DIAGNOSIS — C3492 Malignant neoplasm of unspecified part of left bronchus or lung: Secondary | ICD-10-CM

## 2016-10-09 LAB — CBC WITH DIFFERENTIAL/PLATELET
BASO%: 0.4 % (ref 0.0–2.0)
Basophils Absolute: 0 10*3/uL (ref 0.0–0.1)
EOS%: 0.5 % (ref 0.0–7.0)
Eosinophils Absolute: 0 10*3/uL (ref 0.0–0.5)
HEMATOCRIT: 35.3 % (ref 34.8–46.6)
HEMOGLOBIN: 11.5 g/dL — AB (ref 11.6–15.9)
LYMPH#: 5.5 10*3/uL — AB (ref 0.9–3.3)
LYMPH%: 73.3 % — ABNORMAL HIGH (ref 14.0–49.7)
MCH: 30.4 pg (ref 25.1–34.0)
MCHC: 32.5 g/dL (ref 31.5–36.0)
MCV: 93.4 fL (ref 79.5–101.0)
MONO#: 0.2 10*3/uL (ref 0.1–0.9)
MONO%: 2.5 % (ref 0.0–14.0)
NEUT#: 1.8 10*3/uL (ref 1.5–6.5)
NEUT%: 23.3 % — AB (ref 38.4–76.8)
PLATELETS: 54 10*3/uL — AB (ref 145–400)
RBC: 3.78 10*6/uL (ref 3.70–5.45)
RDW: 20.7 % — AB (ref 11.2–14.5)
WBC: 7.5 10*3/uL (ref 3.9–10.3)

## 2016-10-09 LAB — TECHNOLOGIST REVIEW

## 2016-10-10 ENCOUNTER — Telehealth: Payer: Self-pay | Admitting: *Deleted

## 2016-10-10 NOTE — Telephone Encounter (Signed)
-----   Message from Ladell Pier, MD sent at 10/09/2016  7:58 PM EDT ----- Please call patient, platelets are low, call for bleeding, repeat cbc 6/29

## 2016-10-11 NOTE — Telephone Encounter (Signed)
Spoke with pt, thrombocytopenic precautions reviewed. She denied any spontaneous bleeding. Appt given for lab 6/29.

## 2016-10-12 ENCOUNTER — Telehealth: Payer: Self-pay | Admitting: *Deleted

## 2016-10-12 ENCOUNTER — Ambulatory Visit (HOSPITAL_BASED_OUTPATIENT_CLINIC_OR_DEPARTMENT_OTHER): Payer: Medicare HMO

## 2016-10-12 ENCOUNTER — Other Ambulatory Visit: Payer: Self-pay | Admitting: *Deleted

## 2016-10-12 DIAGNOSIS — C3412 Malignant neoplasm of upper lobe, left bronchus or lung: Secondary | ICD-10-CM | POA: Diagnosis not present

## 2016-10-12 DIAGNOSIS — C9110 Chronic lymphocytic leukemia of B-cell type not having achieved remission: Secondary | ICD-10-CM | POA: Diagnosis not present

## 2016-10-12 DIAGNOSIS — C3492 Malignant neoplasm of unspecified part of left bronchus or lung: Secondary | ICD-10-CM

## 2016-10-12 LAB — CBC WITH DIFFERENTIAL/PLATELET
BASO%: 0.5 % (ref 0.0–2.0)
BASOS ABS: 0 10*3/uL (ref 0.0–0.1)
EOS%: 1.5 % (ref 0.0–7.0)
Eosinophils Absolute: 0.1 10*3/uL (ref 0.0–0.5)
HCT: 36.6 % (ref 34.8–46.6)
HGB: 12 g/dL (ref 11.6–15.9)
LYMPH#: 4.9 10*3/uL — AB (ref 0.9–3.3)
LYMPH%: 72.7 % — AB (ref 14.0–49.7)
MCH: 30.7 pg (ref 25.1–34.0)
MCHC: 32.8 g/dL (ref 31.5–36.0)
MCV: 93.5 fL (ref 79.5–101.0)
MONO#: 0.2 10*3/uL (ref 0.1–0.9)
MONO%: 3.2 % (ref 0.0–14.0)
NEUT#: 1.5 10*3/uL (ref 1.5–6.5)
NEUT%: 22.1 % — AB (ref 38.4–76.8)
PLATELETS: 66 10*3/uL — AB (ref 145–400)
RBC: 3.91 10*6/uL (ref 3.70–5.45)
RDW: 20.8 % — ABNORMAL HIGH (ref 11.2–14.5)
WBC: 6.8 10*3/uL (ref 3.9–10.3)

## 2016-10-12 LAB — TECHNOLOGIST REVIEW

## 2016-10-12 NOTE — Telephone Encounter (Signed)
CBC reviewed by Dr. Benay Spice: PLT count is better, call for bleeding or spontaneous bruising. Follow up as scheduled.

## 2016-10-24 ENCOUNTER — Ambulatory Visit (HOSPITAL_BASED_OUTPATIENT_CLINIC_OR_DEPARTMENT_OTHER): Payer: Medicare HMO

## 2016-10-24 ENCOUNTER — Ambulatory Visit (HOSPITAL_BASED_OUTPATIENT_CLINIC_OR_DEPARTMENT_OTHER): Payer: Medicare HMO | Admitting: Oncology

## 2016-10-24 ENCOUNTER — Ambulatory Visit: Payer: Medicare HMO

## 2016-10-24 ENCOUNTER — Ambulatory Visit: Payer: Medicare HMO | Admitting: Oncology

## 2016-10-24 ENCOUNTER — Other Ambulatory Visit: Payer: Self-pay

## 2016-10-24 ENCOUNTER — Other Ambulatory Visit: Payer: Medicare HMO

## 2016-10-24 VITALS — HR 99

## 2016-10-24 VITALS — BP 139/59 | HR 101 | Temp 98.2°F | Resp 18 | Ht 69.0 in | Wt 121.4 lb

## 2016-10-24 DIAGNOSIS — C3492 Malignant neoplasm of unspecified part of left bronchus or lung: Secondary | ICD-10-CM | POA: Diagnosis not present

## 2016-10-24 DIAGNOSIS — I4891 Unspecified atrial fibrillation: Secondary | ICD-10-CM

## 2016-10-24 DIAGNOSIS — C9110 Chronic lymphocytic leukemia of B-cell type not having achieved remission: Secondary | ICD-10-CM

## 2016-10-24 DIAGNOSIS — Z5111 Encounter for antineoplastic chemotherapy: Secondary | ICD-10-CM | POA: Diagnosis not present

## 2016-10-24 DIAGNOSIS — D72828 Other elevated white blood cell count: Secondary | ICD-10-CM

## 2016-10-24 LAB — CBC WITH DIFFERENTIAL/PLATELET
BASO%: 0.2 % (ref 0.0–2.0)
Basophils Absolute: 0 10*3/uL (ref 0.0–0.1)
EOS%: 0.6 % (ref 0.0–7.0)
Eosinophils Absolute: 0.1 10*3/uL (ref 0.0–0.5)
HCT: 43.8 % (ref 34.8–46.6)
HGB: 14.3 g/dL (ref 11.6–15.9)
LYMPH%: 67.5 % — AB (ref 14.0–49.7)
MCH: 31.2 pg (ref 25.1–34.0)
MCHC: 32.6 g/dL (ref 31.5–36.0)
MCV: 95.6 fL (ref 79.5–101.0)
MONO#: 0.5 10*3/uL (ref 0.1–0.9)
MONO%: 3.4 % (ref 0.0–14.0)
NEUT#: 4 10*3/uL (ref 1.5–6.5)
NEUT%: 28.3 % — ABNORMAL LOW (ref 38.4–76.8)
Platelets: 146 10*3/uL (ref 145–400)
RBC: 4.58 10*6/uL (ref 3.70–5.45)
RDW: 17.1 % — ABNORMAL HIGH (ref 11.2–14.5)
WBC: 14 10*3/uL — AB (ref 3.9–10.3)
lymph#: 9.5 10*3/uL — ABNORMAL HIGH (ref 0.9–3.3)
nRBC: 0 % (ref 0–0)

## 2016-10-24 LAB — COMPREHENSIVE METABOLIC PANEL
ALT: 9 U/L (ref 0–55)
ANION GAP: 12 meq/L — AB (ref 3–11)
AST: 13 U/L (ref 5–34)
Albumin: 4.2 g/dL (ref 3.5–5.0)
Alkaline Phosphatase: 178 U/L — ABNORMAL HIGH (ref 40–150)
BUN: 15.2 mg/dL (ref 7.0–26.0)
CHLORIDE: 107 meq/L (ref 98–109)
CO2: 22 meq/L (ref 22–29)
CREATININE: 0.8 mg/dL (ref 0.6–1.1)
Calcium: 9.2 mg/dL (ref 8.4–10.4)
EGFR: 79 mL/min/{1.73_m2} — ABNORMAL LOW (ref >90)
Glucose: 90 mg/dl (ref 70–140)
POTASSIUM: 3.9 meq/L (ref 3.5–5.1)
Sodium: 141 mEq/L (ref 136–145)
Total Bilirubin: 0.4 mg/dL (ref 0.20–1.20)
Total Protein: 6.3 g/dL — ABNORMAL LOW (ref 6.4–8.3)

## 2016-10-24 LAB — TECHNOLOGIST REVIEW

## 2016-10-24 MED ORDER — PALONOSETRON HCL INJECTION 0.25 MG/5ML
INTRAVENOUS | Status: AC
  Filled 2016-10-24: qty 5

## 2016-10-24 MED ORDER — ETOPOSIDE CHEMO INJECTION 1 GM/50ML
80.0000 mg/m2 | Freq: Once | INTRAVENOUS | Status: AC
  Administered 2016-10-24: 130 mg via INTRAVENOUS
  Filled 2016-10-24: qty 6.5

## 2016-10-24 MED ORDER — DEXAMETHASONE SODIUM PHOSPHATE 10 MG/ML IJ SOLN
INTRAMUSCULAR | Status: AC
  Filled 2016-10-24: qty 1

## 2016-10-24 MED ORDER — SODIUM CHLORIDE 0.9 % IV SOLN
362.5000 mg | Freq: Once | INTRAVENOUS | Status: AC
  Administered 2016-10-24: 360 mg via INTRAVENOUS
  Filled 2016-10-24: qty 36

## 2016-10-24 MED ORDER — SODIUM CHLORIDE 0.9 % IV SOLN
Freq: Once | INTRAVENOUS | Status: AC
  Administered 2016-10-24: 11:00:00 via INTRAVENOUS

## 2016-10-24 MED ORDER — HYDROCODONE-ACETAMINOPHEN 5-325 MG PO TABS: 1.0000 | ORAL_TABLET | Freq: Four times a day (QID) | ORAL | 0 refills | Status: DC | PRN

## 2016-10-24 MED ORDER — DEXAMETHASONE SODIUM PHOSPHATE 10 MG/ML IJ SOLN
10.0000 mg | Freq: Once | INTRAMUSCULAR | Status: AC
Start: 2016-10-24 — End: 2016-10-24
  Administered 2016-10-24: 10 mg via INTRAVENOUS

## 2016-10-24 MED ORDER — PALONOSETRON HCL INJECTION 0.25 MG/5ML
0.2500 mg | Freq: Once | INTRAVENOUS | Status: AC
  Administered 2016-10-24: 0.25 mg via INTRAVENOUS

## 2016-10-24 NOTE — Patient Instructions (Signed)
[facility] Discharge Instructions for Patients Receiving Chemotherapy  Today you received the following chemotherapy agents: Carboplatin and Etoposide   To help prevent nausea and vomiting after your treatment, we encourage you to take your nausea medication as directed.    If you develop nausea and vomiting that is not controlled by your nausea medication, call the clinic.   BELOW ARE SYMPTOMS THAT SHOULD BE REPORTED IMMEDIATELY:  *FEVER GREATER THAN 100.5 F  *CHILLS WITH OR WITHOUT FEVER  NAUSEA AND VOMITING THAT IS NOT CONTROLLED WITH YOUR NAUSEA MEDICATION  *UNUSUAL SHORTNESS OF BREATH  *UNUSUAL BRUISING OR BLEEDING  TENDERNESS IN MOUTH AND THROAT WITH OR WITHOUT PRESENCE OF ULCERS  *URINARY PROBLEMS  *BOWEL PROBLEMS  UNUSUAL RASH Items with * indicate a potential emergency and should be followed up as soon as possible.  Feel free to call the clinic you have any questions or concerns. The clinic phone number is [phone].  Please show the [place] at check-in to the Emergency Department and triage nurse.

## 2016-10-24 NOTE — Progress Notes (Signed)
[facility] OFFICE PROGRESS NOTE   Diagnosis: Small cell lung cancer  INTERVAL HISTORY:   Ms. Goonan completed another cycle etoposide/carboplatin beginning 09/26/2016. She tolerated chemotherapy well. No nausea/vomiting. She has noted improvement in dyspnea. She is more active. Good appetite. She has intermittent discomfort at the right scapula and at the right anterior iliac region. She takes hydrocodone as needed.  Objective:  Vital signs in last 24 hours:  Blood pressure (!) 139/59, pulse (!) 101, temperature 98.2 F (36.8 C), temperature source Oral, resp. rate 18, height 5\' 9"  (1.753 m), weight 121 lb 6.4 oz (55.1 kg), SpO2 100 %.    HEENT: No thrush or ulcers Resp: Distant breath sounds, no respiratory distress Cardio: Irregular, tachycardia GI: No hepatomegaly, no mass, nontender Vascular: No leg edema Musculoskeletal: No tenderness or mass at the right anterior iliac, mild tenderness at the low abdomen superior to the iliac. No mass.     Lab Results:  Lab Results  Component Value Date   WBC 14.0 (H) 10/24/2016   HGB 14.3 10/24/2016   HCT 43.8 10/24/2016   MCV 95.6 10/24/2016   PLT 146 10/24/2016   NEUTROABS 4.0 10/24/2016    CMP     Component Value Date/Time   NA 141 10/24/2016 0844   K 3.9 10/24/2016 0844   CL 101 07/27/2016 0559   CO2 22 10/24/2016 0844   GLUCOSE 90 10/24/2016 0844   BUN 15.2 10/24/2016 0844   CREATININE 0.8 10/24/2016 0844   CALCIUM 9.2 10/24/2016 0844   PROT 6.3 (L) 10/24/2016 0844   ALBUMIN 4.2 10/24/2016 0844   AST 13 10/24/2016 0844   ALT 9 10/24/2016 0844   ALKPHOS 178 (H) 10/24/2016 0844   BILITOT 0.40 10/24/2016 0844   GFRNONAA >60 07/27/2016 0559   GFRAA >60 07/27/2016 0559    Medications: I have reviewed the patient's current medications.  Assessment/Plan: 1. 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 COPD 3. 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 Atrial fibrillation.Cardizem discontinued due to a skin rash. Follow-up with cardiology. 5. Leukocytosis secondary to CLL and steroids 6. Rash 07/25/2016-appearedto have a drug rash, could be related to antibiotics given earlier duringhospital admission, or contrast for the brain MRI; recurrent rash 08/03/2016--cardizem discontinued. Rash resolved 08/15/2016. 7. Oral candidiasis-Diflucan added 07/25/2016, resolved    Disposition:  Ms. Odwyer appears well. She is tolerating the chemotherapy well. The plan is to proceed with cycle 5 etoposide/carboplatin today. The etiology of the right iliac discomfort is unclear. At this becomes consistent we will image this area.  She will be treated without Neulasta support today. She will return for a nadir CBC on 11/05/2016.  She plans to see her cardiologist within the next few weeks. I recommended she discuss anticoagulation for atrial fibrillation with her cardiologist.  She will return for an office visit and the final planned cycle of chemotherapy on 11/21/2016.  25 minutes were spent with the patient today. The majority of the time  was used for counseling and coordination of care.  Donneta Romberg, MD  10/24/2016  9:46 AM

## 2016-10-25 ENCOUNTER — Ambulatory Visit: Payer: Medicare HMO

## 2016-10-25 ENCOUNTER — Ambulatory Visit (HOSPITAL_BASED_OUTPATIENT_CLINIC_OR_DEPARTMENT_OTHER): Payer: Medicare HMO

## 2016-10-25 VITALS — BP 109/47 | HR 99 | Temp 98.9°F | Resp 16

## 2016-10-25 DIAGNOSIS — Z5111 Encounter for antineoplastic chemotherapy: Secondary | ICD-10-CM | POA: Diagnosis not present

## 2016-10-25 DIAGNOSIS — C3492 Malignant neoplasm of unspecified part of left bronchus or lung: Secondary | ICD-10-CM

## 2016-10-25 MED ORDER — DEXAMETHASONE SODIUM PHOSPHATE 10 MG/ML IJ SOLN
INTRAMUSCULAR | Status: AC
  Filled 2016-10-25: qty 1

## 2016-10-25 MED ORDER — DEXAMETHASONE SODIUM PHOSPHATE 10 MG/ML IJ SOLN
10.0000 mg | Freq: Once | INTRAMUSCULAR | Status: AC
  Administered 2016-10-25: 10 mg via INTRAVENOUS

## 2016-10-25 MED ORDER — SODIUM CHLORIDE 0.9 % IV SOLN
Freq: Once | INTRAVENOUS | Status: AC
  Administered 2016-10-25: 09:00:00 via INTRAVENOUS

## 2016-10-25 MED ORDER — SODIUM CHLORIDE 0.9 % IV SOLN
80.0000 mg/m2 | Freq: Once | INTRAVENOUS | Status: AC
  Administered 2016-10-25: 130 mg via INTRAVENOUS
  Filled 2016-10-25: qty 6.5

## 2016-10-25 NOTE — Patient Instructions (Signed)
[facility] Discharge Instructions for Patients Receiving Chemotherapy  Today you received the following chemotherapy agents:  Gemzar and Carboplatin.  To help prevent nausea and vomiting after your treatment, we encourage you to take your nausea medication as directed.   If you develop nausea and vomiting that is not controlled by your nausea medication, call the clinic.   BELOW ARE SYMPTOMS THAT SHOULD BE REPORTED IMMEDIATELY:  *FEVER GREATER THAN 100.5 F  *CHILLS WITH OR WITHOUT FEVER  NAUSEA AND VOMITING THAT IS NOT CONTROLLED WITH YOUR NAUSEA MEDICATION  *UNUSUAL SHORTNESS OF BREATH  *UNUSUAL BRUISING OR BLEEDING  TENDERNESS IN MOUTH AND THROAT WITH OR WITHOUT PRESENCE OF ULCERS  *URINARY PROBLEMS  *BOWEL PROBLEMS  UNUSUAL RASH Items with * indicate a potential emergency and should be followed up as soon as possible.  Feel free to call the clinic you have any questions or concerns. The clinic phone number is [phone].  Please show the [facility] at check-in to the Emergency Department and triage nurse.

## 2016-10-26 ENCOUNTER — Ambulatory Visit: Payer: Medicare HMO

## 2016-10-26 ENCOUNTER — Ambulatory Visit (HOSPITAL_BASED_OUTPATIENT_CLINIC_OR_DEPARTMENT_OTHER): Payer: Medicare HMO

## 2016-10-26 VITALS — BP 115/53 | HR 89 | Temp 98.2°F | Resp 16

## 2016-10-26 DIAGNOSIS — Z5111 Encounter for antineoplastic chemotherapy: Secondary | ICD-10-CM | POA: Diagnosis not present

## 2016-10-26 DIAGNOSIS — C3492 Malignant neoplasm of unspecified part of left bronchus or lung: Secondary | ICD-10-CM | POA: Diagnosis not present

## 2016-10-26 MED ORDER — DEXAMETHASONE SODIUM PHOSPHATE 10 MG/ML IJ SOLN
INTRAMUSCULAR | Status: AC
  Filled 2016-10-26: qty 1

## 2016-10-26 MED ORDER — SODIUM CHLORIDE 0.9 % IV SOLN
Freq: Once | INTRAVENOUS | Status: AC
  Administered 2016-10-26: 08:00:00 via INTRAVENOUS

## 2016-10-26 MED ORDER — DEXAMETHASONE SODIUM PHOSPHATE 10 MG/ML IJ SOLN
10.0000 mg | Freq: Once | INTRAMUSCULAR | Status: AC
  Administered 2016-10-26: 10 mg via INTRAVENOUS

## 2016-10-26 MED ORDER — ETOPOSIDE CHEMO INJECTION 1 GM/50ML
80.0000 mg/m2 | Freq: Once | INTRAVENOUS | Status: AC
  Administered 2016-10-26: 130 mg via INTRAVENOUS
  Filled 2016-10-26: qty 6.5

## 2016-10-26 NOTE — Patient Instructions (Signed)
[facility] Discharge Instructions for Patients Receiving Chemotherapy  Today you received the following chemotherapy agents:  Etoposide.  To help prevent nausea and vomiting after your treatment, we encourage you to take your nausea medication as directed.   If you develop nausea and vomiting that is not controlled by your nausea medication, call the clinic.   BELOW ARE SYMPTOMS THAT SHOULD BE REPORTED IMMEDIATELY:  *FEVER GREATER THAN 100.5 F  *CHILLS WITH OR WITHOUT FEVER  NAUSEA AND VOMITING THAT IS NOT CONTROLLED WITH YOUR NAUSEA MEDICATION  *UNUSUAL SHORTNESS OF BREATH  *UNUSUAL BRUISING OR BLEEDING  TENDERNESS IN MOUTH AND THROAT WITH OR WITHOUT PRESENCE OF ULCERS  *URINARY PROBLEMS  *BOWEL PROBLEMS  UNUSUAL RASH Items with * indicate a potential emergency and should be followed up as soon as possible.  Feel free to call the clinic you have any questions or concerns. The clinic phone number is [phone].  Please show the [place] at check-in to the Emergency Department and triage nurse.

## 2016-11-05 ENCOUNTER — Other Ambulatory Visit (HOSPITAL_BASED_OUTPATIENT_CLINIC_OR_DEPARTMENT_OTHER): Payer: Medicare HMO

## 2016-11-05 DIAGNOSIS — C3492 Malignant neoplasm of unspecified part of left bronchus or lung: Secondary | ICD-10-CM | POA: Diagnosis not present

## 2016-11-05 DIAGNOSIS — C9110 Chronic lymphocytic leukemia of B-cell type not having achieved remission: Secondary | ICD-10-CM

## 2016-11-05 LAB — TECHNOLOGIST REVIEW

## 2016-11-05 LAB — CBC WITH DIFFERENTIAL/PLATELET
BASO%: 0.3 % (ref 0.0–2.0)
BASOS ABS: 0 10*3/uL (ref 0.0–0.1)
EOS ABS: 0 10*3/uL (ref 0.0–0.5)
EOS%: 0.2 % (ref 0.0–7.0)
HCT: 39.5 % (ref 34.8–46.6)
HGB: 12.9 g/dL (ref 11.6–15.9)
LYMPH%: 74.2 % — AB (ref 14.0–49.7)
MCH: 31.7 pg (ref 25.1–34.0)
MCHC: 32.7 g/dL (ref 31.5–36.0)
MCV: 96.7 fL (ref 79.5–101.0)
MONO#: 0.1 10*3/uL (ref 0.1–0.9)
MONO%: 1.4 % (ref 0.0–14.0)
NEUT#: 1.8 10*3/uL (ref 1.5–6.5)
NEUT%: 23.9 % — AB (ref 38.4–76.8)
Platelets: 47 10*3/uL — ABNORMAL LOW (ref 145–400)
RBC: 4.08 10*6/uL (ref 3.70–5.45)
RDW: 15.4 % — ABNORMAL HIGH (ref 11.2–14.5)
WBC: 7.5 10*3/uL (ref 3.9–10.3)
lymph#: 5.6 10*3/uL — ABNORMAL HIGH (ref 0.9–3.3)

## 2016-11-06 ENCOUNTER — Telehealth: Payer: Self-pay | Admitting: *Deleted

## 2016-11-06 NOTE — Telephone Encounter (Signed)
-----   Message from Ladell Pier, MD sent at 11/05/2016  8:14 PM EDT ----- Please call patient, platelets are low, return for cbc on 7/26, call for bleeding

## 2016-11-06 NOTE — Telephone Encounter (Signed)
Called pt, she was aware of PLT count. Instructed her to call office for bleeding or spontaneous bruising. Appt given for lab 7/26.

## 2016-11-07 ENCOUNTER — Other Ambulatory Visit: Payer: Self-pay | Admitting: Nurse Practitioner

## 2016-11-07 DIAGNOSIS — C3492 Malignant neoplasm of unspecified part of left bronchus or lung: Secondary | ICD-10-CM

## 2016-11-08 ENCOUNTER — Other Ambulatory Visit (HOSPITAL_BASED_OUTPATIENT_CLINIC_OR_DEPARTMENT_OTHER): Payer: Medicare HMO

## 2016-11-08 DIAGNOSIS — C9110 Chronic lymphocytic leukemia of B-cell type not having achieved remission: Secondary | ICD-10-CM

## 2016-11-08 DIAGNOSIS — C3492 Malignant neoplasm of unspecified part of left bronchus or lung: Secondary | ICD-10-CM

## 2016-11-08 LAB — CBC WITH DIFFERENTIAL/PLATELET
BASO%: 0 % (ref 0.0–2.0)
BASOS ABS: 0 10*3/uL (ref 0.0–0.1)
EOS%: 0.3 % (ref 0.0–7.0)
Eosinophils Absolute: 0 10*3/uL (ref 0.0–0.5)
HEMATOCRIT: 41.1 % (ref 34.8–46.6)
HGB: 13.3 g/dL (ref 11.6–15.9)
LYMPH#: 5 10*3/uL — AB (ref 0.9–3.3)
LYMPH%: 75.1 % — ABNORMAL HIGH (ref 14.0–49.7)
MCH: 31.7 pg (ref 25.1–34.0)
MCHC: 32.4 g/dL (ref 31.5–36.0)
MCV: 97.9 fL (ref 79.5–101.0)
MONO#: 0.1 10*3/uL (ref 0.1–0.9)
MONO%: 2 % (ref 0.0–14.0)
NEUT#: 1.5 10*3/uL (ref 1.5–6.5)
NEUT%: 22.6 % — ABNORMAL LOW (ref 38.4–76.8)
PLATELETS: 68 10*3/uL — AB (ref 145–400)
RBC: 4.2 10*6/uL (ref 3.70–5.45)
RDW: 14.9 % — ABNORMAL HIGH (ref 11.2–14.5)
WBC: 6.6 10*3/uL (ref 3.9–10.3)

## 2016-11-18 ENCOUNTER — Other Ambulatory Visit: Payer: Self-pay | Admitting: Oncology

## 2016-11-21 ENCOUNTER — Other Ambulatory Visit (HOSPITAL_BASED_OUTPATIENT_CLINIC_OR_DEPARTMENT_OTHER): Payer: Medicare HMO

## 2016-11-21 ENCOUNTER — Ambulatory Visit (HOSPITAL_BASED_OUTPATIENT_CLINIC_OR_DEPARTMENT_OTHER): Payer: Medicare HMO | Admitting: Nurse Practitioner

## 2016-11-21 ENCOUNTER — Telehealth: Payer: Self-pay | Admitting: Oncology

## 2016-11-21 ENCOUNTER — Ambulatory Visit (HOSPITAL_BASED_OUTPATIENT_CLINIC_OR_DEPARTMENT_OTHER): Payer: Medicare HMO

## 2016-11-21 VITALS — BP 116/49 | HR 98 | Temp 98.8°F | Resp 18 | Ht 69.0 in | Wt 126.8 lb

## 2016-11-21 DIAGNOSIS — C3492 Malignant neoplasm of unspecified part of left bronchus or lung: Secondary | ICD-10-CM | POA: Diagnosis not present

## 2016-11-21 DIAGNOSIS — Z5111 Encounter for antineoplastic chemotherapy: Secondary | ICD-10-CM

## 2016-11-21 DIAGNOSIS — C9110 Chronic lymphocytic leukemia of B-cell type not having achieved remission: Secondary | ICD-10-CM

## 2016-11-21 LAB — CBC WITH DIFFERENTIAL/PLATELET
BASO%: 0.2 % (ref 0.0–2.0)
Basophils Absolute: 0 10*3/uL (ref 0.0–0.1)
EOS%: 0.8 % (ref 0.0–7.0)
Eosinophils Absolute: 0.1 10*3/uL (ref 0.0–0.5)
HCT: 38.8 % (ref 34.8–46.6)
HGB: 12.9 g/dL (ref 11.6–15.9)
LYMPH%: 68.1 % — AB (ref 14.0–49.7)
MCH: 32.7 pg (ref 25.1–34.0)
MCHC: 33.2 g/dL (ref 31.5–36.0)
MCV: 98.2 fL (ref 79.5–101.0)
MONO#: 0.4 10*3/uL (ref 0.1–0.9)
MONO%: 4 % (ref 0.0–14.0)
NEUT%: 26.9 % — AB (ref 38.4–76.8)
NEUTROS ABS: 2.5 10*3/uL (ref 1.5–6.5)
PLATELETS: 106 10*3/uL — AB (ref 145–400)
RBC: 3.95 10*6/uL (ref 3.70–5.45)
RDW: 14.6 % — ABNORMAL HIGH (ref 11.2–14.5)
WBC: 9.1 10*3/uL (ref 3.9–10.3)
lymph#: 6.2 10*3/uL — ABNORMAL HIGH (ref 0.9–3.3)

## 2016-11-21 LAB — COMPREHENSIVE METABOLIC PANEL
ALBUMIN: 3.5 g/dL (ref 3.5–5.0)
ALK PHOS: 137 U/L (ref 40–150)
ALT: 12 U/L (ref 0–55)
AST: 16 U/L (ref 5–34)
Anion Gap: 9 mEq/L (ref 3–11)
BILIRUBIN TOTAL: 0.32 mg/dL (ref 0.20–1.20)
BUN: 16.3 mg/dL (ref 7.0–26.0)
CALCIUM: 9 mg/dL (ref 8.4–10.4)
CO2: 25 mEq/L (ref 22–29)
Chloride: 107 mEq/L (ref 98–109)
Creatinine: 0.7 mg/dL (ref 0.6–1.1)
EGFR: 85 mL/min/{1.73_m2} — ABNORMAL LOW (ref >90)
GLUCOSE: 71 mg/dL (ref 70–140)
POTASSIUM: 4.1 meq/L (ref 3.5–5.1)
SODIUM: 140 meq/L (ref 136–145)
TOTAL PROTEIN: 5.3 g/dL — AB (ref 6.4–8.3)

## 2016-11-21 MED ORDER — SODIUM CHLORIDE 0.9 % IV SOLN
10.0000 mg | Freq: Once | INTRAVENOUS | Status: AC
  Administered 2016-11-21: 10 mg via INTRAVENOUS
  Filled 2016-11-21: qty 1

## 2016-11-21 MED ORDER — SODIUM CHLORIDE 0.9 % IV SOLN
80.0000 mg/m2 | Freq: Once | INTRAVENOUS | Status: AC
  Administered 2016-11-21: 130 mg via INTRAVENOUS
  Filled 2016-11-21: qty 6.5

## 2016-11-21 MED ORDER — HYDROCODONE-ACETAMINOPHEN 5-325 MG PO TABS: 1.0000 | ORAL_TABLET | Freq: Four times a day (QID) | ORAL | 0 refills | Status: DC | PRN

## 2016-11-21 MED ORDER — SODIUM CHLORIDE 0.9 % IV SOLN
250.0000 mg | Freq: Once | INTRAVENOUS | Status: AC
  Administered 2016-11-21: 250 mg via INTRAVENOUS
  Filled 2016-11-21: qty 25

## 2016-11-21 MED ORDER — PALONOSETRON HCL INJECTION 0.25 MG/5ML
INTRAVENOUS | Status: AC
  Filled 2016-11-21: qty 5

## 2016-11-21 MED ORDER — SODIUM CHLORIDE 0.9 % IV SOLN
Freq: Once | INTRAVENOUS | Status: AC
  Administered 2016-11-21: 12:00:00 via INTRAVENOUS

## 2016-11-21 MED ORDER — DEXAMETHASONE SODIUM PHOSPHATE 10 MG/ML IJ SOLN
10.0000 mg | Freq: Once | INTRAMUSCULAR | Status: DC
Start: 2016-11-21 — End: 2016-11-21

## 2016-11-21 MED ORDER — PALONOSETRON HCL INJECTION 0.25 MG/5ML
0.2500 mg | Freq: Once | INTRAVENOUS | Status: AC
  Administered 2016-11-21: 0.25 mg via INTRAVENOUS

## 2016-11-21 NOTE — Telephone Encounter (Signed)
Gave patient avs and updated calendar.

## 2016-11-21 NOTE — Addendum Note (Signed)
Addended by: Betsy Coder B on: 11/21/2016 12:03 PM   Modules accepted: Orders

## 2016-11-21 NOTE — Progress Notes (Signed)
  [facility] OFFICE PROGRESS NOTE   Diagnosis:  Small cell lung cancer  INTERVAL HISTORY:   Ms. Kreger returns as scheduled. She completed cycle 5 etoposide/carboplatin beginning 10/24/2016. Day 13 CBC on 11/05/2016 showed thrombocytopenia with a platelet count of 47,000. Follow-up CBC on 11/08/2016 showed improvement with a platelet count of 68,000.  She overall is feeling well. She denies nausea. No mouth sores. No diarrhea. No shortness of breath. No bleeding. She has good appetite. She has gained some weight. She continues to have intermittent pain at the right scapula and right anterior iliac region. She takes hydrocodone as needed.  Objective:  Vital signs in last 24 hours:  Blood pressure (!) 116/49, pulse 98, temperature 98.8 F (37.1 C), temperature source Oral, resp. rate 18, height 5\' 9"  (1.753 m), weight 126 lb 12.8 oz (57.5 kg), SpO2 99 %.    HEENT: No thrush or ulcers. Resp: Scattered expiratory rhonchi. No respiratory distress. Cardio: Irregular. Rate within normal limits. GI: No hepatomegaly. Vascular: No leg edema.  Lab Results:  Lab Results  Component Value Date   WBC 9.1 11/21/2016   HGB 12.9 11/21/2016   HCT 38.8 11/21/2016   MCV 98.2 11/21/2016   PLT 106 (L) 11/21/2016   NEUTROABS 2.5 11/21/2016    Imaging:  No results found.  Medications: I have reviewed the patient's current medications.  Assessment/Plan: 1. 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 COPD 3. 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 Atrial fibrillation.Cardizem discontinued due to a skin rash. Follow-up with cardiology. 5. Leukocytosis secondary to CLL and steroids 6. Rash 07/25/2016-appearedto have a drug rash, could be related to antibiotics given earlier duringhospital admission, or contrast for the brain MRI; recurrent rash 08/03/2016--cardizem discontinued. Rash resolved 08/15/2016. 7. Oral candidiasis-Diflucan added 07/25/2016, resolved   Disposition: Ms. Corker appears stable. She has completed 5 cycles of etoposide/carboplatin. Plan to proceed with the sixth and final cycle today as scheduled.   The carboplatin will be dose reduced due to mild thrombocytopenia. She will return for a nadir CBC on 12/05/2016. She understands to contact the office with bleeding, fever, chills, other signs of infection.  She will return for a follow-up visit in 4 weeks. Restaging CTs will be obtained in 3 months.  Plan reviewed with Dr. Benay Spice.    Ned Card ANP/GNP-BC   11/21/2016  11:08 AM

## 2016-11-21 NOTE — Patient Instructions (Signed)
[facility] Discharge Instructions for Patients Receiving Chemotherapy  Today you received the following chemotherapy agents:  carboplatin and Etoposide.  To help prevent nausea and vomiting after your treatment, we encourage you to take your nausea medication as directed.   If you develop nausea and vomiting that is not controlled by your nausea medication, call the clinic.   BELOW ARE SYMPTOMS THAT SHOULD BE REPORTED IMMEDIATELY:  *FEVER GREATER THAN 100.5 F  *CHILLS WITH OR WITHOUT FEVER  NAUSEA AND VOMITING THAT IS NOT CONTROLLED WITH YOUR NAUSEA MEDICATION  *UNUSUAL SHORTNESS OF BREATH  *UNUSUAL BRUISING OR BLEEDING  TENDERNESS IN MOUTH AND THROAT WITH OR WITHOUT PRESENCE OF ULCERS  *URINARY PROBLEMS  *BOWEL PROBLEMS  UNUSUAL RASH Items with * indicate a potential emergency and should be followed up as soon as possible.  Feel free to call the clinic you have any questions or concerns. The clinic phone number is [phone].  Please show the [place] at check-in to the Emergency Department and triage nurse.

## 2016-11-22 ENCOUNTER — Ambulatory Visit (HOSPITAL_BASED_OUTPATIENT_CLINIC_OR_DEPARTMENT_OTHER): Payer: Medicare HMO

## 2016-11-22 VITALS — BP 118/62 | HR 93 | Temp 98.9°F | Resp 18

## 2016-11-22 DIAGNOSIS — C3492 Malignant neoplasm of unspecified part of left bronchus or lung: Secondary | ICD-10-CM | POA: Diagnosis not present

## 2016-11-22 DIAGNOSIS — Z5111 Encounter for antineoplastic chemotherapy: Secondary | ICD-10-CM | POA: Diagnosis not present

## 2016-11-22 MED ORDER — DEXAMETHASONE SODIUM PHOSPHATE 10 MG/ML IJ SOLN
INTRAMUSCULAR | Status: AC
  Filled 2016-11-22: qty 1

## 2016-11-22 MED ORDER — SODIUM CHLORIDE 0.9 % IV SOLN
10.0000 mg | Freq: Once | INTRAVENOUS | Status: AC
  Administered 2016-11-22: 10 mg via INTRAVENOUS
  Filled 2016-11-22: qty 1

## 2016-11-22 MED ORDER — SODIUM CHLORIDE 0.9 % IV SOLN
Freq: Once | INTRAVENOUS | Status: AC
  Administered 2016-11-22: 10:00:00 via INTRAVENOUS

## 2016-11-22 MED ORDER — SODIUM CHLORIDE 0.9 % IV SOLN
80.0000 mg/m2 | Freq: Once | INTRAVENOUS | Status: AC
  Administered 2016-11-22: 130 mg via INTRAVENOUS
  Filled 2016-11-22: qty 6.5

## 2016-11-22 NOTE — Patient Instructions (Signed)
[facility] Discharge Instructions for Patients Receiving Chemotherapy  Today you received the following chemotherapy agents Etoposide  To help prevent nausea and vomiting after your treatment, we encourage you to take your nausea medication : As directed   If you develop nausea and vomiting that is not controlled by your nausea medication, call the clinic.   BELOW ARE SYMPTOMS THAT SHOULD BE REPORTED IMMEDIATELY:  *FEVER GREATER THAN 100.5 F  *CHILLS WITH OR WITHOUT FEVER  NAUSEA AND VOMITING THAT IS NOT CONTROLLED WITH YOUR NAUSEA MEDICATION  *UNUSUAL SHORTNESS OF BREATH  *UNUSUAL BRUISING OR BLEEDING  TENDERNESS IN MOUTH AND THROAT WITH OR WITHOUT PRESENCE OF ULCERS  *URINARY PROBLEMS  *BOWEL PROBLEMS  UNUSUAL RASH Items with * indicate a potential emergency and should be followed up as soon as possible.  Feel free to call the clinic you have any questions or concerns. The clinic phone number is [phone].  Please show the [place] at check-in to the Emergency Department and triage nurse.

## 2016-11-23 ENCOUNTER — Ambulatory Visit (HOSPITAL_BASED_OUTPATIENT_CLINIC_OR_DEPARTMENT_OTHER): Payer: Medicare HMO

## 2016-11-23 VITALS — BP 130/70 | HR 95 | Temp 98.3°F | Resp 17

## 2016-11-23 DIAGNOSIS — Z5111 Encounter for antineoplastic chemotherapy: Secondary | ICD-10-CM | POA: Diagnosis not present

## 2016-11-23 DIAGNOSIS — C3492 Malignant neoplasm of unspecified part of left bronchus or lung: Secondary | ICD-10-CM | POA: Diagnosis not present

## 2016-11-23 MED ORDER — DEXAMETHASONE SODIUM PHOSPHATE 10 MG/ML IJ SOLN: 10.0000 mg | Freq: Once | INTRAMUSCULAR | Status: DC

## 2016-11-23 MED ORDER — SODIUM CHLORIDE 0.9 % IV SOLN
Freq: Once | INTRAVENOUS | Status: AC
  Administered 2016-11-23: 11:00:00 via INTRAVENOUS

## 2016-11-23 MED ORDER — SODIUM CHLORIDE 0.9 % IV SOLN
80.0000 mg/m2 | Freq: Once | INTRAVENOUS | Status: AC
  Administered 2016-11-23: 130 mg via INTRAVENOUS
  Filled 2016-11-23: qty 6.5

## 2016-11-23 MED ORDER — SODIUM CHLORIDE 0.9 % IV SOLN: 10.0000 mg | Freq: Once | INTRAVENOUS | Status: DC

## 2016-11-23 MED ORDER — SODIUM CHLORIDE 0.9 % IV SOLN
10.0000 mg | Freq: Once | INTRAVENOUS | Status: AC
  Administered 2016-11-23: 10 mg via INTRAVENOUS
  Filled 2016-11-23: qty 1

## 2016-11-23 NOTE — Patient Instructions (Signed)
Sheri Ayala Discharge Instructions for Patients Receiving Chemotherapy  Today you received the following chemotherapy agents Etoposide  To help prevent nausea and vomiting after your treatment, we encourage you to take your nausea medication : As directed   If you develop nausea and vomiting that is not controlled by your nausea medication, call the clinic.   BELOW ARE SYMPTOMS THAT SHOULD BE REPORTED IMMEDIATELY:  *FEVER GREATER THAN 100.5 F  *CHILLS WITH OR WITHOUT FEVER  NAUSEA AND VOMITING THAT IS NOT CONTROLLED WITH YOUR NAUSEA MEDICATION  *UNUSUAL SHORTNESS OF BREATH  *UNUSUAL BRUISING OR BLEEDING  TENDERNESS IN MOUTH AND THROAT WITH OR WITHOUT PRESENCE OF ULCERS  *URINARY PROBLEMS  *BOWEL PROBLEMS  UNUSUAL RASH Items with * indicate a potential emergency and should be followed up as soon as possible.  Feel free to call the clinic you have any questions or concerns. The clinic phone number is [phone].  Please show the [place] at check-in to the Emergency Department and triage nurse.

## 2016-12-05 ENCOUNTER — Other Ambulatory Visit: Payer: Medicare HMO

## 2016-12-20 ENCOUNTER — Ambulatory Visit: Payer: Medicare HMO | Admitting: Oncology

## 2016-12-20 ENCOUNTER — Other Ambulatory Visit: Payer: Medicare HMO

## 2016-12-24 ENCOUNTER — Other Ambulatory Visit: Payer: Self-pay

## 2016-12-24 ENCOUNTER — Telehealth: Payer: Self-pay

## 2016-12-24 ENCOUNTER — Other Ambulatory Visit (HOSPITAL_BASED_OUTPATIENT_CLINIC_OR_DEPARTMENT_OTHER): Payer: Medicare HMO

## 2016-12-24 ENCOUNTER — Ambulatory Visit (HOSPITAL_BASED_OUTPATIENT_CLINIC_OR_DEPARTMENT_OTHER): Payer: Medicare HMO | Admitting: Oncology

## 2016-12-24 VITALS — BP 140/114 | HR 103 | Temp 98.4°F | Resp 18 | Ht 69.0 in | Wt 128.1 lb

## 2016-12-24 DIAGNOSIS — C3492 Malignant neoplasm of unspecified part of left bronchus or lung: Secondary | ICD-10-CM

## 2016-12-24 DIAGNOSIS — I4891 Unspecified atrial fibrillation: Secondary | ICD-10-CM

## 2016-12-24 DIAGNOSIS — C9110 Chronic lymphocytic leukemia of B-cell type not having achieved remission: Secondary | ICD-10-CM

## 2016-12-24 LAB — CBC WITH DIFFERENTIAL/PLATELET
BASO%: 0.2 % (ref 0.0–2.0)
BASOS ABS: 0 10*3/uL (ref 0.0–0.1)
EOS ABS: 0 10*3/uL (ref 0.0–0.5)
EOS%: 0.3 % (ref 0.0–7.0)
HCT: 40.3 % (ref 34.8–46.6)
HEMOGLOBIN: 13.1 g/dL (ref 11.6–15.9)
LYMPH%: 56.2 % — AB (ref 14.0–49.7)
MCH: 31.6 pg (ref 25.1–34.0)
MCHC: 32.5 g/dL (ref 31.5–36.0)
MCV: 97.3 fL (ref 79.5–101.0)
MONO#: 0.4 10*3/uL (ref 0.1–0.9)
MONO%: 3.3 % (ref 0.0–14.0)
NEUT#: 5.2 10*3/uL (ref 1.5–6.5)
NEUT%: 40 % (ref 38.4–76.8)
Platelets: 97 10*3/uL — ABNORMAL LOW (ref 145–400)
RBC: 4.14 10*6/uL (ref 3.70–5.45)
RDW: 14.5 % (ref 11.2–14.5)
WBC: 12.9 10*3/uL — ABNORMAL HIGH (ref 3.9–10.3)
lymph#: 7.2 10*3/uL — ABNORMAL HIGH (ref 0.9–3.3)

## 2016-12-24 MED ORDER — HYDROCODONE-ACETAMINOPHEN 5-325 MG PO TABS: 1.0000 | ORAL_TABLET | Freq: Four times a day (QID) | ORAL | 0 refills | Status: DC | PRN

## 2016-12-24 NOTE — Progress Notes (Signed)
[facility] OFFICE PROGRESS NOTE   Diagnosis: Small cell lung cancer  INTERVAL HISTORY:   Sheri Ayala returns as scheduled. She completed a final planned cycle of chemotherapy beginning 11/21/2016. She reports feeling well. Good appetite. No dyspnea. She continues to have pain at the right lateral iliac when lying on her right side. No pain at other times. She has mild pain at the right upper back.   Objective:  Vital signs in last 24 hours:  Blood pressure (!) 140/114, pulse (!) 103, temperature 98.4 F (36.9 C), temperature source Oral, resp. rate 18, height 5\' 9"  (1.753 m), weight 128 lb 1.6 oz (58.1 kg), SpO2 94 %.    HEENT: No thrush, erythema and? Early ulcerations at the posterior palate Resp: Distant breath sounds, bronchial sounds at the left upper posterior chest, no respiratory distress Cardio: Irregular GI: No hepatosplenomegaly Vascular: No leg edema Musculoskeletal: No tenderness at the right iliac    Lab Results:  Lab Results  Component Value Date   WBC 12.9 (H) 12/24/2016   HGB 13.1 12/24/2016   HCT 40.3 12/24/2016   MCV 97.3 12/24/2016   PLT 97 (L) 12/24/2016   NEUTROABS 5.2 12/24/2016    CMP     Component Value Date/Time   NA 140 11/21/2016 1038   K 4.1 11/21/2016 1038   CL 101 07/27/2016 0559   CO2 25 11/21/2016 1038   GLUCOSE 71 11/21/2016 1038   BUN 16.3 11/21/2016 1038   CREATININE 0.7 11/21/2016 1038   CALCIUM 9.0 11/21/2016 1038   PROT 5.3 (L) 11/21/2016 1038   ALBUMIN 3.5 11/21/2016 1038   AST 16 11/21/2016 1038   ALT 12 11/21/2016 1038   ALKPHOS 137 11/21/2016 1038   BILITOT 0.32 11/21/2016 1038   GFRNONAA >60 07/27/2016 0559   GFRAA >60 07/27/2016 0559     Medications: I have reviewed the patient's current medications.  Assessment/Plan: 1. 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 COPD 3. 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 Atrial fibrillation.Cardizem discontinued due to a skin rash. Follow-up with cardiology. 5. Leukocytosis secondary to CLL and steroids 6. Rash 07/25/2016-appearedto have a drug rash, could be related to antibiotics given earlier duringhospital admission, or contrast for the brain MRI; recurrent rash 08/03/2016--cardizem discontinued. Rash resolved 08/15/2016. 7. Oral candidiasis-Diflucan added 07/25/2016, resolved    Disposition:  Sheri Ayala appears well. She completed 6 cycles of etoposide/carboplatin chemotherapy. She is in clinical remission from small cell lung cancer. She will be scheduled for a restaging chest CT and office visit in 2 months.  The blood pressure was elevated on the machine reading today. This is likely related to an inaccurate reading in the setting of atrial fibrillation. We will ask her to go to her primary physician for a blood pressure check tomorrow.  15 minutes were spent with the patient today. The majority of the time was used for counseling and  coordination of care.  Donneta Romberg, MD  12/24/2016  4:09 PM

## 2016-12-24 NOTE — Telephone Encounter (Signed)
Attempted to call patient, unable to leave VM.

## 2016-12-25 ENCOUNTER — Telehealth: Payer: Self-pay | Admitting: Oncology

## 2016-12-25 NOTE — Telephone Encounter (Signed)
Spoke with patient regarding her upcoming appts.

## 2016-12-25 NOTE — Telephone Encounter (Signed)
Spoke with patient about appts. Did not want avs or calendar.

## 2017-02-19 ENCOUNTER — Other Ambulatory Visit: Payer: Medicare HMO

## 2017-02-19 ENCOUNTER — Other Ambulatory Visit: Payer: Self-pay | Admitting: Emergency Medicine

## 2017-02-19 ENCOUNTER — Other Ambulatory Visit (HOSPITAL_BASED_OUTPATIENT_CLINIC_OR_DEPARTMENT_OTHER): Payer: Medicare HMO

## 2017-02-19 ENCOUNTER — Encounter: Payer: Self-pay | Admitting: Nurse Practitioner

## 2017-02-19 ENCOUNTER — Encounter (HOSPITAL_COMMUNITY): Payer: Self-pay

## 2017-02-19 ENCOUNTER — Ambulatory Visit (HOSPITAL_COMMUNITY)
Admission: RE | Admit: 2017-02-19 | Discharge: 2017-02-19 | Disposition: A | Discharge status: Home / Self Care | Payer: Medicare HMO | Source: Ambulatory Visit | Attending: Oncology | Admitting: Oncology

## 2017-02-19 ENCOUNTER — Ambulatory Visit (HOSPITAL_BASED_OUTPATIENT_CLINIC_OR_DEPARTMENT_OTHER): Payer: Medicare HMO | Admitting: Nurse Practitioner

## 2017-02-19 VITALS — BP 126/77 | HR 92 | Temp 98.8°F | Resp 18 | Ht 69.0 in | Wt 131.2 lb

## 2017-02-19 DIAGNOSIS — Z85118 Personal history of other malignant neoplasm of bronchus and lung: Secondary | ICD-10-CM | POA: Diagnosis not present

## 2017-02-19 DIAGNOSIS — R590 Localized enlarged lymph nodes: Secondary | ICD-10-CM | POA: Diagnosis not present

## 2017-02-19 DIAGNOSIS — I700 Atherosclerosis of aorta: Secondary | ICD-10-CM | POA: Diagnosis not present

## 2017-02-19 DIAGNOSIS — D72828 Other elevated white blood cell count: Secondary | ICD-10-CM | POA: Diagnosis not present

## 2017-02-19 DIAGNOSIS — C3492 Malignant neoplasm of unspecified part of left bronchus or lung: Secondary | ICD-10-CM

## 2017-02-19 DIAGNOSIS — J432 Centrilobular emphysema: Secondary | ICD-10-CM | POA: Insufficient documentation

## 2017-02-19 DIAGNOSIS — C9110 Chronic lymphocytic leukemia of B-cell type not having achieved remission: Secondary | ICD-10-CM

## 2017-02-19 DIAGNOSIS — I2510 Atherosclerotic heart disease of native coronary artery without angina pectoris: Secondary | ICD-10-CM | POA: Diagnosis not present

## 2017-02-19 DIAGNOSIS — R918 Other nonspecific abnormal finding of lung field: Secondary | ICD-10-CM | POA: Diagnosis not present

## 2017-02-19 LAB — CBC WITH DIFFERENTIAL/PLATELET
BASO%: 0.2 % (ref 0.0–2.0)
Basophils Absolute: 0 10*3/uL (ref 0.0–0.1)
EOS ABS: 0.1 10*3/uL (ref 0.0–0.5)
EOS%: 0.4 % (ref 0.0–7.0)
HEMATOCRIT: 41.4 % (ref 34.8–46.6)
HGB: 13.3 g/dL (ref 11.6–15.9)
LYMPH#: 7.9 10*3/uL — AB (ref 0.9–3.3)
LYMPH%: 54 % — ABNORMAL HIGH (ref 14.0–49.7)
MCH: 29.3 pg (ref 25.1–34.0)
MCHC: 32.1 g/dL (ref 31.5–36.0)
MCV: 91.2 fL (ref 79.5–101.0)
MONO#: 0.3 10*3/uL (ref 0.1–0.9)
MONO%: 2.2 % (ref 0.0–14.0)
NEUT#: 6.3 10*3/uL (ref 1.5–6.5)
NEUT%: 43.2 % (ref 38.4–76.8)
PLATELETS: 103 10*3/uL — AB (ref 145–400)
RBC: 4.54 10*6/uL (ref 3.70–5.45)
RDW: 15 % — ABNORMAL HIGH (ref 11.2–14.5)
WBC: 14.7 10*3/uL — ABNORMAL HIGH (ref 3.9–10.3)

## 2017-02-19 LAB — COMPREHENSIVE METABOLIC PANEL
ALBUMIN: 4.1 g/dL (ref 3.5–5.0)
ALK PHOS: 106 U/L (ref 40–150)
ALT: 8 U/L (ref 0–55)
AST: 16 U/L (ref 5–34)
Anion Gap: 9 mEq/L (ref 3–11)
BUN: 16.8 mg/dL (ref 7.0–26.0)
CHLORIDE: 107 meq/L (ref 98–109)
CO2: 26 meq/L (ref 22–29)
Calcium: 9.1 mg/dL (ref 8.4–10.4)
Creatinine: 0.8 mg/dL (ref 0.6–1.1)
GLUCOSE: 83 mg/dL (ref 70–140)
POTASSIUM: 3.8 meq/L (ref 3.5–5.1)
SODIUM: 142 meq/L (ref 136–145)
Total Bilirubin: 0.33 mg/dL (ref 0.20–1.20)
Total Protein: 6.5 g/dL (ref 6.4–8.3)

## 2017-02-19 LAB — TECHNOLOGIST REVIEW

## 2017-02-19 MED ORDER — IOPAMIDOL (ISOVUE-300) INJECTION 61%
75.0000 mL | Freq: Once | INTRAVENOUS | Status: AC | PRN
  Administered 2017-02-19: 75 mL via INTRAVENOUS

## 2017-02-19 MED ORDER — HYDROCODONE-ACETAMINOPHEN 5-325 MG PO TABS: 1.0000 | ORAL_TABLET | Freq: Four times a day (QID) | ORAL | 0 refills | Status: DC | PRN

## 2017-02-19 MED ORDER — IOPAMIDOL (ISOVUE-300) INJECTION 61%
INTRAVENOUS | Status: AC
  Filled 2017-02-19: qty 75

## 2017-02-19 NOTE — Progress Notes (Addendum)
  [facility] OFFICE PROGRESS NOTE   Diagnosis: Small cell lung cancer  INTERVAL HISTORY:   Ms. Rudzinski returns as scheduled.  Has intermittent pain at the right shoulder and low back.  No shortness of breath or wheezing.  She reports a good appetite.  Objective:  Vital signs in last 24 hours:  Blood pressure 126/77, pulse (!) 106, temperature 98.8 F (37.1 C), temperature source Oral, SpO2 99 %.    HEENT: No thrush or ulcers. Lymphatics: No palpable cervical, supraclavicular or axillary lymph nodes. Resp: Distant breath sounds.  No respiratory distress. Cardio: Regular rate and rhythm. GI: No hepatosplenomegaly. Vascular: No leg edema.  Lab Results:  Lab Results  Component Value Date   WBC 14.7 (H) 02/19/2017   HGB 13.3 02/19/2017   HCT 41.4 02/19/2017   MCV 91.2 02/19/2017   PLT 103 (L) 02/19/2017   NEUTROABS 6.3 02/19/2017    Imaging:  No results found.  Medications: I have reviewed the patient's current medications.  Assessment/Plan: 1. 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 COPD 3. 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 Atrial fibrillation.Cardizem discontinued due to a skin rash. Follow-up with cardiology. 5. Leukocytosis secondary to CLL and steroids 6. Rash 07/25/2016-appearedto have a drug rash, could be related to antibiotics given earlier duringhospital admission, or contrast for the brain MRI; recurrent rash 08/03/2016--cardizem discontinued. Rash resolved 08/15/2016. 7. Oral candidiasis-Diflucan added 07/25/2016, resolved   Disposition: Ms. Torrez remains in clinical remission from small cell lung cancer.  She had a chest CT earlier today.  We will contact her once a final report is issued.  She continues to have pain at the right scapula.  She was provided with a new hydrocodone prescription at today's visit.  She will return for a follow-up visit in 8 weeks.  She will contact the office in the interim with any problems.  Patient seen with Dr. Benay Spice.    Ned Card ANP/GNP-BC   02/19/2017  3:42 PM  This was a shared visit with Ned Card.  I reviewed the chest CT images.  She is in clinical remission from small cell lung cancer.  There is no indication for treating the CLL at present.  She will return for an office visit in 2 months.  Julieanne Manson, MD

## 2017-02-21 ENCOUNTER — Ambulatory Visit: Payer: Medicare HMO | Admitting: Nurse Practitioner

## 2017-03-26 ENCOUNTER — Telehealth: Payer: Self-pay | Admitting: Oncology

## 2017-03-26 NOTE — Telephone Encounter (Signed)
03/26/17 faxed medical records (last clinic notes, medication list and last lab reports) to [facility] @ [phone] Attention: Jeanmarie Plant.

## 2017-04-04 ENCOUNTER — Ambulatory Visit (HOSPITAL_BASED_OUTPATIENT_CLINIC_OR_DEPARTMENT_OTHER): Payer: Medicare HMO | Admitting: Medical

## 2017-04-04 ENCOUNTER — Ambulatory Visit (HOSPITAL_COMMUNITY)
Admission: RE | Admit: 2017-04-04 | Discharge: 2017-04-04 | Disposition: A | Discharge status: Home / Self Care | Payer: Medicare HMO | Source: Ambulatory Visit | Attending: Oncology | Admitting: Oncology

## 2017-04-04 ENCOUNTER — Telehealth: Payer: Self-pay | Admitting: *Deleted

## 2017-04-04 VITALS — BP 153/70 | HR 56 | Temp 98.6°F | Resp 18 | Ht 69.0 in | Wt 136.6 lb

## 2017-04-04 DIAGNOSIS — R0600 Dyspnea, unspecified: Secondary | ICD-10-CM | POA: Insufficient documentation

## 2017-04-04 DIAGNOSIS — C9110 Chronic lymphocytic leukemia of B-cell type not having achieved remission: Secondary | ICD-10-CM

## 2017-04-04 DIAGNOSIS — R509 Fever, unspecified: Secondary | ICD-10-CM

## 2017-04-04 DIAGNOSIS — C9190 Lymphoid leukemia, unspecified not having achieved remission: Secondary | ICD-10-CM | POA: Diagnosis not present

## 2017-04-04 DIAGNOSIS — C3492 Malignant neoplasm of unspecified part of left bronchus or lung: Secondary | ICD-10-CM | POA: Diagnosis not present

## 2017-04-04 DIAGNOSIS — J441 Chronic obstructive pulmonary disease with (acute) exacerbation: Secondary | ICD-10-CM | POA: Diagnosis not present

## 2017-04-04 LAB — TECHNOLOGIST REVIEW

## 2017-04-04 LAB — COMPREHENSIVE METABOLIC PANEL
ALBUMIN: 4.4 g/dL (ref 3.5–5.0)
ALK PHOS: 122 U/L (ref 40–150)
ALT: 20 U/L (ref 0–55)
ANION GAP: 12 meq/L — AB (ref 3–11)
AST: 22 U/L (ref 5–34)
BILIRUBIN TOTAL: 0.91 mg/dL (ref 0.20–1.20)
BUN: 13.7 mg/dL (ref 7.0–26.0)
CALCIUM: 9.5 mg/dL (ref 8.4–10.4)
CHLORIDE: 104 meq/L (ref 98–109)
CO2: 25 mEq/L (ref 22–29)
CREATININE: 0.7 mg/dL (ref 0.6–1.1)
EGFR: >60 mL/min/{1.73_m2} (ref >60)
Glucose: 112 mg/dl (ref 70–140)
Potassium: 4.9 mEq/L (ref 3.5–5.1)
Sodium: 140 mEq/L (ref 136–145)
Total Protein: 6.7 g/dL (ref 6.4–8.3)

## 2017-04-04 LAB — CBC WITH DIFFERENTIAL/PLATELET
BASO%: 0.3 % (ref 0.0–2.0)
Basophils Absolute: 0.1 10*3/uL (ref 0.0–0.1)
EOS ABS: 0 10*3/uL (ref 0.0–0.5)
EOS%: 0.1 % (ref 0.0–7.0)
HCT: 40.9 % (ref 34.8–46.6)
HEMOGLOBIN: 12.9 g/dL (ref 11.6–15.9)
LYMPH%: 45.8 % (ref 14.0–49.7)
MCH: 27.5 pg (ref 25.1–34.0)
MCHC: 31.5 g/dL (ref 31.5–36.0)
MCV: 87.5 fL (ref 79.5–101.0)
MONO#: 0.4 10*3/uL (ref 0.1–0.9)
MONO%: 2.7 % (ref 0.0–14.0)
NEUT%: 51.1 % (ref 38.4–76.8)
NEUTROS ABS: 8.2 10*3/uL — AB (ref 1.5–6.5)
Platelets: 138 10*3/uL — ABNORMAL LOW (ref 145–400)
RBC: 4.68 10*6/uL (ref 3.70–5.45)
RDW: 15.6 % — AB (ref 11.2–14.5)
WBC: 16.1 10*3/uL — AB (ref 3.9–10.3)
lymph#: 7.4 10*3/uL — ABNORMAL HIGH (ref 0.9–3.3)

## 2017-04-04 MED ORDER — HYDROCODONE-ACETAMINOPHEN 5-325 MG PO TABS: 1.0000 | ORAL_TABLET | Freq: Four times a day (QID) | ORAL | 0 refills | Status: DC | PRN

## 2017-04-04 MED ORDER — SULFAMETHOXAZOLE-TRIMETHOPRIM 800-160 MG PO TABS: 1.0000 | ORAL_TABLET | Freq: Two times a day (BID) | ORAL | 0 refills | Status: DC

## 2017-04-04 MED ORDER — PREDNISONE 10 MG PO TABS: ORAL_TABLET | ORAL | 0 refills | Status: DC

## 2017-04-04 NOTE — Progress Notes (Signed)
These preliminary result these preliminary results were noted.  Awaiting final report.

## 2017-04-04 NOTE — Telephone Encounter (Signed)
Call from pt: she thinks her blood counts may be dropping. Pt reports nights sweats for past 3 days. She denies any fever/ chills. No bleeding or bruising. Appetite is OK.  Pt wonders if she should be seen sooner than 1/7 visit. Reviewed with Dr. Benay Spice. Order received for [facility] visit and chest Xray today.  Called pt, she will arrange transportation.

## 2017-04-05 NOTE — Progress Notes (Signed)
Symptoms Management Clinic Progress Note   Sheri Ayala [id number] 08-12-1950 65 y.o.  Sheri Ayala is managed by Dr. Ladell Pier   Actively treated with chemotherapy: no   Assessment: Plan:    Fever, unspecified - Plan: CBC with Differential, Comprehensive metabolic panel  Small cell lung cancer, left ([place]) - Plan: HYDROcodone-acetaminophen (NORCO/VICODIN) 5-325 MG tablet  COPD with acute exacerbation ([place]) - Plan: sulfamethoxazole-trimethoprim (BACTRIM DS,SEPTRA DS) 800-160 MG tablet, predniSONE (DELTASONE) 10 MG tablet   Fever: A CBC and chemistry panel are collected today.  Small cell lung cancer: The patient continues to be managed conservatively by Dr. Ladell Pier.  She will follow up with him as scheduled on 04/22/2017.  COPD with acute exacerbation: Patient was given prescription for Bactrim DS p.o. twice daily times 7 days and was additionally given a prednisone taper.  Please see After Visit Summary for patient specific instructions.  Future Appointments  Date Time Provider [place]  04/22/2017  3:00 PM [facility] LAB 1 [facility] None  04/22/2017  3:30 PM Benay Spice [place] Price, MD [facility] None    Orders Placed This Encounter  Procedures  . CBC with Differential  . Comprehensive metabolic panel       Subjective:   Patient ID:  Sheri Ayala is a 66 y.o. (DOB 30-Dec-1950) female.  Chief Complaint:  Chief Complaint  Patient presents with  . Shortness of Breath    HPI Sheri Ayala is a 66 year old female with a early stage CLL  managed with watchful waiting, a history of a small cell lung cancer of the left lung with mediastinal adenopathy treated with 6 cycles of carboplatin and etoposide last dosed on 11/21/2016, and COPD.  She presents to the office today with several days of "not feeling well, with increasing shortness of breath, a productive cough with yellowish sputum, night sweats, and right shoulder and right neck pain.  She request a  refill of her hydrocodone.  She has noted wheezing.  She denies fevers, chills, sore throat, postnasal drainage, or sinus pressure and pain.  Medications: I have reviewed the patient's current medications.  Allergies:  Allergies  Allergen Reactions  . Eliquis [Apixaban] Rash    Hives, and difficulty swallowing  . Cardizem Cd [Diltiazem Hcl Er Beads] Rash  . Citrus Other (See Comments)    Burning in stomach   . Penicillins Hives, Nausea And Vomiting, Swelling and Other (See Comments)    Patient reports tolerating ampicillin.  Has patient had a PCN reaction causing immediate rash, facial/tongue/throat swelling, SOB or lightheadedness with hypotension: Yes Has patient had a PCN reaction causing severe rash involving mucus membranes or skin necrosis: unknown Has patient had a PCN reaction that required hospitalization No Has patient had a PCN reaction occurring within the last 10 years: No If all of the above answers are "NO", then may proceed with Cephalosporin use.     Past Medical History:  Diagnosis Date  . Allergy   . Anxiety   . Asthma   . Atrial fibrillation ([place]) 04/2016  . Bronchospasm   . Cerebrovascular accident (CVA) ([place]) 08/28/2015  . Chronic lymphocytic leukemia ([place]) 2004  . COPD (chronic obstructive pulmonary disease) ([place])   . Depression   . Emphysema of lung ([place])   . Frontal sinusitis November 2012  . H/O viral encephalitis   . Headache(784.0)   . Heart murmur   . Mitral prolapse 11/24/2015  . Mitral regurgitation 11/24/2015  . Olecranon bursitis of left elbow  November 2012.   in past  . Pleurisy   . Pneumonia    13 times  . Rhinitis   . Thrombocytopenia ([place])   . Tobacco abuse   . Tricuspid valve regurgitation 11/24/2015    Past Surgical History:  Procedure Laterality Date  . ABDOMINAL HYSTERECTOMY    . COLONOSCOPY    . UPPER GASTROINTESTINAL ENDOSCOPY    . VIDEO BRONCHOSCOPY Bilateral 04/18/2016   Procedure: VIDEO BRONCHOSCOPY WITHOUT FLUORO;   Surgeon: Tanda Rockers, MD;  Location: [facility] ENDOSCOPY;  Service: Cardiopulmonary;  Laterality: Bilateral;  . VIDEO BRONCHOSCOPY WITH ENDOBRONCHIAL ULTRASOUND N/A 07/23/2016   Procedure: VIDEO BRONCHOSCOPY WITH ENDOBRONCHIAL ULTRASOUND;  Surgeon: Grace Isaac, MD;  Location: [facility] OR;  Service: Thoracic;  Laterality: N/A;    Family History  Problem Relation Age of Onset  . Aneurysm Mother   . Liver disease Father   . Congestive Heart Failure Brother   . Heart disease Brother   . Colon cancer Neg Hx   . Esophageal cancer Neg Hx   . Pancreatic cancer Neg Hx   . Rectal cancer Neg Hx   . Stomach cancer Neg Hx     Social History   Socioeconomic History  . Marital status: Single    Spouse name: Not on file  . Number of children: Not on file  . Years of education: Not on file  . Highest education level: Not on file  Social Needs  . Financial resource strain: Not on file  . Food insecurity - worry: Not on file  . Food insecurity - inability: Not on file  . Transportation needs - medical: Not on file  . Transportation needs - non-medical: Not on file  Occupational History  . Not on file  Tobacco Use  . Smoking status: Current Every Day Smoker    Packs/day: 0.50    Years: 45.00    Pack years: 22.50    Types: Cigarettes, E-cigarettes  . Smokeless tobacco: Never Used  Substance and Sexual Activity  . Alcohol use: No    Alcohol/week: 0.0 oz  . Drug use: No  . Sexual activity: Not Currently  Other Topics Concern  . Not on file  Social History Narrative  . Not on file    Past Medical History, Surgical history, Social history, and Family history were reviewed and updated as appropriate.   Please see review of systems for further details on the patient's review from today.   Review of Systems:  Review of Systems  Constitutional: Positive for diaphoresis. Negative for chills, fatigue and fever.  HENT: Negative for postnasal drip, rhinorrhea, sinus pressure, sinus pain and voice  change.   Respiratory: Positive for cough. Negative for chest tightness, shortness of breath and wheezing.   Cardiovascular: Negative for chest pain, palpitations and leg swelling.  Musculoskeletal: Positive for myalgias, neck pain and neck stiffness.  Neurological: Positive for headaches.    Objective:   Physical Exam:  BP (!) 153/70 (BP Location: Left Arm, Patient Position: Sitting) Comment: nurse aware  Pulse (!) 56   Temp 98.6 F (37 C) (Oral)   Resp 18   Ht 5\' 9"  (1.753 m)   Wt 136 lb 9.6 oz (62 kg)   SpO2 90%   BMI 20.17 kg/m   ECOG: 0  Physical Exam  Constitutional: No distress.  HENT:  Head: Normocephalic and atraumatic.  Right Ear: External ear normal.  Left Ear: External ear normal.  Mouth/Throat: Oropharynx is clear and moist. No oropharyngeal exudate.  Eyes:  Right eye exhibits no discharge. Left eye exhibits no discharge. No scleral icterus.  Neck: Normal range of motion. Neck supple.  Cardiovascular: Normal rate, regular rhythm and normal heart sounds. Exam reveals no gallop and no friction rub.  No murmur heard. Pulmonary/Chest: Effort normal. No respiratory distress. She has wheezes (Left lower lobe). She has no rales.  Musculoskeletal: She exhibits no edema.  Lymphadenopathy:    She has no cervical adenopathy.  Neurological: She is alert. Coordination normal.  Skin: Skin is warm and dry. No rash noted. She is not diaphoretic. No erythema.    Lab Review:     Component Value Date/Time   NA 140 04/04/2017 1241   K 4.9 04/04/2017 1241   CL 101 07/27/2016 0559   CO2 25 04/04/2017 1241   GLUCOSE 112 04/04/2017 1241   BUN 13.7 04/04/2017 1241   CREATININE 0.7 04/04/2017 1241   CALCIUM 9.5 04/04/2017 1241   PROT 6.7 04/04/2017 1241   ALBUMIN 4.4 04/04/2017 1241   AST 22 04/04/2017 1241   ALT 20 04/04/2017 1241   ALKPHOS 122 04/04/2017 1241   BILITOT 0.91 04/04/2017 1241   GFRNONAA >60 07/27/2016 0559   GFRAA >60 07/27/2016 0559       Component  Value Date/Time   WBC 16.1 (H) 04/04/2017 1241   WBC 23.2 (H) 07/27/2016 0559   RBC 4.68 04/04/2017 1241   RBC 3.92 07/27/2016 0559   HGB 12.9 04/04/2017 1241   HCT 40.9 04/04/2017 1241   PLT 138 (L) 04/04/2017 1241   MCV 87.5 04/04/2017 1241   MCH 27.5 04/04/2017 1241   MCH 27.6 07/27/2016 0559   MCHC 31.5 04/04/2017 1241   MCHC 30.0 07/27/2016 0559   RDW 15.6 (H) 04/04/2017 1241   LYMPHSABS 7.4 (H) 04/04/2017 1241   MONOABS 0.4 04/04/2017 1241   EOSABS 0.0 04/04/2017 1241   BASOSABS 0.1 04/04/2017 1241   -------------------------------  Imaging from last 24 hours (if applicable):  Radiology interpretation: Dg Chest 2 View  Result Date: 04/04/2017 CLINICAL DATA:  Dyspnea for 3 days EXAM: CHEST  2 VIEW COMPARISON:  CT 02/19/2017, radiograph 07/25/2016, 07/24/2016 FINDINGS: Mild hyper aeration. No focal consolidation or effusion. Cardiomediastinal silhouette within normal limits. Mild apical scarring. No pneumothorax. Mild scoliosis of the spine. IMPRESSION: No active cardiopulmonary disease. Electronically Signed   By: Donavan Foil M.D.   On: 04/04/2017 15:27        This case was discussed with Dr. Benay Spice. He expressed agreement with my management of this patient.

## 2017-04-08 ENCOUNTER — Telehealth: Payer: Self-pay | Admitting: Medical

## 2017-04-08 NOTE — Telephone Encounter (Signed)
No 12/20 los.

## 2017-04-16 DIAGNOSIS — C7931 Secondary malignant neoplasm of brain: Secondary | ICD-10-CM

## 2017-04-16 HISTORY — DX: Secondary malignant neoplasm of brain: C79.31

## 2017-04-20 ENCOUNTER — Emergency Department: Payer: Medicare HMO

## 2017-04-20 ENCOUNTER — Other Ambulatory Visit: Payer: Self-pay

## 2017-04-20 ENCOUNTER — Encounter: Payer: Self-pay | Admitting: Emergency Medicine

## 2017-04-20 ENCOUNTER — Emergency Department
Admission: EM | Admit: 2017-04-20 | Discharge: 2017-04-20 | Disposition: A | Discharge status: Home / Self Care | Payer: Medicare HMO | Attending: Emergency Medicine | Admitting: Emergency Medicine

## 2017-04-20 DIAGNOSIS — C3410 Malignant neoplasm of upper lobe, unspecified bronchus or lung: Secondary | ICD-10-CM | POA: Insufficient documentation

## 2017-04-20 DIAGNOSIS — R4189 Other symptoms and signs involving cognitive functions and awareness: Secondary | ICD-10-CM | POA: Diagnosis not present

## 2017-04-20 DIAGNOSIS — Z79899 Other long term (current) drug therapy: Secondary | ICD-10-CM | POA: Insufficient documentation

## 2017-04-20 DIAGNOSIS — Z8673 Personal history of transient ischemic attack (TIA), and cerebral infarction without residual deficits: Secondary | ICD-10-CM | POA: Insufficient documentation

## 2017-04-20 DIAGNOSIS — J181 Lobar pneumonia, unspecified organism: Secondary | ICD-10-CM | POA: Diagnosis not present

## 2017-04-20 DIAGNOSIS — J449 Chronic obstructive pulmonary disease, unspecified: Secondary | ICD-10-CM | POA: Diagnosis not present

## 2017-04-20 DIAGNOSIS — J45909 Unspecified asthma, uncomplicated: Secondary | ICD-10-CM | POA: Diagnosis not present

## 2017-04-20 DIAGNOSIS — R531 Weakness: Secondary | ICD-10-CM | POA: Diagnosis not present

## 2017-04-20 DIAGNOSIS — J189 Pneumonia, unspecified organism: Secondary | ICD-10-CM

## 2017-04-20 DIAGNOSIS — F17210 Nicotine dependence, cigarettes, uncomplicated: Secondary | ICD-10-CM | POA: Insufficient documentation

## 2017-04-20 DIAGNOSIS — R0602 Shortness of breath: Secondary | ICD-10-CM | POA: Diagnosis present

## 2017-04-20 LAB — CBC
HCT: 39.7 % (ref 35.0–47.0)
Hemoglobin: 12.7 g/dL (ref 12.0–16.0)
MCH: 27.8 pg (ref 26.0–34.0)
MCHC: 32 g/dL (ref 32.0–36.0)
MCV: 86.9 fL (ref 80.0–100.0)
PLATELETS: 66 10*3/uL — AB (ref 150–440)
RBC: 4.57 MIL/uL (ref 3.80–5.20)
RDW: 16.5 % — ABNORMAL HIGH (ref 11.5–14.5)
WBC: 14.6 10*3/uL — ABNORMAL HIGH (ref 3.6–11.0)

## 2017-04-20 LAB — BASIC METABOLIC PANEL
Anion gap: 9 (ref 5–15)
BUN: 11 mg/dL (ref 6–20)
CALCIUM: 7.9 mg/dL — AB (ref 8.9–10.3)
CO2: 25 mmol/L (ref 22–32)
CREATININE: 0.7 mg/dL (ref 0.44–1.00)
Chloride: 108 mmol/L (ref 101–111)
GFR calc Af Amer: >60 mL/min (ref >60)
GFR calc non Af Amer: >60 mL/min (ref >60)
Glucose, Bld: 102 mg/dL — ABNORMAL HIGH (ref 65–99)
Potassium: 4.2 mmol/L (ref 3.5–5.1)
Sodium: 142 mmol/L (ref 135–145)

## 2017-04-20 LAB — LACTIC ACID, PLASMA: Lactic Acid, Venous: 0.7 mmol/L (ref 0.5–1.9)

## 2017-04-20 LAB — TROPONIN I

## 2017-04-20 MED ORDER — SODIUM CHLORIDE 0.9 % IV BOLUS (SEPSIS)
1000.0000 mL | Freq: Once | INTRAVENOUS | Status: AC
  Administered 2017-04-20: 1000 mL via INTRAVENOUS

## 2017-04-20 MED ORDER — LEVOFLOXACIN 750 MG PO TABS: 750.0000 mg | ORAL_TABLET | Freq: Every day | ORAL | 0 refills | Status: AC

## 2017-04-20 MED ORDER — LEVOFLOXACIN IN D5W 750 MG/150ML IV SOLN
750.0000 mg | Freq: Once | INTRAVENOUS | Status: AC
  Administered 2017-04-20: 750 mg via INTRAVENOUS
  Filled 2017-04-20: qty 150

## 2017-04-20 MED ORDER — LEVOFLOXACIN 750 MG PO TABS: 750.0000 mg | ORAL_TABLET | Freq: Every day | ORAL | 0 refills | Status: DC

## 2017-04-20 MED ORDER — HYDROCOD POLST-CPM POLST ER 10-8 MG/5ML PO SUER: 5.0000 mL | Freq: Two times a day (BID) | ORAL | Status: DC

## 2017-04-20 NOTE — ED Provider Notes (Addendum)
[facility] Emergency Department Provider Note ____________________________________________   I have reviewed the triage vital signs and the triage nursing note.  HISTORY  Chief Complaint Shortness of Breath   Historian Patient and son  HPI Sheri Ayala is a 67 y.o. female with a history of lung cancer, small cell, followed by oncology at [facility], also history of asthma and atrial fibrillation, states that she has had pneumonia recurrently, most recently was treated for bronchitis with a course of antibiotics for 10 days but ended about a week ago.  Patient states that she had actually been doing pretty well and then this morning she was out running errands and started to feel weak all over.  No chest pain or specific shortness of breath or any pleuritic chest pain.  No fever.  She states it took some help to get her back into her home.  Her son states that when he came over he found her less responsive.  EMS was called and they gave her a dose of Narcan and she seemed to become more responsive.  Patient does have hydrocodone for as needed pain, patient states she last took about 2 weeks ago.  She states her blood pressure typically runs about 90 systolic.  She states her oxygen level typically runs about 90% on room air.     Past Medical History:  Diagnosis Date  . Allergy   . Anxiety   . Asthma   . Atrial fibrillation ([place]) 04/2016  . Bronchospasm   . Cerebrovascular accident (CVA) ([place]) 08/28/2015  . Chronic lymphocytic leukemia ([place]) 2004  . COPD (chronic obstructive pulmonary disease) ([place])   . Depression   . Emphysema of lung ([place])   . Frontal sinusitis November 2012  . H/O viral encephalitis   . Headache(784.0)   . Heart murmur   . Mitral prolapse 11/24/2015  . Mitral regurgitation 11/24/2015  . Olecranon bursitis of left elbow November 2012.   in past  . Pleurisy   . Pneumonia    13 times  . Rhinitis   . Thrombocytopenia ([place])   .  Tobacco abuse   . Tricuspid valve regurgitation 11/24/2015    Patient Active Problem List   Diagnosis Date Noted  . PICC (peripherally inserted central catheter) flush 09/05/2016  . Port catheter in place 08/15/2016  . Small cell lung cancer, left ([place]) 07/25/2016  . Lobar pneumonia ([place]) 07/20/2016  . Hilar adenopathy 07/20/2016  . Leukocytosis 07/16/2016  . Obstructive atelectasis 07/15/2016  . S/P thoracentesis   . Pleural effusion on right   . History of CVA (cerebrovascular accident) 05/01/2016  . HCAP (healthcare-associated pneumonia) 04/30/2016  . Chronic a-fib ([place]) 04/30/2016  . Severe protein-calorie malnutrition ([place])   . Mass of left lung 04/10/2016  . Acute on chronic respiratory failure with hypoxia ([place]) 01/30/2015  . Right lower lobe pneumonia ([place]) 01/30/2015  . SOB (shortness of breath) 12/24/2012  . COPD with acute exacerbation ([place]) 12/22/2012  . Headache in front of head 03/27/2011  . Thrombocytopenia ([place]) 03/27/2011  . CLL (chronic lymphocytic leukemia) ([place]) 03/09/2011  . Cigarette smoker 03/09/2011    Past Surgical History:  Procedure Laterality Date  . ABDOMINAL HYSTERECTOMY    . COLONOSCOPY    . UPPER GASTROINTESTINAL ENDOSCOPY    . VIDEO BRONCHOSCOPY Bilateral 04/18/2016   Procedure: VIDEO BRONCHOSCOPY WITHOUT FLUORO;  Surgeon: Tanda Rockers, MD;  Location: [facility] ENDOSCOPY;  Service: Cardiopulmonary;  Laterality: Bilateral;  . VIDEO BRONCHOSCOPY WITH ENDOBRONCHIAL ULTRASOUND N/A 07/23/2016  Procedure: VIDEO BRONCHOSCOPY WITH ENDOBRONCHIAL ULTRASOUND;  Surgeon: Grace Isaac, MD;  Location: [facility] OR;  Service: Thoracic;  Laterality: N/A;    Prior to Admission medications   Medication Sig Start Date End Date Taking? Authorizing Provider  albuterol (PROVENTIL) (2.5 MG/3ML) 0.083% nebulizer solution Take 3 mLs (2.5 mg total) by nebulization every 4 (four) hours as needed for wheezing or shortness of breath. 06/21/13  Yes Francine Graven, DO  ALPRAZolam  Duanne Moron) 0.5 MG tablet Take 1 tablet (0.5 mg total) by mouth 2 (two) times daily as needed for anxiety or sleep. 05/05/16  Yes Tat, Shanon Brow, MD  budesonide-formoterol [facility]) 160-4.5 MCG/ACT inhaler Take 2 puffs first thing in am and then another 2 puffs about 12 hours later. 04/29/16  Yes Tanda Rockers, MD  HYDROcodone-acetaminophen (NORCO/VICODIN) 5-325 MG tablet Take 1 tablet by mouth every 6 (six) hours as needed for severe pain. 04/04/17  Yes Tanner, Lyndon Code., PA-C  OXYGEN Inhale 2 L into the lungs as needed (for shortness of breath).    Yes [provider]  pantoprazole (PROTONIX) 40 MG tablet Take 40 mg daily by mouth. 02/11/17  Yes [provider]  sertraline (ZOLOFT) 25 MG tablet Take 25 mg by mouth at bedtime.  04/05/16  Yes [provider]  temazepam (RESTORIL) 30 MG capsule Take 1 capsule (30 mg total) by mouth at bedtime. 05/05/16  Yes TatShanon Brow, MD  VENTOLIN HFA 108 [phone] Base) MCG/ACT inhaler Inhale 2 puffs into the lungs every 4 (four) hours as needed for wheezing or shortness of breath.  07/05/16  Yes [provider]  feeding supplement, ENSURE ENLIVE, (ENSURE ENLIVE) LIQD Take 237 mLs by mouth 2 (two) times daily between meals. 05/06/16   Orson Eva, MD  levofloxacin (LEVAQUIN) 750 MG tablet Take 1 tablet (750 mg total) by mouth daily for 7 days. 04/20/17 04/27/17  Lisa Roca, MD  polyethylene glycol [facility] / Floria Raveling) packet Take 17 g by mouth 2 (two) times daily. Patient not taking: Reported on 04/04/2017 07/26/16   Bonnielee Haff, MD  predniSONE (DELTASONE) 10 MG tablet 6 tab x 1 day, 5 tab x 1 day, 4 tab x 1 day, 3 tab x 1 day, 2 tab x 1 day, 1 tab x 1 day Patient not taking: Reported on 04/20/2017 04/04/17   Harle Stanford., PA-C  prochlorperazine (COMPAZINE) 10 MG tablet Take 0.5 tablets (5 mg total) by mouth every 6 (six) hours as needed for nausea or vomiting. Patient not taking: Reported on 04/20/2017 10/08/16   Ladell Pier, MD   sulfamethoxazole-trimethoprim (BACTRIM DS,SEPTRA DS) 800-160 MG tablet Take 1 tablet by mouth 2 (two) times daily. Patient not taking: Reported on 04/20/2017 04/04/17   Harle Stanford., PA-C    Allergies  Allergen Reactions  . Eliquis [Apixaban] Rash    Hives, and difficulty swallowing  . Cardizem Cd [Diltiazem Hcl Er Beads] Rash  . Citrus Other (See Comments)    Burning in stomach   . Penicillins Hives, Nausea And Vomiting, Swelling and Other (See Comments)    Patient reports tolerating ampicillin.  Has patient had a PCN reaction causing immediate rash, facial/tongue/throat swelling, SOB or lightheadedness with hypotension: Yes Has patient had a PCN reaction causing severe rash involving mucus membranes or skin necrosis: unknown Has patient had a PCN reaction that required hospitalization No Has patient had a PCN reaction occurring within the last 10 years: No If all of the above answers are "NO", then may proceed with Cephalosporin use.  Family History  Problem Relation Age of Onset  . Aneurysm Mother   . Liver disease Father   . Congestive Heart Failure Brother   . Heart disease Brother   . Colon cancer Neg Hx   . Esophageal cancer Neg Hx   . Pancreatic cancer Neg Hx   . Rectal cancer Neg Hx   . Stomach cancer Neg Hx     Social History Social History   Tobacco Use  . Smoking status: Current Every Day Smoker    Packs/day: 0.50    Years: 45.00    Pack years: 22.50    Types: Cigarettes, E-cigarettes  . Smokeless tobacco: Never Used  Substance Use Topics  . Alcohol use: No    Alcohol/week: 0.0 oz  . Drug use: No    Review of Systems  Constitutional: Negative for fever. Eyes: Negative for visual changes. ENT: Negative for sore throat. Cardiovascular: Negative for chest pain. Respiratory: She has chronic shortness of breath, and states that actually she has been doing better after what she considers bronchitis after treatment about the past 10  days. Gastrointestinal: Negative for abdominal pain, vomiting and diarrhea. Genitourinary: Negative for dysuria. Musculoskeletal: Negative for back pain. Skin: Negative for rash. Neurological: Negative for headache.  ____________________________________________   PHYSICAL EXAM:  VITAL SIGNS: ED Triage Vitals  Enc Vitals Group     BP 04/20/17 1143 (!) 111/59     Pulse Rate 04/20/17 1143 100     Resp 04/20/17 1143 18     Temp 04/20/17 1143 97.9 F (36.6 C)     Temp Source 04/20/17 1143 Oral     SpO2 04/20/17 1143 97 %     Weight 04/20/17 1223 135 lb (61.2 kg)     Height 04/20/17 1223 5\' 9"  (1.753 m)     Head Circumference --      Peak Flow --      Pain Score 04/20/17 1139 0     Pain Loc --      Pain Edu? --      Excl. in [place]? --      Constitutional: Alert and oriented. Well appearing and in no distress. HEENT   Head: Normocephalic and atraumatic.      Eyes: Conjunctivae are normal. Pupils equal and round.       Ears:         Nose: No congestion/rhinnorhea.   Mouth/Throat: Mucous membranes are moist.   Neck: No stridor. Cardiovascular/Chest: Normal rate, regular rhythm.  No murmurs, rubs, or gallops. Respiratory: Normal respiratory effort without tachypnea nor retractions. Breath sounds are clear and equal bilaterally.  Mild end expiratory wheezing. Gastrointestinal: Soft. No distention, no guarding, no rebound. Nontender.    Genitourinary/rectal:Deferred Musculoskeletal: Nontender with normal range of motion in all extremities. No joint effusions.  No lower extremity tenderness.  No edema. Neurologic:  Normal speech and language. No gross or focal neurologic deficits are appreciated. Skin:  Skin is warm, dry and intact. No rash noted. Psychiatric: Mood and affect are normal. Speech and behavior are normal. Patient exhibits appropriate insight and judgment.   ____________________________________________  LABS (pertinent positives/negatives) I, Lisa Roca,  MD the attending physician have reviewed the labs noted below.  Labs Reviewed  BASIC METABOLIC PANEL - Abnormal; Notable for the following components:      Result Value   Glucose, Bld 102 (*)    Calcium 7.9 (*)    All other components within normal limits  CBC - Abnormal; Notable for the following components:  WBC 14.6 (*)    RDW 16.5 (*)    Platelets 66 (*)    All other components within normal limits  CULTURE, BLOOD (ROUTINE X 2)  CULTURE, BLOOD (ROUTINE X 2)  TROPONIN I  LACTIC ACID, PLASMA  URINE DRUG SCREEN, QUALITATIVE ([facility] ONLY)  LACTIC ACID, PLASMA  URINALYSIS, COMPLETE ([facility]MP) WITH MICROSCOPIC    ____________________________________________    EKG I, Lisa Roca, MD, the attending physician have personally viewed and interpreted all ECGs.  97 bpm.  Atrial fibrillation.  Narrow QRS.  Normal axis.  Normal ST and T wave ____________________________________________  RADIOLOGY All Xrays were viewed by me.  Imaging interpreted by Radiologist, and I, Lisa Roca, MD the attending physician have reviewed the radiologist interpretation noted below.  Chest x-ray two-view:  IMPRESSION: Mild right lower lobe opacity, suspicious for pneumonia. __________________________________________  PROCEDURES  Procedure(s) performed: None  Critical Care performed: None   ____________________________________________  ED COURSE / ASSESSMENT AND PLAN  Pertinent labs & imaging results that were available during my care of the patient were reviewed by me and considered in my medical decision making (see chart for details).    Patient states today she had some generalized fatigue that made it difficult for her to walk, but no focal neurologic complaints.  She does not have focal neurologic deficit by history or on exam here.  Son felt like she was decreased level of consciousness, and it seems like that was reversed with Narcan.  Patient states that she has not had  hydrocodone in about 2 weeks, however her son is concerned that she is probably not telling the truth with regard to that.  Patient states that her respiratory status actually feels much better than it had been, however we discussed her chest x-ray shows likely right-sided pneumonia.  Given the fact that her blood pressures are slightly low, although she reports she thinks this is normal for her, I will give her a little bit of fluids as well as a dose of IV Levaquin.  I am trying to get blood cultures although she looks well appearing that I do not think she will need to stay overnight in the hospital.  I am awaiting urinalysis and urine drug screen with respect to other causes for her having been fatigued today, and I am waiting on a lactate.  Pending labs include urinalysis, urine drug screen, lactate, and disposition I would anticipate to be likely discharged home.  If no change in clinical status, patient may be discharged with my prepared discharge instructions.  Addended to include patient has been here for 4 hours, no recurrent altered mental status, in terms of whether or not she may have had narcotic sedation or not.  She is covered on Levaquin in case she would have urinary tract infection, but she is not having any symptoms of that.  I would go ahead and discharge her without a urine sample here.  Son is here to take her home.  DIFFERENTIAL DIAGNOSIS: Including but not limited to dehydration, electrolyte disturbance, urinary tract infection, sepsis, pneumonia, and narcotic side effect/overdose, etc.  CONSULTATIONS: None  Patient / Family / Caregiver informed of clinical course, medical decision-making process, and agree with plan.   I discussed return precautions, follow-up instructions, and discharge instructions with patient and/or family.  Discharge Instructions : You are evaluated for generalized weakness today and decreased responsiveness and are being treated for pneumonia with  antibiotic Levaquin.  You are given IV fluids for slightly low blood pressures  although you did report they have a history of low blood pressures at baseline.  Return to the emergency room immediately for any new or worsening breathing, fever, altered mental status, one-sided weakness or numbness.    ___________________________________________   FINAL CLINICAL IMPRESSION(S) / ED DIAGNOSES   Final diagnoses:  Pneumonia of right lower lobe due to infectious organism [facility])  Generalized weakness  Decreased responsiveness      ___________________________________________        Note: This dictation was prepared with Dragon dictation. Any transcriptional errors that result from this process are unintentional    Lisa Roca, MD 04/20/17 2707    Lisa Roca, MD 04/20/17 [phone]

## 2017-04-20 NOTE — ED Notes (Signed)
Pt on phone with son. Pt is adamant she did not take anything to make herself unresponsive at home.

## 2017-04-20 NOTE — ED Triage Notes (Signed)
Pt arrived via EMS from home with reports of unresponsive, family reported to EMS unable to arouse, pt having breathing difficulties.  Pt given 2mg  Narcan by EMS which helped patient with breathing. Pt has hx of afib  Pt takes restoril and xanax at night.  EMS gave neb tx for wheezing.

## 2017-04-20 NOTE — Discharge Instructions (Addendum)
You are evaluated for generalized weakness today and decreased responsiveness and are being treated for pneumonia with antibiotic Levaquin.  You are given IV fluids for slightly low blood pressures although you did report they have a history of low blood pressures at baseline.  Return to the emergency room immediately for any new or worsening breathing, fever, altered mental status, one-sided weakness or numbness.

## 2017-04-22 ENCOUNTER — Inpatient Hospital Stay: Payer: Medicare HMO

## 2017-04-22 ENCOUNTER — Inpatient Hospital Stay: Payer: Medicare HMO | Attending: Oncology | Admitting: Oncology

## 2017-04-22 VITALS — BP 134/79 | HR 96 | Temp 98.9°F | Resp 17 | Ht 69.0 in | Wt 136.8 lb

## 2017-04-22 DIAGNOSIS — I4891 Unspecified atrial fibrillation: Secondary | ICD-10-CM | POA: Insufficient documentation

## 2017-04-22 DIAGNOSIS — C7951 Secondary malignant neoplasm of bone: Secondary | ICD-10-CM | POA: Diagnosis not present

## 2017-04-22 DIAGNOSIS — C3492 Malignant neoplasm of unspecified part of left bronchus or lung: Secondary | ICD-10-CM | POA: Diagnosis not present

## 2017-04-22 DIAGNOSIS — C9110 Chronic lymphocytic leukemia of B-cell type not having achieved remission: Secondary | ICD-10-CM | POA: Diagnosis present

## 2017-04-22 DIAGNOSIS — Z85118 Personal history of other malignant neoplasm of bronchus and lung: Secondary | ICD-10-CM | POA: Insufficient documentation

## 2017-04-22 LAB — CBC WITH DIFFERENTIAL/PLATELET
Abs Granulocyte: 5.7 10*3/uL (ref 1.5–6.5)
Basophils Absolute: 0 10*3/uL (ref 0.0–0.1)
Basophils Relative: 1 %
EOS PCT: 0 %
Eosinophils Absolute: 0 10*3/uL (ref 0.0–0.5)
HCT: 35.7 % (ref 34.8–46.6)
Hemoglobin: 11.5 g/dL — ABNORMAL LOW (ref 11.6–15.9)
LYMPHS ABS: 3 10*3/uL (ref 0.9–3.3)
Lymphocytes Relative: 33 %
MCH: 28 pg (ref 25.1–34.0)
MCHC: 32.1 g/dL (ref 31.5–36.0)
MCV: 87 fL (ref 79.5–101.0)
MONO ABS: 0.4 10*3/uL (ref 0.1–0.9)
MONOS PCT: 4 %
Neutro Abs: 5.7 10*3/uL (ref 1.5–6.5)
Neutrophils Relative %: 62 %
PLATELETS: 74 10*3/uL — AB (ref 145–400)
RBC: 4.11 MIL/uL (ref 3.70–5.45)
RDW: 16.6 % — AB (ref 11.2–16.1)
WBC: 9.2 10*3/uL (ref 3.9–10.3)

## 2017-04-22 MED ORDER — ALPRAZOLAM 0.5 MG PO TABS: 0.5000 mg | ORAL_TABLET | Freq: Two times a day (BID) | ORAL | 0 refills | Status: DC | PRN

## 2017-04-22 NOTE — Progress Notes (Signed)
[place] OFFICE PROGRESS NOTE   Diagnosis: Small cell lung cancer, CLL  INTERVAL HISTORY:   Sheri Ayala returns for a scheduled visit.  She was seen in the symptom management clinic 04/04/2017 with a fever and cough.  She was treated with Bactrim for bronchitis/COPD flare. She reports the symptoms improved.  She developed generalized weakness on 04/20/2017.  She required assistance getting into her home after running errands.  Her son found her unresponsive at home and she was transported to the emergency room via EMS.  She was prescribed Narcan.  She was alert in the emergency room.  There were no focal neurologic findings.  A chest x-ray suggested the possibility of a right lung pneumonia.  She was prescribed Levaquin and discharged home.  She denies excessive use of Xanax or hydrocodone prior to the events of 04/20/2017.  Sheri Ayala has noted ataxia and difficulty with memory recall for the past several days.   Objective:  Vital signs in last 24 hours:  Blood pressure 134/79, pulse 96, temperature 98.9 F (37.2 C), temperature source Oral, resp. rate 17, height 5\' 9"  (1.753 m), weight 136 lb 12.8 oz (62.1 kg), SpO2 100 %.    HEENT: Neck without mass Resp: Lungs clear bilaterally, no respiratory distress Cardio: Irregular GI: No hepatosplenomegaly, nontender Vascular: No leg edema Neuro: Alert and oriented, the motor exam appears intact in the upper and lower extremities.  The face is symmetric.  The speech is fluent.  She walks with an unsteady gait and a slight drift to the right.  Finger to nose testing is normal.    Lab Results:  Lab Results  Component Value Date   WBC 9.2 04/22/2017   HGB 11.5 (L) 04/22/2017   HCT 35.7 04/22/2017   MCV 87.0 04/22/2017   PLT 74 (L) 04/22/2017   NEUTROABS 5.7 04/22/2017    CMP     Component Value Date/Time   NA 142 04/20/2017 1147   NA 140 04/04/2017 1241   K 4.2 04/20/2017 1147   K 4.9 04/04/2017 1241   CL 108  04/20/2017 1147   CO2 25 04/20/2017 1147   CO2 25 04/04/2017 1241   GLUCOSE 102 (H) 04/20/2017 1147   GLUCOSE 112 04/04/2017 1241   BUN 11 04/20/2017 1147   BUN 13.7 04/04/2017 1241   CREATININE 0.70 04/20/2017 1147   CREATININE 0.7 04/04/2017 1241   CALCIUM 7.9 (L) 04/20/2017 1147   CALCIUM 9.5 04/04/2017 1241   PROT 6.7 04/04/2017 1241   ALBUMIN 4.4 04/04/2017 1241   AST 22 04/04/2017 1241   ALT 20 04/04/2017 1241   ALKPHOS 122 04/04/2017 1241   BILITOT 0.91 04/04/2017 1241   GFRNONAA >60 04/20/2017 1147   GFRAA >60 04/20/2017 1147     Imaging:  Dg Chest 2 View  Result Date: 04/20/2017 CLINICAL DATA:  Unresponsive, wheezing EXAM: CHEST  2 VIEW COMPARISON:  04/11/2017 FINDINGS: Mild right lower lobe opacity, suspicious for pneumonia. Mild left basilar opacity, likely atelectasis. No pleural effusion or pneumothorax. The heart is normal in size. Visualized osseous structures are within normal limits. IMPRESSION: Mild right lower lobe opacity, suspicious for pneumonia. Electronically Signed   By: Julian Hy M.D.   On: 04/20/2017 12:23    Medications: I have reviewed the patient's current medications.   Assessment/Plan:  1. 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 COPD 3. 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 Atrial fibrillation.Cardizem discontinued due to a skin rash. Follow-up with cardiology. 5. Leukocytosis secondary to CLL and steroids 6. Rash 07/25/2016-appearedto have a drug rash, could be related to antibiotics given earlier duringhospital admission, or contrast for the brain MRI; recurrent rash 08/03/2016--cardizem discontinued. Rash resolved 08/15/2016. 7. Oral candidiasis-Diflucan added 07/25/2016, resolved   Disposition: Sheri Ayala has CLL and small cell lung cancer.  The etiology of the episode of decreased responsiveness 04/20/2017 is unclear.  I am concerned she may have brain metastases or a CVA based on her history and the examination today.  She will be referred for a CT of the brain this week.  She will complete the course of Levaquin prescribed by the emergency room physician, though she does not have symptoms of pneumonia at present.  I reviewed the chest x-ray images from 04/20/2017 and there is a subtle density in the right lower lobe.  Sheri Ayala will return for an office visit in approximately 2 weeks.  We will see her sooner as needed and depending on the brain CT findings.  25 minutes were spent with the patient today.  The majority of the time was used for counseling and coordination of care.  Betsy Coder, MD  04/22/2017  4:41 PM

## 2017-04-23 ENCOUNTER — Telehealth: Payer: Self-pay
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
## 2017-04-24 ENCOUNTER — Telehealth: Payer: Self-pay

## 2017-04-24 ENCOUNTER — Telehealth: Payer: Self-pay | Admitting: Oncology

## 2017-04-24 NOTE — Telephone Encounter (Signed)
Spoke with [facility] regarding patient call regarding appointment for CT scan. Spoke with Carry in radiology and she will call Sheri Ayala and get the appointment scheduled.

## 2017-04-24 NOTE — Telephone Encounter (Signed)
Scheduled appt per 1/7 los - Patient is aware of appt date and time . [facility] to contact patient with ct scan . Patient aware that they may have to call [facility] - gave patient their number.

## 2017-04-25 LAB — CULTURE, BLOOD (ROUTINE X 2)
CULTURE: NO GROWTH
Culture: NO GROWTH
SPECIAL REQUESTS: ADEQUATE
Special Requests: ADEQUATE

## 2017-04-26 NOTE — Progress Notes (Signed)
ED CULTURE REPORT  Sheri Ayala is a 67 yo female seen in the ED on 04/20/2017 with SOB. During ED visit, chest x-ray revealed suspected PNA and blood cultures were obtained. Patient was discharged with levofloxacin 750mg  daily x 7 days. Patient has a past medical history of lunger cancer, CLL, and recent PNA. Blood cultures resulted on 04/26/2017 showing no growth. I discussed the case with ED MD Dr. Quentin Cornwall who stated that no further action was needed as patient is being followed by her oncologist.   Results for orders placed or performed during the hospital encounter of 04/20/17  Culture, blood (routine x 2)     Status: None   Collection Time: 04/20/17  2:22 PM  Result Value Ref Range Status   Specimen Description BLOOD LEFT HAND  Final   Special Requests   Final    BOTTLES DRAWN AEROBIC AND ANAEROBIC Blood Culture adequate volume   Culture   Final    NO GROWTH 5 DAYS Performed at [facility], [address] [place], [place] [address]    Report Status 04/25/2017 FINAL  Final  Culture, blood (routine x 2)     Status: None   Collection Time: 04/20/17  2:22 PM  Result Value Ref Range Status   Specimen Description BLOOD LEFT HAND  Final   Special Requests   Final    BOTTLES DRAWN AEROBIC AND ANAEROBIC Blood Culture adequate volume   Culture   Final    NO GROWTH 5 DAYS Performed at [facility], [address] [place], [place] [address]    Report Status 04/25/2017 FINAL  Final   Lendon Ka, PharmD Pharmacy Resident

## 2017-05-02 ENCOUNTER — Ambulatory Visit
Admission: RE | Admit: 2017-05-02 | Discharge: 2017-05-02 | Disposition: A | Discharge status: Home / Self Care | Payer: Medicare HMO | Source: Ambulatory Visit | Attending: Oncology | Admitting: Oncology

## 2017-05-02 DIAGNOSIS — R9082 White matter disease, unspecified: Secondary | ICD-10-CM | POA: Insufficient documentation

## 2017-05-02 DIAGNOSIS — C3492 Malignant neoplasm of unspecified part of left bronchus or lung: Secondary | ICD-10-CM

## 2017-05-02 MED ORDER — IOPAMIDOL (ISOVUE-300) INJECTION 61%
75.0000 mL | Freq: Once | INTRAVENOUS | Status: AC | PRN
  Administered 2017-05-02: 75 mL via INTRAVENOUS

## 2017-05-03 ENCOUNTER — Telehealth: Payer: Self-pay

## 2017-05-03 NOTE — Telephone Encounter (Addendum)
Pt verbalized understanding   ----- Message from Sheri Pier, MD sent at 05/03/2017  5:02 PM EST ----- Please call patient, brain CT is negative for cancer

## 2017-05-06 NOTE — Progress Notes (Signed)
Attempted to call, unable to leave message.

## 2017-05-06 NOTE — Progress Notes (Signed)
Attempted to call, unable to leave message.  Cyndia Bent RN

## 2017-05-08 ENCOUNTER — Inpatient Hospital Stay (HOSPITAL_BASED_OUTPATIENT_CLINIC_OR_DEPARTMENT_OTHER): Payer: Medicare HMO | Admitting: Nurse Practitioner

## 2017-05-08 ENCOUNTER — Telehealth: Payer: Self-pay | Admitting: Nurse Practitioner

## 2017-05-08 ENCOUNTER — Inpatient Hospital Stay: Payer: Medicare HMO

## 2017-05-08 VITALS — BP 144/84 | HR 96 | Temp 97.9°F | Resp 18 | Wt 137.8 lb

## 2017-05-08 DIAGNOSIS — C3492 Malignant neoplasm of unspecified part of left bronchus or lung: Secondary | ICD-10-CM

## 2017-05-08 DIAGNOSIS — Z85118 Personal history of other malignant neoplasm of bronchus and lung: Secondary | ICD-10-CM

## 2017-05-08 DIAGNOSIS — I4891 Unspecified atrial fibrillation: Secondary | ICD-10-CM | POA: Diagnosis not present

## 2017-05-08 DIAGNOSIS — C9110 Chronic lymphocytic leukemia of B-cell type not having achieved remission: Secondary | ICD-10-CM | POA: Diagnosis not present

## 2017-05-08 LAB — CBC WITH DIFFERENTIAL (CANCER CENTER ONLY)
Basophils Absolute: 0 10*3/uL (ref 0.0–0.1)
Basophils Relative: 0 %
EOS PCT: 0 %
Eosinophils Absolute: 0 10*3/uL (ref 0.0–0.5)
HCT: 41.4 % (ref 34.8–46.6)
Hemoglobin: 12.6 g/dL (ref 11.6–15.9)
LYMPHS ABS: 7.3 10*3/uL — AB (ref 0.9–3.3)
LYMPHS PCT: 62 %
MCH: 27.4 pg (ref 25.1–34.0)
MCHC: 30.4 g/dL — ABNORMAL LOW (ref 31.5–36.0)
MCV: 90 fL (ref 79.5–101.0)
MONO ABS: 0.4 10*3/uL (ref 0.1–0.9)
Monocytes Relative: 3 %
Neutro Abs: 4.2 10*3/uL (ref 1.5–6.5)
Neutrophils Relative %: 35 %
PLATELETS: 152 10*3/uL (ref 145–400)
RBC: 4.6 MIL/uL (ref 3.70–5.45)
RDW: 15.9 % (ref 11.2–16.1)
WBC: 11.9 10*3/uL — AB (ref 3.9–10.3)

## 2017-05-08 MED ORDER — HYDROCODONE-ACETAMINOPHEN 5-325 MG PO TABS: 1.0000 | ORAL_TABLET | Freq: Four times a day (QID) | ORAL | 0 refills | Status: DC | PRN

## 2017-05-08 NOTE — Progress Notes (Addendum)
[facility] OFFICE PROGRESS NOTE   Diagnosis: Small cell lung cancer, CLL  INTERVAL HISTORY:   Sheri Ayala returns as scheduled.  She reports she has been in bed off and on for the past 3 days.  She is having "migraines".  Her energy level is poor.  No fevers or chills.  She is more short of breath.  No falls.  No nausea, vomiting or diarrhea.  She takes hydrocodone for right shoulder pain and more recently migraines.  Objective:  Vital signs in last 24 hours:  Blood pressure (!) 144/84, pulse 96, temperature 97.9 F (36.6 C), temperature source Oral, resp. rate 18, weight 137 lb 12.8 oz (62.5 kg), SpO2 91 %.    HEENT: No thrush or ulcers. Resp: Distant breath sounds. Cardio: Irregular. GI: Abdomen soft and nontender.  No hepatomegaly. Vascular: No leg edema. Neuro: Alert and oriented.  Follows commands.   Lab Results:  Lab Results  Component Value Date   WBC 11.9 (H) 05/08/2017   HGB 11.5 (L) 04/22/2017   HCT 41.4 05/08/2017   MCV 90.0 05/08/2017   PLT 152 05/08/2017   NEUTROABS 4.2 05/08/2017    Imaging:  No results found.  Medications: I have reviewed the patient's current medications.  Assessment/Plan: 1. 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 COPD 3. 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  No definite new or progressive metastatic disease in the chest.  Tiny left lower lobe pulmonary nodules stable.  Solitary new 6 mm subpleural nodular opacity medial basilar right lower lobe favored to be minimal focal scarring or atelectasis.  Continued reduction of mild mediastinal adenopathy.  Patchy sclerotic lesions throughout the sternum and thoracic spine, decreased in sclerosis, not appreciably changed in size or number. 4. Atrial fibrillation. Cardizem discontinued due to a skin rash. Follow-up with cardiology. 5. Leukocytosis secondary to CLL and steroids 6. Rash 07/25/2016-appearedto have a drug rash, could be related to antibiotics given earlier duringhospital admission, or contrast for the brain MRI; recurrent rash 08/03/2016--cardizem discontinued. Rash resolved 08/15/2016. 7. Oral candidiasis-Diflucan added 07/25/2016, resolved 8.  Brain CT 05/02/2017-no metastatic disease or acute intracranial abnormality.    Disposition: Sheri Ayala appears stable.  She remains in clinical remission from the small cell lung cancer.   She continues to feel weak and fatigued, etiology unclear.  The recent brain CT was negative.  We made a referral to cardiology for follow-up of atrial fibrillation, question anticoagulation.  We reviewed her medication list with her today.  We prescribe hydrocodone for right shoulder pain felt to be related to cancer.  She also takes Xanax and Restoril.  She understands that she should not take these medications together due to the possibility of serious adverse effects.  She will return for labs and a follow-up visit in 3 months.  She will contact the  office in the interim with any problems.  Patient  seen with Dr. Benay Spice.  Ned Card ANP/GNP-BC   05/08/2017  3:57 PM  This was a shared visit with Ned Card.  Sheri Ayala is in clinical remission from lung cancer.  The brain CT on 05/02/2017 showed no evidence of metastatic disease.  She continues to have atrial fibrillation.  The platelet count is now in the normal range.  We recommended she see cardiology to resume anticoagulation therapy.  We discussed her use of benzodiazepines and narcotics.  We strongly encouraged her not to take these medications together.  Julieanne Manson, MD

## 2017-05-08 NOTE — Telephone Encounter (Signed)
Scheduled appt per 1/23 los - Sent reminder letter in the mail with appt date and time . Lab and f/u in 3 months.

## 2017-05-15 ENCOUNTER — Telehealth: Payer: Self-pay | Admitting: *Deleted

## 2017-05-15 NOTE — Telephone Encounter (Signed)
Returned pt call. Pt reports "severe headaches" that feel like "someone is hammering a nail into my head". Per the pt, "this happens everyday". Pt states that she "passed out for 4 hours from 3am until 7am and I was here by myself". Spoke with Dr. Benay Spice and Ned Card. NP. Pt to seek care at nearest urgent care, ED, or with her PCP. Pt voiced understanding and knows to call with any further questions or concerns.

## 2017-05-15 NOTE — Telephone Encounter (Signed)
Instructed to go to the ED after long discussion of "off and on headaches last three weeks getting progressively worse, so severe I cry.  Brain scan normal but I need to find out what's going on.  Usiong  Hydrocodone, ice packs but it feels like a nail is being hammered on the right side of my head and right eye.  Awakened past two nights.  Ice used at 3:00 am.  At 7:00 I woke up curled in a ball with melted ice.  Don't know what happened for those four hours.  Son was sleeping so I do not know if I was shaking.  It happenned again before I called.  No B/P or vision problems.  2017-05-07 I died for a minute and a half after my B/P bottomed out.  Have not heard anything about the referral made to be seen about my blood.  This all started last year.  I cancelled appointment when in atrial fib.  My son does not drive, nor do I.  If it happens again, I'll call 911."  Expressed importance and need for higher level of care with reported symptoms.

## 2017-05-24 ENCOUNTER — Emergency Department: Payer: Medicare HMO

## 2017-05-24 ENCOUNTER — Inpatient Hospital Stay
Admission: EM | Admit: 2017-05-24 | Discharge: 2017-05-27 | DRG: 054 | Disposition: A | Discharge status: Home / Self Care | Payer: Medicare HMO | Attending: Internal Medicine | Admitting: Internal Medicine

## 2017-05-24 ENCOUNTER — Inpatient Hospital Stay: Payer: Medicare HMO

## 2017-05-24 ENCOUNTER — Other Ambulatory Visit: Payer: Self-pay

## 2017-05-24 DIAGNOSIS — J9621 Acute and chronic respiratory failure with hypoxia: Secondary | ICD-10-CM | POA: Diagnosis present

## 2017-05-24 DIAGNOSIS — R590 Localized enlarged lymph nodes: Secondary | ICD-10-CM | POA: Diagnosis present

## 2017-05-24 DIAGNOSIS — J189 Pneumonia, unspecified organism: Secondary | ICD-10-CM

## 2017-05-24 DIAGNOSIS — K219 Gastro-esophageal reflux disease without esophagitis: Secondary | ICD-10-CM | POA: Diagnosis present

## 2017-05-24 DIAGNOSIS — I671 Cerebral aneurysm, nonruptured: Secondary | ICD-10-CM | POA: Diagnosis present

## 2017-05-24 DIAGNOSIS — C7951 Secondary malignant neoplasm of bone: Secondary | ICD-10-CM | POA: Diagnosis not present

## 2017-05-24 DIAGNOSIS — Z91018 Allergy to other foods: Secondary | ICD-10-CM

## 2017-05-24 DIAGNOSIS — Z8661 Personal history of infections of the central nervous system: Secondary | ICD-10-CM

## 2017-05-24 DIAGNOSIS — H532 Diplopia: Secondary | ICD-10-CM | POA: Diagnosis present

## 2017-05-24 DIAGNOSIS — R29700 NIHSS score 0: Secondary | ICD-10-CM | POA: Diagnosis present

## 2017-05-24 DIAGNOSIS — Z856 Personal history of leukemia: Secondary | ICD-10-CM | POA: Diagnosis not present

## 2017-05-24 DIAGNOSIS — A419 Sepsis, unspecified organism: Secondary | ICD-10-CM | POA: Diagnosis not present

## 2017-05-24 DIAGNOSIS — J181 Lobar pneumonia, unspecified organism: Secondary | ICD-10-CM | POA: Diagnosis present

## 2017-05-24 DIAGNOSIS — F329 Major depressive disorder, single episode, unspecified: Secondary | ICD-10-CM | POA: Diagnosis present

## 2017-05-24 DIAGNOSIS — Z9981 Dependence on supplemental oxygen: Secondary | ICD-10-CM | POA: Diagnosis not present

## 2017-05-24 DIAGNOSIS — I4891 Unspecified atrial fibrillation: Secondary | ICD-10-CM | POA: Diagnosis present

## 2017-05-24 DIAGNOSIS — I081 Rheumatic disorders of both mitral and tricuspid valves: Secondary | ICD-10-CM | POA: Diagnosis present

## 2017-05-24 DIAGNOSIS — J9620 Acute and chronic respiratory failure, unspecified whether with hypoxia or hypercapnia: Secondary | ICD-10-CM | POA: Diagnosis not present

## 2017-05-24 DIAGNOSIS — Z87891 Personal history of nicotine dependence: Secondary | ICD-10-CM

## 2017-05-24 DIAGNOSIS — Z880 Allergy status to penicillin: Secondary | ICD-10-CM | POA: Diagnosis not present

## 2017-05-24 DIAGNOSIS — Z8673 Personal history of transient ischemic attack (TIA), and cerebral infarction without residual deficits: Secondary | ICD-10-CM | POA: Diagnosis not present

## 2017-05-24 DIAGNOSIS — J321 Chronic frontal sinusitis: Secondary | ICD-10-CM | POA: Diagnosis present

## 2017-05-24 DIAGNOSIS — Z9221 Personal history of antineoplastic chemotherapy: Secondary | ICD-10-CM | POA: Diagnosis not present

## 2017-05-24 DIAGNOSIS — C7931 Secondary malignant neoplasm of brain: Secondary | ICD-10-CM | POA: Diagnosis not present

## 2017-05-24 DIAGNOSIS — C3490 Malignant neoplasm of unspecified part of unspecified bronchus or lung: Secondary | ICD-10-CM | POA: Diagnosis not present

## 2017-05-24 DIAGNOSIS — Z22322 Carrier or suspected carrier of Methicillin resistant Staphylococcus aureus: Secondary | ICD-10-CM

## 2017-05-24 DIAGNOSIS — F419 Anxiety disorder, unspecified: Secondary | ICD-10-CM | POA: Diagnosis present

## 2017-05-24 DIAGNOSIS — J440 Chronic obstructive pulmonary disease with acute lower respiratory infection: Secondary | ICD-10-CM | POA: Diagnosis present

## 2017-05-24 DIAGNOSIS — R519 Headache, unspecified: Secondary | ICD-10-CM

## 2017-05-24 DIAGNOSIS — Z79899 Other long term (current) drug therapy: Secondary | ICD-10-CM

## 2017-05-24 DIAGNOSIS — J439 Emphysema, unspecified: Secondary | ICD-10-CM | POA: Diagnosis present

## 2017-05-24 DIAGNOSIS — Z66 Do not resuscitate: Secondary | ICD-10-CM | POA: Diagnosis present

## 2017-05-24 DIAGNOSIS — Z7951 Long term (current) use of inhaled steroids: Secondary | ICD-10-CM

## 2017-05-24 DIAGNOSIS — R51 Headache: Secondary | ICD-10-CM

## 2017-05-24 DIAGNOSIS — Z888 Allergy status to other drugs, medicaments and biological substances status: Secondary | ICD-10-CM

## 2017-05-24 HISTORY — DX: Cerebral aneurysm, nonruptured: I67.1

## 2017-05-24 LAB — CBC WITH DIFFERENTIAL/PLATELET
BASOS ABS: 0 10*3/uL (ref 0–0.1)
Basophils Relative: 0 %
EOS ABS: 0 10*3/uL (ref 0–0.7)
Eosinophils Relative: 0 %
HCT: 43.8 % (ref 35.0–47.0)
HEMOGLOBIN: 13.5 g/dL (ref 12.0–16.0)
LYMPHS PCT: 58 %
Lymphs Abs: 11.3 10*3/uL — ABNORMAL HIGH (ref 1.0–3.6)
MCH: 25.9 pg — ABNORMAL LOW (ref 26.0–34.0)
MCHC: 30.8 g/dL — ABNORMAL LOW (ref 32.0–36.0)
MCV: 84.1 fL (ref 80.0–100.0)
MONOS PCT: 2 %
Monocytes Absolute: 0.4 10*3/uL (ref 0.2–0.9)
NEUTROS ABS: 7.8 10*3/uL — AB (ref 1.4–6.5)
NEUTROS PCT: 40 %
Platelets: 173 10*3/uL (ref 150–440)
RBC: 5.21 MIL/uL — AB (ref 3.80–5.20)
RDW: 17.8 % — ABNORMAL HIGH (ref 11.5–14.5)
WBC: 19.5 10*3/uL — AB (ref 3.6–11.0)

## 2017-05-24 LAB — COMPREHENSIVE METABOLIC PANEL
ALBUMIN: 3.6 g/dL (ref 3.5–5.0)
ALK PHOS: 91 U/L (ref 38–126)
ALT: 9 U/L — AB (ref 14–54)
AST: 19 U/L (ref 15–41)
Anion gap: 12 (ref 5–15)
BUN: 17 mg/dL (ref 6–20)
CALCIUM: 8.6 mg/dL — AB (ref 8.9–10.3)
CO2: 22 mmol/L (ref 22–32)
CREATININE: 0.67 mg/dL (ref 0.44–1.00)
Chloride: 108 mmol/L (ref 101–111)
GFR calc Af Amer: >60 mL/min (ref >60)
GFR calc non Af Amer: >60 mL/min (ref >60)
GLUCOSE: 102 mg/dL — AB (ref 65–99)
Potassium: 4 mmol/L (ref 3.5–5.1)
SODIUM: 142 mmol/L (ref 135–145)
Total Bilirubin: 0.4 mg/dL (ref 0.3–1.2)
Total Protein: 6.3 g/dL — ABNORMAL LOW (ref 6.5–8.1)

## 2017-05-24 LAB — URINALYSIS, COMPLETE (UACMP) WITH MICROSCOPIC
BACTERIA UA: NONE SEEN
BILIRUBIN URINE: NEGATIVE
Glucose, UA: NEGATIVE mg/dL
Hgb urine dipstick: NEGATIVE
KETONES UR: NEGATIVE mg/dL
Leukocytes, UA: NEGATIVE
Nitrite: NEGATIVE
PROTEIN: NEGATIVE mg/dL
Specific Gravity, Urine: 1.035 — ABNORMAL HIGH (ref 1.005–1.030)
pH: 5 (ref 5.0–8.0)

## 2017-05-24 LAB — INFLUENZA PANEL BY PCR (TYPE A & B)
INFLAPCR: NEGATIVE
INFLBPCR: NEGATIVE

## 2017-05-24 LAB — CBC
HCT: 41.2 % (ref 35.0–47.0)
HEMOGLOBIN: 13 g/dL (ref 12.0–16.0)
MCH: 26.5 pg (ref 26.0–34.0)
MCHC: 31.5 g/dL — AB (ref 32.0–36.0)
MCV: 84.1 fL (ref 80.0–100.0)
Platelets: 148 10*3/uL — ABNORMAL LOW (ref 150–440)
RBC: 4.9 MIL/uL (ref 3.80–5.20)
RDW: 17.6 % — ABNORMAL HIGH (ref 11.5–14.5)
WBC: 12.3 10*3/uL — ABNORMAL HIGH (ref 3.6–11.0)

## 2017-05-24 LAB — CREATININE, SERUM
CREATININE: 0.76 mg/dL (ref 0.44–1.00)
GFR calc Af Amer: >60 mL/min (ref >60)

## 2017-05-24 LAB — PROTIME-INR
INR: 1.15
Prothrombin Time: 14.6 seconds (ref 11.4–15.2)

## 2017-05-24 LAB — TROPONIN I: Troponin I: <0.03 ng/mL (ref <0.03)

## 2017-05-24 LAB — PROCALCITONIN: Procalcitonin: <0.1 ng/mL

## 2017-05-24 LAB — TSH: TSH: 1.346 u[IU]/mL (ref 0.350–4.500)

## 2017-05-24 LAB — BRAIN NATRIURETIC PEPTIDE: B NATRIURETIC PEPTIDE 5: 227 pg/mL — AB (ref 0.0–100.0)

## 2017-05-24 LAB — LACTIC ACID, PLASMA: LACTIC ACID, VENOUS: 0.7 mmol/L (ref 0.5–1.9)

## 2017-05-24 LAB — LIPASE, BLOOD: Lipase: 24 U/L (ref 11–51)

## 2017-05-24 MED ORDER — IPRATROPIUM-ALBUTEROL 0.5-2.5 (3) MG/3ML IN SOLN
3.0000 mL | Freq: Once | RESPIRATORY_TRACT | Status: AC
  Administered 2017-05-24: 3 mL via RESPIRATORY_TRACT
  Filled 2017-05-24: qty 3

## 2017-05-24 MED ORDER — SODIUM CHLORIDE 0.9 % IV BOLUS (SEPSIS)
1000.0000 mL | Freq: Once | INTRAVENOUS | Status: AC
  Administered 2017-05-24: 1000 mL via INTRAVENOUS

## 2017-05-24 MED ORDER — DEXAMETHASONE 4 MG PO TABS
4.0000 mg | ORAL_TABLET | Freq: Two times a day (BID) | ORAL | Status: DC
  Administered 2017-05-24 – 2017-05-27 (×6): 4 mg via ORAL
  Filled 2017-05-24 (×6): qty 1

## 2017-05-24 MED ORDER — KETOROLAC TROMETHAMINE 30 MG/ML IJ SOLN
INTRAMUSCULAR | Status: AC
  Filled 2017-05-24: qty 1

## 2017-05-24 MED ORDER — IOPAMIDOL (ISOVUE-300) INJECTION 61%
75.0000 mL | Freq: Once | INTRAVENOUS | Status: AC | PRN
  Administered 2017-05-24: 75 mL via INTRAVENOUS

## 2017-05-24 MED ORDER — VANCOMYCIN HCL IN DEXTROSE 1-5 GM/200ML-% IV SOLN
1000.0000 mg | Freq: Once | INTRAVENOUS | Status: AC
  Administered 2017-05-24: 1000 mg via INTRAVENOUS
  Filled 2017-05-24: qty 200

## 2017-05-24 MED ORDER — ALBUTEROL SULFATE (2.5 MG/3ML) 0.083% IN NEBU: 2.5000 mg | INHALATION_SOLUTION | RESPIRATORY_TRACT | Status: DC | PRN

## 2017-05-24 MED ORDER — DIPHENHYDRAMINE HCL 50 MG/ML IJ SOLN
INTRAMUSCULAR | Status: AC
  Filled 2017-05-24: qty 1

## 2017-05-24 MED ORDER — LEVOFLOXACIN IN D5W 750 MG/150ML IV SOLN
750.0000 mg | INTRAVENOUS | Status: DC
  Administered 2017-05-25 – 2017-05-26 (×2): 750 mg via INTRAVENOUS
  Filled 2017-05-24 (×3): qty 150

## 2017-05-24 MED ORDER — ENSURE ENLIVE PO LIQD
237.0000 mL | Freq: Two times a day (BID) | ORAL | Status: DC
  Administered 2017-05-24 – 2017-05-27 (×6): 237 mL via ORAL

## 2017-05-24 MED ORDER — TOPIRAMATE 25 MG PO TABS
50.0000 mg | ORAL_TABLET | Freq: Every day | ORAL | Status: DC
  Administered 2017-05-24 – 2017-05-27 (×4): 50 mg via ORAL
  Filled 2017-05-24 (×4): qty 2

## 2017-05-24 MED ORDER — MAGNESIUM SULFATE 2 GM/50ML IV SOLN
2.0000 g | Freq: Once | INTRAVENOUS | Status: AC
  Administered 2017-05-24: 2 g via INTRAVENOUS
  Filled 2017-05-24: qty 50

## 2017-05-24 MED ORDER — MAGNESIUM SULFATE 2 GM/50ML IV SOLN
INTRAVENOUS | Status: AC
  Filled 2017-05-24: qty 50

## 2017-05-24 MED ORDER — SODIUM CHLORIDE 0.9 % IV BOLUS (SEPSIS)
1000.0000 mL | INTRAVENOUS | Status: AC
  Administered 2017-05-24: 1000 mL via INTRAVENOUS

## 2017-05-24 MED ORDER — MOMETASONE FURO-FORMOTEROL FUM 200-5 MCG/ACT IN AERO
2.0000 | INHALATION_SPRAY | Freq: Two times a day (BID) | RESPIRATORY_TRACT | Status: DC
  Administered 2017-05-24 – 2017-05-27 (×6): 2 via RESPIRATORY_TRACT
  Filled 2017-05-24: qty 8.8

## 2017-05-24 MED ORDER — KETOROLAC TROMETHAMINE 30 MG/ML IJ SOLN
15.0000 mg | Freq: Once | INTRAMUSCULAR | Status: AC
  Administered 2017-05-24: 15 mg via INTRAVENOUS
  Filled 2017-05-24: qty 1

## 2017-05-24 MED ORDER — SUMATRIPTAN SUCCINATE 50 MG PO TABS
50.0000 mg | ORAL_TABLET | ORAL | Status: DC | PRN
  Administered 2017-05-24: 50 mg via ORAL
  Filled 2017-05-24 (×2): qty 1

## 2017-05-24 MED ORDER — SERTRALINE HCL 50 MG PO TABS
25.0000 mg | ORAL_TABLET | Freq: Every day | ORAL | Status: DC
  Administered 2017-05-24 – 2017-05-26 (×3): 25 mg via ORAL
  Filled 2017-05-24 (×3): qty 1

## 2017-05-24 MED ORDER — DOCUSATE SODIUM 100 MG PO CAPS
100.0000 mg | ORAL_CAPSULE | Freq: Two times a day (BID) | ORAL | Status: DC | PRN
Start: 2017-05-24 — End: 2017-05-27

## 2017-05-24 MED ORDER — TEMAZEPAM 15 MG PO CAPS
30.0000 mg | ORAL_CAPSULE | Freq: Every day | ORAL | Status: DC
  Administered 2017-05-24 – 2017-05-26 (×3): 30 mg via ORAL
  Filled 2017-05-24 (×3): qty 2

## 2017-05-24 MED ORDER — METOCLOPRAMIDE HCL 5 MG/ML IJ SOLN
INTRAMUSCULAR | Status: AC
  Filled 2017-05-24: qty 2

## 2017-05-24 MED ORDER — HYDROCODONE-ACETAMINOPHEN 5-325 MG PO TABS
1.0000 | ORAL_TABLET | Freq: Four times a day (QID) | ORAL | Status: DC | PRN
  Administered 2017-05-24 – 2017-05-27 (×8): 1 via ORAL
  Filled 2017-05-24 (×8): qty 1

## 2017-05-24 MED ORDER — VANCOMYCIN HCL IN DEXTROSE 750-5 MG/150ML-% IV SOLN
750.0000 mg | Freq: Two times a day (BID) | INTRAVENOUS | Status: DC
Start: 2017-05-24 — End: 2017-05-26
  Administered 2017-05-24 – 2017-05-26 (×4): 750 mg via INTRAVENOUS
  Filled 2017-05-24 (×4): qty 150

## 2017-05-24 MED ORDER — HEPARIN SODIUM (PORCINE) 5000 UNIT/ML IJ SOLN
5000.0000 [IU] | Freq: Three times a day (TID) | INTRAMUSCULAR | Status: DC
  Administered 2017-05-24 – 2017-05-27 (×9): 5000 [IU] via SUBCUTANEOUS
  Filled 2017-05-24 (×9): qty 1

## 2017-05-24 MED ORDER — GADOBENATE DIMEGLUMINE 529 MG/ML IV SOLN
15.0000 mL | Freq: Once | INTRAVENOUS | Status: AC | PRN
Start: 2017-05-24 — End: 2017-05-24
  Administered 2017-05-24: 12 mL via INTRAVENOUS

## 2017-05-24 MED ORDER — METOCLOPRAMIDE HCL 5 MG/ML IJ SOLN
10.0000 mg | INTRAMUSCULAR | Status: AC
Start: 2017-05-24 — End: 2017-05-24
  Administered 2017-05-24: 10 mg via INTRAVENOUS
  Filled 2017-05-24: qty 2

## 2017-05-24 MED ORDER — DIPHENHYDRAMINE HCL 50 MG/ML IJ SOLN
12.5000 mg | INTRAMUSCULAR | Status: AC
  Administered 2017-05-24: 12.5 mg via INTRAVENOUS
  Filled 2017-05-24: qty 1

## 2017-05-24 MED ORDER — LEVOFLOXACIN IN D5W 750 MG/150ML IV SOLN
750.0000 mg | Freq: Once | INTRAVENOUS | Status: AC
  Administered 2017-05-24: 750 mg via INTRAVENOUS
  Filled 2017-05-24: qty 150

## 2017-05-24 MED ORDER — PANTOPRAZOLE SODIUM 40 MG PO TBEC
40.0000 mg | DELAYED_RELEASE_TABLET | Freq: Every day | ORAL | Status: DC
  Administered 2017-05-24 – 2017-05-27 (×4): 40 mg via ORAL
  Filled 2017-05-24 (×4): qty 1

## 2017-05-24 MED ORDER — ALPRAZOLAM 0.5 MG PO TABS
0.5000 mg | ORAL_TABLET | Freq: Two times a day (BID) | ORAL | Status: DC | PRN
  Administered 2017-05-24 – 2017-05-27 (×6): 0.5 mg via ORAL
  Filled 2017-05-24 (×6): qty 1

## 2017-05-24 NOTE — ED Notes (Signed)
Patient transported to MRI 

## 2017-05-24 NOTE — Progress Notes (Signed)
CODE SEPSIS - PHARMACY COMMUNICATION  **Broad Spectrum Antibiotics should be administered within 1 hour of Sepsis diagnosis**  Time Code Sepsis Called/Page Received: 5916   Antibiotics Ordered: Levofloxacin/Vancomycin  Time of 1st antibiotic administration: Vancomycin 0801  Additional action taken by pharmacy:   If necessary, Name of Provider/Nurse Contacted: none    Sheri Ayala [facility] 05/24/2017  8:05 AM

## 2017-05-24 NOTE — ED Notes (Signed)
ED Provider at bedside. 

## 2017-05-24 NOTE — ED Notes (Signed)
RN unable to take report at this time 

## 2017-05-24 NOTE — ED Triage Notes (Signed)
Per EMS, pt reports "having a headache for 1 week." Pt denies hx of migraines or headaches. Pt states she saw her PCP who prescribed topamax for the headaches however pt states no relief with the medication. Pt reports hx of lung cancer and leukemia, last treatment in August. Pt tachycardic on arrival at 132. Pt placed on 2L due to oxygen saturation between 88%-90%, pt states she wears oxygen at home as needed. Pt A&O and in NAD at this time.

## 2017-05-24 NOTE — ED Notes (Signed)
CODE  SEPSIS  CALLED  TO  CARELINK 

## 2017-05-24 NOTE — ED Notes (Signed)
Pt given [place] tray and water

## 2017-05-24 NOTE — Progress Notes (Signed)
ANTIBIOTIC CONSULT NOTE - INITIAL  Pharmacy Consult for levofloxacin and vancomycin Indication: PNA/CAP  Allergies  Allergen Reactions  . Eliquis [Apixaban] Rash    Hives, and difficulty swallowing  . Cardizem Cd [Diltiazem Hcl Er Beads] Rash  . Citrus Other (See Comments)    Burning in stomach   . Penicillins Hives, Nausea And Vomiting, Swelling and Other (See Comments)    Patient reports tolerating ampicillin.  Has patient had a PCN reaction causing immediate rash, facial/tongue/throat swelling, SOB or lightheadedness with hypotension: Yes Has patient had a PCN reaction causing severe rash involving mucus membranes or skin necrosis: unknown Has patient had a PCN reaction that required hospitalization No Has patient had a PCN reaction occurring within the last 10 years: No If all of the above answers are "NO", then may proceed with Cephalosporin use.     Patient Measurements: Height: 5' 9.5" (176.5 cm) Weight: 130 lb (59 kg) IBW/kg (Calculated) : 67.35 Adjusted Body Weight:   Vital Signs: Temp: 98.1 F (36.7 C) (02/08 0609) Temp Source: Oral (02/08 0609) BP: 121/90 (02/08 1315) Pulse Rate: 106 (02/08 1315) Intake/Output from previous day: No intake/output data recorded. Intake/Output from this shift: Total I/O In: 1000 [IV Piggyback:1000] Out: -   Labs: Recent Labs    05/24/17 0621  WBC 19.5*  HGB 13.5  PLT 173  CREATININE 0.67   Estimated Creatinine Clearance: 63.6 mL/min (by C-G formula based on SCr of 0.67 mg/dL). No results for input(s): VANCOTROUGH, VANCOPEAK, VANCORANDOM, GENTTROUGH, GENTPEAK, GENTRANDOM, TOBRATROUGH, TOBRAPEAK, TOBRARND, AMIKACINPEAK, AMIKACINTROU, AMIKACIN in the last 72 hours.   Microbiology: No results found for this or any previous visit (from the past 720 hour(s)).  Medical History: Past Medical History:  Diagnosis Date  . Allergy   . 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 Anxiety   . Asthma   .  Atrial fibrillation ([place]) 04/2016  . Bronchospasm   . Cerebrovascular accident (CVA) ([place]) 08/28/2015  . Chronic lymphocytic leukemia ([place]) 2004  . COPD (chronic obstructive pulmonary disease) ([place])   . Depression   . Emphysema of lung ([place])   . Frontal sinusitis November 2012  . H/O viral encephalitis   . Headache(784.0)   . Heart murmur   . Mitral prolapse 11/24/2015  . Mitral regurgitation 11/24/2015  . Olecranon bursitis of left elbow November 2012.   in past  . Pleurisy   . Pneumonia    13 times  . Rhinitis   . Thrombocytopenia ([place])   . Tobacco abuse   . Tricuspid valve regurgitation 11/24/2015    Medications:  Infusions:  . [START ON 05/25/2017] levofloxacin (LEVAQUIN) IV    . vancomycin     Assessment: 22 yof cc headache with PMH AF, COPD on O2, lung cancer, leukemia, no current chemi. Yellow sputum with no fever, AF RVR noted, PNA on CXR with high WBC. Influenza negative. Pharmacy consulted to dose levofloxacin and vancomycin for PNA CAP.  Goal of Therapy:  Resolve infection  Prevent ADE Vancomycin trough 15 to 20 mcg/mL  Plan:  1. Levofloxacin 750 mg IV Q24H 2. Vancomycin 1000 mg IV x 1 in ED followed in approximately 10 hours (stacked dosing) by vancomycin 750 mg IV Q12H, predicted trough 19 mcg/mL. Pharmacy will continue to follow and adjust as needed to maintain trough 15 to 20 mcg/mL.   Vd 41.3 L, Ke 0.057 hr-1, T1/2 12 hr  Laural Benes, Pharm.D., BCPS Clinical Pharmacist 05/24/2017,1:33 PM

## 2017-05-24 NOTE — Progress Notes (Signed)
Initial Nutrition Assessment  DOCUMENTATION CODES:   Not applicable  INTERVENTION:  Ensure Enlive po BID, each supplement provides 350 kcal and 20 grams of protein  Provided High Calorie, High Protein Nutrition Therapy Handout at patient's request   NUTRITION DIAGNOSIS:   Increased nutrient needs related to acute illness as evidenced by estimated needs  GOAL:   Patient will meet greater than or equal to 90% of their needs  MONITOR:   PO intake, I & O's, Labs, Weight trends, Skin  REASON FOR ASSESSMENT:   Malnutrition Screening Tool    ASSESSMENT:   Sheri Ayala  is a 67 y.o. female with a known history of A fib, COPD on home O2, Lung cancer, Leukemia, no chemo currently, Mitral regurg- came with headache for last 3-4 days, no focal symptoms. Possible PNA.  Spoke with Sheri Ayala at bedside. She states a normal body weight of 120 pounds. Says she had gained to 130 pounds because her oncologist told her to, but she tends to get sick and then lose weight. Appears per chart she got up to 137 pounds, now down 5 pounds to 132 over 2 weeks, a 3.6% insignificant weight loss for timeframe. Patient normally eats 3-4 packs of cup noodles 3 or 4 days out of the week. On other days, will eat hamburger helper, pot pies or swedish meat balls, still only eating 1 meal a day. Friend who is at bedside made soup she ate yesterday. Seems appetite is ok but patient prefers to eat noodles. "I can eat them 7 days a week."  Labs reviewed:  BNP 227 Medications reviewed   NUTRITION - FOCUSED PHYSICAL EXAM:    Most Recent Value  Orbital Region  No depletion  Upper Arm Region  No depletion  Thoracic and Lumbar Region  No depletion  Buccal Region  Mild depletion  Temple Region  Mild depletion  Clavicle Bone Region  No depletion  Clavicle and Acromion Bone Region  No depletion  Scapular Bone Region  No depletion  Dorsal Hand  No depletion  Patellar Region  No depletion  Anterior Thigh Region  No  depletion  Posterior Calf Region  No depletion  Edema (RD Assessment)  None  Hair  Reviewed  Eyes  Reviewed  Mouth  Reviewed  Skin  Reviewed  Nails  Reviewed       Diet Order:  Diet Heart Room service appropriate? Yes; Fluid consistency: Thin  EDUCATION NEEDS:   Education needs have been addressed  Skin:  Skin Assessment: Reviewed RN Assessment  Last BM:  05/23/2017  Height:   Ht Readings from Last 1 Encounters:  05/24/17 5\' 9"  (1.753 m)    Weight:   Wt Readings from Last 1 Encounters:  05/24/17 132 lb 4.4 oz (60 kg)    Ideal Body Weight:  74.1 kg  BMI:  Body mass index is 19.53 kg/m.  Estimated Nutritional Needs:   Kcal:  1500-1800 calories  Protein:  77-89 grams (1.3-1.5g/kg)  Fluid:  1.5-1.8L  Sheri Ayala. Sheri Bisesi, MS, RD LDN Inpatient Clinical Dietitian Pager [phone]

## 2017-05-24 NOTE — ED Notes (Addendum)
Patient transported to CT 

## 2017-05-24 NOTE — ED Provider Notes (Signed)
[facility] Emergency Department Provider Note  ____________________________________________   First MD Initiated Contact with Patient 05/24/17 [phone]     (approximate)  I have reviewed the triage vital signs and the nursing notes.   HISTORY  Chief Complaint Headache    HPI Sheri Ayala is a 67 y.o. female with a history of atrial fibrillation as well as both lung cancer and leukemia that she states are both in remission.  She presents by EMS for severe pounding and throbbing frontal headache for about a week but which has gotten progressively worse.  States that it waxes and last 24 hours it is gotten worse and it woke her up from sleep tonight.  Nothing makes it better or worse.  She states that she is otherwise been in her usual state of health.  She uses 2 L of oxygen at home but not all the time.  She states that she has not had any fever/chills, visual changes, sore throat, nasal congestion, chest pain, shortness of breath greater than baseline, nausea, vomiting, abdominal pain, nor dysuria.  She has been trying to eat or drink and in fact states that she has been drinking more water than usual thinking that might help.  She denies any head trauma .  She reports that the pain is severe.  She has no history of headaches or migraines.   Past Medical History:  Diagnosis Date  . Allergy   . Anxiety   . Asthma   . Atrial fibrillation ([place]) 04/2016  . Bronchospasm   . Cerebrovascular accident (CVA) ([place]) 08/28/2015  . Chronic lymphocytic leukemia ([place]) 2004  . COPD (chronic obstructive pulmonary disease) ([place])   . Depression   . Emphysema of lung ([place])   . Frontal sinusitis November 2012  . H/O viral encephalitis   . Headache(784.0)   . Heart murmur   . Mitral prolapse 11/24/2015  . Mitral regurgitation 11/24/2015  . Olecranon bursitis of left elbow November 2012.   in past  . Pleurisy   . Pneumonia    13 times  . Rhinitis   . Thrombocytopenia ([place])    . Tobacco abuse   . Tricuspid valve regurgitation 11/24/2015    Patient Active Problem List   Diagnosis Date Noted  . PICC (peripherally inserted central catheter) flush 09/05/2016  . Port catheter in place 08/15/2016  . Small cell lung cancer, left ([place]) 07/25/2016  . Lobar pneumonia ([place]) 07/20/2016  . Hilar adenopathy 07/20/2016  . Leukocytosis 07/16/2016  . Obstructive atelectasis 07/15/2016  . S/P thoracentesis   . Pleural effusion on right   . History of CVA (cerebrovascular accident) 05/01/2016  . HCAP (healthcare-associated pneumonia) 04/30/2016  . Chronic a-fib ([place]) 04/30/2016  . Severe protein-calorie malnutrition ([place])   . Mass of left lung 04/10/2016  . Acute on chronic respiratory failure with hypoxia ([place]) 01/30/2015  . Right lower lobe pneumonia ([place]) 01/30/2015  . SOB (shortness of breath) 12/24/2012  . COPD with acute exacerbation ([place]) 12/22/2012  . Headache in front of head 03/27/2011  . Thrombocytopenia ([place]) 03/27/2011  . CLL (chronic lymphocytic leukemia) ([place]) 03/09/2011  . Cigarette smoker 03/09/2011    Past Surgical History:  Procedure Laterality Date  . ABDOMINAL HYSTERECTOMY    . COLONOSCOPY    . UPPER GASTROINTESTINAL ENDOSCOPY    . VIDEO BRONCHOSCOPY Bilateral 04/18/2016   Procedure: VIDEO BRONCHOSCOPY WITHOUT FLUORO;  Surgeon: Tanda Rockers, MD;  Location: [facility] ENDOSCOPY;  Service: Cardiopulmonary;  Laterality: Bilateral;  . VIDEO  BRONCHOSCOPY WITH ENDOBRONCHIAL ULTRASOUND N/A 07/23/2016   Procedure: VIDEO BRONCHOSCOPY WITH ENDOBRONCHIAL ULTRASOUND;  Surgeon: Grace Isaac, MD;  Location: [place];  Service: Thoracic;  Laterality: N/A;    Prior to Admission medications   Medication Sig Start Date End Date Taking? Authorizing Provider  albuterol (PROVENTIL) (2.5 MG/3ML) 0.083% nebulizer solution Take 3 mLs (2.5 mg total) by nebulization every 4 (four) hours as needed for wheezing or shortness of breath. 06/21/13   Francine Graven, DO    ALPRAZolam Duanne Moron) 0.5 MG tablet Take 1 tablet (0.5 mg total) by mouth 2 (two) times daily as needed for anxiety or sleep. 04/22/17   Ladell Pier, MD  budesonide-formoterol (SYMBICORT) 160-4.5 MCG/ACT inhaler Take 2 puffs first thing in am and then another 2 puffs about 12 hours later. 04/29/16   Tanda Rockers, MD  feeding supplement, ENSURE ENLIVE, (ENSURE ENLIVE) LIQD Take 237 mLs by mouth 2 (two) times daily between meals. 05/06/16   Orson Eva, MD  HYDROcodone-acetaminophen (NORCO) 5-325 MG tablet Take 1 tablet by mouth every 6 (six) hours as needed for severe pain. 05/08/17   Owens Shark, NP  OXYGEN Inhale 2 L into the lungs as needed (for shortness of breath).     [provider]  pantoprazole (PROTONIX) 40 MG tablet Take 40 mg daily by mouth. 02/11/17   [provider]  polyethylene glycol (MIRALAX / GLYCOLAX) packet Take 17 g by mouth 2 (two) times daily. Patient not taking: Reported on 04/04/2017 07/26/16   Bonnielee Haff, MD  prochlorperazine (COMPAZINE) 10 MG tablet Take 0.5 tablets (5 mg total) by mouth every 6 (six) hours as needed for nausea or vomiting. Patient not taking: Reported on 04/20/2017 10/08/16   Ladell Pier, MD  sertraline (ZOLOFT) 25 MG tablet Take 25 mg by mouth at bedtime.  04/05/16   [provider]  temazepam (RESTORIL) 30 MG capsule Take 1 capsule (30 mg total) by mouth at bedtime. 05/05/16   Orson Eva, MD  VENTOLIN HFA 108 [phone] Base) MCG/ACT inhaler Inhale 2 puffs into the lungs every 4 (four) hours as needed for wheezing or shortness of breath.  07/05/16   [provider]    Allergies Eliquis [apixaban]; Cardizem cd [diltiazem hcl er beads]; Citrus; and Penicillins  Family History  Problem Relation Age of Onset  . Aneurysm Mother   . Liver disease Father   . Congestive Heart Failure Brother   . Heart disease Brother   . Colon cancer Neg Hx   . Esophageal cancer Neg Hx   . Pancreatic cancer Neg Hx   . Rectal cancer  Neg Hx   . Stomach cancer Neg Hx     Social History Social History   Tobacco Use  . Smoking status: Former Smoker    Packs/day: 0.00    Years: 45.00    Pack years: 0.00  . Smokeless tobacco: Never Used  Substance Use Topics  . Alcohol use: No    Alcohol/week: 0.0 oz  . Drug use: No    Review of Systems Constitutional: No fever/chills Eyes: No visual changes. ENT: No sore throat. Cardiovascular: Denies chest pain. Respiratory: Denies shortness of breath. Gastrointestinal: No abdominal pain.  No nausea, no vomiting.  No diarrhea.  No constipation. Genitourinary: Negative for dysuria. Musculoskeletal: Negative for neck pain.  Negative for back pain. Integumentary: Negative for rash. Neurological: Negative for headaches, focal weakness or numbness.   ____________________________________________   PHYSICAL EXAM:  VITAL SIGNS: ED Triage Vitals  Enc  Vitals Group     BP 05/24/17 0609 132/88     Pulse Rate 05/24/17 0609 (!) 132     Resp 05/24/17 0609 20     Temp 05/24/17 0609 98.1 F (36.7 C)     Temp Source 05/24/17 0609 Oral     SpO2 05/24/17 0609 90 %     Weight 05/24/17 0617 59 kg (130 lb)     Height 05/24/17 0617 1.765 m (5' 9.5")     Head Circumference --      Peak Flow --      Pain Score 05/24/17 0617 7     Pain Loc --      Pain Edu? --      Excl. in [place]? --     Constitutional: Alert and oriented.  Appears chronically ill but is not in acute distress and walked in by herself from the ambulance. Eyes: Conjunctivae are normal. PERRL. EOMI. Head: Atraumatic.  No temporal tenderness Nose: No congestion/rhinnorhea. Mouth/Throat: Mucous membranes are slightly dry. Neck: No stridor.  No meningeal signs.   Cardiovascular: Tachycardia with irregular rhythm. Good peripheral circulation. Grossly normal heart sounds. Respiratory: Normal respiratory effort.  No retractions. Lungs CTAB.  Lung sounds are diminished throughout Gastrointestinal: Thin body habitus.  Soft  and nontender. No distention.   Musculoskeletal: No lower extremity tenderness nor edema. No gross deformities of extremities. Neurologic:  Normal speech and language. No gross focal neurologic deficits are appreciated.  Ambulating without difficulty, good strength throughout. Skin:  Skin is warm, dry and intact. No rash noted. Psychiatric: Mood and affect are normal. Speech and behavior are normal.  ____________________________________________   LABS (all labs ordered are listed, but only abnormal results are displayed)  Labs Reviewed  CBC WITH DIFFERENTIAL/PLATELET - Abnormal; Notable for the following components:      Result Value   WBC 19.5 (*)    RBC 5.21 (*)    MCH 25.9 (*)    MCHC 30.8 (*)    RDW 17.8 (*)    Neutro Abs 7.8 (*)    Lymphs Abs 11.3 (*)    All other components within normal limits  COMPREHENSIVE METABOLIC PANEL - Abnormal; Notable for the following components:   Glucose, Bld 102 (*)    Calcium 8.6 (*)    Total Protein 6.3 (*)    ALT 9 (*)    All other components within normal limits  BRAIN NATRIURETIC PEPTIDE - Abnormal; Notable for the following components:   B Natriuretic Peptide 227.0 (*)    All other components within normal limits  CULTURE, BLOOD (ROUTINE X 2)  CULTURE, BLOOD (ROUTINE X 2)  URINE CULTURE  TROPONIN I  LACTIC ACID, PLASMA  LACTIC ACID, PLASMA  URINALYSIS, COMPLETE (UACMP) WITH MICROSCOPIC  INFLUENZA PANEL BY PCR (TYPE A & B)  PROCALCITONIN  PROTIME-INR  LIPASE, BLOOD   ____________________________________________  EKG  ED ECG REPORT I, Hinda Kehr, the attending physician, personally viewed and interpreted this ECG.  Date: 05/24/2017 EKG Time: 06:14 Rate: 132 Rhythm: a-fib w/ RVR QRS Axis: normal Intervals: normal ST/T Wave abnormalities: Non-specific ST segment / T-wave changes, but no evidence of acute ischemia. Narrative Interpretation: no evidence of acute  ischemia   ____________________________________________  RADIOLOGY I, Hinda Kehr, personally viewed and evaluated these images (plain radiographs) as part of my medical decision making, as well as reviewing the written report by the radiologist.  ED MD interpretation:  diffuse LLL pneumonia  Official radiology report(s): Dg Chest 2 View  Result Date:  05/24/2017 CLINICAL DATA:  Shortness of Breath EXAM: CHEST  2 VIEW COMPARISON:  04/20/2017 FINDINGS: Cardiac shadow is stable. The right lung is clear. Diffuse left lower lobe infiltrate is noted consistent with pneumonia. No sizable effusion is seen. No bony abnormality is noted. IMPRESSION: Diffuse left lower lobe infiltrate. Electronically Signed   By: Inez Catalina M.D.   On: 05/24/2017 07:20    ____________________________________________   PROCEDURES  Critical Care performed: Yes, see critical care procedure note(s)   Procedure(s) performed:   .Critical Care Performed by: Hinda Kehr, MD Authorized by: Hinda Kehr, MD   Critical care provider statement:    Critical care time (minutes):  30   Critical care time was exclusive of:  Separately billable procedures and treating other patients   Critical care was necessary to treat or prevent imminent or life-threatening deterioration of the following conditions:  Sepsis   Critical care was time spent personally by me on the following activities:  Development of treatment plan with patient or surrogate, discussions with consultants, evaluation of patient's response to treatment, examination of patient, obtaining history from patient or surrogate, ordering and performing treatments and interventions, ordering and review of laboratory studies, ordering and review of radiographic studies, pulse oximetry, re-evaluation of patient's condition and review of old charts     ____________________________________________   INITIAL IMPRESSION / [place] / ED COURSE  As part  of my medical decision making, I reviewed the following data within the [facility] notes reviewed and incorporated, Labs reviewed , EKG interpreted , Old chart reviewed, Radiograph reviewed , Discussed with admitting physician  and Notes from prior ED visits    Differential diagnosis includes, but is not limited to, intracranial hemorrhage, meningitis/encephalitis, previous head trauma, cavernous venous thrombosis, tension headache, temporal arteritis, migraine or migraine equivalent, idiopathic intracranial hypertension, and non-specific headache.  However, given the patient's vital signs and co-morbidities, it is also very possible that she has an acute pulmonary infection (pneumonia) which is leading to her symptoms.  Given hypoxemia, will try cluster headache treatment of NRB 100% O2 for 20-30 minutes to see if this helps while pursuing a broad workup.   Clinical Course as of May 25 747  Fri May 24, 2017  0743 LLL infiltrate, WBC 20.  Normal Cr.  Will check CT head w/wo contrast to rule out mass/mets as well as acute bleeding, but suspect her headache is because she has been gradually worsening with pneumonia over the last week.  The attempt at oxygen by nonrebreather to alleviate the pain as with a cluster headache did not help so she is back on 2 L of oxygen by nasal cannula and satting approximately 90% at rest but as per her triage note she desats to 88% or less with any ambulation.  Spoke by phone with Dr. Marcille Blanco with the hospitalist service who will admit.  The patient understands and agrees with the plan.  I have ordered empiric antibiotics and rest of the sepsis protocol and made her code sepsis, but I do not believe that she meets criteria for severe sepsis at this time.  [CF]    Clinical Course User Index [CF] Hinda Kehr, MD    ____________________________________________  FINAL CLINICAL IMPRESSION(S) / ED DIAGNOSES  Final diagnoses:  Pneumonia of  left lower lobe due to infectious organism [facility])  Acute nonintractable headache, unspecified headache type  Sepsis, due to unspecified organism [facility])     MEDICATIONS GIVEN DURING THIS VISIT:  Medications  magnesium sulfate IVPB 2 g 50 mL (2 g Intravenous New Bag/Given 05/24/17 0718)  sodium chloride 0.9 % bolus 1,000 mL (not administered)  levofloxacin (LEVAQUIN) IVPB 750 mg (not administered)  vancomycin (VANCOCIN) IVPB 1000 mg/200 mL premix (not administered)  ipratropium-albuterol (DUONEB) 0.5-2.5 (3) MG/3ML nebulizer solution 3 mL (not administered)  ipratropium-albuterol (DUONEB) 0.5-2.5 (3) MG/3ML nebulizer solution 3 mL (not administered)  ipratropium-albuterol (DUONEB) 0.5-2.5 (3) MG/3ML nebulizer solution 3 mL (not administered)  sodium chloride 0.9 % bolus 1,000 mL (0 mLs Intravenous Stopped 05/24/17 0717)  metoCLOPramide (REGLAN) injection 10 mg (10 mg Intravenous Given 05/24/17 0710)  diphenhydrAMINE (BENADRYL) injection 12.5 mg (12.5 mg Intravenous Given 05/24/17 0713)  ketorolac (TORADOL) 30 MG/ML injection 15 mg (15 mg Intravenous Given 05/24/17 0711)  iopamidol (ISOVUE-300) 61 % injection 75 mL (75 mLs Intravenous Contrast Given 05/24/17 0739)     ED Discharge Orders    None       Note:  This document was prepared using Dragon voice recognition software and may include unintentional dictation errors.    Hinda Kehr, MD 05/24/17 [phone]

## 2017-05-24 NOTE — Plan of Care (Signed)
  Progressing Education: Knowledge of General Education information will improve 05/24/2017 1617 - Progressing by Cherylann Parr, RN Activity: Ability to tolerate increased activity will improve 05/24/2017 1617 - Progressing by Cherylann Parr, RN Clinical Measurements: Ability to maintain a body temperature in the normal range will improve 05/24/2017 1617 - Progressing by Cherylann Parr, RN Respiratory: Ability to maintain adequate ventilation will improve 05/24/2017 1617 - Progressing by Cherylann Parr, RN Ability to maintain a clear airway will improve 05/24/2017 1617 - Progressing by Cherylann Parr, RN

## 2017-05-24 NOTE — ED Notes (Signed)
Pt placed back on 2L of oxygen.

## 2017-05-24 NOTE — Consult Note (Addendum)
[facility]  Date of admission:  05/24/2017  Inpatient day:  05/24/2017  Consulting physician:  Dr. Vaughan Basta.   Reason for Consultation:  Mets in brain, likely from lung cancer.  Chief Complaint: Sheri Ayala is a 67 y.o. female with extensive small cell lung cancer who was admitted through the emergency room with RLL pneumonia and headache.  HPI:   The patient was diagnosed with extensive stage small cell lung cancer in 07/2016.  She presented with a left lung mass, mediastinal adenopathy, and bone metastasis.  She was treated with 6 cycles of carboplatin and etoposide (07/25/2016 - 11/21/2016) by Dr. Betsy Coder in [place].  She was last seen by Dr. Benay Spice on 04/22/2017 following an episode of unresponsiveness on 04/20/2017. The etiology was unclear.  CXR on 04/20/2017 revealed mild RLL opacity suspicious for pneumonia.  She was prescribed Levaquin.  Head CT with and without contrast on 05/02/2017 revealed no metastatic disease or acute intracranial abnormality. Nonspecific cerebral white matter disease appeared stable since the 2018 MRI.  She presented to the [facility] ER today with a week history of right sided headache.  She has had a nonproductive cough with fever.  She also describes a stomach ache and weight loss of 5-6 pounds in the past week or so.  She denies any focal weakness, numbness, tingling, balance or coordination issues.  Head CT on 05/24/2017 revealed nonspecific hypodense area in the left cerebellum which has developed since 2018.  There was no associated enhancement or mass effect to strongly suggest metastatic disease. This might be a late subacute cerebellar infarct.  Head MRI was recommended.  Head MRI on 05/24/2017 revealed a 17 mm left inferior cerebellar mass consistent with metastatic disease.  There was mild edema.  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  CXR revealed a diffuse LLL infiltrate c/w pneumonia.  She  was started on vancomycin and Levaquin.   Past Medical History:  Diagnosis Date  . Allergy   . 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 Anxiety   . Asthma   . Atrial fibrillation ([place]) 04/2016  . Bronchospasm   . Cerebrovascular accident (CVA) ([place]) 08/28/2015  . Chronic lymphocytic leukemia ([place]) 2004  . COPD (chronic obstructive pulmonary disease) ([place])   . Depression   . Emphysema of lung ([place])   . Frontal sinusitis November 2012  . H/O viral encephalitis   . Headache(784.0)   . Heart murmur   . Mitral prolapse 11/24/2015  . Mitral regurgitation 11/24/2015  . Olecranon bursitis of left elbow November 2012.   in past  . Pleurisy   . Pneumonia    13 times  . Rhinitis   . Thrombocytopenia ([place])   . Tobacco abuse   . Tricuspid valve regurgitation 11/24/2015    Past Surgical History:  Procedure Laterality Date  . ABDOMINAL HYSTERECTOMY    . COLONOSCOPY    . UPPER GASTROINTESTINAL ENDOSCOPY    . VIDEO BRONCHOSCOPY Bilateral 04/18/2016   Procedure: VIDEO BRONCHOSCOPY WITHOUT FLUORO;  Surgeon: Tanda Rockers, MD;  Location: [facility] END[facility]COPY;  Service: Cardiopulmonary;  Laterality: Bilateral;  . VIDEO BRONCHOSCOPY WITH ENDOBRONCHIAL ULTRASOUND N/A 07/23/2016   Procedure: VIDEO BRONCHOSCOPY WITH ENDOBRONCHIAL ULTRASOUND;  Surgeon: Grace Isaac, MD;  Location: [facility] OR;  Service: Thoracic;  Laterality: N/A;    Family History  Problem Relation Age of Onset  . Aneurysm Mother   . Liver disease Father   . Congestive Heart  Failure Brother   . Heart disease Brother   . Colon cancer Neg Hx   . Esophageal cancer Neg Hx   . Pancreatic cancer Neg Hx   . Rectal cancer Neg Hx   . Stomach cancer Neg Hx     Social History:  reports that she has quit smoking. She smoked 0.00 packs per day for 45.00 years. she has never used smokeless tobacco. She reports that she does not drink alcohol or use drugs.  She lives in [place].  Her son is her medical power of  attorney.  She is alone today.  Allergies:  Allergies  Allergen Reactions  . Eliquis [Apixaban] Rash    Hives, and difficulty swallowing  . Cardizem Cd [Diltiazem Hcl Er Beads] Rash  . Citrus Other (See Comments)    Burning in stomach   . Penicillins Hives, Nausea And Vomiting, Swelling and Other (See Comments)    Patient reports tolerating ampicillin.  Has patient had a PCN reaction causing immediate rash, facial/tongue/throat swelling, SOB or lightheadedness with hypotension: Yes Has patient had a PCN reaction causing severe rash involving mucus membranes or skin necrosis: unknown Has patient had a PCN reaction that required hospitalization No Has patient had a PCN reaction occurring within the last 10 years: No If all of the above answers are "NO", then may proceed with Cephalosporin use.     Medications Prior to Admission  Medication Sig Dispense Refill  . albuterol (PROVENTIL) (2.5 MG/3ML) 0.083% nebulizer solution Take 3 mLs (2.5 mg total) by nebulization every 4 (four) hours as needed for wheezing or shortness of breath. 75 mL 0  . ALPRAZolam (XANAX) 0.5 MG tablet Take 1 tablet (0.5 mg total) by mouth 2 (two) times daily as needed for anxiety or sleep. 30 tablet 0  . budesonide-formoterol (SYMBICORT) 160-4.5 MCG/ACT inhaler Take 2 puffs first thing in am and then another 2 puffs about 12 hours later. 1 Inhaler 11  . OXYGEN Inhale 2 L into the lungs as needed (for shortness of breath).     . pantoprazole (PROTONIX) 40 MG tablet Take 40 mg daily by mouth.    . sertraline (ZOLOFT) 25 MG tablet Take 25 mg by mouth at bedtime.     . temazepam (RESTORIL) 30 MG capsule Take 1 capsule (30 mg total) by mouth at bedtime. 5 capsule 0  . topiramate (TOPAMAX) 50 MG tablet Take 1 tablet by mouth daily.    . VENTOLIN HFA 108 (90 Base) MCG/ACT inhaler Inhale 2 puffs into the lungs every 4 (four) hours as needed for wheezing or shortness of breath.     . feeding supplement, ENSURE ENLIVE,  (ENSURE ENLIVE) LIQD Take 237 mLs by mouth 2 (two) times daily between meals. 60 Bottle 0  . HYDROcodone-acetaminophen (NORCO) 5-325 MG tablet Take 1 tablet by mouth every 6 (six) hours as needed for severe pain. 30 tablet 0  . polyethylene glycol (MIRALAX / GLYCOLAX) packet Take 17 g by mouth 2 (two) times daily. (Patient not taking: Reported on 04/04/2017) 60 each 0  . prochlorperazine (COMPAZINE) 10 MG tablet Take 0.5 tablets (5 mg total) by mouth every 6 (six) hours as needed for nausea or vomiting. (Patient not taking: Reported on 04/20/2017) 30 tablet 1  . rizatriptan (MAXALT) 10 MG tablet Take 10 mg by mouth as needed.       Review of Systems: GENERAL:  Feels "ok".  Fever at home.  No sweats.  Weight loss of 5-6 pounds in the past week or  so. PERFORMANCE STATUS (ECOG):  2 HEENT:  No visual changes, runny nose, sore throat, mouth sores or tenderness. Lungs: Shortness of breath.  Non-productive cough.  No hemoptysis. Cardiac:  No chest pain, palpitations, orthopnea, or PND. GI:  Stomach ache.  No nausea, vomiting, diarrhea, constipation, melena or hematochezia. GU:  No urgency, frequency, dysuria, or hematuria. Musculoskeletal:  No back pain.  No joint pain.  No muscle tenderness. Extremities:  No pain or swelling. Skin:  No rashes or skin changes. Neuro:  Headache.  No numbness or weakness, balance or coordination issues. Endocrine:  No diabetes, thyroid issues, hot flashes or night sweats. Psych:  No mood changes, depression or anxiety. Pain:  No focal pain. Review of systems:  All other systems reviewed and found to be negative.  Physical Exam:  Blood pressure (!) 123/56, pulse 89, temperature 98.6 F (37 C), temperature source Oral, resp. rate 15, height '5\' 9"'$  (1.753 m), weight 132 lb 4.4 oz (60 kg), SpO2 93 %. GENERAL:  Thin woman sitting comfortably on the medical unit in no acute distress. MENTAL STATUS:  Alert and oriented to person, place and time. HEAD:  Brown hair.   Normocephalic, atraumatic, face symmetric, no Cushingoid features. EYES:  Blue eyes.  Pupils equal round and reactive to light and accomodation.  No conjunctivitis or scleral icterus. ENT:  [place] in place.  Oropharynx clear without lesion.  Tongue normal.  Dentures.  Mucous membranes moist.  RESPIRATORY:  Clear to auscultation without rales, wheezes or rhonchi. CARDIOVASCULAR:  Regular rate and rhythm without murmur, rub or gallop. ABDOMEN:  Soft, non-tender, with active bowel sounds, and no hepatosplenomegaly.  No masses. SKIN:  No rashes, ulcers or lesions. EXTREMITIES: No edema, no skin discoloration or tenderness.  No palpable cords. LYMPH NODES: No palpable cervical, supraclavicular, axillary or inguinal adenopathy  NEUROLOGICAL:  Alert & oriented, cranial nerves II-XII intact; Nystagmus on left lateral gaze with associated diplopia; Motor strength 5/5 throughout; sensation intact; finger to nose and RAM normal except for slight dysmetria on the left; unable to walk heel to toe; negative Rhomberg; patellar reflexes 2++ left and 2+ right; No clonus or Babinski.  PSYCH:  Appropriate.   Results for orders placed or performed during the hospital encounter of 05/24/17 (from the past 48 hour(s))  Procalcitonin     Status: None   Collection Time: 05/24/17  6:00 AM  Result Value Ref Range   Procalcitonin <0.10 ng/mL    Comment:        Interpretation: PCT (Procalcitonin) <= 0.5 ng/mL: Systemic infection (sepsis) is not likely. Local bacterial infection is possible. (NOTE)       Sepsis PCT Algorithm           Lower Respiratory Tract                                      Infection PCT Algorithm    ----------------------------     ----------------------------         PCT < 0.25 ng/mL                PCT < 0.10 ng/mL         Strongly encourage             Strongly discourage   discontinuation of antibiotics    initiation of antibiotics    ----------------------------      -----------------------------       PCT  0.25 - 0.50 ng/mL            PCT 0.10 - 0.25 ng/mL               OR       >80% decrease in PCT            Discourage initiation of                                            antibiotics      Encourage discontinuation           of antibiotics    ----------------------------     -----------------------------         PCT >= 0.50 ng/mL              PCT 0.26 - 0.50 ng/mL               AND        <80% decrease in PCT             Encourage initiation of                                             antibiotics       Encourage continuation           of antibiotics    ----------------------------     -----------------------------        PCT >= 0.50 ng/mL                  PCT > 0.50 ng/mL               AND         increase in PCT                  Strongly encourage                                      initiation of antibiotics    Strongly encourage escalation           of antibiotics                                     -----------------------------                                           PCT <= 0.25 ng/mL                                                 OR                                        > 80% decrease in PCT  Discontinue / Do not initiate                                             antibiotics Performed at [facility], [address] [place], [place] [address]   Protime-INR     Status: None   Collection Time: 05/24/17  6:00 AM  Result Value Ref Range   Prothrombin Time 14.6 11.4 - 15.2 seconds   INR 1.15     Comment: Performed at [facility], [address] [place], [place] [address]  Lipase, blood     Status: None   Collection Time: 05/24/17  6:00 AM  Result Value Ref Range   Lipase 24 11 - 51 U/L    Comment: Performed at [facility], [address] [place], [place] [address]  CBC with Differential/Platelet     Status: Abnormal   Collection Time: 05/24/17  6:21 AM   Result Value Ref Range   WBC 19.5 (H) 3.6 - 11.0 K/uL   RBC 5.21 (H) 3.80 - 5.20 MIL/uL   Hemoglobin 13.5 12.0 - 16.0 g/dL    Comment: RESULT REPEATED AND VERIFIED   HCT 43.8 35.0 - 47.0 %    Comment: RESULT REPEATED AND VERIFIED   MCV 84.1 80.0 - 100.0 fL   MCH 25.9 (L) 26.0 - 34.0 pg   MCHC 30.8 (L) 32.0 - 36.0 g/dL   RDW 17.8 (H) 11.5 - 14.5 %   Platelets 173 150 - 440 K/uL   Neutrophils Relative % 40 %   Lymphocytes Relative 58 %   Monocytes Relative 2 %   Eosinophils Relative 0 %   Basophils Relative 0 %   Neutro Abs 7.8 (H) 1.4 - 6.5 K/uL   Lymphs Abs 11.3 (H) 1.0 - 3.6 K/uL   Monocytes Absolute 0.4 0.2 - 0.9 K/uL   Eosinophils Absolute 0.0 0 - 0.7 K/uL   Basophils Absolute 0.0 0 - 0.1 K/uL   RBC Morphology MIXED RBC POPULATION    WBC Morphology ATYPICAL LYMPHOCYTES     Comment: Performed at [facility], [name] [place], [place] [address]  Comprehensive metabolic panel     Status: Abnormal   Collection Time: 05/24/17  6:21 AM  Result Value Ref Range   Sodium 142 135 - 145 mmol/L   Potassium 4.0 3.5 - 5.1 mmol/L   Chloride 108 101 - 111 mmol/L   CO2 22 22 - 32 mmol/L   Glucose, Bld 102 (H) 65 - 99 mg/dL   BUN 17 6 - 20 mg/dL   Creatinine, Ser 0.67 0.44 - 1.00 mg/dL   Calcium 8.6 (L) 8.9 - 10.3 mg/dL   Total Protein 6.3 (L) 6.5 - 8.1 g/dL   Albumin 3.6 3.5 - 5.0 g/dL   AST 19 15 - 41 U/L   ALT 9 (L) 14 - 54 U/L   Alkaline Phosphatase 91 38 - 126 U/L   Total Bilirubin 0.4 0.3 - 1.2 mg/dL   GFR calc non Af Amer >60 >60 mL/min   GFR calc Af Amer >60 >60 mL/min    Comment: (NOTE) The eGFR has been calculated using the CKD EPI equation. This calculation has not been validated in all clinical situations. eGFR's persistently <60 mL/min signify possible Chronic Kidney Disease.    Anion gap 12 5 - 15    Comment: Performed at [facility], [place], [place] [address]  Troponin I     Status: None   Collection Time: 05/24/17   6:21 AM  Result Value Ref Range   Troponin I <0.03 <0.03 ng/mL    Comment: Performed at [facility], [address] [place], [place] [address]  Brain natriuretic peptide     Status: Abnormal   Collection Time: 05/24/17  6:21 AM  Result Value Ref Range   B Natriuretic Peptide 227.0 (H) 0.0 - 100.0 pg/mL    Comment: Performed at [facility], [address] [place], [place] [address]  Lactic acid, plasma     Status: None   Collection Time: 05/24/17  7:40 AM  Result Value Ref Range   Lactic Acid, Venous 0.7 0.5 - 1.9 mmol/L    Comment: Performed at [facility], [address], [place], [place] [address]  Blood Culture (routine x 2)     Status: None (Preliminary result)   Collection Time: 05/24/17  7:40 AM  Result Value Ref Range   Specimen Description BLOOD BLOOD LEFT FOREARM    Special Requests      BOTTLES DRAWN AEROBIC AND ANAEROBIC Blood Culture adequate volume   Culture      NO GROWTH < 12 HOURS Performed at [facility], [address], [place], [place] [address]    Report Status PENDING   Blood Culture (routine x 2)     Status: None (Preliminary result)   Collection Time: 05/24/17  7:40 AM  Result Value Ref Range   Specimen Description BLOOD BLOOD RIGHT HAND    Special Requests      BOTTLES DRAWN AEROBIC AND ANAEROBIC Blood Culture adequate volume   Culture      NO GROWTH < 12 HOURS Performed at [facility], [address] [place], [place] [address]    Report Status PENDING   Influenza panel by PCR (type A & B)     Status: None   Collection Time: 05/24/17  7:40 AM  Result Value Ref Range   Influenza A By PCR NEGATIVE NEGATIVE   Influenza B By PCR NEGATIVE NEGATIVE    Comment: (NOTE) The Xpert Xpress Flu assay is intended as an aid in the diagnosis of  influenza and should not be used as a sole basis for treatment.  This  assay is FDA approved for nasopharyngeal swab specimens only. Nasal  washings and aspirates  are unacceptable for Xpert Xpress Flu testing. Performed at [facility], [address] [place], [place] [address]   TSH     Status: None   Collection Time: 05/24/17  7:40 AM  Result Value Ref Range   TSH 1.346 0.350 - 4.500 uIU/mL    Comment: Performed by a 3rd Generation assay with a functional sensitivity of <=0.01 uIU/mL. Performed at [facility], [address] [place], [place] [address]   Urinalysis, Complete w Microscopic     Status: Abnormal   Collection Time: 05/24/17 10:30 AM  Result Value Ref Range   Color, Urine YELLOW (A) YELLOW   APPearance HAZY (A) CLEAR   Specific Gravity, Urine 1.035 (H) 1.005 - 1.030   pH 5.0 5.0 - 8.0   Glucose, UA NEGATIVE NEGATIVE mg/dL   Hgb urine dipstick NEGATIVE NEGATIVE   Bilirubin Urine NEGATIVE NEGATIVE   Ketones, ur NEGATIVE NEGATIVE mg/dL   Protein, ur NEGATIVE NEGATIVE mg/dL   Nitrite NEGATIVE NEGATIVE   Leukocytes, UA NEGATIVE NEGATIVE   RBC /  HPF 0-5 0 - 5 RBC/hpf   WBC, UA 0-5 0 - 5 WBC/hpf   Bacteria, UA NONE SEEN NONE SEEN   Squamous Epithelial / LPF 6-30 (A) NONE SEEN   Mucus PRESENT     Comment: Performed at [facility], [address] [place], [place] [address]  CBC     Status: Abnormal   Collection Time: 05/24/17  3:23 PM  Result Value Ref Range   WBC 12.3 (H) 3.6 - 11.0 K/uL   RBC 4.90 3.80 - 5.20 MIL/uL   Hemoglobin 13.0 12.0 - 16.0 g/dL   HCT 41.2 35.0 - 47.0 %   MCV 84.1 80.0 - 100.0 fL   MCH 26.5 26.0 - 34.0 pg   MCHC 31.5 (L) 32.0 - 36.0 g/dL   RDW 17.6 (H) 11.5 - 14.5 %   Platelets 148 (L) 150 - 440 K/uL    Comment: Performed at [facility], [address] [place], [place] [address]  Creatinine, serum     Status: None   Collection Time: 05/24/17  3:23 PM  Result Value Ref Range   Creatinine, Ser 0.76 0.44 - 1.00 mg/dL   GFR calc non Af Amer >60 >60 mL/min   GFR calc Af Amer >60 >60 mL/min    Comment: (NOTE) The eGFR has been calculated using the CKD EPI  equation. This calculation has not been validated in all clinical situations. eGFR's persistently <60 mL/min signify possible Chronic Kidney Disease. Performed at [facility], [address] [place], [place] [address]    Dg Chest 2 View  Result Date: 05/24/2017 CLINICAL DATA:  Shortness of Breath EXAM: CHEST  2 VIEW COMPARISON:  04/20/2017 FINDINGS: Cardiac shadow is stable. The right lung is clear. Diffuse left lower lobe infiltrate is noted consistent with pneumonia. No sizable effusion is seen. No bony abnormality is noted. IMPRESSION: Diffuse left lower lobe infiltrate. Electronically Signed   By: Inez Catalina M.D.   On: 05/24/2017 07:20   Ct Head W Or Wo Contrast  Result Date: 05/24/2017 CLINICAL DATA:  67 year old female with 1 week of severe headache. No known injury. 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 EXAM: CT HEAD WITHOUT AND WITH CONTRAST TECHNIQUE: Contiguous axial images were obtained from the base of the skull through the vertex without and with intravenous contrast CONTRAST:  28m ISOVUE-300 IOPAMIDOL (ISOVUE-300) INJECTION 61% COMPARISON:  05/02/2017 head CT, brain MRI 07/24/2016 and earlier. FINDINGS: Brain: Stable cerebral volume. No midline shift, ventriculomegaly, mass effect, intracranial hemorrhage or evidence of cortically based acute infarction. No abnormal enhancement identified. Asymmetric hypodensity in the left cerebellum is new since the head CT 09/29/2015, and not correlated on the 2018 brain MRI. This is increased in conspicuity since January. There is no associated mass effect or enhancement. The caudal aspect of this area has a curvilinear morphology raising the possibility of encephalomalacia (series 2, image 5). Elsewhere stable gray-white matter differentiation throughout the brain, with chronic nonspecific cerebral white matter hypodensity. Vascular: Mild Calcified atherosclerosis at the skull base. Major intracranial vascular structures  are enhancing. Skull: Bone mineralization remains stable. No acute or suspicious osseous lesion identified. Sinuses/Orbits: Visualized paranasal sinuses and mastoids are stable and well pneumatized. Other: Visualized orbits and scalp soft tissues are within normal limits. IMPRESSION: 1. Nonspecific hypodense area in the left cerebellum has developed since 2018. No associated enhancement or mass effect to strongly suggest metastatic disease. This might be a late subacute cerebellar infarct. Recommend Brain MRI without and with contrast  to further characterize. 2. No other acute finding. Electronically Signed   By: Genevie Ann M.D.   On: 05/24/2017 08:23   Mr Jeri Cos XF Contrast  Result Date: 05/24/2017 CLINICAL DATA:  Acute headache.  History of lung cancer. EXAM: MRI HEAD WITHOUT AND WITH CONTRAST TECHNIQUE: Multiplanar, multiecho pulse sequences of the brain and surrounding structures were obtained without and with intravenous contrast. CONTRAST:  38m MULTIHANCE GADOBENATE DIMEGLUMINE 529 MG/ML IV SOLN COMPARISON:  Head CT from earlier today.  Brain MRI 07/24/2016 FINDINGS: Brain: The left inferior cerebellar abnormality reflects an enhancing superficial mass measuring up to 17 mm. The mass splays folia but is still likely intra-axial. No second lesion is seen. No infarct, hemorrhage, hydrocephalus, or collection. Chronic white matter disease, usually microvascular ischemic. Vascular: 3 mm enhancing outpouching extending inferiorly from the right MCA bifurcation. Skull and upper cervical spine: No noted metastatic lesion. Sinuses/Orbits: No visible marrow lesion. Other: These results were called by telephone at the time of interpretation on 05/24/2017 at 10:46 am to Dr. VVaughan Basta, who verbally acknowledged these results. IMPRESSION: 1. 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 2. 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 At follow-up brain MRI a MRA is recommended.  Electronically Signed   By: JMonte FantasiaM.D.   On: 05/24/2017 11:11    Assessment:  The patient is a 67y.o. woman with extensive stage small cell lung cancer with new left inferior cerebellar metastasis.    Head MRI on 05/24/2017 revealed a 17 mm left inferior cerebellar mass consistent with metastatic disease.  There was mild edema.  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  CXR revealed a diffuse LLL infiltrate c/w pneumonia.  She is on vancomycin and Levaquin.  She was treated for a RLL pneumonia on 04/20/2017 with Levaquin.  Symptomatically, her fever and cough are better.  She has a right sided headache.  Exam revealed difficulty with heal to toe walk and left lateral diplopia.  Plan:   1.  Oncology:  Patient with extensive stage small cell lung cancer with new CNS metastasis.  Images discussed with patient.  Discussed plan for Decadron (she has mild edema per radiology).  Discuss plan for local radiation (stereotactic radiosurgery).  [place]  Plan to consult Dr. CBaruch Goutyon Monday, 05/27/2017.   2.  Infectious disease:  Patient treated for RLL pneumonia in early 04/2017.  She presents with LLL pneumonia.  She is on broad spectrum antibiotics.  Blood cultures negative to date.   Thank you for allowing me to participate in Sheri Ayala 's care.  I will follow her closely with you while hospitalized and after discharge in the outpatient department.   MLequita Asal MD  05/24/2017, 9:30 PM

## 2017-05-24 NOTE — H&P (Signed)
[facility] [place] NAME: Sheri Ayala    MR#:  [id number]  DATE OF BIRTH:  1950/10/19  DATE OF ADMISSION:  05/24/2017  PRIMARY CARE PHYSICIAN: Alvester Chou, NP   REQUESTING/REFERRING PHYSICIAN: Marcos Eke  CHIEF COMPLAINT:   Chief Complaint  Patient presents with  . Headache    HISTORY OF PRESENT ILLNESS: Sheri Ayala  is a 67 y.o. female with a known history of A fib, COPD on home O2, Lung cancer, Leukemia, no chemo currently, Mitral regurg- came with headache for last 3-4 days, no focal symptoms. She also have cough with yellow sputum, no fever or chest pain. Noted in A fib RVR, Pneumonia on Xray and high WBCs with requiring cont oxygen. Flu test is negative. CT have questionable findings.  PAST MEDICAL HISTORY:   Past Medical History:  Diagnosis Date  . Allergy   . Anxiety   . Asthma   . Atrial fibrillation ([place]) 04/2016  . Bronchospasm   . Cerebrovascular accident (CVA) ([place]) 08/28/2015  . Chronic lymphocytic leukemia ([place]) 2004  . COPD (chronic obstructive pulmonary disease) ([place])   . Depression   . Emphysema of lung ([place])   . Frontal sinusitis November 2012  . H/O viral encephalitis   . Headache(784.0)   . Heart murmur   . Mitral prolapse 11/24/2015  . Mitral regurgitation 11/24/2015  . Olecranon bursitis of left elbow November 2012.   in past  . Pleurisy   . Pneumonia    13 times  . Rhinitis   . Thrombocytopenia ([place])   . Tobacco abuse   . Tricuspid valve regurgitation 11/24/2015    PAST SURGICAL HISTORY:  Past Surgical History:  Procedure Laterality Date  . ABDOMINAL HYSTERECTOMY    . COLONOSCOPY    . UPPER GASTROINTESTINAL ENDOSCOPY    . VIDEO BRONCHOSCOPY Bilateral 04/18/2016   Procedure: VIDEO BRONCHOSCOPY WITHOUT FLUORO;  Surgeon: Tanda Rockers, MD;  Location: [facility];  Service: Cardiopulmonary;  Laterality: Bilateral;  . VIDEO BRONCHOSCOPY WITH ENDOBRONCHIAL ULTRASOUND N/A 07/23/2016   Procedure: VIDEO  BRONCHOSCOPY WITH ENDOBRONCHIAL ULTRASOUND;  Surgeon: Grace Isaac, MD;  Location: [facility];  Service: Thoracic;  Laterality: N/A;    SOCIAL HISTORY:  Social History   Tobacco Use  . Smoking status: Former Smoker    Packs/day: 0.00    Years: 45.00    Pack years: 0.00  . Smokeless tobacco: Never Used  Substance Use Topics  . Alcohol use: No    Alcohol/week: 0.0 oz    FAMILY HISTORY:  Family History  Problem Relation Age of Onset  . Aneurysm Mother   . Liver disease Father   . Congestive Heart Failure Brother   . Heart disease Brother   . Colon cancer Neg Hx   . Esophageal cancer Neg Hx   . Pancreatic cancer Neg Hx   . Rectal cancer Neg Hx   . Stomach cancer Neg Hx     DRUG ALLERGIES:  Allergies  Allergen Reactions  . Eliquis [Apixaban] Rash    Hives, and difficulty swallowing  . Cardizem Cd [Diltiazem Hcl Er Beads] Rash  . Citrus Other (See Comments)    Burning in stomach   . Penicillins Hives, Nausea And Vomiting, Swelling and Other (See Comments)    Patient reports tolerating ampicillin.  Has patient had a PCN reaction causing immediate rash, facial/tongue/throat swelling, SOB or lightheadedness with hypotension: Yes Has patient had a PCN reaction causing severe rash involving mucus membranes or skin necrosis:  unknown Has patient had a PCN reaction that required hospitalization No Has patient had a PCN reaction occurring within the last 10 years: No If all of the above answers are "NO", then may proceed with Cephalosporin use.     REVIEW OF SYSTEMS:   CONSTITUTIONAL: No fever, fatigue or weakness.  EYES: No blurred or double vision.  EARS, NOSE, AND THROAT: No tinnitus or ear pain.  RESPIRATORY: positive for cough, shortness of breath, no wheezing or hemoptysis.  CARDIOVASCULAR: No chest pain, orthopnea, edema.  GASTROINTESTINAL: No nausea, vomiting, diarrhea or abdominal pain.  GENITOURINARY: No dysuria, hematuria.  ENDOCRINE: No polyuria, nocturia,   HEMATOLOGY: No anemia, easy bruising or bleeding SKIN: No rash or lesion. MUSCULOSKELETAL: No joint pain or arthritis.   NEUROLOGIC: No tingling, numbness, weakness.  PSYCHIATRY: No anxiety or depression.   MEDICATIONS AT HOME:  Prior to Admission medications   Medication Sig Start Date End Date Taking? Authorizing Provider  albuterol (PROVENTIL) (2.5 MG/3ML) 0.083% nebulizer solution Take 3 mLs (2.5 mg total) by nebulization every 4 (four) hours as needed for wheezing or shortness of breath. 06/21/13  Yes Francine Graven, DO  ALPRAZolam Duanne Moron) 0.5 MG tablet Take 1 tablet (0.5 mg total) by mouth 2 (two) times daily as needed for anxiety or sleep. 04/22/17  Yes Ladell Pier, MD  budesonide-formoterol [facility]) 160-4.5 MCG/ACT inhaler Take 2 puffs first thing in am and then another 2 puffs about 12 hours later. 04/29/16  Yes Tanda Rockers, MD  OXYGEN Inhale 2 L into the lungs as needed (for shortness of breath).    Yes [provider]  pantoprazole (PROTONIX) 40 MG tablet Take 40 mg daily by mouth. 02/11/17  Yes [provider]  sertraline (ZOLOFT) 25 MG tablet Take 25 mg by mouth at bedtime.  04/05/16  Yes [provider]  temazepam (RESTORIL) 30 MG capsule Take 1 capsule (30 mg total) by mouth at bedtime. 05/05/16  Yes Tat, Shanon Brow, MD  topiramate (TOPAMAX) 50 MG tablet Take 1 tablet by mouth daily. 05/17/17  Yes [provider]  VENTOLIN HFA 108 (90 Base) MCG/ACT inhaler Inhale 2 puffs into the lungs every 4 (four) hours as needed for wheezing or shortness of breath.  07/05/16  Yes [provider]  feeding supplement, ENSURE ENLIVE, (ENSURE ENLIVE) LIQD Take 237 mLs by mouth 2 (two) times daily between meals. 05/06/16   Orson Eva, MD  HYDROcodone-acetaminophen (NORCO) 5-325 MG tablet Take 1 tablet by mouth every 6 (six) hours as needed for severe pain. 05/08/17   Owens Shark, NP  polyethylene glycol [facility] / GLYCOLAX) packet Take 17 g by mouth 2  (two) times daily. Patient not taking: Reported on 04/04/2017 07/26/16   Bonnielee Haff, MD  prochlorperazine (COMPAZINE) 10 MG tablet Take 0.5 tablets (5 mg total) by mouth every 6 (six) hours as needed for nausea or vomiting. Patient not taking: Reported on 04/20/2017 10/08/16   Ladell Pier, MD  rizatriptan (MAXALT) 10 MG tablet Take 10 mg by mouth as needed.  05/17/17   [provider]      PHYSICAL EXAMINATION:   VITAL SIGNS: Blood pressure (!) 112/54, pulse (!) 101, temperature 98.1 F (36.7 C), temperature source Oral, resp. rate (!) 25, height 5' 9.5" (1.765 m), weight 59 kg (130 lb), SpO2 (!) 87 %.  GENERAL:  67 y.o.-year-old patient lying in the bed with no acute distress.  EYES: Pupils equal, round, reactive to light and accommodation. No scleral icterus. Extraocular muscles intact.  HEENT: Head atraumatic, normocephalic. Oropharynx and nasopharynx clear.  NECK:  Supple, no jugular venous distention. No thyroid enlargement, no tenderness.  LUNGS: Normal breath sounds bilaterally, no wheezing, some crepitation. No use of accessory muscles of respiration.  CARDIOVASCULAR: S1, S2 normal. No murmurs, rubs, or gallops.  ABDOMEN: Soft, nontender, nondistended. Bowel sounds present. No organomegaly or mass.  EXTREMITIES: No pedal edema, cyanosis, or clubbing.  NEUROLOGIC: Cranial nerves II through XII are intact. Muscle strength 5/5 in all extremities. Sensation intact. Gait not checked.  PSYCHIATRIC: The patient is alert and oriented x 3.  SKIN: No obvious rash, lesion, or ulcer.   LABORATORY PANEL:   CBC Recent Labs  Lab 05/24/17 0621  WBC 19.5*  HGB 13.5  HCT 43.8  PLT 173  MCV 84.1  MCH 25.9*  MCHC 30.8*  RDW 17.8*  LYMPHSABS 11.3*  MONOABS 0.4  EOSABS 0.0  BASOSABS 0.0   ------------------------------------------------------------------------------------------------------------------  Chemistries  Recent Labs  Lab 05/24/17 0621  NA 142  K 4.0  CL  108  CO2 22  GLUCOSE 102*  BUN 17  CREATININE 0.67  CALCIUM 8.6*  AST 19  ALT 9*  ALKPHOS 91  BILITOT 0.4   ------------------------------------------------------------------------------------------------------------------ estimated creatinine clearance is 63.6 mL/min (by C-G formula based on SCr of 0.67 mg/dL). ------------------------------------------------------------------------------------------------------------------ No results for input(s): TSH, T4TOTAL, T3FREE, THYROIDAB in the last 72 hours.  Invalid input(s): FREET3   Coagulation profile Recent Labs  Lab 05/24/17 0600  INR 1.15   ------------------------------------------------------------------------------------------------------------------- No results for input(s): DDIMER in the last 72 hours. -------------------------------------------------------------------------------------------------------------------  Cardiac Enzymes Recent Labs  Lab 05/24/17 0621  TROPONINI <0.03   ------------------------------------------------------------------------------------------------------------------ Invalid input(s): POCBNP  ---------------------------------------------------------------------------------------------------------------  Urinalysis    Component Value Date/Time   COLORURINE STRAW (A) 09/24/2015 1946   APPEARANCEUR CLEAR (A) 09/24/2015 1946   LABSPEC 1.010 09/24/2015 1946   PHURINE 5.0 09/24/2015 1946   GLUCOSEU NEGATIVE 09/24/2015 1946   HGBUR 1+ (A) 09/24/2015 1946   BILIRUBINUR NEGATIVE 09/24/2015 1946   KETONESUR NEGATIVE 09/24/2015 1946   PROTEINUR NEGATIVE 09/24/2015 1946   UROBILINOGEN 0.2 01/06/2015 1843   NITRITE NEGATIVE 09/24/2015 1946   LEUKOCYTESUR NEGATIVE 09/24/2015 1946     RADIOLOGY: Dg Chest 2 View  Result Date: 05/24/2017 CLINICAL DATA:  Shortness of Breath EXAM: CHEST  2 VIEW COMPARISON:  04/20/2017 FINDINGS: Cardiac shadow is stable. The right lung is clear. Diffuse left  lower lobe infiltrate is noted consistent with pneumonia. No sizable effusion is seen. No bony abnormality is noted. IMPRESSION: Diffuse left lower lobe infiltrate. Electronically Signed   By: Inez Catalina M.D.   On: 05/24/2017 07:20   Ct Head W Or Wo Contrast  Result Date: 05/24/2017 CLINICAL DATA:  67 year old female with 1 week of severe headache. No known injury. 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 EXAM: CT HEAD WITHOUT AND WITH CONTRAST TECHNIQUE: Contiguous axial images were obtained from the base of the skull through the vertex without and with intravenous contrast CONTRAST:  76mL ISOVUE-300 IOPAMIDOL (ISOVUE-300) INJECTION 61% COMPARISON:  05/02/2017 head CT, brain MRI 07/24/2016 and earlier. FINDINGS: Brain: Stable cerebral volume. No midline shift, ventriculomegaly, mass effect, intracranial hemorrhage or evidence of cortically based acute infarction. No abnormal enhancement identified. Asymmetric hypodensity in the left cerebellum is new since the head CT 09/29/2015, and not correlated on the 2018 brain MRI. This is increased in conspicuity since January. There is no associated mass effect or enhancement. The caudal aspect of this area has a curvilinear morphology raising the possibility  of encephalomalacia (series 2, image 5). Elsewhere stable gray-white matter differentiation throughout the brain, with chronic nonspecific cerebral white matter hypodensity. Vascular: Mild Calcified atherosclerosis at the skull base. Major intracranial vascular structures are enhancing. Skull: Bone mineralization remains stable. No acute or suspicious osseous lesion identified. Sinuses/Orbits: Visualized paranasal sinuses and mastoids are stable and well pneumatized. Other: Visualized orbits and scalp soft tissues are within normal limits. IMPRESSION: 1. Nonspecific hypodense area in the left cerebellum has developed since 2018. No associated enhancement or mass effect to strongly suggest metastatic  disease. This might be a late subacute cerebellar infarct. Recommend Brain MRI without and with contrast to further characterize. 2. No other acute finding. Electronically Signed   By: Genevie Ann M.D.   On: 05/24/2017 08:23    EKG: Orders placed or performed during the hospital encounter of 05/24/17  . EKG 12-Lead  . EKG 12-Lead    IMPRESSION AND PLAN:  * Ac on ch respi failure   Due to Commu acquired pneumonia   IV levaquin.   Supplemental oxygen.   Sputum cx.   Neb albuterol.    Flu negative.  * A fib   Likely due to Stress of PNA   Check TSH    Monitor.   Rate controlled after metoprolol inj.  * Head ache   CT head have questionable subacute stroke Vs, tumor.   MRI with and without contrast.   Imitrex.  * COPD   No wheezing    Cont spiriva, Inhaler and Albuterol nebs.   All the records are reviewed and case discussed with ED provider. Management plans discussed with the patient, family and they are in agreement.  CODE STATUS: DNR Code Status History    Date Active Date Inactive Code Status Order ID Comments User Context   07/15/2016 20:20 07/27/2016 18:40 Full Code [id number]  Vianne Bulls, MD ED   04/30/2016 20:16 05/07/2016 18:25 Full Code [id number]  Barton Dubois, MD Inpatient   04/18/2016 10:37 04/19/2016 16:02 Full Code [id number]  Erick Colace, NP Inpatient   01/29/2015 20:59 01/31/2015 17:21 Full Code [id number]  Henreitta Leber, MD Inpatient   12/22/2012 21:21 12/25/2012 17:35 Full Code [id number]  Rise Patience, MD Inpatient   03/27/2011 17:44 03/29/2011 17:01 Full Code [id number]  Rexene Alberts, MD ED   03/09/2011 23:04 03/12/2011 17:41 Full Code [id number]  Ron Parker, RN Inpatient    Advance Directive Documentation     Most Recent Value  Type of Advance Directive  Living will  Pre-existing out of facility DNR order (yellow form or pink MOST form)  No data  "MOST" Form in Place?  No data       TOTAL TIME TAKING CARE OF THIS PATIENT: 55 minutes.     Vaughan Basta M.D on 05/24/2017   Between 7am to 6pm - Pager - [phone]  After 6pm go to [email or web address] - password [email or web address] [facility]  Office  [phone]  CC: Primary care physician; Alvester Chou, NP   Note: This dictation was prepared with Dragon dictation along with smaller phrase technology. Any transcriptional errors that result from this process are unintentional.

## 2017-05-24 NOTE — ED Notes (Signed)
EDP placing pt on non-rebreather at this time for trx for HA

## 2017-05-25 LAB — URINE CULTURE

## 2017-05-25 LAB — BASIC METABOLIC PANEL
ANION GAP: 9 (ref 5–15)
BUN: 15 mg/dL (ref 6–20)
CHLORIDE: 106 mmol/L (ref 101–111)
CO2: 25 mmol/L (ref 22–32)
Calcium: 8.5 mg/dL — ABNORMAL LOW (ref 8.9–10.3)
Creatinine, Ser: 0.75 mg/dL (ref 0.44–1.00)
GFR calc Af Amer: >60 mL/min (ref >60)
GFR calc non Af Amer: >60 mL/min (ref >60)
GLUCOSE: 113 mg/dL — AB (ref 65–99)
POTASSIUM: 4.2 mmol/L (ref 3.5–5.1)
Sodium: 140 mmol/L (ref 135–145)

## 2017-05-25 LAB — CBC
HEMATOCRIT: 39 % (ref 35.0–47.0)
HEMOGLOBIN: 12.2 g/dL (ref 12.0–16.0)
MCH: 26.2 pg (ref 26.0–34.0)
MCHC: 31.1 g/dL — ABNORMAL LOW (ref 32.0–36.0)
MCV: 84.2 fL (ref 80.0–100.0)
Platelets: 127 10*3/uL — ABNORMAL LOW (ref 150–440)
RBC: 4.64 MIL/uL (ref 3.80–5.20)
RDW: 16.9 % — AB (ref 11.5–14.5)
WBC: 7.8 10*3/uL (ref 3.6–11.0)

## 2017-05-25 LAB — MRSA PCR SCREENING: MRSA by PCR: POSITIVE — AB

## 2017-05-25 MED ORDER — MUPIROCIN 2 % EX OINT
1.0000 "application " | TOPICAL_OINTMENT | Freq: Two times a day (BID) | CUTANEOUS | Status: DC
  Administered 2017-05-25 – 2017-05-27 (×4): 1 via NASAL
  Filled 2017-05-25: qty 22

## 2017-05-25 MED ORDER — CHLORHEXIDINE GLUCONATE CLOTH 2 % EX PADS
6.0000 | MEDICATED_PAD | Freq: Every day | CUTANEOUS | Status: DC
  Administered 2017-05-26 – 2017-05-27 (×2): 6 via TOPICAL

## 2017-05-25 NOTE — Progress Notes (Signed)
[facility] [place] NAME: Sheri Ayala    MR#:  [id number]  DATE OF BIRTH:  [birth year]  SUBJECTIVE:  CHIEF COMPLAINT:   Chief Complaint  Patient presents with  . 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 Noted to have left lower lobe pneumonia and metastatic lesion in the left cerebellum  REVIEW OF SYSTEMS:  Review of Systems  Constitutional: Negative for chills, fever and malaise/fatigue.  HENT: Negative for congestion, ear discharge, hearing loss and nosebleeds.   Eyes: Negative for blurred vision and double vision.  Respiratory: Positive for cough and shortness of breath. Negative for wheezing.   Cardiovascular: Negative for chest pain, palpitations and leg swelling.  Gastrointestinal: Negative for abdominal pain, constipation, diarrhea, nausea and vomiting.  Genitourinary: Negative for dysuria.  Musculoskeletal: Negative for myalgias.  Neurological: Positive for headaches. Negative for dizziness, speech change, focal weakness and seizures.  Psychiatric/Behavioral: Negative for depression.    DRUG ALLERGIES:   Allergies  Allergen Reactions  . Eliquis [Apixaban] Rash    Hives, and difficulty swallowing  . Cardizem Cd [Diltiazem Hcl Er Beads] Rash  . Citrus Other (See Comments)    Burning in stomach   . Penicillins Hives, Nausea And Vomiting, Swelling and Other (See Comments)    Patient reports tolerating ampicillin.  Has patient had a PCN reaction causing immediate rash, facial/tongue/throat swelling, SOB or lightheadedness with hypotension: Yes Has patient had a PCN reaction causing severe rash involving mucus membranes or skin necrosis: unknown Has patient had a PCN reaction that required hospitalization No Has patient had a PCN reaction occurring within the last 10 years: No If all of the above answers are "NO", then may proceed with Cephalosporin use.     VITALS:    Blood pressure 117/62, pulse 100, temperature 98.6 F (37 C), temperature source Oral, resp. rate 14, height 5\' 9"  (1.753 m), weight 60 kg (132 lb 4.4 oz), SpO2 90 %.  PHYSICAL EXAMINATION:  Physical Exam  GENERAL:  67 y.o.-year-old patient lying in the bed with no acute distress.  EYES: Pupils equal, round, reactive to light and accommodation. No scleral icterus. Extraocular muscles intact.  HEENT: Head atraumatic, normocephalic. Oropharynx and nasopharynx clear.  NECK:  Supple, no jugular venous distention. No thyroid enlargement, no tenderness.  LUNGS: Normal breath sounds bilaterally, no wheezing, rales or crepitation. Decreased left basilar breath sounds with some scattered rhonchi. Nares of accessory muscles of respiration  CARDIOVASCULAR: S1, S2 normal. No murmurs, rubs, or gallops.  ABDOMEN: Soft, nontender, nondistended. Bowel sounds present. No organomegaly or mass.  EXTREMITIES: No pedal edema, cyanosis, or clubbing.  NEUROLOGIC: Cranial nerves II through XII are intact. Muscle strength 5/5 in all extremities. Sensation intact. Gait not checked.  PSYCHIATRIC: The patient is alert and oriented x 3.  SKIN: No obvious rash, lesion, or ulcer.    LABORATORY PANEL:   CBC Recent Labs  Lab 05/25/17 0525  WBC 7.8  HGB 12.2  HCT 39.0  PLT 127*   ------------------------------------------------------------------------------------------------------------------  Chemistries  Recent Labs  Lab 05/24/17 0621  05/25/17 0525  NA 142  --  140  K 4.0  --  4.2  CL 108  --  106  CO2 22  --  25  GLUCOSE 102*  --  113*  BUN 17  --  15  CREATININE 0.67   < > 0.75  CALCIUM 8.6*  --  8.5*  AST 19  --   --  ALT 9*  --   --   ALKPHOS 91  --   --   BILITOT 0.4  --   --    < > = values in this interval not displayed.   ------------------------------------------------------------------------------------------------------------------  Cardiac Enzymes Recent Labs  Lab  05/24/17 0621  TROPONINI <0.03   ------------------------------------------------------------------------------------------------------------------  RADIOLOGY:  Dg Chest 2 View  Result Date: 05/24/2017 CLINICAL DATA:  Shortness of Breath EXAM: CHEST  2 VIEW COMPARISON:  04/20/2017 FINDINGS: Cardiac shadow is stable. The right lung is clear. Diffuse left lower lobe infiltrate is noted consistent with pneumonia. No sizable effusion is seen. No bony abnormality is noted. IMPRESSION: Diffuse left lower lobe infiltrate. Electronically Signed   By: Inez Catalina M.D.   On: 05/24/2017 07:20   Ct Head W Or Wo Contrast  Result Date: 05/24/2017 CLINICAL DATA:  67 year old female with 1 week of severe headache. No known injury. 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 EXAM: CT HEAD WITHOUT AND WITH CONTRAST TECHNIQUE: Contiguous axial images were obtained from the base of the skull through the vertex without and with intravenous contrast CONTRAST:  29mL ISOVUE-300 IOPAMIDOL (ISOVUE-300) INJECTION 61% COMPARISON:  05/02/2017 head CT, brain MRI 07/24/2016 and earlier. FINDINGS: Brain: Stable cerebral volume. No midline shift, ventriculomegaly, mass effect, intracranial hemorrhage or evidence of cortically based acute infarction. No abnormal enhancement identified. Asymmetric hypodensity in the left cerebellum is new since the head CT 09/29/2015, and not correlated on the 2018 brain MRI. This is increased in conspicuity since January. There is no associated mass effect or enhancement. The caudal aspect of this area has a curvilinear morphology raising the possibility of encephalomalacia (series 2, image 5). Elsewhere stable gray-white matter differentiation throughout the brain, with chronic nonspecific cerebral white matter hypodensity. Vascular: Mild Calcified atherosclerosis at the skull base. Major intracranial vascular structures are enhancing. Skull: Bone mineralization remains stable. No acute or  suspicious osseous lesion identified. Sinuses/Orbits: Visualized paranasal sinuses and mastoids are stable and well pneumatized. Other: Visualized orbits and scalp soft tissues are within normal limits. IMPRESSION: 1. Nonspecific hypodense area in the left cerebellum has developed since 2018. No associated enhancement or mass effect to strongly suggest metastatic disease. This might be a late subacute cerebellar infarct. Recommend Brain MRI without and with contrast to further characterize. 2. No other acute finding. Electronically Signed   By: Genevie Ann M.D.   On: 05/24/2017 08:23   Mr Jeri Cos XH Contrast  Result Date: 05/24/2017 CLINICAL DATA:  Acute headache.  History of lung cancer. EXAM: MRI HEAD WITHOUT AND WITH CONTRAST TECHNIQUE: Multiplanar, multiecho pulse sequences of the brain and surrounding structures were obtained without and with intravenous contrast. CONTRAST:  37mL MULTIHANCE GADOBENATE DIMEGLUMINE 529 MG/ML IV SOLN COMPARISON:  Head CT from earlier today.  Brain MRI 07/24/2016 FINDINGS: Brain: The left inferior cerebellar abnormality reflects an enhancing superficial mass measuring up to 17 mm. The mass splays folia but is still likely intra-axial. No second lesion is seen. No infarct, hemorrhage, hydrocephalus, or collection. Chronic white matter disease, usually microvascular ischemic. Vascular: 3 mm enhancing outpouching extending inferiorly from the right MCA bifurcation. Skull and upper cervical spine: No noted metastatic lesion. Sinuses/Orbits: No visible marrow lesion. Other: These results were called by telephone at the time of interpretation on 05/24/2017 at 10:46 am to Dr. Vaughan Basta , who verbally acknowledged these results. IMPRESSION: 1. 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 2. 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 At follow-up  brain MRI a MRA is recommended. Electronically Signed   By: Monte Fantasia M.D.   On: 05/24/2017 11:11     EKG:   Orders placed or performed during the hospital encounter of 05/24/17  . EKG 12-Lead  . EKG 12-Lead    ASSESSMENT AND PLAN:   67 year old female with past medical history significant for CLL, small cell lung cancer diagnosed in 2018 finished chemotherapy, COPD, atrial fibrillation presents to hospital secondary to headache and worsening shortness of breath.  1. Left lower lobe pneumonia-community-acquired -Not on home oxygen, currently using 2 L. Wean as tolerated -Follow up sputum cultures. Flu test is negative. -Continue Levaquin -On vancomycin, MRSA PCR ordered. If negative-discontinue vancomycin  2. Headaches-CT of the head showing possible metastatic lesion and MRI of the brain done confirming 17 mm left inferior cerebellar mass with minimal edema. -Appreciate oncology consult. -Radiation oncology consult this Monday. Started on Decadron twice a day  3. COPD-stable, continue inhalers.  4. GERD-Protonix  5. DVT prophylaxis-subcutaneous heparin    All the records are reviewed and case discussed with Care Management/Social Workerr. Management plans discussed with the patient, family and they are in agreement.  CODE STATUS: DO NOT RESUSCITATE  TOTAL TIME TAKING CARE OF THIS PATIENT: 38 minutes.   POSSIBLE D/C IN 2-3 DAYS, DEPENDING ON CLINICAL CONDITION.   Danelle Curiale M.D on 05/25/2017 at 2:38 PM  Between 7am to 6pm - Pager - [phone]  After 6pm go to [email or web address] - password [email or web address] [facility]  Office  [phone]  CC: Primary care physician; Alvester Chou, NP

## 2017-05-25 NOTE — Plan of Care (Signed)
  Progressing Education: Knowledge of General Education information will improve 05/25/2017 1717 - Progressing by Cherylann Parr, RN Activity: Ability to tolerate increased activity will improve 05/25/2017 1717 - Progressing by Cherylann Parr, RN Clinical Measurements: Ability to maintain a body temperature in the normal range will improve 05/25/2017 1717 - Progressing by Cherylann Parr, RN Respiratory: Ability to maintain adequate ventilation will improve 05/25/2017 1717 - Progressing by Cherylann Parr, RN Ability to maintain a clear airway will improve 05/25/2017 1717 - Progressing by Cherylann Parr, RN

## 2017-05-26 LAB — BASIC METABOLIC PANEL
ANION GAP: 8 (ref 5–15)
BUN: 19 mg/dL (ref 6–20)
CALCIUM: 9.1 mg/dL (ref 8.9–10.3)
CO2: 28 mmol/L (ref 22–32)
Chloride: 102 mmol/L (ref 101–111)
Creatinine, Ser: 0.67 mg/dL (ref 0.44–1.00)
GFR calc non Af Amer: >60 mL/min (ref >60)
GLUCOSE: 123 mg/dL — AB (ref 65–99)
Potassium: 4.5 mmol/L (ref 3.5–5.1)
Sodium: 138 mmol/L (ref 135–145)

## 2017-05-26 LAB — VANCOMYCIN, TROUGH: Vancomycin Tr: 10 ug/mL — ABNORMAL LOW (ref 15–20)

## 2017-05-26 MED ORDER — VANCOMYCIN HCL IN DEXTROSE 1-5 GM/200ML-% IV SOLN
1000.0000 mg | Freq: Two times a day (BID) | INTRAVENOUS | Status: DC
  Filled 2017-05-26: qty 200

## 2017-05-26 NOTE — Plan of Care (Signed)
  Progressing Education: Knowledge of General Education information will improve 05/26/2017 1443 - Progressing by Cherylann Parr, RN Activity: Ability to tolerate increased activity will improve 05/26/2017 1443 - Progressing by Cherylann Parr, RN Clinical Measurements: Ability to maintain a body temperature in the normal range will improve 05/26/2017 1443 - Progressing by Cherylann Parr, RN Respiratory: Ability to maintain adequate ventilation will improve 05/26/2017 1443 - Progressing by Cherylann Parr, RN Ability to maintain a clear airway will improve 05/26/2017 1443 - Progressing by Cherylann Parr, RN

## 2017-05-26 NOTE — Progress Notes (Signed)
ANTIBIOTIC CONSULT NOTE - INITIAL  Pharmacy Consult for levofloxacin and vancomycin Indication: PNA/CAP  Allergies  Allergen Reactions  . Eliquis [Apixaban] Rash    Hives, and difficulty swallowing  . Cardizem Cd [Diltiazem Hcl Er Beads] Rash  . Citrus Other (See Comments)    Burning in stomach   . Penicillins Hives, Nausea And Vomiting, Swelling and Other (See Comments)    Patient reports tolerating ampicillin.  Has patient had a PCN reaction causing immediate rash, facial/tongue/throat swelling, SOB or lightheadedness with hypotension: Yes Has patient had a PCN reaction causing severe rash involving mucus membranes or skin necrosis: unknown Has patient had a PCN reaction that required hospitalization No Has patient had a PCN reaction occurring within the last 10 years: No If all of the above answers are "NO", then may proceed with Cephalosporin use.     Patient Measurements: Height: 5\' 9"  (175.3 cm) Weight: 132 lb 4.4 oz (60 kg) IBW/kg (Calculated) : 66.2 Adjusted Body Weight:   Vital Signs: Temp: 98.2 F (36.8 C) (02/10 0501) Temp Source: Oral (02/10 0501) BP: 99/60 (02/10 0501) Pulse Rate: 86 (02/10 0501) Intake/Output from previous day: 02/09 0701 - 02/10 0700 In: 240 [P.O.:240] Out: -  Intake/Output from this shift: No intake/output data recorded.  Labs: Recent Labs    05/24/17 0621 05/24/17 1523 05/25/17 0525 05/26/17 0446  WBC 19.5* 12.3* 7.8  --   HGB 13.5 13.0 12.2  --   PLT 173 148* 127*  --   CREATININE 0.67 0.76 0.75 0.67   Estimated Creatinine Clearance: 64.6 mL/min (by C-G formula based on SCr of 0.67 mg/dL). Recent Labs    05/26/17 0446  [place] 10*     Microbiology: Recent Results (from the past 720 hour(s))  Blood Culture (routine x 2)     Status: None (Preliminary result)   Collection Time: 05/24/17  7:40 AM  Result Value Ref Range Status   Specimen Description BLOOD BLOOD LEFT FOREARM  Final   Special Requests   Final   BOTTLES DRAWN AEROBIC AND ANAEROBIC Blood Culture adequate volume   Culture   Final    NO GROWTH < 24 HOURS Performed at [facility], [address] [place], [place] [address]    Report Status PENDING  Incomplete  Blood Culture (routine x 2)     Status: None (Preliminary result)   Collection Time: 05/24/17  7:40 AM  Result Value Ref Range Status   Specimen Description BLOOD BLOOD RIGHT HAND  Final   Special Requests   Final    BOTTLES DRAWN AEROBIC AND ANAEROBIC Blood Culture adequate volume   Culture   Final    NO GROWTH < 24 HOURS Performed at [facility], [address], [place], [place] [address]    Report Status PENDING  Incomplete  Urine culture     Status: Abnormal   Collection Time: 05/24/17 10:32 AM  Result Value Ref Range Status   Specimen Description   Final    URINE, RANDOM Performed at [facility], [address], [place], [place] [address]    Special Requests   Final    NONE Performed at [facility], [address] [place], [place] [address]    Culture MULTIPLE SPECIES PRESENT, SUGGEST RECOLLECTION (A)  Final   Report Status 05/25/2017 FINAL  Final  MRSA PCR Screening     Status: Abnormal   Collection Time: 05/25/17 12:37 PM  Result Value Ref Range Status   MRSA by PCR POSITIVE (A)  NEGATIVE Final    Comment:        The GeneXpert MRSA Assay (FDA approved for NASAL specimens only), is one component of a comprehensive MRSA colonization surveillance program. It is not intended to diagnose MRSA infection nor to guide or monitor treatment for MRSA infections. RESULT CALLED TO, READ BACK BY AND VERIFIED WITH: AMANDA PEREZ AT 1558 ON 05/25/17 BY SNJ Performed at [facility], [address] [place], [place] [address]     Medical History: Past Medical History:  Diagnosis Date  . Allergy   . 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 Anxiety   . Asthma   . Atrial fibrillation  ([place]) 04/2016  . Bronchospasm   . Cerebrovascular accident (CVA) ([place]) 08/28/2015  . Chronic lymphocytic leukemia ([place]) 2004  . COPD (chronic obstructive pulmonary disease) ([place])   . Depression   . Emphysema of lung ([place])   . Frontal sinusitis November 2012  . H/O viral encephalitis   . Headache(784.0)   . Heart murmur   . Mitral prolapse 11/24/2015  . Mitral regurgitation 11/24/2015  . Olecranon bursitis of left elbow November 2012.   in past  . Pleurisy   . Pneumonia    13 times  . Rhinitis   . Thrombocytopenia ([place])   . Tobacco abuse   . Tricuspid valve regurgitation 11/24/2015    Medications:  Infusions:  . levofloxacin (LEVAQUIN) IV Stopped (05/25/17 0930)  . vancomycin     Assessment: 65 yof cc headache with PMH AF, COPD on O2, lung cancer, leukemia, no current chemi. Yellow sputum with no fever, AF RVR noted, PNA on CXR with high WBC. Influenza negative. Pharmacy consulted to dose levofloxacin and vancomycin for PNA CAP.  Goal of Therapy:  Resolve infection  Prevent ADE Vancomycin trough 15 to 20 mcg/mL  Plan:  1. Levofloxacin 750 mg IV Q24H 2. Vancomycin 1000 mg IV x 1 in ED followed in approximately 10 hours (stacked dosing) by vancomycin 750 mg IV Q12H, predicted trough 19 mcg/mL. Pharmacy will continue to follow and adjust as needed to maintain trough 15 to 20 mcg/mL.   2/10 AM vanc level 10. Changed to 1 gram q 12 hours. Level before 3rd new dose.  Vd 41.3 L, Ke 0.057 hr-1, T1/2 12 hr  Ludene Stokke S, Pharm.D., BCPS Clinical Pharmacist 05/26/2017,6:17 AM

## 2017-05-26 NOTE — Progress Notes (Signed)
[place] [facility] [place] NAME: Sheri Ayala    MR#:  [id number]  DATE OF BIRTH:  [birth year]  SUBJECTIVE:  CHIEF COMPLAINT:   Chief Complaint  Patient presents with  . 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 Noted to have left lower lobe pneumonia and metastatic lesion in the left cerebellum - headache is some better today  REVIEW OF SYSTEMS:  Review of Systems  Constitutional: Negative for chills, fever and malaise/fatigue.  HENT: Negative for congestion, ear discharge, hearing loss and nosebleeds.   Eyes: Negative for blurred vision and double vision.  Respiratory: Positive for cough and shortness of breath. Negative for wheezing.   Cardiovascular: Negative for chest pain, palpitations and leg swelling.  Gastrointestinal: Negative for abdominal pain, constipation, diarrhea, nausea and vomiting.  Genitourinary: Negative for dysuria.  Musculoskeletal: Negative for myalgias.  Neurological: Positive for headaches. Negative for dizziness, speech change, focal weakness and seizures.  Psychiatric/Behavioral: Negative for depression.    DRUG ALLERGIES:   Allergies  Allergen Reactions  . Eliquis [Apixaban] Rash    Hives, and difficulty swallowing  . Cardizem Cd [Diltiazem Hcl Er Beads] Rash  . Citrus Other (See Comments)    Burning in stomach   . Penicillins Hives, Nausea And Vomiting, Swelling and Other (See Comments)    Patient reports tolerating ampicillin.  Has patient had a PCN reaction causing immediate rash, facial/tongue/throat swelling, SOB or lightheadedness with hypotension: Yes Has patient had a PCN reaction causing severe rash involving mucus membranes or skin necrosis: unknown Has patient had a PCN reaction that required hospitalization No Has patient had a PCN reaction occurring within the last 10 years: No If all of the above answers are "NO", then may proceed with  Cephalosporin use.     VITALS:  Blood pressure 99/60, pulse 86, temperature 98.2 F (36.8 C), temperature source Oral, resp. rate 18, height 5\' 9"  (1.753 m), weight 60 kg (132 lb 4.4 oz), SpO2 91 %.  PHYSICAL EXAMINATION:  Physical Exam  GENERAL:  67 y.o.-year-old patient lying in the bed with no acute distress.  EYES: Pupils equal, round, reactive to light and accommodation. No scleral icterus. Extraocular muscles intact.  HEENT: Head atraumatic, normocephalic. Oropharynx and nasopharynx clear.  NECK:  Supple, no jugular venous distention. No thyroid enlargement, no tenderness.  LUNGS: Normal breath sounds bilaterally, no wheezing, rales or crepitation. Decreased left basilar breath sounds with some scattered rhonchi. Nares of accessory muscles of respiration  CARDIOVASCULAR: S1, S2 normal. No murmurs, rubs, or gallops.  ABDOMEN: Soft, nontender, nondistended. Bowel sounds present. No organomegaly or mass.  EXTREMITIES: No pedal edema, cyanosis, or clubbing.  NEUROLOGIC: Cranial nerves II through XII are intact. Muscle strength 5/5 in all extremities. Sensation intact. Gait not checked.  PSYCHIATRIC: The patient is alert and oriented x 3.  SKIN: No obvious rash, lesion, or ulcer.    LABORATORY PANEL:   CBC Recent Labs  Lab 05/25/17 0525  WBC 7.8  HGB 12.2  HCT 39.0  PLT 127*   ------------------------------------------------------------------------------------------------------------------  Chemistries  Recent Labs  Lab 05/24/17 0621  05/26/17 0446  NA 142   < > 138  K 4.0   < > 4.5  CL 108   < > 102  CO2 22   < > 28  GLUCOSE 102*   < > 123*  BUN 17   < > 19  CREATININE 0.67   < > 0.67  CALCIUM 8.6*   < > 9.1  AST 19  --   --   ALT 9*  --   --   ALKPHOS 91  --   --   BILITOT 0.4  --   --    < > = values in this interval not displayed.   ------------------------------------------------------------------------------------------------------------------  Cardiac  Enzymes Recent Labs  Lab 05/24/17 0621  TROPONINI <0.03   ------------------------------------------------------------------------------------------------------------------  RADIOLOGY:  No results found.  EKG:   Orders placed or performed during the hospital encounter of 05/24/17  . EKG 12-Lead  . EKG 12-Lead    ASSESSMENT AND PLAN:   67 year old female with past medical history significant for CLL, small cell lung cancer diagnosed in 2018 finished chemotherapy, COPD, atrial fibrillation presents to hospital secondary to headache and worsening shortness of breath.  1. Left lower lobe pneumonia-community-acquired -Not on home oxygen, but has prn o2 at home.  currently using 1 L. Wean as tolerated -Follow up sputum cultures. Flu test is negative. -Continue Levaquin -positive MRSA PCR ordered. Negative blood cultures, can discontinue vancomycin. Will add mupirocin to the nares  2. Headaches-CT of the head showing possible metastatic lesion and MRI of the brain done confirming 17 mm left inferior cerebellar mass with minimal edema. -Appreciate oncology consult. -Radiation oncology consult this Monday.  on Decadron twice a day  3. COPD-stable, continue inhalers.  4. GERD-Protonix  5. DVT prophylaxis-subcutaneous heparin  Patient is ambulatory  All the records are reviewed and case discussed with Care Management/Social Workerr. Management plans discussed with the patient, family and they are in agreement.  CODE STATUS: DO NOT RESUSCITATE  TOTAL TIME TAKING CARE OF THIS PATIENT: 37 minutes.   POSSIBLE D/C IN 1-2 DAYS, DEPENDING ON CLINICAL CONDITION.   Senaida Chilcote M.D on 05/26/2017 at 11:23 AM  Between 7am to 6pm - Pager - [phone]  After 6pm go to [email or web address] - password [email or web address] [facility]  Office  [phone]  CC: Primary care physician; Alvester Chou, NP

## 2017-05-27 ENCOUNTER — Institutional Professional Consult (permissible substitution): Payer: Medicare HMO | Admitting: Radiation Oncology

## 2017-05-27 MED ORDER — LEVOFLOXACIN 750 MG PO TABS
750.0000 mg | ORAL_TABLET | Freq: Every day | ORAL | Status: DC
Start: 2017-05-27 — End: 2017-05-27
  Administered 2017-05-27: 09:00:00 750 mg via ORAL
  Filled 2017-05-27: qty 1

## 2017-05-27 MED ORDER — LEVOFLOXACIN 750 MG PO TABS: 750.0000 mg | ORAL_TABLET | Freq: Every day | ORAL | 0 refills | Status: AC

## 2017-05-27 MED ORDER — DEXAMETHASONE 4 MG PO TABS: 4.0000 mg | ORAL_TABLET | Freq: Two times a day (BID) | ORAL | 0 refills | Status: DC

## 2017-05-27 NOTE — Progress Notes (Signed)
MD making rounds. Order received to discharge home. IV's removed. Prescriptions E-scribed to pharmacy. Discharge paperwork provided, explained, signed and witnessed. No unanswered questions. Discharged via wheelchair with nursing staff. Belongings sent with patient and family.

## 2017-05-27 NOTE — Consult Note (Signed)
NEW PATIENT EVALUATION  Name: Sheri Ayala  MRN: [id number]  Date:   05/24/2017     DOB: 1950/05/14   This 67 y.o. 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  REFERRING PHYSICIAN: No ref. provider found  CHIEF COMPLAINT:  Chief Complaint  Patient presents with  . Headache    DIAGNOSIS: The primary encounter diagnosis was Pneumonia of left lower lobe due to infectious organism [facility]). Diagnoses of Acute nonintractable headache, unspecified headache type, Sepsis, due to unspecified organism [facility]), and Brain metastases [facility]) were also pertinent to this visit.   PREVIOUS INVESTIGATIONS:  MRI scan of brain and CT scan of chest reviewed Pathology reports reviewed Clinical notes reviewed  HPI: Patient is a 67 year old female with extensive stage small cell lung cancer originally diagnosed in April 2018. She initially presented with left lung mass mediastinal adenopathy and bone metastasis. She's been treated in [place] receiving 6 cycles of carboplatinum etoposide. She had an episode of being unresponsive back in early January with CT scan of the head without contrast showing no evidence of abnormality. She presented over the weekend with right-sided headache and head CT scan on February 8 showed hyperdense area in the left cerebellum which was new since previous exams. MRI scan confirmed a 1.7 cm inferior cerebellar mass consistent with metastatic disease with mild edema. Her chest x-ray showed diffuse left lower lobe infiltrate consistent with pneumonia and she has been started on vancomycin and Levaquin I been asked to evaluate her for possible brain radiation. She seen today in her hospital room is alert oriented and doing well she has responded well to use steroids. She still states she is a little unsteady on her feet which is expected. She has no change in visual fields.  PLANNED TREATMENT REGIMEN: Whole brain  radiation with cerebellar boost  PAST MEDICAL HISTORY:  has a past medical history of Allergy, Aneurysm of middle cerebral artery (05/24/2017), Anxiety, Asthma, Atrial fibrillation ([place]) (04/2016), Bronchospasm, Cerebrovascular accident (CVA) ([place]) (08/28/2015), Chronic lymphocytic leukemia ([place]) (2004), COPD (chronic obstructive pulmonary disease) ([place]), Depression, Emphysema of lung ([place]), Frontal sinusitis (November 2012), H/O viral encephalitis, Headache(784.0), Heart murmur, Mitral prolapse (11/24/2015), Mitral regurgitation (11/24/2015), Olecranon bursitis of left elbow (November 2012.), Pleurisy, Pneumonia, Rhinitis, Thrombocytopenia ([place]), Tobacco abuse, and Tricuspid valve regurgitation (11/24/2015).    PAST SURGICAL HISTORY:  Past Surgical History:  Procedure Laterality Date  . ABDOMINAL HYSTERECTOMY    . COLONOSCOPY    . UPPER GASTROINTESTINAL ENDOSCOPY    . VIDEO BRONCHOSCOPY Bilateral 04/18/2016   Procedure: VIDEO BRONCHOSCOPY WITHOUT FLUORO;  Surgeon: Tanda Rockers, MD;  Location: [facility] ENDOSCOPY;  Service: Cardiopulmonary;  Laterality: Bilateral;  . VIDEO BRONCHOSCOPY WITH ENDOBRONCHIAL ULTRASOUND N/A 07/23/2016   Procedure: VIDEO BRONCHOSCOPY WITH ENDOBRONCHIAL ULTRASOUND;  Surgeon: Grace Isaac, MD;  Location: [facility] OR;  Service: Thoracic;  Laterality: N/A;    FAMILY HISTORY: family history includes Aneurysm in her mother; Congestive Heart Failure in her brother; Heart disease in her brother; Liver disease in her father.  SOCIAL HISTORY:  reports that she has quit smoking. She smoked 0.00 packs per day for 45.00 years. she has never used smokeless tobacco. She reports that she does not drink alcohol or use drugs.  ALLERGIES: Eliquis [apixaban]; Cardizem cd [diltiazem hcl er beads]; Citrus; and Penicillins  MEDICATIONS:  Current Facility-Administered Medications  Medication Dose Route Frequency Provider Last Rate Last Dose  . albuterol (PROVENTIL) (2.5 MG/3ML) 0.083% nebulizer  solution 2.5 mg  2.5 mg Nebulization Q4H PRN Vaughan Basta, MD      . ALPRAZolam Duanne Moron) tablet 0.5 mg  0.5 mg Oral BID PRN Vaughan Basta, MD   0.5 mg at 05/27/17 0555  . Chlorhexidine Gluconate Cloth 2 % PADS 6 each  6 each Topical Q0600 Gladstone Lighter, MD   6 each at 05/27/17 0531  . dexamethasone (DECADRON) tablet 4 mg  4 mg Oral Q12H Lequita Asal, MD   4 mg at 05/27/17 0917  . docusate sodium (COLACE) capsule 100 mg  100 mg Oral BID PRN Vaughan Basta, MD      . feeding supplement (ENSURE ENLIVE) (ENSURE ENLIVE) liquid 237 mL  237 mL Oral BID BM Vaughan Basta, MD   237 mL at 05/27/17 0923  . heparin injection 5,000 Units  5,000 Units Subcutaneous Q8H Vaughan Basta, MD   5,000 Units at 05/27/17 0554  . HYDROcodone-acetaminophen (NORCO/VICODIN) 5-325 MG per tablet 1 tablet  1 tablet Oral Q6H PRN Vaughan Basta, MD   1 tablet at 05/27/17 0555  . levofloxacin (LEVAQUIN) tablet 750 mg  750 mg Oral Daily Marcelle Overlie D, RPH   750 mg at 05/27/17 0916  . mometasone-formoterol (DULERA) 200-5 MCG/ACT inhaler 2 puff  2 puff Inhalation BID Vaughan Basta, MD   2 puff at 05/27/17 0920  . mupirocin ointment (BACTROBAN) 2 % 1 application  1 application Nasal BID Gladstone Lighter, MD   1 application at 97/67/34 [phone]  . pantoprazole (PROTONIX) EC tablet 40 mg  40 mg Oral Daily Vaughan Basta, MD   40 mg at 05/27/17 0916  . sertraline (ZOLOFT) tablet 25 mg  25 mg Oral QHS Vaughan Basta, MD   25 mg at 05/26/17 1940  . SUMAtriptan (IMITREX) tablet 50 mg  50 mg Oral Q2H PRN Vaughan Basta, MD   50 mg at 05/24/17 1423  . temazepam (RESTORIL) capsule 30 mg  30 mg Oral QHS Vaughan Basta, MD   30 mg at 05/26/17 1938  . topiramate (TOPAMAX) tablet 50 mg  50 mg Oral Daily Vaughan Basta, MD   50 mg at 05/27/17 0916    ECOG PERFORMANCE STATUS:  1 - Symptomatic but completely ambulatory  REVIEW OF  SYSTEMS: Except for this unsteadiness on her feet cough Patient denies any weight loss, fatigue, weakness, fever, chills or night sweats. Patient denies any loss of vision, blurred vision. Patient denies any ringing  of the ears or hearing loss. No irregular heartbeat. Patient denies heart murmur or history of fainting. Patient denies any chest pain or pain radiating to her upper extremities. Patient denies any shortness of breath, difficulty breathing at night, cough or hemoptysis. Patient denies any swelling in the lower legs. Patient denies any nausea vomiting, vomiting of blood, or coffee ground material in the vomitus. Patient denies any stomach pain. Patient states has had normal bowel movements no significant constipation or diarrhea. Patient denies any dysuria, hematuria or significant nocturia. Patient denies any problems walking, swelling in the joints or loss of balance. Patient denies any skin changes, loss of hair or loss of weight. Patient denies any excessive worrying or anxiety or significant depression. Patient denies any problems with insomnia. Patient denies excessive thirst, polyuria, polydipsia. Patient denies any swollen glands, patient denies easy bruising or easy bleeding. Patient denies any recent infections, allergies or URI. Patient "s visual fields have not changed significantly in recent time.    PHYSICAL EXAM: BP 117/66 (BP Location: Left Arm)   Pulse 93  Temp 98.3 F (36.8 C)   Resp 18   Ht 5\' 9"  (1.753 m)   Wt 132 lb 4.4 oz (60 kg)   SpO2 90%   BMI 19.53 kg/m  Crude visual fields are within normal range. Proprioception is intact. Motor sensory and DTR levels are equal and symmetric in the upper lower extremities. Well-developed well-nourished patient in NAD. HEENT reveals PERLA, EOMI, discs not visualized.  Oral cavity is clear. No oral mucosal lesions are identified. Neck is clear without evidence of cervical or supraclavicular adenopathy. Lungs are clear to A&P.  Cardiac examination is essentially unremarkable with regular rate and rhythm without murmur rub or thrill. Abdomen is benign with no organomegaly or masses noted. Motor sensory and DTR levels are equal and symmetric in the upper and lower extremities. Cranial nerves II through XII are grossly intact. Proprioception is intact. No peripheral adenopathy or edema is identified. No motor or sensory levels are noted. Crude visual fields are within normal range.  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 I would plan on delivering 3000 cGy in 12 fractions boosting the cerebellar metastasis another 2000 cGy using I MRT radiation therapy and the hypofractionated regiment. Risks and benefits of treatment including hair loss fatigue alteration of blood counts possible cognitive decline all were discussed in detail with the patient and her son who was on the phone during my interview. Little surprised patient does not receive PCI in the past although with whole brain radiation therapy that will alleviate that need and control the entire brain which most likely has microscopic metastatic disease present. Patient comprehends my treatment plan well. I have personally set up and ordered CT simulation for later this week. She will continue on her steroids at the present time. Patient is to be discharged today.  I would like to take this opportunity to thank you for allowing me to participate in the care of your patient.Noreene Filbert, MD

## 2017-05-27 NOTE — Care Management Important Message (Signed)
Important Message  Patient Details  Name: Sheri Ayala MRN: [id number] Date of Birth: [birth year]   Medicare Important Message Given:  Yes    Shelbie Ammons, RN 05/27/2017, 7:08 AM

## 2017-05-27 NOTE — Plan of Care (Signed)
  Education: Knowledge of General Education information will improve 05/27/2017 0215 - Progressing by Jeffie Pollock, RN   Activity: Ability to tolerate increased activity will improve 05/27/2017 0215 - Progressing by Jeffie Pollock, RN   Clinical Measurements: Ability to maintain a body temperature in the normal range will improve 05/27/2017 0215 - Progressing by Jeffie Pollock, RN   Respiratory: Ability to maintain adequate ventilation will improve 05/27/2017 0215 - Progressing by Jeffie Pollock, RN Ability to maintain a clear airway will improve 05/27/2017 0215 - Progressing by Jeffie Pollock, RN

## 2017-05-27 NOTE — Discharge Summary (Signed)
[facility] NAME: Sheri Ayala    MR#:  [id number]  [place]:  [birth year]  DATE OF ADMISSION:  05/24/2017   ADMITTING PHYSICIAN: Vaughan Basta, MD  DATE OF DISCHARGE: 05/27/2017 12:06 PM  PRIMARY CARE PHYSICIAN: Alvester Chou, NP   ADMISSION DIAGNOSIS:   Sepsis, due to unspecified organism ([place]) [A41.9] Acute nonintractable headache, unspecified headache type [R51] Pneumonia of left lower lobe due to infectious organism ([place]) [J18.1]  DISCHARGE DIAGNOSIS:   Active Problems:   Acute on chronic respiratory failure with hypoxia ([place])   Community acquired pneumonia   Acute on chronic respiratory failure ([place])   Brain metastases ([place])   SECONDARY DIAGNOSIS:   Past Medical History:  Diagnosis Date  . Allergy   . 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 Anxiety   . Asthma   . Atrial fibrillation ([place]) 04/2016  . Bronchospasm   . Cerebrovascular accident (CVA) ([place]) 08/28/2015  . Chronic lymphocytic leukemia ([place]) 2004  . COPD (chronic obstructive pulmonary disease) ([place])   . Depression   . Emphysema of lung ([place])   . Frontal sinusitis November 2012  . H/O viral encephalitis   . Headache(784.0)   . Heart murmur   . Mitral prolapse 11/24/2015  . Mitral regurgitation 11/24/2015  . Olecranon bursitis of left elbow November 2012.   in past  . Pleurisy   . Pneumonia    13 times  . Rhinitis   . Thrombocytopenia ([place])   . Tobacco abuse   . Tricuspid valve regurgitation 11/24/2015    HOSPITAL COURSE:   67 year old female with past medical history significant for CLL, small cell lung cancer diagnosed in 2018 finished chemotherapy, COPD, atrial fibrillation presents to hospital secondary to headache and worsening shortness of breath.  1. Left lower lobe pneumonia-community-acquired -has prn o2 at home.  has been stable -Flu test is negative. -on Levaquin- finish off the  course -positive MRSA PCR. Negative blood cultures  2. Headaches-CT of the head showing possible metastatic lesion and MRI of the brain done confirming 17 mm left inferior cerebellar mass with minimal edema. -Appreciate oncology consult and radiation oncology consult. -Stereotactic radiation will be started in a couple of days as outpatient. Appointment given -on Decadron twice a day. Improving headaches -Known history of small cell lung cancer status post treatment. Outpatient follow-up with oncology  3. COPD-stable, continue inhalers.  4. GERD-Protonix  Patient has been ambulatory, will be discharged today    DISCHARGE CONDITIONS:   Guarded  CONSULTS OBTAINED:   Treatment Team:  Lequita Asal, MD Noreene Filbert, MD  DRUG ALLERGIES:   Allergies  Allergen Reactions  . Eliquis [Apixaban] Rash    Hives, and difficulty swallowing  . Cardizem Cd [Diltiazem Hcl Er Beads] Rash  . Citrus Other (See Comments)    Burning in stomach   . Penicillins Hives, Nausea And Vomiting, Swelling and Other (See Comments)    Patient reports tolerating ampicillin.  Has patient had a PCN reaction causing immediate rash, facial/tongue/throat swelling, SOB or lightheadedness with hypotension: Yes Has patient had a PCN reaction causing severe rash involving mucus membranes or skin necrosis: unknown Has patient had a PCN reaction that required hospitalization No Has patient had a PCN reaction occurring within the last 10 years: No If all of the above answers are "NO", then may proceed with Cephalosporin use.    DISCHARGE MEDICATIONS:   Allergies as of 05/27/2017  Reactions   Eliquis [apixaban] Rash   Hives, and difficulty swallowing   Cardizem Cd [diltiazem Hcl Er Beads] Rash   Citrus Other (See Comments)   Burning in stomach    Penicillins Hives, Nausea And Vomiting, Swelling, Other (See Comments)   Patient reports tolerating ampicillin. Has patient had a PCN reaction  causing immediate rash, facial/tongue/throat swelling, SOB or lightheadedness with hypotension: Yes Has patient had a PCN reaction causing severe rash involving mucus membranes or skin necrosis: unknown Has patient had a PCN reaction that required hospitalization No Has patient had a PCN reaction occurring within the last 10 years: No If all of the above answers are "NO", then may proceed with Cephalosporin use.      Medication List    TAKE these medications   albuterol (2.5 MG/3ML) 0.083% nebulizer solution Commonly known as:  PROVENTIL Take 3 mLs (2.5 mg total) by nebulization every 4 (four) hours as needed for wheezing or shortness of breath.   VENTOLIN HFA 108 (90 Base) MCG/ACT inhaler Generic drug:  albuterol Inhale 2 puffs into the lungs every 4 (four) hours as needed for wheezing or shortness of breath.   ALPRAZolam 0.5 MG tablet Commonly known as:  XANAX Take 1 tablet (0.5 mg total) by mouth 2 (two) times daily as needed for anxiety or sleep.   budesonide-formoterol 160-4.5 MCG/ACT inhaler Commonly known as:  SYMBICORT Take 2 puffs first thing in am and then another 2 puffs about 12 hours later.   dexamethasone 4 MG tablet Commonly known as:  DECADRON Take 1 tablet (4 mg total) by mouth every 12 (twelve) hours.   feeding supplement (ENSURE ENLIVE) Liqd Take 237 mLs by mouth 2 (two) times daily between meals.   HYDROcodone-acetaminophen 5-325 MG tablet Commonly known as:  NORCO Take 1 tablet by mouth every 6 (six) hours as needed for severe pain.   levofloxacin 750 MG tablet Commonly known as:  LEVAQUIN Take 1 tablet (750 mg total) by mouth daily for 4 days. X 4 more days Start taking on:  05/28/2017   OXYGEN Inhale 2 L into the lungs as needed (for shortness of breath).   pantoprazole 40 MG tablet Commonly known as:  PROTONIX Take 40 mg daily by mouth.   polyethylene glycol packet Commonly known as:  MIRALAX / GLYCOLAX Take 17 g by mouth 2 (two) times  daily.   prochlorperazine 10 MG tablet Commonly known as:  COMPAZINE Take 0.5 tablets (5 mg total) by mouth every 6 (six) hours as needed for nausea or vomiting.   rizatriptan 10 MG tablet Commonly known as:  MAXALT Take 10 mg by mouth as needed.   sertraline 25 MG tablet Commonly known as:  ZOLOFT Take 25 mg by mouth at bedtime.   temazepam 30 MG capsule Commonly known as:  RESTORIL Take 1 capsule (30 mg total) by mouth at bedtime.   topiramate 50 MG tablet Commonly known as:  TOPAMAX Take 1 tablet by mouth daily.        DISCHARGE INSTRUCTIONS:   1. Radiation oncology follow-up in 2 days 2. Oncology follow-up in 1 week 3. PCP follow-up in 1-2 weeks  DIET:   Regular diet  ACTIVITY:   Activity as tolerated  OXYGEN:   Home Oxygen: Yes.    Oxygen Delivery: 2 liters/min via Patient connected to nasal cannula oxygen as needed  DISCHARGE LOCATION:   home   If you experience worsening of your admission symptoms, develop shortness of breath, life threatening emergency, suicidal or  homicidal thoughts you must seek medical attention immediately by calling 911 or calling your MD immediately  if symptoms less severe.  You Must read complete instructions/literature along with all the possible adverse reactions/side effects for all the Medicines you take and that have been prescribed to you. Take any new Medicines after you have completely understood and accpet all the possible adverse reactions/side effects.   Please note  You were cared for by a hospitalist during your hospital stay. If you have any questions about your discharge medications or the care you received while you were in the hospital after you are discharged, you can call the unit and asked to speak with the hospitalist on call if the hospitalist that took care of you is not available. Once you are discharged, your primary care physician will handle any further medical issues. Please note that NO REFILLS for  any discharge medications will be authorized once you are discharged, as it is imperative that you return to your primary care physician (or establish a relationship with a primary care physician if you do not have one) for your aftercare needs so that they can reassess your need for medications and monitor your lab values.    On the day of Discharge:  VITAL SIGNS:   Blood pressure 117/66, pulse 93, temperature 98.3 F (36.8 C), resp. rate 18, height 5\' 9"  (1.753 m), weight 60 kg (132 lb 4.4 oz), SpO2 90 %.  PHYSICAL EXAMINATION:     GENERAL:  68 y.o.-year-old patient lying in the bed with no acute distress.  EYES: Pupils equal, round, reactive to light and accommodation. No scleral icterus. Extraocular muscles intact.  HEENT: Head atraumatic, normocephalic. Oropharynx and nasopharynx clear.  NECK:  Supple, no jugular venous distention. No thyroid enlargement, no tenderness.  LUNGS: Normal breath sounds bilaterally, no wheezing, rales or crepitation. Decreased left basilar breath sounds with some scattered rhonchi. Nares of accessory muscles of respiration  CARDIOVASCULAR: S1, S2 normal. No murmurs, rubs, or gallops.  ABDOMEN: Soft, nontender, nondistended. Bowel sounds present. No organomegaly or mass.  EXTREMITIES: No pedal edema, cyanosis, or clubbing.  NEUROLOGIC: Cranial nerves II through XII are intact. Muscle strength 5/5 in all extremities. Sensation intact. Gait not checked.  PSYCHIATRIC: The patient is alert and oriented x 3.  SKIN: No obvious rash, lesion, or ulcer.      DATA REVIEW:   CBC Recent Labs  Lab 05/25/17 0525  WBC 7.8  HGB 12.2  HCT 39.0  PLT 127*    Chemistries  Recent Labs  Lab 05/24/17 0621  05/26/17 0446  NA 142   < > 138  K 4.0   < > 4.5  CL 108   < > 102  CO2 22   < > 28  GLUCOSE 102*   < > 123*  BUN 17   < > 19  CREATININE 0.67   < > 0.67  CALCIUM 8.6*   < > 9.1  AST 19  --   --   ALT 9*  --   --   ALKPHOS 91  --   --   BILITOT 0.4   --   --    < > = values in this interval not displayed.     Microbiology Results  Results for orders placed or performed during the hospital encounter of 05/24/17  Blood Culture (routine x 2)     Status: None (Preliminary result)   Collection Time: 05/24/17  7:40 AM  Result Value Ref Range Status  Specimen Description BLOOD BLOOD LEFT FOREARM  Final   Special Requests   Final    BOTTLES DRAWN AEROBIC AND ANAEROBIC Blood Culture adequate volume   Culture   Final    NO GROWTH 3 DAYS Performed at [facility], [address], [place], [place] [address]    Report Status PENDING  Incomplete  Blood Culture (routine x 2)     Status: None (Preliminary result)   Collection Time: 05/24/17  7:40 AM  Result Value Ref Range Status   Specimen Description BLOOD BLOOD RIGHT HAND  Final   Special Requests   Final    BOTTLES DRAWN AEROBIC AND ANAEROBIC Blood Culture adequate volume   Culture   Final    NO GROWTH 3 DAYS Performed at [facility], [address], [place], [place] [address]    Report Status PENDING  Incomplete  Urine culture     Status: Abnormal   Collection Time: 05/24/17 10:32 AM  Result Value Ref Range Status   Specimen Description   Final    URINE, RANDOM Performed at [facility], [address] [place], [place] [address]    Special Requests   Final    NONE Performed at [facility], [address], [place], [place] [address]    Culture MULTIPLE SPECIES PRESENT, SUGGEST RECOLLECTION (A)  Final   Report Status 05/25/2017 FINAL  Final  MRSA PCR Screening     Status: Abnormal   Collection Time: 05/25/17 12:37 PM  Result Value Ref Range Status   MRSA by PCR POSITIVE (A) NEGATIVE Final    Comment:        The GeneXpert MRSA Assay (FDA approved for NASAL specimens only), is one component of a comprehensive MRSA colonization surveillance program. It is not intended to diagnose MRSA infection nor to guide or monitor treatment  for MRSA infections. RESULT CALLED TO, READ BACK BY AND VERIFIED WITH: AMANDA PEREZ AT 1558 ON 05/25/17 BY SNJ Performed at [facility], [address] [place], [place] [address]     RADIOLOGY:  No results found.   Management plans discussed with the patient, family and they are in agreement.  CODE STATUS:     Code Status Orders  (From admission, onward)        Start     Ordered   05/24/17 1312  Do not attempt resuscitation (DNR)  Continuous    Question Answer Comment  In the event of cardiac or respiratory ARREST Do not call a "code blue"   In the event of cardiac or respiratory ARREST Do not perform Intubation, CPR, defibrillation or ACLS   In the event of cardiac or respiratory ARREST Use medication by any route, position, wound care, and other measures to relive pain and suffering. May use oxygen, suction and manual treatment of airway obstruction as needed for comfort.   Comments confirmed with pt.      05/24/17 1311    Code Status History    Date Active Date Inactive Code Status Order ID Comments User Context   07/15/2016 20:20 07/27/2016 18:40 Full Code [id number]  Vianne Bulls, MD ED   04/30/2016 20:16 05/07/2016 18:25 Full Code [id number]  Barton Dubois, MD Inpatient   04/18/2016 10:37 04/19/2016 16:02 Full Code [id number]  Erick Colace, NP Inpatient   01/29/2015 20:59 01/31/2015 17:21 Full Code [id number]  Henreitta Leber, MD Inpatient   12/22/2012 21:21 12/25/2012 17:35 Full Code [id number]  Rise Patience, MD Inpatient  03/27/2011 17:44 03/29/2011 17:01 Full Code [id number]  Rexene Alberts, MD ED   03/09/2011 23:04 03/12/2011 17:41 Full Code [id number]  Ron Parker, RN Inpatient    Advance Directive Documentation     Most Recent Value  Type of Advance Directive  Healthcare Power of Attorney, Living will  Pre-existing out of facility DNR order (yellow form or pink MOST form)  No data  "MOST" Form in Place?  No data      TOTAL TIME TAKING CARE OF  THIS PATIENT: 38 minutes.    Gladstone Lighter M.D on 05/27/2017 at 2:56 PM  Between 7am to 6pm - Pager - [phone]  After 6pm go to [email or web address] - Proofreader  [facility]  Office  [phone]  CC: Primary care physician; Alvester Chou, NP   Note: This dictation was prepared with Dragon dictation along with smaller phrase technology. Any transcriptional errors that result from this process are unintentional.

## 2017-05-27 NOTE — Progress Notes (Signed)
PHARMACIST - PHYSICIAN COMMUNICATION DR:   Tressia Miners CONCERNING: Antibiotic IV to Oral Route Change Policy  RECOMMENDATION: This patient is receiving levofloxacin by the intravenous route.  Based on criteria approved by the Pharmacy and Therapeutics Committee, the antibiotic(s) is/are being converted to the equivalent oral dose form(s).   DESCRIPTION: These criteria include:  Patient being treated for a respiratory tract infection, urinary tract infection, cellulitis or clostridium difficile associated diarrhea if on metronidazole  The patient is not neutropenic and does not exhibit a GI malabsorption state  The patient is eating (either orally or via tube) and/or has been taking other orally administered medications for a least 24 hours  The patient is improving clinically and has a Tmax < 100.5  If you have questions about this conversion, please contact the Pharmacy Department  []   [phone] )  [facility] [x]   [phone] )  [facility] []   [phone] )  [facility] []   [phone] )  [facility] []   [phone] )  [facility]

## 2017-05-28 ENCOUNTER — Telehealth: Payer: Self-pay | Admitting: Oncology

## 2017-05-28 ENCOUNTER — Telehealth: Payer: Self-pay

## 2017-05-28 NOTE — Telephone Encounter (Signed)
This nurse called to check on patient after hospital stay.  She says she is feeling fine right now, no issues.  Patient states that she has her first radiation treatment this Thursday at [facility].  She said they do not know yet how many treatments she will need.  Patient knows to call center with any questions or concerns.

## 2017-05-28 NOTE — Telephone Encounter (Signed)
Scheduled appt per 2/12 sch msg - spoke with patient and confirmed appt.

## 2017-05-29 LAB — CULTURE, BLOOD (ROUTINE X 2)
CULTURE: NO GROWTH
Culture: NO GROWTH
SPECIAL REQUESTS: ADEQUATE
Special Requests: ADEQUATE

## 2017-05-30 ENCOUNTER — Ambulatory Visit
Admission: RE | Admit: 2017-05-30 | Discharge: 2017-05-30 | Disposition: A | Discharge status: Home / Self Care | Payer: Medicare HMO | Source: Ambulatory Visit | Attending: Radiation Oncology | Admitting: Radiation Oncology

## 2017-05-30 DIAGNOSIS — R590 Localized enlarged lymph nodes: Secondary | ICD-10-CM | POA: Diagnosis not present

## 2017-05-30 DIAGNOSIS — J449 Chronic obstructive pulmonary disease, unspecified: Secondary | ICD-10-CM | POA: Diagnosis not present

## 2017-05-30 DIAGNOSIS — Z8701 Personal history of pneumonia (recurrent): Secondary | ICD-10-CM | POA: Diagnosis not present

## 2017-05-30 DIAGNOSIS — D72828 Other elevated white blood cell count: Secondary | ICD-10-CM | POA: Insufficient documentation

## 2017-05-30 DIAGNOSIS — C3490 Malignant neoplasm of unspecified part of unspecified bronchus or lung: Secondary | ICD-10-CM | POA: Diagnosis not present

## 2017-05-30 DIAGNOSIS — I4891 Unspecified atrial fibrillation: Secondary | ICD-10-CM | POA: Insufficient documentation

## 2017-05-30 DIAGNOSIS — Z510 Encounter for antineoplastic radiation therapy: Secondary | ICD-10-CM | POA: Diagnosis not present

## 2017-05-31 ENCOUNTER — Other Ambulatory Visit: Payer: Self-pay | Admitting: *Deleted

## 2017-05-31 DIAGNOSIS — C7931 Secondary malignant neoplasm of brain: Secondary | ICD-10-CM

## 2017-06-03 ENCOUNTER — Telehealth: Payer: Self-pay | Admitting: Medical Oncology

## 2017-06-03 NOTE — Telephone Encounter (Signed)
Returned call -no VM set up.

## 2017-06-04 ENCOUNTER — Inpatient Hospital Stay: Payer: Medicare HMO | Attending: Oncology | Admitting: Oncology

## 2017-06-04 DIAGNOSIS — C3492 Malignant neoplasm of unspecified part of left bronchus or lung: Secondary | ICD-10-CM | POA: Insufficient documentation

## 2017-06-04 DIAGNOSIS — J440 Chronic obstructive pulmonary disease with acute lower respiratory infection: Secondary | ICD-10-CM | POA: Diagnosis not present

## 2017-06-04 DIAGNOSIS — C9110 Chronic lymphocytic leukemia of B-cell type not having achieved remission: Secondary | ICD-10-CM

## 2017-06-04 DIAGNOSIS — B370 Candidal stomatitis: Secondary | ICD-10-CM | POA: Insufficient documentation

## 2017-06-04 DIAGNOSIS — J181 Lobar pneumonia, unspecified organism: Secondary | ICD-10-CM | POA: Insufficient documentation

## 2017-06-04 DIAGNOSIS — C7931 Secondary malignant neoplasm of brain: Secondary | ICD-10-CM

## 2017-06-04 DIAGNOSIS — Z510 Encounter for antineoplastic radiation therapy: Secondary | ICD-10-CM | POA: Diagnosis not present

## 2017-06-04 MED ORDER — CLOTRIMAZOLE 10 MG MT TROC: 10.0000 mg | Freq: Four times a day (QID) | OROMUCOSAL | 0 refills | Status: DC

## 2017-06-04 MED ORDER — FLUCONAZOLE 100 MG PO TABS: 100.0000 mg | ORAL_TABLET | Freq: Every day | ORAL | 0 refills | Status: DC

## 2017-06-04 MED ORDER — HYDROCODONE-ACETAMINOPHEN 5-325 MG PO TABS: 1.0000 | ORAL_TABLET | Freq: Four times a day (QID) | ORAL | 0 refills | Status: DC | PRN

## 2017-06-04 NOTE — Progress Notes (Signed)
Sheri Ayala OFFICE PROGRESS NOTE   Diagnosis: Small cell lung cancer  INTERVAL HISTORY:   Sheri Ayala returns prior to his scheduled visit.  She presented to the [facility] emergency room 05/24/2017 with a right sided headache.  A CT revealed a nonspecific hypodense area in the left cerebellum that appeared new.  She was referred for an MRI of the brain 05/24/2017.  This confirmed an isolated 17 mm left inferior cerebellar metastasis. A chest x-ray revealed a left lower lobe infiltrate consistent with pneumonia.  She was treated with antibiotics and placed on Decadron.  Dr. Donella Stade was consulted and she is scheduled to begin whole brain radiation 06/10/2017.  The headache persists, though it has improved.  She complains of a dry mouth.  She now uses oxygen at home.   Objective:  Vital signs in last 24 hours:  Blood pressure (!) 142/85, pulse 94, temperature 98.3 F (36.8 C), temperature source Oral, resp. rate 19, height 5\' 9"  (1.753 m), weight 128 lb 8 oz (58.3 kg), SpO2 92 %.    HEENT: Extensive thrush over the palate and buccal mucosa Lymphatics: No cervical or supraclavicular nodes Resp: Distant breath sounds, no respiratory distress Cardio: Irregular GI: No hepatosplenomegaly Vascular: No leg edema Neuro: Alert and oriented, finger to nose testing is normal  Portacath/PICC-without erythema  Lab Results:  Lab Results  Component Value Date   WBC 7.8 05/25/2017   HGB 12.2 05/25/2017   HCT 39.0 05/25/2017   MCV 84.2 05/25/2017   PLT 127 (L) 05/25/2017   NEUTROABS 7.8 (H) 05/24/2017    CMP     Component Value Date/Time   NA 138 05/26/2017 0446   NA 140 04/04/2017 1241   K 4.5 05/26/2017 0446   K 4.9 04/04/2017 1241   CL 102 05/26/2017 0446   CO2 28 05/26/2017 0446   CO2 25 04/04/2017 1241   GLUCOSE 123 (H) 05/26/2017 0446   GLUCOSE 112 04/04/2017 1241   BUN 19 05/26/2017 0446   BUN 13.7 04/04/2017 1241   CREATININE 0.67 05/26/2017 0446   CREATININE  0.7 04/04/2017 1241   CALCIUM 9.1 05/26/2017 0446   CALCIUM 9.5 04/04/2017 1241   PROT 6.3 (L) 05/24/2017 0621   PROT 6.7 04/04/2017 1241   ALBUMIN 3.6 05/24/2017 0621   ALBUMIN 4.4 04/04/2017 1241   AST 19 05/24/2017 0621   AST 22 04/04/2017 1241   ALT 9 (L) 05/24/2017 0621   ALT 20 04/04/2017 1241   ALKPHOS 91 05/24/2017 0621   ALKPHOS 122 04/04/2017 1241   BILITOT 0.4 05/24/2017 0621   BILITOT 0.91 04/04/2017 1241   GFRNONAA >60 05/26/2017 0446   GFRAA >60 05/26/2017 0446    No results found for: CEA1  Lab Results  Component Value Date   INR 1.15 05/24/2017    Imaging:  As per HPI, 05/24/2017 CT and MRI images reviewed Medications: I have reviewed the patient's current medications.   Assessment/Plan: 1. 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 COPD 3. 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  No definite new or progressive metastatic disease in the chest.  Tiny left lower lobe pulmonary nodules stable.  Solitary new 6 mm subpleural nodular opacity medial basilar  right lower lobe favored to be minimal focal scarring or atelectasis.  Continued reduction of mild mediastinal adenopathy.  Patchy sclerotic lesions throughout the sternum and thoracic spine, decreased in sclerosis, not appreciably changed in size or number. 4. Atrial fibrillation. Cardizem discontinued due to a skin rash. Follow-up with cardiology. 5. Leukocytosis secondary to CLL and steroids 6. Rash 07/25/2016-appearedto have a drug rash, could be related to antibiotics given earlier duringhospital admission, or contrast for the brain MRI; recurrent rash 08/03/2016--cardizem discontinued. Rash resolved 08/15/2016. 7. Oral candidiasis-Diflucan added 07/25/2016, recurrent oral candidiasis 06/04/2017-Diflucan/Mycelex troches 8.  Brain CT 05/02/2017-no metastatic disease or acute intracranial abnormality.  [facility]  Brain CT 05/24/2017 with a nonspecific hypodense area in the left cerebellum  Brain MRI 05/24/2017 confirmed an isolated left cerebellar metastasis  9.  Left lower lobe pneumonia 05/24/2017    Disposition: Sheri Ayala has extensive stage small cell lung cancer.  She has been diagnosed with a left cerebellar metastasis.  Her symptoms improved with Decadron.  She will begin whole brain radiation under the direction of Dr. Donella Stade.  Decadron can be tapered per Dr. Donella Stade.  There is no clinical evidence of systemic disease progression.  She has early stage CLL and remains asymptomatic.  Sheri Ayala will complete a course of Diflucan and begin Mycelex troches for the oral candidiasis.  She will return for an office visit at the completion of radiation.  25 minutes were spent with the patient today.  The majority of the time was used for counseling and coordination of care.  Betsy Coder, MD  06/04/2017  11:25 AM

## 2017-06-05 ENCOUNTER — Telehealth: Payer: Self-pay | Admitting: Oncology

## 2017-06-05 NOTE — Telephone Encounter (Signed)
Scheduled apt per 2/19 los - Sent reminder letter in the mail - lab and f/u 3/14

## 2017-06-06 ENCOUNTER — Ambulatory Visit
Admission: RE | Admit: 2017-06-06 | Discharge: 2017-06-06 | Disposition: A | Discharge status: Home / Self Care | Payer: Medicare HMO | Source: Ambulatory Visit | Attending: Radiation Oncology | Admitting: Radiation Oncology

## 2017-06-06 DIAGNOSIS — Z510 Encounter for antineoplastic radiation therapy: Secondary | ICD-10-CM | POA: Diagnosis not present

## 2017-06-10 ENCOUNTER — Ambulatory Visit: Payer: Medicare HMO

## 2017-06-10 ENCOUNTER — Emergency Department: Payer: Medicare HMO

## 2017-06-10 ENCOUNTER — Inpatient Hospital Stay
Admission: EM | Admit: 2017-06-10 | Discharge: 2017-06-14 | DRG: 208 | Disposition: A | Discharge status: Hospice | Payer: Medicare HMO | Attending: Internal Medicine | Admitting: Internal Medicine

## 2017-06-10 ENCOUNTER — Encounter: Payer: Self-pay | Admitting: Emergency Medicine

## 2017-06-10 ENCOUNTER — Other Ambulatory Visit: Payer: Self-pay

## 2017-06-10 DIAGNOSIS — Z9221 Personal history of antineoplastic chemotherapy: Secondary | ICD-10-CM | POA: Diagnosis not present

## 2017-06-10 DIAGNOSIS — Z66 Do not resuscitate: Secondary | ICD-10-CM | POA: Diagnosis not present

## 2017-06-10 DIAGNOSIS — T380X5A Adverse effect of glucocorticoids and synthetic analogues, initial encounter: Secondary | ICD-10-CM | POA: Diagnosis present

## 2017-06-10 DIAGNOSIS — Z9981 Dependence on supplemental oxygen: Secondary | ICD-10-CM

## 2017-06-10 DIAGNOSIS — J9621 Acute and chronic respiratory failure with hypoxia: Secondary | ICD-10-CM | POA: Diagnosis not present

## 2017-06-10 DIAGNOSIS — I671 Cerebral aneurysm, nonruptured: Secondary | ICD-10-CM | POA: Diagnosis present

## 2017-06-10 DIAGNOSIS — Z7951 Long term (current) use of inhaled steroids: Secondary | ICD-10-CM

## 2017-06-10 DIAGNOSIS — C3492 Malignant neoplasm of unspecified part of left bronchus or lung: Principal | ICD-10-CM | POA: Diagnosis present

## 2017-06-10 DIAGNOSIS — W19XXXA Unspecified fall, initial encounter: Secondary | ICD-10-CM

## 2017-06-10 DIAGNOSIS — I480 Paroxysmal atrial fibrillation: Secondary | ICD-10-CM | POA: Diagnosis present

## 2017-06-10 DIAGNOSIS — Z91018 Allergy to other foods: Secondary | ICD-10-CM

## 2017-06-10 DIAGNOSIS — C7931 Secondary malignant neoplasm of brain: Secondary | ICD-10-CM | POA: Diagnosis not present

## 2017-06-10 DIAGNOSIS — F17290 Nicotine dependence, other tobacco product, uncomplicated: Secondary | ICD-10-CM | POA: Diagnosis present

## 2017-06-10 DIAGNOSIS — Y92009 Unspecified place in unspecified non-institutional (private) residence as the place of occurrence of the external cause: Secondary | ICD-10-CM

## 2017-06-10 DIAGNOSIS — Z978 Presence of other specified devices: Secondary | ICD-10-CM | POA: Diagnosis not present

## 2017-06-10 DIAGNOSIS — E872 Acidosis: Secondary | ICD-10-CM | POA: Diagnosis present

## 2017-06-10 DIAGNOSIS — D696 Thrombocytopenia, unspecified: Secondary | ICD-10-CM | POA: Diagnosis present

## 2017-06-10 DIAGNOSIS — J441 Chronic obstructive pulmonary disease with (acute) exacerbation: Secondary | ICD-10-CM

## 2017-06-10 DIAGNOSIS — W010XXA Fall on same level from slipping, tripping and stumbling without subsequent striking against object, initial encounter: Secondary | ICD-10-CM | POA: Diagnosis present

## 2017-06-10 DIAGNOSIS — Y9301 Activity, walking, marching and hiking: Secondary | ICD-10-CM | POA: Diagnosis present

## 2017-06-10 DIAGNOSIS — Z8249 Family history of ischemic heart disease and other diseases of the circulatory system: Secondary | ICD-10-CM | POA: Diagnosis not present

## 2017-06-10 DIAGNOSIS — R739 Hyperglycemia, unspecified: Secondary | ICD-10-CM | POA: Diagnosis present

## 2017-06-10 DIAGNOSIS — Z923 Personal history of irradiation: Secondary | ICD-10-CM | POA: Diagnosis not present

## 2017-06-10 DIAGNOSIS — F329 Major depressive disorder, single episode, unspecified: Secondary | ICD-10-CM | POA: Diagnosis present

## 2017-06-10 DIAGNOSIS — Z4659 Encounter for fitting and adjustment of other gastrointestinal appliance and device: Secondary | ICD-10-CM | POA: Diagnosis not present

## 2017-06-10 DIAGNOSIS — C9110 Chronic lymphocytic leukemia of B-cell type not having achieved remission: Secondary | ICD-10-CM | POA: Diagnosis not present

## 2017-06-10 DIAGNOSIS — Z8673 Personal history of transient ischemic attack (TIA), and cerebral infarction without residual deficits: Secondary | ICD-10-CM | POA: Diagnosis not present

## 2017-06-10 DIAGNOSIS — Z79899 Other long term (current) drug therapy: Secondary | ICD-10-CM

## 2017-06-10 DIAGNOSIS — Z888 Allergy status to other drugs, medicaments and biological substances status: Secondary | ICD-10-CM

## 2017-06-10 DIAGNOSIS — Z7189 Other specified counseling: Secondary | ICD-10-CM | POA: Diagnosis not present

## 2017-06-10 DIAGNOSIS — Y95 Nosocomial condition: Secondary | ICD-10-CM | POA: Diagnosis present

## 2017-06-10 DIAGNOSIS — J189 Pneumonia, unspecified organism: Secondary | ICD-10-CM | POA: Diagnosis present

## 2017-06-10 DIAGNOSIS — J910 Malignant pleural effusion: Secondary | ICD-10-CM | POA: Diagnosis present

## 2017-06-10 DIAGNOSIS — J9600 Acute respiratory failure, unspecified whether with hypoxia or hypercapnia: Secondary | ICD-10-CM | POA: Diagnosis not present

## 2017-06-10 DIAGNOSIS — Z880 Allergy status to penicillin: Secondary | ICD-10-CM

## 2017-06-10 DIAGNOSIS — I5032 Chronic diastolic (congestive) heart failure: Secondary | ICD-10-CM | POA: Diagnosis not present

## 2017-06-10 DIAGNOSIS — J439 Emphysema, unspecified: Secondary | ICD-10-CM | POA: Diagnosis present

## 2017-06-10 DIAGNOSIS — A419 Sepsis, unspecified organism: Secondary | ICD-10-CM

## 2017-06-10 DIAGNOSIS — I4891 Unspecified atrial fibrillation: Secondary | ICD-10-CM

## 2017-06-10 DIAGNOSIS — Z90710 Acquired absence of both cervix and uterus: Secondary | ICD-10-CM

## 2017-06-10 DIAGNOSIS — J869 Pyothorax without fistula: Secondary | ICD-10-CM | POA: Diagnosis present

## 2017-06-10 DIAGNOSIS — J90 Pleural effusion, not elsewhere classified: Secondary | ICD-10-CM

## 2017-06-10 DIAGNOSIS — C3490 Malignant neoplasm of unspecified part of unspecified bronchus or lung: Secondary | ICD-10-CM | POA: Diagnosis not present

## 2017-06-10 DIAGNOSIS — J9620 Acute and chronic respiratory failure, unspecified whether with hypoxia or hypercapnia: Secondary | ICD-10-CM | POA: Diagnosis present

## 2017-06-10 DIAGNOSIS — J9811 Atelectasis: Secondary | ICD-10-CM | POA: Diagnosis present

## 2017-06-10 DIAGNOSIS — T85598A Other mechanical complication of other gastrointestinal prosthetic devices, implants and grafts, initial encounter: Secondary | ICD-10-CM

## 2017-06-10 DIAGNOSIS — E875 Hyperkalemia: Secondary | ICD-10-CM | POA: Diagnosis present

## 2017-06-10 DIAGNOSIS — I081 Rheumatic disorders of both mitral and tricuspid valves: Secondary | ICD-10-CM | POA: Diagnosis present

## 2017-06-10 DIAGNOSIS — J9690 Respiratory failure, unspecified, unspecified whether with hypoxia or hypercapnia: Secondary | ICD-10-CM

## 2017-06-10 DIAGNOSIS — Z79891 Long term (current) use of opiate analgesic: Secondary | ICD-10-CM

## 2017-06-10 DIAGNOSIS — Z515 Encounter for palliative care: Secondary | ICD-10-CM | POA: Diagnosis not present

## 2017-06-10 DIAGNOSIS — F419 Anxiety disorder, unspecified: Secondary | ICD-10-CM | POA: Diagnosis present

## 2017-06-10 HISTORY — DX: Malignant neoplasm of unspecified part of unspecified bronchus or lung: C34.90

## 2017-06-10 HISTORY — DX: Secondary malignant neoplasm of brain: C79.31

## 2017-06-10 LAB — COMPREHENSIVE METABOLIC PANEL
ALK PHOS: 85 U/L (ref 38–126)
ALT: 84 U/L — ABNORMAL HIGH (ref 14–54)
ANION GAP: 10 (ref 5–15)
AST: 48 U/L — ABNORMAL HIGH (ref 15–41)
Albumin: 3.6 g/dL (ref 3.5–5.0)
BUN: 53 mg/dL — ABNORMAL HIGH (ref 6–20)
CALCIUM: 8.8 mg/dL — AB (ref 8.9–10.3)
CO2: 33 mmol/L — ABNORMAL HIGH (ref 22–32)
Chloride: 89 mmol/L — ABNORMAL LOW (ref 101–111)
Creatinine, Ser: 0.78 mg/dL (ref 0.44–1.00)
GFR calc non Af Amer: >60 mL/min (ref >60)
GLUCOSE: 172 mg/dL — AB (ref 65–99)
POTASSIUM: 5.7 mmol/L — AB (ref 3.5–5.1)
Sodium: 132 mmol/L — ABNORMAL LOW (ref 135–145)
Total Bilirubin: 0.8 mg/dL (ref 0.3–1.2)
Total Protein: 6.4 g/dL — ABNORMAL LOW (ref 6.5–8.1)

## 2017-06-10 LAB — CBC WITH DIFFERENTIAL/PLATELET
Basophils Absolute: 0 10*3/uL (ref 0–0.1)
Basophils Relative: 0 %
EOS ABS: 0 10*3/uL (ref 0–0.7)
Eosinophils Relative: 0 %
HEMATOCRIT: 44.7 % (ref 35.0–47.0)
HEMOGLOBIN: 13.4 g/dL (ref 12.0–16.0)
LYMPHS PCT: 66 %
Lymphs Abs: 46.4 10*3/uL — ABNORMAL HIGH (ref 1.0–3.6)
MCH: 26.1 pg (ref 26.0–34.0)
MCHC: 30.1 g/dL — ABNORMAL LOW (ref 32.0–36.0)
MCV: 86.6 fL (ref 80.0–100.0)
MONOS PCT: 2 %
Monocytes Absolute: 1.4 10*3/uL — ABNORMAL HIGH (ref 0.2–0.9)
NEUTROS ABS: 22.5 10*3/uL — AB (ref 1.4–6.5)
NEUTROS PCT: 32 %
Platelets: 222 10*3/uL (ref 150–440)
RBC: 5.16 MIL/uL (ref 3.80–5.20)
RDW: 19.3 % — ABNORMAL HIGH (ref 11.5–14.5)
WBC: 70.3 10*3/uL (ref 3.6–11.0)

## 2017-06-10 LAB — TROPONIN I

## 2017-06-10 LAB — GLUCOSE, CAPILLARY
Glucose-Capillary: 230 mg/dL — ABNORMAL HIGH (ref 65–99)
Glucose-Capillary: 243 mg/dL — ABNORMAL HIGH (ref 65–99)

## 2017-06-10 LAB — LACTIC ACID, PLASMA
LACTIC ACID, VENOUS: 2.5 mmol/L — AB (ref 0.5–1.9)
LACTIC ACID, VENOUS: 2.1 mmol/L — AB (ref 0.5–1.9)
LACTIC ACID, VENOUS: 2.5 mmol/L — AB (ref 0.5–1.9)

## 2017-06-10 LAB — INFLUENZA PANEL BY PCR (TYPE A & B)
INFLAPCR: NEGATIVE
INFLBPCR: NEGATIVE

## 2017-06-10 LAB — TSH: TSH: 0.458 u[IU]/mL (ref 0.350–4.500)

## 2017-06-10 LAB — PROCALCITONIN

## 2017-06-10 LAB — MRSA PCR SCREENING: MRSA by PCR: NEGATIVE

## 2017-06-10 LAB — MAGNESIUM: MAGNESIUM: 2.2 mg/dL (ref 1.7–2.4)

## 2017-06-10 MED ORDER — DEXAMETHASONE 4 MG PO TABS
4.0000 mg | ORAL_TABLET | Freq: Two times a day (BID) | ORAL | Status: DC
  Administered 2017-06-10: 4 mg via ORAL
  Filled 2017-06-10: qty 1

## 2017-06-10 MED ORDER — SODIUM POLYSTYRENE SULFONATE 15 GM/60ML PO SUSP
15.0000 g | Freq: Once | ORAL | Status: AC
  Administered 2017-06-10: 15 g via ORAL
  Filled 2017-06-10: qty 60

## 2017-06-10 MED ORDER — AMIODARONE LOAD VIA INFUSION
150.0000 mg | Freq: Once | INTRAVENOUS | Status: AC
  Administered 2017-06-10: 150 mg via INTRAVENOUS
  Filled 2017-06-10 (×3): qty 83.34

## 2017-06-10 MED ORDER — METOPROLOL SUCCINATE ER 50 MG PO TB24
50.0000 mg | ORAL_TABLET | Freq: Two times a day (BID) | ORAL | Status: DC
  Administered 2017-06-10: 50 mg via ORAL
  Filled 2017-06-10: qty 1

## 2017-06-10 MED ORDER — SODIUM CHLORIDE 0.9 % IV BOLUS (SEPSIS)
1000.0000 mL | Freq: Once | INTRAVENOUS | Status: AC
  Administered 2017-06-10: 1000 mL via INTRAVENOUS

## 2017-06-10 MED ORDER — AMIODARONE HCL IN DEXTROSE 360-4.14 MG/200ML-% IV SOLN
60.0000 mg/h | INTRAVENOUS | Status: DC
  Filled 2017-06-10: qty 200

## 2017-06-10 MED ORDER — DOCUSATE SODIUM 100 MG PO CAPS: 100.0000 mg | ORAL_CAPSULE | Freq: Two times a day (BID) | ORAL | Status: DC | PRN

## 2017-06-10 MED ORDER — IPRATROPIUM-ALBUTEROL 0.5-2.5 (3) MG/3ML IN SOLN
3.0000 mL | Freq: Once | RESPIRATORY_TRACT | Status: AC
  Administered 2017-06-10: 3 mL via RESPIRATORY_TRACT
  Filled 2017-06-10: qty 9

## 2017-06-10 MED ORDER — ALBUTEROL SULFATE (2.5 MG/3ML) 0.083% IN NEBU: 2.5000 mg | INHALATION_SOLUTION | RESPIRATORY_TRACT | Status: DC | PRN

## 2017-06-10 MED ORDER — METOPROLOL TARTRATE 5 MG/5ML IV SOLN: 5.0000 mg | Freq: Once | INTRAVENOUS | Status: DC

## 2017-06-10 MED ORDER — VANCOMYCIN HCL IN DEXTROSE 750-5 MG/150ML-% IV SOLN
750.0000 mg | INTRAVENOUS | Status: DC
  Administered 2017-06-11: 750 mg via INTRAVENOUS
  Filled 2017-06-10 (×2): qty 150

## 2017-06-10 MED ORDER — AMIODARONE HCL IN DEXTROSE 360-4.14 MG/200ML-% IV SOLN: 30.0000 mg/h | INTRAVENOUS | Status: DC

## 2017-06-10 MED ORDER — METHYLPREDNISOLONE SODIUM SUCC 125 MG IJ SOLR
125.0000 mg | Freq: Once | INTRAMUSCULAR | Status: AC
  Administered 2017-06-10: 125 mg via INTRAVENOUS
  Filled 2017-06-10: qty 2

## 2017-06-10 MED ORDER — METOPROLOL TARTRATE 5 MG/5ML IV SOLN
5.0000 mg | Freq: Once | INTRAVENOUS | Status: AC
  Administered 2017-06-10: 5 mg via INTRAVENOUS
  Filled 2017-06-10: qty 5

## 2017-06-10 MED ORDER — IPRATROPIUM-ALBUTEROL 0.5-2.5 (3) MG/3ML IN SOLN
3.0000 mL | RESPIRATORY_TRACT | Status: DC
  Administered 2017-06-10 (×2): 3 mL via RESPIRATORY_TRACT
  Filled 2017-06-10 (×3): qty 3

## 2017-06-10 MED ORDER — SODIUM CHLORIDE 0.9 % IV SOLN
INTRAVENOUS | Status: DC
Start: 2017-06-10 — End: 2017-06-11
  Administered 2017-06-10: 17:00:00 via INTRAVENOUS

## 2017-06-10 MED ORDER — ALPRAZOLAM 0.5 MG PO TABS
0.5000 mg | ORAL_TABLET | Freq: Two times a day (BID) | ORAL | Status: DC | PRN
Start: 2017-06-10 — End: 2017-06-11

## 2017-06-10 MED ORDER — HEPARIN SODIUM (PORCINE) 5000 UNIT/ML IJ SOLN
5000.0000 [IU] | Freq: Three times a day (TID) | INTRAMUSCULAR | Status: DC
  Administered 2017-06-10 – 2017-06-14 (×7): 5000 [IU] via SUBCUTANEOUS
  Filled 2017-06-10 (×7): qty 1

## 2017-06-10 MED ORDER — IPRATROPIUM-ALBUTEROL 0.5-2.5 (3) MG/3ML IN SOLN
3.0000 mL | Freq: Once | RESPIRATORY_TRACT | Status: AC
  Administered 2017-06-10: 3 mL via RESPIRATORY_TRACT

## 2017-06-10 MED ORDER — SODIUM CHLORIDE 0.9 % IV SOLN
2.0000 g | Freq: Two times a day (BID) | INTRAVENOUS | Status: DC
  Administered 2017-06-10 – 2017-06-13 (×7): 2 g via INTRAVENOUS
  Filled 2017-06-10 (×9): qty 2

## 2017-06-10 MED ORDER — SODIUM BICARBONATE 8.4 % IV SOLN
50.0000 meq | Freq: Once | INTRAVENOUS | Status: AC
  Administered 2017-06-10: 50 meq via INTRAVENOUS
  Filled 2017-06-10: qty 50

## 2017-06-10 MED ORDER — TEMAZEPAM 15 MG PO CAPS
30.0000 mg | ORAL_CAPSULE | Freq: Every day | ORAL | Status: DC
  Administered 2017-06-10: 30 mg via ORAL
  Filled 2017-06-10: qty 2

## 2017-06-10 MED ORDER — PANTOPRAZOLE SODIUM 40 MG PO TBEC
40.0000 mg | DELAYED_RELEASE_TABLET | Freq: Every day | ORAL | Status: DC
  Administered 2017-06-10: 40 mg via ORAL
  Filled 2017-06-10: qty 1

## 2017-06-10 MED ORDER — HYDROCODONE-ACETAMINOPHEN 5-325 MG PO TABS: 1.0000 | ORAL_TABLET | Freq: Four times a day (QID) | ORAL | Status: DC | PRN

## 2017-06-10 MED ORDER — IOPAMIDOL (ISOVUE-300) INJECTION 61%
75.0000 mL | Freq: Once | INTRAVENOUS | Status: AC | PRN
  Administered 2017-06-10: 75 mL via INTRAVENOUS

## 2017-06-10 MED ORDER — ENSURE ENLIVE PO LIQD
237.0000 mL | Freq: Two times a day (BID) | ORAL | Status: DC
  Administered 2017-06-10: 237 mL via ORAL

## 2017-06-10 MED ORDER — INSULIN ASPART 100 UNIT/ML ~~LOC~~ SOLN
10.0000 [IU] | Freq: Once | SUBCUTANEOUS | Status: AC
  Administered 2017-06-10: 10 [IU] via INTRAVENOUS
  Filled 2017-06-10: qty 1

## 2017-06-10 MED ORDER — VANCOMYCIN HCL IN DEXTROSE 1-5 GM/200ML-% IV SOLN
1000.0000 mg | Freq: Once | INTRAVENOUS | Status: AC
  Administered 2017-06-10: 1000 mg via INTRAVENOUS
  Filled 2017-06-10: qty 200

## 2017-06-10 MED ORDER — DEXTROSE 50 % IV SOLN
1.0000 | Freq: Once | INTRAVENOUS | Status: AC
  Administered 2017-06-10: 50 mL via INTRAVENOUS
  Filled 2017-06-10: qty 50

## 2017-06-10 MED ORDER — TOPIRAMATE 25 MG PO TABS
50.0000 mg | ORAL_TABLET | Freq: Every day | ORAL | Status: DC
  Administered 2017-06-10: 50 mg via ORAL
  Filled 2017-06-10 (×2): qty 2

## 2017-06-10 MED ORDER — MAGNESIUM SULFATE 2 GM/50ML IV SOLN
2.0000 g | Freq: Once | INTRAVENOUS | Status: AC
  Administered 2017-06-10: 2 g via INTRAVENOUS
  Filled 2017-06-10: qty 50

## 2017-06-10 MED ORDER — SERTRALINE HCL 50 MG PO TABS
25.0000 mg | ORAL_TABLET | Freq: Every day | ORAL | Status: DC
  Administered 2017-06-10: 25 mg via ORAL
  Filled 2017-06-10: qty 1

## 2017-06-10 MED ORDER — SUMATRIPTAN SUCCINATE 50 MG PO TABS
50.0000 mg | ORAL_TABLET | ORAL | Status: DC | PRN
  Filled 2017-06-10: qty 1

## 2017-06-10 MED ORDER — METOPROLOL SUCCINATE ER 50 MG PO TB24
50.0000 mg | ORAL_TABLET | Freq: Every day | ORAL | Status: DC
  Administered 2017-06-10: 50 mg via ORAL
  Filled 2017-06-10: qty 1

## 2017-06-10 MED ORDER — SODIUM CHLORIDE 0.9 % IV SOLN
1.0000 g | Freq: Once | INTRAVENOUS | Status: AC
  Administered 2017-06-10: 1 g via INTRAVENOUS
  Filled 2017-06-10: qty 1

## 2017-06-10 MED ORDER — ALBUTEROL SULFATE HFA 108 (90 BASE) MCG/ACT IN AERS: 2.0000 | INHALATION_SPRAY | RESPIRATORY_TRACT | Status: DC | PRN

## 2017-06-10 NOTE — Progress Notes (Signed)
Both phone and text paged Dr. Anselm Jungling regarding patient's positive Chest CT result. Awaiting response / new order(s). Will continue to monitor. Wenda Low [facility]

## 2017-06-10 NOTE — Progress Notes (Signed)
Pt HR in 150's- 160's, unable to administer Duoneb. RN aware.

## 2017-06-10 NOTE — Progress Notes (Signed)
Amio bolus started at 2321. Patient noted to have agonal breathing upon assessment and rapid response was called. Patient intubated and transferred to ICU 11. AC attempted 3 times to notify family of event, but no answer. Amio gtt on hold due to patient's BP becoming soft post-bolus. Report given to Pamala Hurry RN at bedside and all questions answered. Nursing staff will continue to monitor for any changes in patient status. Earleen Reaper, RN

## 2017-06-10 NOTE — Progress Notes (Signed)
CODE SEPSIS - PHARMACY COMMUNICATION  **Broad Spectrum Antibiotics should be administered within 1 hour of Sepsis diagnosis**  Time Code Sepsis Called/Page Received: 1308  Antibiotics Ordered: cefepime and vancomycin  Time of 1st antibiotic administration: 1406  Additional action taken by pharmacy: N/A  If necessary, Name of Provider/Nurse Contacted: N/A    Eino Whitner L ,PharmD Clinical Pharmacist  06/10/2017  1:24 PM

## 2017-06-10 NOTE — Consult Note (Addendum)
PULMONARY / CRITICAL CARE MEDICINE   Name: Sheri Ayala MRN: [id number] DOB: 10/28/1950    ADMISSION DATE:  06/10/2017 CONSULTATION DATE: 06/10/2017  REFERRING MD: Dr. 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  She presented to [facility] ER via EMS on 02/25 with c/o dizziness resulting in lost of balance and a fall at home.  Upon EMS arrival her O2 sats on RA were in the 80's with diffuse wheezing, she does wear 2L O2 at baseline.  She also endorsed low grade temps 100.2 F, N/V/D, and shortness of breath.  In the ER she was found to be in atrial fibrillation with rvr hr 120-130's and bp stable. Therefore, she received iv metoprolol and 1L NS bolus with improvement of hr 110's.  CXR revealed new complete opacification of the left hemithorax, presumably due to large pleural effusion possible empyema.  She was recently discharged from [facility] on 05/27/17 following treatment of pneumonia.  She has a hx of CLL (with watchful waiting) and small cell lung cancer (treatment with chemotherapy last chemo 11/21/2017).  During recent hospitalization she was diagnosed with CNS metastasis, she has not started chemotherapy or radiation. Due to current symptoms on 02/25 she was subsequently admitted to the telemetry unit by hospitalist team for further workup and treatment Oncology consulted.  On 06/10/17 CT Chest revealed extensive mediastinal, supraclavicular, and upper abdominal adenopathy consistent with recurrent small cell lung cancer, extensive tumor throughout the left pleural space with a loculated large left pleural effusion with  complete collapse of the left lung, and left pleural paraspinal tumor.  Oncology recommended IR consult for palliative thoracentesis with placement of pigtail catheter given likelihood of fast re-accumulation of pleural effusion.  On 06/11/17 she developed recurrence of atrial fibrillation with rvr, therefore amiodarone bolus and gtt ordered.  During administration of amiodarone bolus pt developed agonal respiration requiring mechanical intubation.  She was subsequently transferred to ICU and PCCM consulted for vent management.    PAST MEDICAL HISTORY :  She  has a past medical history of Allergy, Aneurysm of middle cerebral artery (05/24/2017), Anxiety, Asthma, Atrial fibrillation ([place]) (04/2016), Brain metastases ([place]) (2019), Bronchospasm, Cerebrovascular accident (CVA) ([place]) (08/28/2015), Chronic lymphocytic leukemia ([place]) (2004), COPD (chronic obstructive pulmonary disease) ([place]), Depression, Emphysema of lung ([place]), Frontal sinusitis (November 2012), H/O viral encephalitis, Headache(784.0), Heart murmur, Mitral prolapse (11/24/2015), Mitral regurgitation (11/24/2015), Olecranon bursitis of left elbow (November 2012.), Pleurisy, Pneumonia, Rhinitis, Small cell lung cancer ([place]), Thrombocytopenia ([place]), Tobacco abuse, and Tricuspid valve regurgitation (11/24/2015).  PAST SURGICAL HISTORY: She  has a past surgical history that includes Abdominal hysterectomy; Colonoscopy; Upper gastrointestinal endoscopy; Video bronchoscopy (Bilateral, 04/18/2016); and Video bronchoscopy with endobronchial ultrasound (N/A, 07/23/2016).  Allergies  Allergen Reactions  . Eliquis [Apixaban] Rash    Hives, and difficulty swallowing  . Cardizem Cd [Diltiazem Hcl Er Beads] Rash  . Citrus Other (See Comments)    Burning in stomach   . Penicillins Hives, Nausea And Vomiting, Swelling and Other (See Comments)    Patient reports tolerating ampicillin.  Has patient had a PCN reaction causing immediate rash,  facial/tongue/throat swelling, SOB or lightheadedness with hypotension: Yes Has patient had a PCN reaction  causing severe rash involving mucus membranes or skin necrosis: unknown Has patient had a PCN reaction that required hospitalization No Has patient had a PCN reaction occurring within the last 10 years: No If all of the above answers are "NO", then may proceed with Cephalosporin use.     No current facility-administered medications on file prior to encounter.    Current Outpatient Medications on File Prior to Encounter  Medication Sig  . albuterol (PROVENTIL) (2.5 MG/3ML) 0.083% nebulizer solution Take 3 mLs (2.5 mg total) by nebulization every 4 (four) hours as needed for wheezing or shortness of breath.  . ALPRAZolam (XANAX) 0.5 MG tablet Take 1 tablet (0.5 mg total) by mouth 2 (two) times daily as needed for anxiety or sleep.  . budesonide-formoterol (SYMBICORT) 160-4.5 MCG/ACT inhaler Take 2 puffs first thing in am and then another 2 puffs about 12 hours later.  . clotrimazole (MYCELEX) 10 MG troche Take 1 tablet (10 mg total) by mouth 4 (four) times daily. Take while on decadron.  Marland Kitchen dexamethasone (DECADRON) 4 MG tablet Take 1 tablet (4 mg total) by mouth every 12 (twelve) hours.  . fluconazole (DIFLUCAN) 100 MG tablet Take 1 tablet (100 mg total) by mouth daily. Take for 4 days.  Marland Kitchen HYDROcodone-acetaminophen (NORCO) 5-325 MG tablet Take 1 tablet by mouth every 6 (six) hours as needed for severe pain.  . OXYGEN Inhale 2 L into the lungs as needed (for shortness of breath).   . pantoprazole (PROTONIX) 40 MG tablet Take 40 mg daily by mouth.  . rizatriptan (MAXALT) 10 MG tablet Take 10 mg by mouth as needed.   . sertraline (ZOLOFT) 25 MG tablet Take 25 mg by mouth at bedtime.   . temazepam (RESTORIL) 30 MG capsule Take 1 capsule (30 mg total) by mouth at bedtime.  . topiramate (TOPAMAX) 50 MG tablet Take 1 tablet by mouth daily.  . VENTOLIN HFA 108 (90 Base) MCG/ACT inhaler Inhale 2  puffs into the lungs every 4 (four) hours as needed for wheezing or shortness of breath.   . feeding supplement, ENSURE ENLIVE, (ENSURE ENLIVE) LIQD Take 237 mLs by mouth 2 (two) times daily between meals.  . polyethylene glycol (MIRALAX / GLYCOLAX) packet Take 17 g by mouth 2 (two) times daily. (Patient not taking: Reported on 04/04/2017)  . prochlorperazine (COMPAZINE) 10 MG tablet Take 0.5 tablets (5 mg total) by mouth every 6 (six) hours as needed for nausea or vomiting. (Patient not taking: Reported on 04/20/2017)    FAMILY HISTORY:  Her indicated that her mother is deceased. She indicated that her father is deceased. She indicated that all of her three sisters are alive. She indicated that her brother is deceased. She indicated that her daughter is alive. She indicated that her son is alive. She indicated that the status of her neg hx is unknown.   SOCIAL HISTORY: She  reports that she has quit smoking. She smoked 0.00 packs per day for 45.00 years. she has never used smokeless tobacco. She reports that she does not drink alcohol or use drugs.  REVIEW OF SYSTEMS:   Unable to assess pt intubated   SUBJECTIVE:  Unable to assess pt intubated   VITAL SIGNS: BP (!) 157/91 (BP Location: Right Arm)   Pulse (!) 138   Temp 97.8 F (36.6 C)   Resp 17   Ht 5' 9.5" (1.765 m)   Wt 54.4 kg (120 lb)   SpO2 90%   BMI 17.47 kg/m   HEMODYNAMICS:  VENTILATOR SETTINGS:    INTAKE / OUTPUT: I/O last 3 completed shifts: In: 1350 [IV Piggyback:1350] Out: -   PHYSICAL EXAMINATION: General: acutely ill frail appearing female mechanically intubated  Neuro: sedated, not following commands, PERRL HEENT: supple, mild JVD  Cardiovascular: irregular irregular, no R/G Lungs: diffuse wheezes throughout, even, non labored  Abdomen: +BS x4, soft, non distended  Musculoskeletal: no edema  Skin: intact no rashes or lesions, cyanotic   LABS:  BMET Recent Labs  Lab 06/10/17 1214  NA 132*  K  5.7*  CL 89*  CO2 33*  BUN 53*  CREATININE 0.78  GLUCOSE 172*    Electrolytes Recent Labs  Lab 06/10/17 1214  CALCIUM 8.8*  MG 2.2    CBC Recent Labs  Lab 06/10/17 1214  WBC 70.3*  HGB 13.4  HCT 44.7  PLT 222    Coag's No results for input(s): APTT, INR in the last 168 hours.  Sepsis Markers Recent Labs  Lab 06/10/17 1214 06/10/17 1404 06/10/17 1649 06/10/17 1700  LATICACIDVEN 2.5* 2.1*  --  2.5*  PROCALCITON  --   --  <0.10  --     ABG No results for input(s): PHART, PCO2ART, PO2ART in the last 168 hours.  Liver Enzymes Recent Labs  Lab 06/10/17 1214  AST 48*  ALT 84*  ALKPHOS 85  BILITOT 0.8  ALBUMIN 3.6    Cardiac Enzymes Recent Labs  Lab 06/10/17 1445 06/10/17 1903  TROPONINI <0.03 <0.03    Glucose Recent Labs  Lab 06/10/17 2328  GLUCAP 243*    Imaging Dg Chest 2 View  Result Date: 06/10/2017 CLINICAL DATA:  Shortness of breath. History of COPD and lung cancer. History of brain cancer scheduled to start chemotherapy today. EXAM: CHEST  2 VIEW COMPARISON:  Chest x-ray dated 05/24/2017. FINDINGS: Complete opacification of the left chest, presumably large pleural effusion. Associated slight rightward shift of the cardiomediastinal structures. Right lung is clear. No acute or suspicious osseous finding. IMPRESSION: New complete opacification of the left hemithorax, presumably due to large pleural effusion. Electronically Signed   By: Franki Cabot M.D.   On: 06/10/2017 13:36   Ct Chest W Contrast  Result Date: 06/10/2017 CLINICAL DATA:  Metastatic brain lesion. Recent pneumonia. History of COPD and small cell lung cancer. Progressive weakness, cough and shortness of breath. EXAM: CT CHEST WITH CONTRAST TECHNIQUE: Multidetector CT imaging of the chest was performed during intravenous contrast administration. CONTRAST:  56m ISOVUE-300 IOPAMIDOL (ISOVUE-300) INJECTION 61% COMPARISON:  Radiographs 06/10/2017 and 05/24/2017.  CT 02/19/2017.  FINDINGS: Cardiovascular: Mild atherosclerosis of the aorta, great vessels and coronary arteries. There is extrinsic mass effect on the SVC, right internal jugular vein and pulmonary arteries by extensive mediastinal lymphadenopathy, but no evidence of large vessel occlusion or acute pulmonary embolism. The heart size is normal. There is no pericardial effusion. Mediastinum/Nodes: Patient has developed extensive mediastinal and supraclavicular adenopathy. Representative nodes include a 19 mm right supraclavicular node (image 8/series 2), a 17 mm left supraclavicular node (image 12/2), a 4.3 x 3.5 cm right paratracheal node (image 54) and a 3.2 x 8.2 cm subcarinal node (image 73). There is no axillary adenopathy. The thyroid gland, trachea and esophagus demonstrate no significant findings. Lungs/Pleura: Interval development of a large complex left pleural effusion with multiple loculations and pleural based nodules. As shown on recent radiographs, the left lung and central bronchi are completely collapsed. The right lung remains well aerated. There are right lung emphysematous changes and mild right apical scarring. No  suspicious right lung nodules. Upper abdomen: Stable low-density right adrenal nodule consistent with a benign adenoma based on stability. Developing right retrocrural and left celiac adenopathy. Musculoskeletal/Chest wall: There is no chest wall mass or suspicious osseous finding. There is paraspinal tumor in the left pleural space without definite neural foraminal or intraspinal extension. IMPRESSION: 1. Extensive mediastinal, supraclavicular and upper abdominal adenopathy consistent with recurrent small cell lung cancer. 2. There is extensive tumor throughout the left pleural space with a loculated large left pleural effusion and complete collapse of the left lung. 3. No new osseous findings.  Left pleural paraspinal tumor. Electronically Signed   By: Richardean Sale M.D.   On: 06/10/2017 15:17    STUDIES:  CT Chest 02/25>>Extensive mediastinal, supraclavicular and upper abdominal adenopathy consistent with recurrent small cell lung cancer. There is extensive tumor throughout the left pleural space with a loculated large left pleural effusion and complete collapse of the left lung. No new osseous findings.  Left pleural paraspinal tumor.  CULTURES: Blood x2 02/25>> Sputum 02/25>>  ANTIBIOTICS: Cefepime 02/25>> Vancomycin 02/25>>  LINES/TUBES: ETT 02/26>>  ASSESSMENT / PLAN:  PULMONARY A: Acute on chronic hypoxic respiratory failure secondary to AECOPD, Loculated Large Left Pleural Effusion, and Small Cell Lung Carcinoma Mechanical Intubation  Hx: Tobacco Abuse and Asthma  P:   Full vent support-vent settings reviewed and established SBT once all parameters met   VAP bundle implemented Scheduled and prn bronchodilator therapy  IV and nebulized steroids  Repeat CXR in am  Consider IR consult for palliative left thoracentesis and placement of pigtail catheter due to high likelihood of pleural effusion reoccurence    CARDIOVASCULAR A:  Atrial Fibrillation with RVR  P:  Continuous telemetry monitoring  Trend troponin's  Will avoid amiodarone for hr control will start prn metoprolol  Maintain map >65  Cardiology consulted appreciate input   RENAL A:   Hyperkalemia  P:   Trend BMP  Replace electrolytes as indicated  Monitor UOP   GASTROINTESTINAL A:   No acute issues  P:   Keep NPO for now  Pepcid for SUP   HEMATOLOGIC A:   CLL with extreme leukocytosis  Recurrent Small Cell Lung Carcinoma with extensive mediastinal and supraclavicular adenopathy New CNS metastasis  Hx: Thrombocytopenia  P:  Oncology consulted appreciate input  Subq heparin for VTE prophylaxis  Trend CBC Monitor for s/sx of bleeding and transfuse for hgb <7  INFECTIOUS A:   Extreme Leukocytosis secondary to possible left sided empyema (recently treated for pneumonia 2 weeks  ago) and CLL P:   Trend WBC and monitor fever curve  Trend PCT and lactic acid  Follow cultures Continue current abx  Will likely need left sided thoracentesis with cytology to r/o infection vs. malignancy   ENDOCRINE A:   Hyperglycemia    P:   CBG's q4hrs  Will add SSI   NEUROLOGIC A:   Newly dx CNS Metastasis  Mechanical Intubation Discomfort/Pain  Hx: CVA  P:   RASS goal: 0 to -1 Fentanyl gtt to maintain RASS goal and for pain management  WUA daily    FAMILY  - Updates: Attempted to contact pts son via telephone to discuss change in pt condition and transfer to ICU, however phone immediately went to voicemail will attempt to contact him again.   -Palliative Care consulted strongly recommend discussing goals of treatment and changing code status to DNR due to overall poor prognosis.  Marda Stalker, [place] Pager [phone] (please enter 7  digits) PCCM Consult Pager [phone] (please enter 7 digits)

## 2017-06-10 NOTE — ED Notes (Signed)
CODE  SEPSIS  CALLED  TO  CARELINK 

## 2017-06-10 NOTE — ED Triage Notes (Signed)
Pt ems from home with multiple complaints. SOB with hx of COPD and lung CA. 2 duonebs in route. Diarrhea x 3 days. Hx brain ca and scheduled to start chemo today. ?Dr. Donella Stade md at [place].

## 2017-06-10 NOTE — Progress Notes (Signed)
Called for [place].  Upon arrival, patient had not lost pulses and CPR was never performed.  She was however in significant respiratory failure.  Patient was oxygenated with bag mask valve for some time.  She was able to recover alertness enough to be able to speak briefly to physicians, but then began with agonal breathing again.  Intubation was performed by Dr. Owens Shark, ED physician.  Patient was transferred to ICU.    Patient arrived today and was found on admission to have significant left-sided pleural effusion with left-sided lung collapse.  She had remained somewhat stable up on the floor tonight, though she did have significant tachycardia, A. fib with RVR, through most of the day.  She had been given some doses of metoprolol without much success in controlling her heart rate.  Amiodarone was ordered tonight, and shortly thereafter is when she had her respiratory arrest.  Unclear   Acute reason for her respiratory arrest tonight.  She certainly is a set up for respiratory failure given her total left lung collapse.  There is initially some suspicion for possible aspiration as the patient on clinical exam had rhonchi in her right lower lobe.  However, chest x-ray did not show evidence of this.  Patient is currently in the ICU under intensivist care.  Jacqulyn Bath [facility] 06/11/2017, 2:29 AM

## 2017-06-10 NOTE — Progress Notes (Signed)
Pt's HR sustaining in the 150s-170s; notified MD Willis and orders placed to start pt on Amiodarone gtt. Last BP stable at 157/91 and patient sleeping. Nursing staff will continue to monitor for any changes in patient status. Earleen Reaper, RN

## 2017-06-10 NOTE — H&P (Signed)
[facility] [place] [facility] NAME: Sheri Ayala    MR#:  [id number]  DATE OF BIRTH:  [birth year]  DATE OF ADMISSION:  06/10/2017  PRIMARY CARE PHYSICIAN: Alvester Chou, NP   REQUESTING/REFERRING PHYSICIAN: veronese  CHIEF COMPLAINT:   Chief Complaint  Patient presents with  . Shortness of Breath    HISTORY OF PRESENT ILLNESS: Sheri Ayala  is a 67 y.o. female with a known history of anxiety, atrial fibrillation, bronchospasm, cerebrovascular accident, COPD, depression, CLL, small cell lung cancer in remission, recently diagnosed metastasis and brain- was admitted to hospital 2 weeks ago with pneumonia and at that time because of headache her CT scan and MRI of the brain was done which diagnosed a new metastasis in the brain. She was sent home with oral antibiotic which she finished and she was feeling fine. At baseline she walks with a walker and uses oxygen 24 hours a day. She had seen her oncologist last week and they were scheduling her to start radiation on her brain from today. Today at home she was walking and suddenly felt very dizzy and lost her balance and fell on the floor when she could not get up and so called ambulance. She was brought to emergency room and noted to be having A. fib with RVR up to 120 to 130. Blood pressure is stable. On chest x-ray pelvis complete white out of the left side of the lung where she had pneumonia 2 weeks ago. She denies complaint of chest pain, palpitation. Her headache continues.  PAST MEDICAL HISTORY:   Past Medical History:  Diagnosis Date  . Allergy   . 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 Anxiety   . Asthma   . Atrial fibrillation ([place]) 04/2016  . Brain metastases ([place]) 2019  . Bronchospasm   . Cerebrovascular accident (CVA) ([place]) 08/28/2015  . Chronic lymphocytic leukemia ([place]) 2004  . COPD (chronic obstructive pulmonary disease) ([place])   . Depression   . Emphysema of lung ([place])    . Frontal sinusitis November 2012  . H/O viral encephalitis   . Headache(784.0)   . Heart murmur   . Mitral prolapse 11/24/2015  . Mitral regurgitation 11/24/2015  . Olecranon bursitis of left elbow November 2012.   in past  . Pleurisy   . Pneumonia    13 times  . Rhinitis   . Small cell lung cancer ([place])   . Thrombocytopenia ([place])   . Tobacco abuse   . Tricuspid valve regurgitation 11/24/2015    PAST SURGICAL HISTORY:  Past Surgical History:  Procedure Laterality Date  . ABDOMINAL HYSTERECTOMY    . COLONOSCOPY    . UPPER GASTROINTESTINAL ENDOSCOPY    . VIDEO BRONCHOSCOPY Bilateral 04/18/2016   Procedure: VIDEO BRONCHOSCOPY WITHOUT FLUORO;  Surgeon: Tanda Rockers, MD;  Location: [facility];  Service: Cardiopulmonary;  Laterality: Bilateral;  . VIDEO BRONCHOSCOPY WITH ENDOBRONCHIAL ULTRASOUND N/A 07/23/2016   Procedure: VIDEO BRONCHOSCOPY WITH ENDOBRONCHIAL ULTRASOUND;  Surgeon: Grace Isaac, MD;  Location: [facility];  Service: Thoracic;  Laterality: N/A;    SOCIAL HISTORY:  Social History   Tobacco Use  . Smoking status: Former Smoker    Packs/day: 0.00    Years: 45.00    Pack years: 0.00  . Smokeless tobacco: Never Used  Substance Use Topics  . Alcohol use: No    Alcohol/week: 0.0 oz    FAMILY HISTORY:  Family History  Problem Relation  Age of Onset  . Aneurysm Mother   . Liver disease Father   . Congestive Heart Failure Brother   . Heart disease Brother   . Colon cancer Neg Hx   . Esophageal cancer Neg Hx   . Pancreatic cancer Neg Hx   . Rectal cancer Neg Hx   . Stomach cancer Neg Hx     DRUG ALLERGIES:  Allergies  Allergen Reactions  . Eliquis [Apixaban] Rash    Hives, and difficulty swallowing  . Cardizem Cd [Diltiazem Hcl Er Beads] Rash  . Citrus Other (See Comments)    Burning in stomach   . Penicillins Hives, Nausea And Vomiting, Swelling and Other (See Comments)    Patient reports tolerating ampicillin.  Has patient had a PCN reaction  causing immediate rash, facial/tongue/throat swelling, SOB or lightheadedness with hypotension: Yes Has patient had a PCN reaction causing severe rash involving mucus membranes or skin necrosis: unknown Has patient had a PCN reaction that required hospitalization No Has patient had a PCN reaction occurring within the last 10 years: No If all of the above answers are "NO", then may proceed with Cephalosporin use.     REVIEW OF SYSTEMS:   CONSTITUTIONAL: No fever, positive for fatigue or weakness.  EYES: No blurred or double vision.  EARS, NOSE, AND THROAT: No tinnitus or ear pain.  RESPIRATORY: No cough, have shortness of breath, no wheezing or hemoptysis.  CARDIOVASCULAR: No chest pain, orthopnea, edema.  GASTROINTESTINAL: No nausea, vomiting, diarrhea or abdominal pain.  GENITOURINARY: No dysuria, hematuria.  ENDOCRINE: No polyuria, nocturia,  HEMATOLOGY: No anemia, easy bruising or bleeding SKIN: No rash or lesion. MUSCULOSKELETAL: No joint pain or arthritis.   NEUROLOGIC: No tingling, numbness, weakness.  PSYCHIATRY: No anxiety or depression.   MEDICATIONS AT HOME:  Prior to Admission medications   Medication Sig Start Date End Date Taking? Authorizing Provider  albuterol (PROVENTIL) (2.5 MG/3ML) 0.083% nebulizer solution Take 3 mLs (2.5 mg total) by nebulization every 4 (four) hours as needed for wheezing or shortness of breath. 06/21/13  Yes Francine Graven, DO  ALPRAZolam Duanne Moron) 0.5 MG tablet Take 1 tablet (0.5 mg total) by mouth 2 (two) times daily as needed for anxiety or sleep. 04/22/17  Yes Ladell Pier, MD  budesonide-formoterol [facility]) 160-4.5 MCG/ACT inhaler Take 2 puffs first thing in am and then another 2 puffs about 12 hours later. 04/29/16  Yes Tanda Rockers, MD  clotrimazole (MYCELEX) 10 MG troche Take 1 tablet (10 mg total) by mouth 4 (four) times daily. Take while on decadron. 06/04/17  Yes Ladell Pier, MD  dexamethasone (DECADRON) 4 MG tablet Take 1  tablet (4 mg total) by mouth every 12 (twelve) hours. 05/27/17 06/26/17 Yes Gladstone Lighter, MD  fluconazole (DIFLUCAN) 100 MG tablet Take 1 tablet (100 mg total) by mouth daily. Take for 4 days. 06/04/17  Yes Ladell Pier, MD  HYDROcodone-acetaminophen (NORCO) 5-325 MG tablet Take 1 tablet by mouth every 6 (six) hours as needed for severe pain. 06/04/17  Yes Ladell Pier, MD  OXYGEN Inhale 2 L into the lungs as needed (for shortness of breath).    Yes [provider]  pantoprazole (PROTONIX) 40 MG tablet Take 40 mg daily by mouth. 02/11/17  Yes [provider]  rizatriptan (MAXALT) 10 MG tablet Take 10 mg by mouth as needed.  05/17/17  Yes [provider]  sertraline (ZOLOFT) 25 MG tablet Take 25 mg by mouth at bedtime.  04/05/16  Yes [provider]  temazepam (RESTORIL) 30 MG capsule Take 1 capsule (30 mg total) by mouth at bedtime. 05/05/16  Yes Tat, Shanon Brow, MD  topiramate (TOPAMAX) 50 MG tablet Take 1 tablet by mouth daily. 05/17/17  Yes [provider]  VENTOLIN HFA 108 (90 Base) MCG/ACT inhaler Inhale 2 puffs into the lungs every 4 (four) hours as needed for wheezing or shortness of breath.  07/05/16  Yes [provider]  feeding supplement, ENSURE ENLIVE, (ENSURE ENLIVE) LIQD Take 237 mLs by mouth 2 (two) times daily between meals. 05/06/16   Orson Eva, MD  polyethylene glycol (MIRALAX / Floria Raveling) packet Take 17 g by mouth 2 (two) times daily. Patient not taking: Reported on 04/04/2017 07/26/16   Bonnielee Haff, MD  prochlorperazine (COMPAZINE) 10 MG tablet Take 0.5 tablets (5 mg total) by mouth every 6 (six) hours as needed for nausea or vomiting. Patient not taking: Reported on 04/20/2017 10/08/16   Ladell Pier, MD      PHYSICAL EXAMINATION:   VITAL SIGNS: Blood pressure 126/71, pulse (!) 121, temperature 97.6 F (36.4 C), temperature source Oral, resp. rate (!) 25, height 5' 9.5" (1.765 m), weight 54.4 kg (120 lb), SpO2 97  %.  GENERAL:  67 y.o.-year-old patient lying in the bed with no acute distress.  EYES: Pupils equal, round, reactive to light and accommodation. No scleral icterus. Extraocular muscles intact.  HEENT: Head atraumatic, normocephalic. Oropharynx and nasopharynx clear.  NECK:  Supple, no jugular venous distention. No thyroid enlargement, no tenderness.  LUNGS: Decreased breath sounds on left side, some wheezing, no crepitation. Positive use of accessory muscles of respiration. Requiring nasal cannula oxygen. CARDIOVASCULAR: S1, S2 fast and regular. No murmurs, rubs, or gallops.  ABDOMEN: Soft, nontender, nondistended. Bowel sounds present. No organomegaly or mass.  EXTREMITIES: No pedal edema, cyanosis, or clubbing.  NEUROLOGIC: Cranial nerves II through XII are intact. Muscle strength 5/5 in all extremities. Sensation intact. Gait not checked.  PSYCHIATRIC: The patient is alert and oriented x 3.  SKIN: No obvious rash, lesion, or ulcer.   LABORATORY PANEL:   CBC Recent Labs  Lab 06/10/17 1214  WBC 70.3*  HGB 13.4  HCT 44.7  PLT 222  MCV 86.6  MCH 26.1  MCHC 30.1*  RDW 19.3*  LYMPHSABS 46.4*  MONOABS 1.4*  EOSABS 0.0  BASOSABS 0.0   ------------------------------------------------------------------------------------------------------------------  Chemistries  Recent Labs  Lab 06/10/17 1214  NA 132*  K 5.7*  CL 89*  CO2 33*  GLUCOSE 172*  BUN 53*  CREATININE 0.78  CALCIUM 8.8*  MG 2.2  AST 48*  ALT 84*  ALKPHOS 85  BILITOT 0.8   ------------------------------------------------------------------------------------------------------------------ estimated creatinine clearance is 58.6 mL/min (by C-G formula based on SCr of 0.78 mg/dL). ------------------------------------------------------------------------------------------------------------------ No results for input(s): TSH, T4TOTAL, T3FREE, THYROIDAB in the last 72 hours.  Invalid input(s): FREET3   Coagulation  profile No results for input(s): INR, PROTIME in the last 168 hours. ------------------------------------------------------------------------------------------------------------------- No results for input(s): DDIMER in the last 72 hours. -------------------------------------------------------------------------------------------------------------------  Cardiac Enzymes No results for input(s): CKMB, TROPONINI, MYOGLOBIN in the last 168 hours.  Invalid input(s): CK ------------------------------------------------------------------------------------------------------------------ Invalid input(s): POCBNP  ---------------------------------------------------------------------------------------------------------------  Urinalysis    Component Value Date/Time   COLORURINE YELLOW (A) 05/24/2017 1030   APPEARANCEUR HAZY (A) 05/24/2017 1030   LABSPEC 1.035 (H) 05/24/2017 1030   PHURINE 5.0 05/24/2017 1030   GLUCOSEU NEGATIVE 05/24/2017 1030   HGBUR NEGATIVE 05/24/2017 1030   BILIRUBINUR NEGATIVE 05/24/2017 1030   [place] 05/24/2017 1030  PROTEINUR NEGATIVE 05/24/2017 1030   UROBILINOGEN 0.2 01/06/2015 1843   NITRITE NEGATIVE 05/24/2017 1030   LEUKOCYTESUR NEGATIVE 05/24/2017 1030     RADIOLOGY: Dg Chest 2 View  Result Date: 06/10/2017 CLINICAL DATA:  Shortness of breath. History of COPD and lung cancer. History of brain cancer scheduled to start chemotherapy today. EXAM: CHEST  2 VIEW COMPARISON:  Chest x-ray dated 05/24/2017. FINDINGS: Complete opacification of the left chest, presumably large pleural effusion. Associated slight rightward shift of the cardiomediastinal structures. Right lung is clear. No acute or suspicious osseous finding. IMPRESSION: New complete opacification of the left hemithorax, presumably due to large pleural effusion. Electronically Signed   By: Franki Cabot M.D.   On: 06/10/2017 13:36    EKG: Orders placed or performed during the hospital  encounter of 06/10/17  . ED EKG 12-Lead  . ED EKG 12-Lead    IMPRESSION AND PLAN:  * Acute on chronic respiratory failure, sepsis   Healthcare associated pneumonia   Likely left-sided pleural effusion    Recently treated for pneumonia 2 weeks ago.   Now presented with the same side complete white out of the lung   I will treat with broad-spectrum antibiotics until confirmed by cultures.   Her MRSA screen was positive last time.   CT scan of the lung to differentiate between lung collapse secondary to possible mass versus infection and pleural effusion.   If there is large pleural effusion then she may need to have thoracentesis with labs to rule out infection versus malignancy.  * Elevated white cell count   Her white blood cell count is significantly high with disproportionately high lymphocytes   Has history of CLL, I will call oncology consult.  * Hyperkalemia   Will give one dose Kayexalate and follow tomorrow.  * A. fib with RVR   Will monitor on telemetry, cardiology consult, echocardiogram.  * Lactic acidosis   Monitor with IV fluids.  * Brain metastasis   Recently diagnosed and plan was to start radiation therapy from today, I will call oncology consult.  * History of COPD   DuoNeb nebulizer treatment, currently no active wheezing.   All the records are reviewed and case discussed with ED provider. Management plans discussed with the patient, family and they are in agreement.  CODE STATUS: Full code Code Status History    Date Active Date Inactive Code Status Order ID Comments User Context   05/24/2017 13:12 05/27/2017 15:06 DNR [id number]  Vaughan Basta, MD ED   07/15/2016 20:20 07/27/2016 18:40 Full Code [id number]  Vianne Bulls, MD ED   04/30/2016 20:16 05/07/2016 18:25 Full Code [id number]  Barton Dubois, MD Inpatient   04/18/2016 10:37 04/19/2016 16:02 Full Code [id number]  Erick Colace, NP Inpatient   01/29/2015 20:59 01/31/2015 17:21 Full Code [id number]   Henreitta Leber, MD Inpatient   12/22/2012 21:21 12/25/2012 17:35 Full Code [id number]  Rise Patience, MD Inpatient   03/27/2011 17:44 03/29/2011 17:01 Full Code [id number]  Rexene Alberts, MD ED   03/09/2011 23:04 03/12/2011 17:41 Full Code [id number]  Ron Parker, RN Inpatient    Questions for Most Recent Historical Code Status (Order [id number])    Question Answer Comment   In the event of cardiac or respiratory ARREST Do not call a "code blue"    In the event of cardiac or respiratory ARREST Do not perform Intubation, CPR, defibrillation or ACLS    In the event of cardiac or respiratory ARREST Use medication by any  route, position, wound care, and other measures to relive pain and suffering. May use oxygen, suction and manual treatment of airway obstruction as needed for comfort.    Comments confirmed with pt.         Advance Directive Documentation     Most Recent Value  Type of Advance Directive  Healthcare Power of Attorney  Pre-existing out of facility DNR order (yellow form or pink MOST form)  No data  "MOST" Form in Place?  No data       TOTAL TIME TAKING CARE OF THIS PATIENT: 50 minutes.    Vaughan Basta M.D on 06/10/2017   Between 7am to 6pm - Pager - [phone]  After 6pm go to [email or web address] - password [email or web address] [facility]  Office  [phone]  CC: Primary care physician; Alvester Chou, NP   Note: This dictation was prepared with Dragon dictation along with smaller phrase technology. Any transcriptional errors that result from this process are unintentional.

## 2017-06-10 NOTE — Consult Note (Signed)
Hematology/Oncology Consult note [facility] Telephone:([phone] [phone]  Patient Care Team: Alvester Chou, NP as PCP - General (Nurse Practitioner)   Name of the patient: Sheri Ayala  [id number]  27-Jun-1950   Date of visit: 06/10/17 REASON FOR COSULTATION:  Hx of CLL, treated Smallc ell lung cancer and now new mets in brain. have WBCs 70,000. History of presenting illness- 67.yo female with history of small cell lung cancer, treatment with chemotherapy, recently found to have CNS metastasis. She also has history of CLL and has been on watchful waiting. For her newly found CNC mets, she has been started on Decadron, and plan to be started on radiation. Recent left lower lobe pneumonia, diagnosed on 05/24/2017.    Patient presented today to ER due to dizziness and lost balance and fell at home. She was found to be in A Fib with RVR, x ray showed  complete opacification of the left hemithorax. Her WBC also significantly elevated. Admitting physician Dr.Vachhani is concerned about transformation to acute phase leukemia and oncology was consulted.   Patient appears in mild respiratory stress. Reports feeling fatigue, SOB. No headache, chest pain.   Review of systems- Review of Systems  Constitutional: Positive for malaise/fatigue. Negative for chills and fever.  HENT: Negative for nosebleeds.   Eyes: Negative for double vision and discharge.  Respiratory: Positive for shortness of breath. Negative for hemoptysis.   Cardiovascular: Negative for chest pain and leg swelling.  Gastrointestinal: Negative for abdominal pain, nausea and vomiting.  Genitourinary: Negative for dysuria.  Musculoskeletal: Negative for myalgias.  Skin: Negative for rash.  Neurological: Positive for weakness. Negative for dizziness and tingling.  Endo/Heme/Allergies: Does not bruise/bleed easily.  Psychiatric/Behavioral: The patient is nervous/anxious.     Allergies  Allergen  Reactions  . Eliquis [Apixaban] Rash    Hives, and difficulty swallowing  . Cardizem Cd [Diltiazem Hcl Er Beads] Rash  . Citrus Other (See Comments)    Burning in stomach   . Penicillins Hives, Nausea And Vomiting, Swelling and Other (See Comments)    Patient reports tolerating ampicillin.  Has patient had a PCN reaction causing immediate rash, facial/tongue/throat swelling, SOB or lightheadedness with hypotension: Yes Has patient had a PCN reaction causing severe rash involving mucus membranes or skin necrosis: unknown Has patient had a PCN reaction that required hospitalization No Has patient had a PCN reaction occurring within the last 10 years: No If all of the above answers are "NO", then may proceed with Cephalosporin use.     Patient Active Problem List   Diagnosis Date Noted  . Fall at home, initial encounter 06/10/2017  . Atrial fibrillation with RVR ([place]) 06/10/2017  . Pneumonia 06/10/2017  . Acute on chronic respiratory failure ([place]) 05/24/2017  . Brain metastases ([place]) 05/24/2017  . PICC (peripherally inserted central catheter) flush 09/05/2016  . Port catheter in place 08/15/2016  . Small cell lung cancer, left ([place]) 07/25/2016  . Lobar pneumonia ([place]) 07/20/2016  . Hilar adenopathy 07/20/2016  . Community acquired pneumonia 07/16/2016  . Leukocytosis 07/16/2016  . Obstructive atelectasis 07/15/2016  . Pleural effusion   . S/P thoracentesis   . Pleural effusion on right   . History of CVA (cerebrovascular accident) 05/01/2016  . HCAP (healthcare-associated pneumonia) 04/30/2016  . Chronic a-fib ([place]) 04/30/2016  . Severe protein-calorie malnutrition ([place])   . Mass of left lung 04/10/2016  . Acute on chronic respiratory failure with hypoxia ([place]) 01/30/2015  . Right lower lobe pneumonia ([place]) 01/30/2015  .  SOB (shortness of breath) 12/24/2012  . COPD with acute exacerbation ([place]) 12/22/2012  . Headache in front of head 03/27/2011  . Thrombocytopenia ([place])  03/27/2011  . CLL (chronic lymphocytic leukemia) ([place]) 03/09/2011  . Cigarette smoker 03/09/2011     Past Medical History:  Diagnosis Date  . Allergy   . 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 Anxiety   . Asthma   . Atrial fibrillation ([place]) 04/2016  . Brain metastases ([place]) 2019  . Bronchospasm   . Cerebrovascular accident (CVA) ([place]) 08/28/2015  . Chronic lymphocytic leukemia ([place]) 2004  . COPD (chronic obstructive pulmonary disease) ([place])   . Depression   . Emphysema of lung ([place])   . Frontal sinusitis November 2012  . H/O viral encephalitis   . Headache(784.0)   . Heart murmur   . Mitral prolapse 11/24/2015  . Mitral regurgitation 11/24/2015  . Olecranon bursitis of left elbow November 2012.   in past  . Pleurisy   . Pneumonia    13 times  . Rhinitis   . Small cell lung cancer ([place])   . Thrombocytopenia ([place])   . Tobacco abuse   . Tricuspid valve regurgitation 11/24/2015     Past Surgical History:  Procedure Laterality Date  . ABDOMINAL HYSTERECTOMY    . COLONOSCOPY    . UPPER GASTROINTESTINAL ENDOSCOPY    . VIDEO BRONCHOSCOPY Bilateral 04/18/2016   Procedure: VIDEO BRONCHOSCOPY WITHOUT FLUORO;  Surgeon: Tanda Rockers, MD;  Location: [facility] ENDOSCOPY;  Service: Cardiopulmonary;  Laterality: Bilateral;  . VIDEO BRONCHOSCOPY WITH ENDOBRONCHIAL ULTRASOUND N/A 07/23/2016   Procedure: VIDEO BRONCHOSCOPY WITH ENDOBRONCHIAL ULTRASOUND;  Surgeon: Grace Isaac, MD;  Location: [facility] OR;  Service: Thoracic;  Laterality: N/A;    Social History   Socioeconomic History  . Marital status: Single    Spouse name: Not on file  . Number of children: Not on file  . Years of education: Not on file  . Highest education level: Not on file  Social Needs  . Financial resource strain: Not on file  . Food insecurity - worry: Not on file  . Food insecurity - inability: Not on file  . Transportation needs - medical: Not on file  . Transportation needs -  non-medical: Not on file  Occupational History  . Not on file  Tobacco Use  . Smoking status: Former Smoker    Packs/day: 0.00    Years: 45.00    Pack years: 0.00  . Smokeless tobacco: Never Used  Substance and Sexual Activity  . Alcohol use: No    Alcohol/week: 0.0 oz  . Drug use: No  . Sexual activity: Not Currently  Other Topics Concern  . Not on file  Social History Narrative  . Not on file     Family History  Problem Relation Age of Onset  . Aneurysm Mother   . Liver disease Father   . Congestive Heart Failure Brother   . Heart disease Brother   . Colon cancer Neg Hx   . Esophageal cancer Neg Hx   . Pancreatic cancer Neg Hx   . Rectal cancer Neg Hx   . Stomach cancer Neg Hx      Current Facility-Administered Medications:  .  0.9 %  sodium chloride infusion, , Intravenous, Continuous, Vaughan Basta, MD, Last Rate: 75 mL/hr at 06/10/17 1723 .  albuterol (PROVENTIL) (2.5 MG/3ML) 0.083% nebulizer solution 2.5 mg, 2.5 mg, Nebulization, Q4H PRN, Vaughan Basta, MD .  ALPRAZolam (  XANAX) tablet 0.5 mg, 0.5 mg, Oral, BID PRN, Vaughan Basta, MD .  ceFEPIme (MAXIPIME) 2 g in sodium chloride 0.9 % 100 mL IVPB, 2 g, Intravenous, Q12H, Lenis Noon, RPH .  dexamethasone (DECADRON) tablet 4 mg, 4 mg, Oral, Q12H, Lenis Noon, RPH .  docusate sodium (COLACE) capsule 100 mg, 100 mg, Oral, BID PRN, Vaughan Basta, MD .  feeding supplement (ENSURE ENLIVE) (ENSURE ENLIVE) liquid 237 mL, 237 mL, Oral, BID BM, Vaughan Basta, MD, 237 mL at 06/10/17 1724 .  heparin injection 5,000 Units, 5,000 Units, Subcutaneous, Q8H, Vaughan Basta, MD .  HYDROcodone-acetaminophen (NORCO/VICODIN) 5-325 MG per tablet 1 tablet, 1 tablet, Oral, Q6H PRN, Vaughan Basta, MD .  ipratropium-albuterol (DUONEB) 0.5-2.5 (3) MG/3ML nebulizer solution 3 mL, 3 mL, Nebulization, Q4H, Vaughan Basta, MD, 3 mL at 06/10/17 2030 .  metoprolol succinate  (TOPROL-XL) 24 hr tablet 50 mg, 50 mg, Oral, BID, Vaughan Basta, MD .  pantoprazole (PROTONIX) EC tablet 40 mg, 40 mg, Oral, Daily, Vaughan Basta, MD, 40 mg at 06/10/17 1724 .  sertraline (ZOLOFT) tablet 25 mg, 25 mg, Oral, QHS, Vaughan Basta, MD .  SUMAtriptan (IMITREX) tablet 50 mg, 50 mg, Oral, Q2H PRN, Vaughan Basta, MD .  temazepam (RESTORIL) capsule 30 mg, 30 mg, Oral, QHS, Vaughan Basta, MD .  topiramate (TOPAMAX) tablet 50 mg, 50 mg, Oral, Daily, Vaughan Basta, MD, 50 mg at 06/10/17 1723 .  vancomycin (VANCOCIN) IVPB 750 mg/150 ml premix, 750 mg, Intravenous, Q18H, Lenis Noon, [facility]   Physical exam:  Vitals:   06/10/17 1638 06/10/17 1736 06/10/17 2030 06/10/17 2043  BP: 129/73   140/81  Pulse: (!) 108  (!) 125 (!) 138  Resp: 18  17   Temp: 97.9 F (36.6 C)   97.8 F (36.6 C)  TempSrc: Oral     SpO2: 92% 98% 92% 90%  Weight:      Height:       GENERAL:Alert, mild respiratory distress.  EYES: no pallor or icterus OROPHARYNX: no thrush or ulceration; good dentition  NECK: supple, no masses felt LUNGS: tachypneic, +wheezing, decreased breath sound on left. Using accessory muscles.   HEART/CVS: no murmurs; No lower extremity edema ABDOMEN: abdomen soft, non-tender and normal bowel sounds Musculoskeletal:no cyanosis of digits and no clubbing  PSYCH: alert & oriented x 3  NEURO: no focal motor/sensory deficits SKIN:  no rashes or significant lesions     CMP Latest Ref Rng & Units 06/10/2017  Glucose 65 - 99 mg/dL 172(H)  BUN 6 - 20 mg/dL 53(H)  Creatinine 0.44 - 1.00 mg/dL 0.78  Sodium 135 - 145 mmol/L 132(L)  Potassium 3.5 - 5.1 mmol/L 5.7(H)  Chloride 101 - 111 mmol/L 89(L)  CO2 22 - 32 mmol/L 33(H)  Calcium 8.9 - 10.3 mg/dL 8.8(L)  Total Protein 6.5 - 8.1 g/dL 6.4(L)  Total Bilirubin 0.3 - 1.2 mg/dL 0.8  Alkaline Phos 38 - 126 U/L 85  AST 15 - 41 U/L 48(H)  ALT 14 - 54 U/L 84(H)   CBC Latest Ref Rng &  Units 06/10/2017  WBC 3.6 - 11.0 K/uL 70.3(HH)  Hemoglobin 12.0 - 16.0 g/dL 13.4  Hematocrit 35.0 - 47.0 % 44.7  Platelets 150 - 440 K/uL 222     Dg Chest 2 View  Result Date: 06/10/2017 CLINICAL DATA:  Shortness of breath. History of COPD and lung cancer. History of brain cancer scheduled to start chemotherapy today. EXAM: CHEST  2 VIEW COMPARISON:  Chest x-ray dated 05/24/2017.  FINDINGS: Complete opacification of the left chest, presumably large pleural effusion. Associated slight rightward shift of the cardiomediastinal structures. Right lung is clear. No acute or suspicious osseous finding. IMPRESSION: New complete opacification of the left hemithorax, presumably due to large pleural effusion. Electronically Signed   By: Franki Cabot M.D.   On: 06/10/2017 13:36   Dg Chest 2 View  Result Date: 05/24/2017 CLINICAL DATA:  Shortness of Breath EXAM: CHEST  2 VIEW COMPARISON:  04/20/2017 FINDINGS: Cardiac shadow is stable. The right lung is clear. Diffuse left lower lobe infiltrate is noted consistent with pneumonia. No sizable effusion is seen. No bony abnormality is noted. IMPRESSION: Diffuse left lower lobe infiltrate. Electronically Signed   By: Inez Catalina M.D.   On: 05/24/2017 07:20   Ct Head W Or Wo Contrast  Result Date: 05/24/2017 CLINICAL DATA:  67 year old female with 1 week of severe headache. No known injury. 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 EXAM: CT HEAD WITHOUT AND WITH CONTRAST TECHNIQUE: Contiguous axial images were obtained from the base of the skull through the vertex without and with intravenous contrast CONTRAST:  21m ISOVUE-300 IOPAMIDOL (ISOVUE-300) INJECTION 61% COMPARISON:  05/02/2017 head CT, brain MRI 07/24/2016 and earlier. FINDINGS: Brain: Stable cerebral volume. No midline shift, ventriculomegaly, mass effect, intracranial hemorrhage or evidence of cortically based acute infarction. No abnormal enhancement identified. Asymmetric hypodensity in the left  cerebellum is new since the head CT 09/29/2015, and not correlated on the 2018 brain MRI. This is increased in conspicuity since January. There is no associated mass effect or enhancement. The caudal aspect of this area has a curvilinear morphology raising the possibility of encephalomalacia (series 2, image 5). Elsewhere stable gray-white matter differentiation throughout the brain, with chronic nonspecific cerebral white matter hypodensity. Vascular: Mild Calcified atherosclerosis at the skull base. Major intracranial vascular structures are enhancing. Skull: Bone mineralization remains stable. No acute or suspicious osseous lesion identified. Sinuses/Orbits: Visualized paranasal sinuses and mastoids are stable and well pneumatized. Other: Visualized orbits and scalp soft tissues are within normal limits. IMPRESSION: 1. Nonspecific hypodense area in the left cerebellum has developed since 2018. No associated enhancement or mass effect to strongly suggest metastatic disease. This might be a late subacute cerebellar infarct. Recommend Brain MRI without and with contrast to further characterize. 2. No other acute finding. Electronically Signed   By: HGenevie AnnM.D.   On: 05/24/2017 08:23   Ct Chest W Contrast  Result Date: 06/10/2017 CLINICAL DATA:  Metastatic brain lesion. Recent pneumonia. History of COPD and small cell lung cancer. Progressive weakness, cough and shortness of breath. EXAM: CT CHEST WITH CONTRAST TECHNIQUE: Multidetector CT imaging of the chest was performed during intravenous contrast administration. CONTRAST:  751mISOVUE-300 IOPAMIDOL (ISOVUE-300) INJECTION 61% COMPARISON:  Radiographs 06/10/2017 and 05/24/2017.  CT 02/19/2017. FINDINGS: Cardiovascular: Mild atherosclerosis of the aorta, great vessels and coronary arteries. There is extrinsic mass effect on the SVC, right internal jugular vein and pulmonary arteries by extensive mediastinal lymphadenopathy, but no evidence of large vessel  occlusion or acute pulmonary embolism. The heart size is normal. There is no pericardial effusion. Mediastinum/Nodes: Patient has developed extensive mediastinal and supraclavicular adenopathy. Representative nodes include a 19 mm right supraclavicular node (image 8/series 2), a 17 mm left supraclavicular node (image 12/2), a 4.3 x 3.5 cm right paratracheal node (image 54) and a 3.2 x 8.2 cm subcarinal node (image 73). There is no axillary adenopathy. The thyroid gland, trachea and esophagus demonstrate no significant findings. Lungs/Pleura:  Interval development of a large complex left pleural effusion with multiple loculations and pleural based nodules. As shown on recent radiographs, the left lung and central bronchi are completely collapsed. The right lung remains well aerated. There are right lung emphysematous changes and mild right apical scarring. No suspicious right lung nodules. Upper abdomen: Stable low-density right adrenal nodule consistent with a benign adenoma based on stability. Developing right retrocrural and left celiac adenopathy. Musculoskeletal/Chest wall: There is no chest wall mass or suspicious osseous finding. There is paraspinal tumor in the left pleural space without definite neural foraminal or intraspinal extension. IMPRESSION: 1. Extensive mediastinal, supraclavicular and upper abdominal adenopathy consistent with recurrent small cell lung cancer. 2. There is extensive tumor throughout the left pleural space with a loculated large left pleural effusion and complete collapse of the left lung. 3. No new osseous findings.  Left pleural paraspinal tumor. Electronically Signed   By: Richardean Sale M.D.   On: 06/10/2017 15:17   Mr Jeri Cos JO Contrast  Result Date: 05/24/2017 CLINICAL DATA:  Acute headache.  History of lung cancer. EXAM: MRI HEAD WITHOUT AND WITH CONTRAST TECHNIQUE: Multiplanar, multiecho pulse sequences of the brain and surrounding structures were obtained without and  with intravenous contrast. CONTRAST:  28m MULTIHANCE GADOBENATE DIMEGLUMINE 529 MG/ML IV SOLN COMPARISON:  Head CT from earlier today.  Brain MRI 07/24/2016 FINDINGS: Brain: The left inferior cerebellar abnormality reflects an enhancing superficial mass measuring up to 17 mm. The mass splays folia but is still likely intra-axial. No second lesion is seen. No infarct, hemorrhage, hydrocephalus, or collection. Chronic white matter disease, usually microvascular ischemic. Vascular: 3 mm enhancing outpouching extending inferiorly from the right MCA bifurcation. Skull and upper cervical spine: No noted metastatic lesion. Sinuses/Orbits: No visible marrow lesion. Other: These results were called by telephone at the time of interpretation on 05/24/2017 at 10:46 am to Dr. VVaughan Basta, who verbally acknowledged these results. IMPRESSION: 1. 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 2. 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 At follow-up brain MRI a MRA is recommended. Electronically Signed   By: JMonte FantasiaM.D.   On: 05/24/2017 11:11    Assessment and plan- Patient is a 67y.o. female with history small cell lung cancer, previously treated and recent newly found brain metastasis, currently admitted due to weakness s/p fall. CXR revealed complete opacification of left thorax.   # Acute on chronic respiratory failure:  CT chest results came back after I met patient. Unfortunately, she has recurrent small cell lung cancer with extensive mediastinal and supraclavicular adenopathy, large complex left pleural effusion with multiple loculations and pleural nodules.  Recommend consult IR for palliative thoracentesis and placement of pigtail catheter given the likely hood of fast re-accumulation of pleural effusion. Send pleural fluid for cytology.   # Recurrent small cell lung cancer: poor prognosis, last chemotherapy was on 11/21/2017, recurrent disease around 6 months. Hard  to evaluate performance status in acute respiratory setting. Will need to discuss with patient about goal of care and possobe available treatment options, including re challege with [place] Etoposide, or second line treatments including immunotherapy, topotecan, etc.   # CLL with extreme leukocytosis: likely secondary recent steroid use and reactive to tumor recurrence. Continue supportive care.  Will continue follow her inpatient course.  Thank you for involving me in this patient's care.   ZEarlie Server MD, PhD Hematology Oncology [facility]at ATrustpoint HospitalPager- [phone]/25/2019

## 2017-06-10 NOTE — ED Notes (Signed)
Oncology was here to see so delayed transport to room.  Then patient had to urinate.  I rolled bed to toilet and she voided, but then said she felt hot and weak.  She got back to the bed, and her blood pressure is 150/80 with pulse 116.  tranported to floor and steve RN notified of weakness. Patient tranferred stretcher to bed.

## 2017-06-10 NOTE — Progress Notes (Signed)
Family Meeting Note  Advance Directive:yes  Today a meeting took place with the Patient.  The following clinical team members were present during this meeting:MD  The following were discussed:Patient's diagnosis: CLL, lung cancer, metastases to the brain, COPD , Patient's progosis: Unable to determine and Goals for treatment: Full Code  Additional follow-up to be provided: Oncology, Pulmonary, cardiology.  Time spent during discussion:20 minutes  Vaughan Basta, MD

## 2017-06-10 NOTE — Progress Notes (Signed)
Pt's HR sustaining in the 120s-130s; MD Anselm Jungling notified and MD modified existing order for PO metoprolol to 2 times daily. Nursing staff will continue to monitor for any changes in patient status. Earleen Reaper, RN

## 2017-06-10 NOTE — ED Provider Notes (Signed)
[facility] Emergency Department Provider Note  ____________________________________________  Time seen: Approximately 12:10 PM  I have reviewed the triage vital signs and the nursing notes.   HISTORY  Chief Complaint Shortness of Breath   HPI Sheri Ayala is a 67 y.o. female with a history of recently diagnosed with metastatic brain lesion, recent PNA 2 weeks ago, paroxysmal afib, COPD who presents for evaluation of generalized weakness. Patient reports one week of progressively worsening generalized weakness associated with a dry cough, 3-4 daily episodes of watery diarrhea and one daily episode of nonbloody nonbilious emesis. For the last 2 days she has had a low-grade fever with MAXIMUM TEMPERATURE of 100.8F. patient reports that today she was walking in her house and felt very dizzy and weak. She lowered herself to the ground. She was unable to stand up and called EMS. She is also complaining of progressively worsening shortness of breath which is now constant, mild at rest, in moderate with minimal exertion. Per EMS patient was satting 80% on room air however she has a 2 L oxygen requirement at baseline. Patient is not on any blood thinners or rate control agents for her A. fib. She denies any head trauma or LOC. She endorses decreased appetite. She is not currently on chemotherapy and has her first radiation therapy scheduled tomorrow.no chest pain, no abdominal pain, no sore throat, no dysuria.  Past Medical History:  Diagnosis Date  . Allergy   . 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 Anxiety   . Asthma   . Atrial fibrillation ([place]) 04/2016  . Bronchospasm   . Cerebrovascular accident (CVA) ([place]) 08/28/2015  . Chronic lymphocytic leukemia ([place]) 2004  . COPD (chronic obstructive pulmonary disease) ([place])   . Depression   . Emphysema of lung ([place])   . Frontal sinusitis November 2012  . H/O viral encephalitis   .  Headache(784.0)   . Heart murmur   . Mitral prolapse 11/24/2015  . Mitral regurgitation 11/24/2015  . Olecranon bursitis of left elbow November 2012.   in past  . Pleurisy   . Pneumonia    13 times  . Rhinitis   . Thrombocytopenia ([place])   . Tobacco abuse   . Tricuspid valve regurgitation 11/24/2015    Patient Active Problem List   Diagnosis Date Noted  . Acute on chronic respiratory failure ([place]) 05/24/2017  . Brain metastases ([place]) 05/24/2017  . PICC (peripherally inserted central catheter) flush 09/05/2016  . Port catheter in place 08/15/2016  . 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 Lobar pneumonia ([place]) 07/20/2016  . Hilar adenopathy 07/20/2016  . Community acquired pneumonia 07/16/2016  . Leukocytosis 07/16/2016  . Obstructive atelectasis 07/15/2016  . S/P thoracentesis   . Pleural effusion on right   . History of CVA (cerebrovascular accident) 05/01/2016  . HCAP (healthcare-associated pneumonia) 04/30/2016  . Chronic a-fib ([place]) 04/30/2016  . Severe protein-calorie malnutrition ([place])   . Mass of left lung 04/10/2016  . Acute on chronic respiratory failure with hypoxia ([place]) 01/30/2015  . Right lower lobe pneumonia ([place]) 01/30/2015  . SOB (shortness of breath) 12/24/2012  . COPD with acute exacerbation ([place]) 12/22/2012  . Headache in front of head 03/27/2011  . Thrombocytopenia ([place]) 03/27/2011  . CLL (chronic lymphocytic leukemia) ([place]) 03/09/2011  . Cigarette smoker 03/09/2011    Past Surgical History:  Procedure Laterality Date  . ABDOMINAL HYSTERECTOMY    . COLONOSCOPY    .  UPPER GASTROINTESTINAL ENDOSCOPY    . VIDEO BRONCHOSCOPY Bilateral 04/18/2016   Procedure: VIDEO BRONCHOSCOPY WITHOUT FLUORO;  Surgeon: Tanda Rockers, MD;  Location: [facility] ENDOSCOPY;  Service: Cardiopulmonary;  Laterality: Bilateral;  . VIDEO BRONCHOSCOPY WITH ENDOBRONCHIAL ULTRASOUND N/A 07/23/2016   Procedure: VIDEO BRONCHOSCOPY WITH ENDOBRONCHIAL ULTRASOUND;  Surgeon: Grace Isaac, MD;  Location: [facility];  Service: Thoracic;  Laterality: N/A;    Prior to Admission medications   Medication Sig Start Date End Date Taking? Authorizing Provider  albuterol (PROVENTIL) (2.5 MG/3ML) 0.083% nebulizer solution Take 3 mLs (2.5 mg total) by nebulization every 4 (four) hours as needed for wheezing or shortness of breath. 06/21/13   Francine Graven, DO  ALPRAZolam Duanne Moron) 0.5 MG tablet Take 1 tablet (0.5 mg total) by mouth 2 (two) times daily as needed for anxiety or sleep. 04/22/17   Ladell Pier, MD  budesonide-formoterol (SYMBICORT) 160-4.5 MCG/ACT inhaler Take 2 puffs first thing in am and then another 2 puffs about 12 hours later. 04/29/16   Tanda Rockers, MD  clotrimazole (MYCELEX) 10 MG troche Take 1 tablet (10 mg total) by mouth 4 (four) times daily. Take while on decadron. 06/04/17   Ladell Pier, MD  dexamethasone (DECADRON) 4 MG tablet Take 1 tablet (4 mg total) by mouth every 12 (twelve) hours. 05/27/17 06/26/17  Gladstone Lighter, MD  doxycycline (VIBRA-TABS) 100 MG tablet  06/03/17   [provider]  feeding supplement, ENSURE ENLIVE, (ENSURE ENLIVE) LIQD Take 237 mLs by mouth 2 (two) times daily between meals. 05/06/16   Orson Eva, MD  fluconazole (DIFLUCAN) 100 MG tablet Take 1 tablet (100 mg total) by mouth daily. Take for 4 days. 06/04/17   Ladell Pier, MD  HYDROcodone-acetaminophen (NORCO) 5-325 MG tablet Take 1 tablet by mouth every 6 (six) hours as needed for severe pain. 06/04/17   Ladell Pier, MD  OXYGEN Inhale 2 L into the lungs as needed (for shortness of breath).     [provider]  pantoprazole (PROTONIX) 40 MG tablet Take 40 mg daily by mouth. 02/11/17   [provider]  polyethylene glycol (MIRALAX / GLYCOLAX) packet Take 17 g by mouth 2 (two) times daily. Patient not taking: Reported on 04/04/2017 07/26/16   Bonnielee Haff, MD  prochlorperazine (COMPAZINE) 10 MG tablet Take 0.5 tablets (5 mg total) by mouth  every 6 (six) hours as needed for nausea or vomiting. Patient not taking: Reported on 04/20/2017 10/08/16   Ladell Pier, MD  rizatriptan (MAXALT) 10 MG tablet Take 10 mg by mouth as needed.  05/17/17   [provider]  sertraline (ZOLOFT) 25 MG tablet Take 25 mg by mouth at bedtime.  04/05/16   [provider]  temazepam (RESTORIL) 30 MG capsule Take 1 capsule (30 mg total) by mouth at bedtime. 05/05/16   Orson Eva, MD  topiramate (TOPAMAX) 50 MG tablet Take 1 tablet by mouth daily. 05/17/17   [provider]  VENTOLIN HFA 108 (90 Base) MCG/ACT inhaler Inhale 2 puffs into the lungs every 4 (four) hours as needed for wheezing or shortness of breath.  07/05/16   [provider]    Allergies Eliquis [apixaban]; Cardizem cd [diltiazem hcl er beads]; Citrus; and Penicillins  Family History  Problem Relation Age of Onset  . Aneurysm Mother   . Liver disease Father   . Congestive Heart Failure Brother   . Heart disease Brother   . Colon cancer Neg Hx   . Esophageal  cancer Neg Hx   . Pancreatic cancer Neg Hx   . Rectal cancer Neg Hx   . Stomach cancer Neg Hx     Social History Social History   Tobacco Use  . Smoking status: Former Smoker    Packs/day: 0.00    Years: 45.00    Pack years: 0.00  . Smokeless tobacco: Never Used  Substance Use Topics  . Alcohol use: No    Alcohol/week: 0.0 oz  . Drug use: No    Review of Systems  Constitutional: + fever and generalized weakness Eyes: Negative for visual changes. ENT: Negative for sore throat. Neck: No neck pain  Cardiovascular: Negative for chest pain. Respiratory: + shortness of breath and cough Gastrointestinal: Negative for abdominal pain. + vomiting and diarrhea. Genitourinary: Negative for dysuria. Musculoskeletal: Negative for back pain. Skin: Negative for rash. Neurological: Negative for headaches, weakness or numbness. Psych: No SI or  HI  ____________________________________________   PHYSICAL EXAM:  VITAL SIGNS: Vitals:   06/10/17 1218  BP: (!) 130/98  Pulse: (!) 131  Temp: 97.6 F (36.4 C)  SpO2: 90%    Constitutional: Alert and oriented, increased WOB. HEENT:      Head: Normocephalic and atraumatic.         Eyes: Conjunctivae are normal. Sclera is non-icteric.       Mouth/Throat: Mucous membranes are dry.       Neck: Supple with no signs of meningismus. Cardiovascular: irregularly irregular rhythm with tachycardic rate. No murmurs, gallops, or rubs. 2+ symmetrical distal pulses are present in all extremities. No JVD. Respiratory: increased work of breathing, satting 90% on 2 L nasal cannula, diffuse expiratory wheezes Gastrointestinal: Soft, non tender, and non distended with positive bowel sounds. No rebound or guarding. Musculoskeletal: Nontender with normal range of motion in all extremities. No edema, cyanosis, or erythema of extremities. Neurologic: Normal speech and language. Face is symmetric. Moving all extremities. No gross focal neurologic deficits are appreciated. Skin: Skin is warm, dry and intact. No rash noted. Psychiatric: Mood and affect are normal. Speech and behavior are normal.  ____________________________________________   LABS (all labs ordered are listed, but only abnormal results are displayed)  Labs Reviewed  COMPREHENSIVE METABOLIC PANEL - Abnormal; Notable for the following components:      Result Value   Sodium 132 (*)    Potassium 5.7 (*)    Chloride 89 (*)    CO2 33 (*)    Glucose, Bld 172 (*)    BUN 53 (*)    Calcium 8.8 (*)    Total Protein 6.4 (*)    AST 48 (*)    ALT 84 (*)    All other components within normal limits  CBC WITH DIFFERENTIAL/PLATELET - Abnormal; Notable for the following components:   WBC 70.3 (*)    MCHC 30.1 (*)    RDW 19.3 (*)    All other components within normal limits  LACTIC ACID, PLASMA - Abnormal; Notable for the following  components:   Lactic Acid, Venous 2.5 (*)    All other components within normal limits  CULTURE, BLOOD (ROUTINE X 2)  CULTURE, BLOOD (ROUTINE X 2)  LACTIC ACID, PLASMA  URINALYSIS, COMPLETE (UACMP) WITH MICROSCOPIC  INFLUENZA PANEL BY PCR (TYPE A & B)  MAGNESIUM   ____________________________________________  EKG  ED ECG REPORT I, Rudene Re, the attending physician, personally viewed and interpreted this ECG.  Atrial fibrillation, rate of 141, normal QTC, normal axis, no ST elevations or depressions. unchanged from  prior from 2 weeks ago ____________________________________________  RADIOLOGY  I have personally reviewed the images performed during this visit and I agree with the Radiologist's read.   Interpretation by Radiologist:  Dg Chest 2 View  Result Date: 06/10/2017 CLINICAL DATA:  Shortness of breath. History of COPD and lung cancer. History of brain cancer scheduled to start chemotherapy today. EXAM: CHEST  2 VIEW COMPARISON:  Chest x-ray dated 05/24/2017. FINDINGS: Complete opacification of the left chest, presumably large pleural effusion. Associated slight rightward shift of the cardiomediastinal structures. Right lung is clear. No acute or suspicious osseous finding. IMPRESSION: New complete opacification of the left hemithorax, presumably due to large pleural effusion. Electronically Signed   By: Franki Cabot M.D.   On: 06/10/2017 13:36      ____________________________________________   PROCEDURES  Procedure(s) performed: None Procedures Critical Care performed: yes  CRITICAL CARE Performed by: Rudene Re  ?  Total critical care time: 40 min  Critical care time was exclusive of separately billable procedures and treating other patients.  Critical care was necessary to treat or prevent imminent or life-threatening deterioration.  Critical care was time spent personally by me on the following activities: development of treatment plan  with patient and/or surrogate as well as nursing, discussions with consultants, evaluation of patient's response to treatment, examination of patient, obtaining history from patient or surrogate, ordering and performing treatments and interventions, ordering and review of laboratory studies, ordering and review of radiographic studies, pulse oximetry and re-evaluation of patient's condition.  ____________________________________________   INITIAL IMPRESSION / ASSESSMENT AND PLAN / ED COURSE  67 y.o. female with a history of recently diagnosed with metastatic brain lesion, recent PNA 2 weeks ago, paroxysmal afib, COPD who presents for evaluation of generalized weakness, low grade fever, N/V/D, cough. patient in moderate respiratory distress with increased work of breathing, hypoxic on baseline 2 L, diffuse expiratory wheezes. She is in A. fib with RVR. We'll check electrolytes, we'll give IV magnesium and metoprolol since patient is allergic to Cardizem. Will give IV fluids since patient looks dry and exam. We'll check labs, flu swab, chest x-ray to rule out dehydration, infection, electrolyte abnormalities. We'll give DuoNeb 3 and Solu-Medrol for COPD exacerbation. Patient is afebrile at this time.  Clinical Course as of Jun 10 1346  Mon Jun 10, 2017  1338 lactic acid of 2.5, white count of 70K and a chest x-ray showing complete whiteout of the left hemithorax concerning for sepsis from pneumonia versus empyema. Patient was covered broadly with cefepime and vancomycin. She has received metoprolol for A. fib with RVR with improvement of her rate to 110s. Will start PO metoprolol. K elevated with no evidence EKG changes. patient has already received albuterol 3 and 1 L bolus. We'll also give second liter bolus, bicarbonate, D50, and insulin.Patient will be admitted to the Hospitalist.  [CV]    Clinical Course User Index [CV] Alfred Levins [place], MD     As part of my medical decision making, I  reviewed the following data within the [place] notes reviewed and incorporated, Labs reviewed , EKG interpreted , Old EKG reviewed, Old chart reviewed, Radiograph reviewed , Discussed with admitting physician , Notes from prior ED visits and [place] Controlled Substance Database    Pertinent labs & imaging results that were available during my care of the patient were reviewed by me and considered in my medical decision making (see chart for details).    ____________________________________________   FINAL CLINICAL IMPRESSION(S) /  ED DIAGNOSES  Final diagnoses:  COPD exacerbation ([place])  HCAP (healthcare-associated pneumonia)  Sepsis, due to unspecified organism [facility])  Atrial fibrillation with RVR ([place])  Hyperkalemia      NEW MEDICATIONS STARTED DURING THIS VISIT:  ED Discharge Orders    None       Note:  This document was prepared using Dragon voice recognition software and may include unintentional dictation errors.    Alfred Levins, [place], MD 06/10/17 [phone]

## 2017-06-10 NOTE — Progress Notes (Signed)
eLink Physician-Brief Progress Note Patient Name: Sheri Ayala DOB: 07/19/1950 MRN: [id number]   Date of Service  06/10/2017  HPI/Events of Note  67 yo female with complex PMH. Admitted with AFIB w/RVR. Found with agonal respirations. Now intubated and ventilated. PCCM asked to assume care. VSS,   eICU Interventions  No new orders.      Intervention Category Evaluation Type: New Patient Evaluation  Lysle Dingwall 06/10/2017, 11:54 PM

## 2017-06-10 NOTE — Progress Notes (Signed)
Pharmacy Antibiotic Note  Sheri Ayala is a 67 y.o. female admitted on 06/10/2017 with pneumonia.  Pharmacy has been consulted for cefepime + vancomycin dosing.  Patient has PCN allergy listed in chart but received a dose of cefepime in the ED. Spoke with RN who denies that patient has any signs or symptoms of an allergic reaction at this time.  Plan: Cefepime 2 g IV q12h  Vancomycin 1000 mg IV given in ED. Will order vancomycin 750 mg IV q18h Goal VT 15-20 mcg/mL  Kinetics: Weight = 54 kg, CrCl = 59 mL/min Ke: 0.053 Half-life: 13 hrs Vd: 38 L Cmin ~15 mcg/mL  MRSA PCR and procalcitonin ordered.  Height: 5' 9.5" (176.5 cm) Weight: 120 lb (54.4 kg) IBW/kg (Calculated) : 67.35  Temp (24hrs), Avg:97.8 F (36.6 C), Min:97.6 F (36.4 C), Max:97.9 F (36.6 C)  Recent Labs  Lab 06/10/17 1214 06/10/17 1404  WBC 70.3*  --   CREATININE 0.78  --   LATICACIDVEN 2.5* 2.1*    Estimated Creatinine Clearance: 58.6 mL/min (by C-G formula based on SCr of 0.78 mg/dL).    Allergies  Allergen Reactions  . Eliquis [Apixaban] Rash    Hives, and difficulty swallowing  . Cardizem Cd [Diltiazem Hcl Er Beads] Rash  . Citrus Other (See Comments)    Burning in stomach   . Penicillins Hives, Nausea And Vomiting, Swelling and Other (See Comments)    Patient reports tolerating ampicillin.  Has patient had a PCN reaction causing immediate rash, facial/tongue/throat swelling, SOB or lightheadedness with hypotension: Yes Has patient had a PCN reaction causing severe rash involving mucus membranes or skin necrosis: unknown Has patient had a PCN reaction that required hospitalization No Has patient had a PCN reaction occurring within the last 10 years: No If all of the above answers are "NO", then may proceed with Cephalosporin use.     Antimicrobials this admission: Cefepime + vancomycin 2/25 >>   Dose adjustments this admission:  Microbiology results: 2/25 BCx: Sent  2/25 MRSA PCR:  Ordered  Thank you for allowing pharmacy to be a part of this patient's care.  Lenis Noon, PharmD, BCPS Clinical Pharmacist 06/10/2017 5:00 PM

## 2017-06-11 ENCOUNTER — Ambulatory Visit: Payer: Medicare HMO | Admitting: Oncology

## 2017-06-11 ENCOUNTER — Inpatient Hospital Stay: Payer: Medicare HMO

## 2017-06-11 ENCOUNTER — Inpatient Hospital Stay (HOSPITAL_COMMUNITY)
Admit: 2017-06-11 | Discharge: 2017-06-11 | Disposition: A | Discharge status: Home / Self Care | Payer: Medicare HMO | Attending: Internal Medicine | Admitting: Internal Medicine

## 2017-06-11 ENCOUNTER — Ambulatory Visit: Payer: Medicare HMO

## 2017-06-11 DIAGNOSIS — J189 Pneumonia, unspecified organism: Secondary | ICD-10-CM

## 2017-06-11 DIAGNOSIS — Z978 Presence of other specified devices: Secondary | ICD-10-CM

## 2017-06-11 DIAGNOSIS — J9600 Acute respiratory failure, unspecified whether with hypoxia or hypercapnia: Secondary | ICD-10-CM

## 2017-06-11 DIAGNOSIS — Z515 Encounter for palliative care: Secondary | ICD-10-CM

## 2017-06-11 DIAGNOSIS — Z4659 Encounter for fitting and adjustment of other gastrointestinal appliance and device: Secondary | ICD-10-CM

## 2017-06-11 DIAGNOSIS — J441 Chronic obstructive pulmonary disease with (acute) exacerbation: Secondary | ICD-10-CM

## 2017-06-11 DIAGNOSIS — I4891 Unspecified atrial fibrillation: Secondary | ICD-10-CM

## 2017-06-11 DIAGNOSIS — I5032 Chronic diastolic (congestive) heart failure: Secondary | ICD-10-CM

## 2017-06-11 DIAGNOSIS — Z7189 Other specified counseling: Secondary | ICD-10-CM

## 2017-06-11 LAB — BASIC METABOLIC PANEL
Anion gap: 19 — ABNORMAL HIGH (ref 5–15)
BUN: 46 mg/dL — AB (ref 6–20)
CHLORIDE: 98 mmol/L — AB (ref 101–111)
CO2: 27 mmol/L (ref 22–32)
CREATININE: 0.6 mg/dL (ref 0.44–1.00)
Calcium: 8.5 mg/dL — ABNORMAL LOW (ref 8.9–10.3)
Glucose, Bld: 235 mg/dL — ABNORMAL HIGH (ref 65–99)
POTASSIUM: 7 mmol/L — AB (ref 3.5–5.1)
SODIUM: 144 mmol/L (ref 135–145)

## 2017-06-11 LAB — GLUCOSE, CAPILLARY
GLUCOSE-CAPILLARY: 136 mg/dL — AB (ref 65–99)
Glucose-Capillary: 234 mg/dL — ABNORMAL HIGH (ref 65–99)
Glucose-Capillary: 131 mg/dL — ABNORMAL HIGH (ref 65–99)
Glucose-Capillary: 68 mg/dL (ref 65–99)
Glucose-Capillary: 111 mg/dL — ABNORMAL HIGH (ref 65–99)
Glucose-Capillary: 121 mg/dL — ABNORMAL HIGH (ref 65–99)
Glucose-Capillary: 164 mg/dL — ABNORMAL HIGH (ref 65–99)

## 2017-06-11 LAB — BLOOD GAS, ARTERIAL
ACID-BASE EXCESS: 3.2 mmol/L — AB (ref 0.0–2.0)
Acid-Base Excess: 12.9 mmol/L — ABNORMAL HIGH (ref 0.0–2.0)
BICARBONATE: 32.1 mmol/L — AB (ref 20.0–28.0)
Bicarbonate: 38.9 mmol/L — ABNORMAL HIGH (ref 20.0–28.0)
FIO2: 100
FIO2: 70
MECHANICAL RATE: 25
O2 SAT: 99.3 %
O2 Saturation: 99.6 %
PATIENT TEMPERATURE: 37
PEEP/CPAP: 5 cmH2O
PO2 ART: 163 mmHg — AB (ref 83.0–108.0)
PO2 ART: 171 mmHg — AB (ref 83.0–108.0)
Patient temperature: 37
VT: 450 mL
pCO2 arterial: 56 mmHg — ABNORMAL HIGH (ref 32.0–48.0)
pCO2 arterial: 70 mmHg (ref 32.0–48.0)
pH, Arterial: 7.27 — ABNORMAL LOW (ref 7.350–7.450)
pH, Arterial: 7.45 (ref 7.350–7.450)

## 2017-06-11 LAB — CBC
HCT: 38.3 % (ref 35.0–47.0)
Hemoglobin: 12.1 g/dL (ref 12.0–16.0)
MCH: 27.1 pg (ref 26.0–34.0)
MCHC: 31.7 g/dL — ABNORMAL LOW (ref 32.0–36.0)
MCV: 85.5 fL (ref 80.0–100.0)
PLATELETS: 139 10*3/uL — AB (ref 150–440)
RBC: 4.49 MIL/uL (ref 3.80–5.20)
RDW: 19.8 % — ABNORMAL HIGH (ref 11.5–14.5)
WBC: 27.3 10*3/uL — AB (ref 3.6–11.0)

## 2017-06-11 LAB — BODY FLUID CELL COUNT WITH DIFFERENTIAL
Eos, Fluid: 0 %
LYMPHS FL: 61 %
Monocyte-Macrophage-Serous Fluid: 1 %
NEUTROPHIL FLUID: 38 %
Total Nucleated Cell Count, Fluid: 165 cu mm

## 2017-06-11 LAB — URINALYSIS, COMPLETE (UACMP) WITH MICROSCOPIC
BACTERIA UA: NONE SEEN
Bilirubin Urine: NEGATIVE
GLUCOSE, UA: 150 mg/dL — AB
HGB URINE DIPSTICK: NEGATIVE
KETONES UR: NEGATIVE mg/dL
LEUKOCYTES UA: NEGATIVE
NITRITE: NEGATIVE
PROTEIN: 30 mg/dL — AB
Specific Gravity, Urine: 1.042 — ABNORMAL HIGH (ref 1.005–1.030)
pH: 5 (ref 5.0–8.0)

## 2017-06-11 LAB — LACTATE DEHYDROGENASE, PLEURAL OR PERITONEAL FLUID: LD, Fluid: 896 U/L — ABNORMAL HIGH (ref 3–23)

## 2017-06-11 LAB — GRAM STAIN

## 2017-06-11 LAB — PHOSPHORUS: PHOSPHORUS: 2.9 mg/dL (ref 2.5–4.6)

## 2017-06-11 LAB — ECHOCARDIOGRAM COMPLETE
Height: 69.5 in
Weight: 1920 oz

## 2017-06-11 LAB — GLUCOSE, PLEURAL OR PERITONEAL FLUID: Glucose, Fluid: 100 mg/dL

## 2017-06-11 LAB — TROPONIN I

## 2017-06-11 LAB — PROTEIN, PLEURAL OR PERITONEAL FLUID

## 2017-06-11 LAB — MAGNESIUM: Magnesium: 2.1 mg/dL (ref 1.7–2.4)

## 2017-06-11 LAB — PROCALCITONIN: PROCALCITONIN: 0.16 ng/mL

## 2017-06-11 LAB — POTASSIUM: POTASSIUM: 3.9 mmol/L (ref 3.5–5.1)

## 2017-06-11 MED ORDER — FENTANYL 2500MCG IN NS 250ML (10MCG/ML) PREMIX INFUSION
25.0000 ug/h | INTRAVENOUS | Status: DC
  Administered 2017-06-11: 25 ug/h via INTRAVENOUS
  Administered 2017-06-12: 75 ug/h via INTRAVENOUS
  Filled 2017-06-11 (×2): qty 250

## 2017-06-11 MED ORDER — SERTRALINE HCL 50 MG PO TABS
25.0000 mg | ORAL_TABLET | Freq: Every day | ORAL | Status: DC
  Administered 2017-06-11 – 2017-06-13 (×3): 25 mg
  Filled 2017-06-11 (×3): qty 1

## 2017-06-11 MED ORDER — METHYLPREDNISOLONE SODIUM SUCC 40 MG IJ SOLR
40.0000 mg | Freq: Two times a day (BID) | INTRAMUSCULAR | Status: DC
  Administered 2017-06-11 – 2017-06-13 (×6): 40 mg via INTRAVENOUS
  Filled 2017-06-11 (×6): qty 1

## 2017-06-11 MED ORDER — ADULT MULTIVITAMIN LIQUID CH
15.0000 mL | Freq: Every day | ORAL | Status: DC
  Administered 2017-06-11 – 2017-06-13 (×3): 15 mL
  Filled 2017-06-11 (×3): qty 15

## 2017-06-11 MED ORDER — BUDESONIDE 0.5 MG/2ML IN SUSP
0.5000 mg | Freq: Two times a day (BID) | RESPIRATORY_TRACT | Status: DC
  Administered 2017-06-11 – 2017-06-14 (×7): 0.5 mg via RESPIRATORY_TRACT
  Filled 2017-06-11 (×7): qty 2

## 2017-06-11 MED ORDER — CHLORHEXIDINE GLUCONATE 0.12% ORAL RINSE (MEDLINE KIT)
15.0000 mL | Freq: Two times a day (BID) | OROMUCOSAL | Status: DC
  Administered 2017-06-11 – 2017-06-13 (×5): 15 mL via OROMUCOSAL

## 2017-06-11 MED ORDER — LEVALBUTEROL HCL 0.63 MG/3ML IN NEBU: 0.6300 mg | INHALATION_SOLUTION | Freq: Four times a day (QID) | RESPIRATORY_TRACT | Status: DC | PRN

## 2017-06-11 MED ORDER — DOCUSATE SODIUM 50 MG/5ML PO LIQD: 100.0000 mg | Freq: Two times a day (BID) | ORAL | Status: DC | PRN

## 2017-06-11 MED ORDER — LORAZEPAM 2 MG/ML IJ SOLN
1.0000 mg | INTRAMUSCULAR | Status: DC | PRN
  Administered 2017-06-11 – 2017-06-12 (×2): 1 mg via INTRAVENOUS
  Administered 2017-06-12 – 2017-06-13 (×3): 2 mg via INTRAVENOUS
  Filled 2017-06-11 (×5): qty 1

## 2017-06-11 MED ORDER — BUDESONIDE 0.25 MG/2ML IN SUSP
0.2500 mg | Freq: Two times a day (BID) | RESPIRATORY_TRACT | Status: DC
  Administered 2017-06-11: 0.25 mg via RESPIRATORY_TRACT
  Filled 2017-06-11: qty 2

## 2017-06-11 MED ORDER — METOPROLOL TARTRATE 50 MG PO TABS
50.0000 mg | ORAL_TABLET | Freq: Two times a day (BID) | ORAL | Status: DC
  Administered 2017-06-11 – 2017-06-12 (×4): 50 mg
  Filled 2017-06-11 (×4): qty 1

## 2017-06-11 MED ORDER — FENTANYL BOLUS VIA INFUSION
25.0000 ug | INTRAVENOUS | Status: DC | PRN
  Administered 2017-06-11 – 2017-06-13 (×3): 25 ug via INTRAVENOUS
  Filled 2017-06-11: qty 25

## 2017-06-11 MED ORDER — FAMOTIDINE 20 MG PO TABS
20.0000 mg | ORAL_TABLET | Freq: Two times a day (BID) | ORAL | Status: DC
Start: 2017-06-11 — End: 2017-06-14
  Administered 2017-06-11 – 2017-06-13 (×6): 20 mg
  Filled 2017-06-11 (×6): qty 1

## 2017-06-11 MED ORDER — ORAL CARE MOUTH RINSE
15.0000 mL | OROMUCOSAL | Status: DC
  Administered 2017-06-11 – 2017-06-14 (×30): 15 mL via OROMUCOSAL

## 2017-06-11 MED ORDER — LEVALBUTEROL HCL 0.63 MG/3ML IN NEBU
0.6300 mg | INHALATION_SOLUTION | Freq: Four times a day (QID) | RESPIRATORY_TRACT | Status: DC
  Administered 2017-06-11 (×2): 0.63 mg via RESPIRATORY_TRACT
  Filled 2017-06-11 (×2): qty 3

## 2017-06-11 MED ORDER — INSULIN ASPART 100 UNIT/ML ~~LOC~~ SOLN
0.0000 [IU] | SUBCUTANEOUS | Status: DC
  Administered 2017-06-11 (×3): 1 [IU] via SUBCUTANEOUS
  Administered 2017-06-11: 3 [IU] via SUBCUTANEOUS
  Administered 2017-06-11 – 2017-06-12 (×4): 2 [IU] via SUBCUTANEOUS
  Administered 2017-06-13: 1 [IU] via SUBCUTANEOUS
  Administered 2017-06-13 (×2): 2 [IU] via SUBCUTANEOUS
  Administered 2017-06-13 – 2017-06-14 (×2): 1 [IU] via SUBCUTANEOUS
  Filled 2017-06-11 (×12): qty 1

## 2017-06-11 MED ORDER — DEXTROSE 50 % IV SOLN
INTRAVENOUS | Status: AC
  Administered 2017-06-11: 25 mL via INTRAVENOUS
  Filled 2017-06-11: qty 50

## 2017-06-11 MED ORDER — INSULIN ASPART 100 UNIT/ML IV SOLN
10.0000 [IU] | Freq: Once | INTRAVENOUS | Status: AC
  Administered 2017-06-11: 10 [IU] via INTRAVENOUS
  Filled 2017-06-11: qty 0.1

## 2017-06-11 MED ORDER — TOPIRAMATE 25 MG PO TABS
50.0000 mg | ORAL_TABLET | Freq: Every day | ORAL | Status: DC
  Administered 2017-06-11 – 2017-06-13 (×3): 50 mg
  Filled 2017-06-11 (×4): qty 2

## 2017-06-11 MED ORDER — VITAL 1.5 CAL PO LIQD
1000.0000 mL | ORAL | Status: DC
  Administered 2017-06-11 – 2017-06-12 (×2): 1000 mL

## 2017-06-11 MED ORDER — IPRATROPIUM-ALBUTEROL 0.5-2.5 (3) MG/3ML IN SOLN
3.0000 mL | RESPIRATORY_TRACT | Status: DC
  Administered 2017-06-11 – 2017-06-14 (×17): 3 mL via RESPIRATORY_TRACT
  Filled 2017-06-11 (×18): qty 3

## 2017-06-11 MED ORDER — DEXTROSE 50 % IV SOLN
25.0000 mL | Freq: Once | INTRAVENOUS | Status: AC
  Administered 2017-06-11: 25 mL via INTRAVENOUS

## 2017-06-11 MED ORDER — DEXTROSE 50 % IV SOLN
1.0000 | Freq: Once | INTRAVENOUS | Status: AC
  Administered 2017-06-11: 50 mL via INTRAVENOUS
  Filled 2017-06-11: qty 50

## 2017-06-11 MED ORDER — FAMOTIDINE 20 MG IN NS 100 ML IVPB
20.0000 mg | Freq: Two times a day (BID) | INTRAVENOUS | Status: DC
  Administered 2017-06-11: 20 mg via INTRAVENOUS
  Filled 2017-06-11: qty 100

## 2017-06-11 MED ORDER — METOPROLOL TARTRATE 5 MG/5ML IV SOLN
2.5000 mg | Freq: Four times a day (QID) | INTRAVENOUS | Status: DC | PRN
Start: 2017-06-11 — End: 2017-06-14
  Administered 2017-06-11 – 2017-06-13 (×4): 5 mg via INTRAVENOUS
  Filled 2017-06-11 (×4): qty 5

## 2017-06-11 MED ORDER — FENTANYL CITRATE (PF) 100 MCG/2ML IJ SOLN
50.0000 ug | Freq: Once | INTRAMUSCULAR | Status: AC
  Administered 2017-06-11: 50 ug via INTRAVENOUS
  Filled 2017-06-11: qty 2

## 2017-06-11 MED ORDER — VITAL HIGH PROTEIN PO LIQD: 1000.0000 mL | ORAL | Status: DC

## 2017-06-11 MED ORDER — SODIUM BICARBONATE 8.4 % IV SOLN
50.0000 meq | Freq: Once | INTRAVENOUS | Status: AC
  Administered 2017-06-11: 50 meq via INTRAVENOUS
  Filled 2017-06-11: qty 50

## 2017-06-11 MED FILL — Medication: Qty: 1 | Status: AC

## 2017-06-11 NOTE — Procedures (Signed)
PROCEDURE SUMMARY:  Successful US guided left thoracentesis. Yielded 1.2 L of hazy, amber colored fluid. Pt tolerated procedure well. No immediate complications.  Specimen was sent for labs. CXR ordered.  Ascencion Dike PA-C 06/11/2017 11:49 AM

## 2017-06-11 NOTE — Progress Notes (Addendum)
Initial Nutrition Assessment  DOCUMENTATION CODES:   Not applicable  INTERVENTION:   Initiate Vital 1.5 @ 36mL/hr (970mL goal daily volume) via OG tube; provides 1,440 kcal; 65 g PRO; 976mL  Liquid MVI daily per tube    NUTRITION DIAGNOSIS:   Inadequate oral intake related to inability to eat(Pt on vent/tube feed) as evidenced by NPO status(Vent/tube feed).   GOAL:   Provide needs based on ASPEN/SCCM guidelines   MONITOR:   Diet advancement, Vent status, Labs, Weight trends, TF tolerance  REASON FOR ASSESSMENT:   Ventilator    ASSESSMENT:   67 yo female admitted 2/25 with Afib/RVR secondary to hypoxic pneumonia in the setting of COPD. PMH rt pleural effusion, small cell lung carcinoma, on home O2, s/p thoracentesis, brain metastases with hx CVA.  Pt intubated, on O2.  Per discussion in rounds, tube feeding planned to be initiated after x-ray confirmation of gastric tube placement OG tube access  Xray confirmation 2/26 of tube placement in stomach Pt is hemodynamically stable (no pressors, MAP = 95; DiasBP = 63)  Measured Ve 11.4 L Tmax 37 C Pt was alert and oriented during nutrition assessment.  No notable bowel distention noted during NFPE.   Medications:  PO/Injections  famotidine (PEPCID)  insulin SubQ Q4H  prednisone  NaCl 75 mL/hr   Metoprolol  Sertraline  Continuous/IV Cefepime 142mL Q12H Fentanyl 2.5-5 mL/hr   Labs/Vitals:   FIO2 70.00  Peep/cpap 5.0  Arterial pH 7.45  PCO2 56 (H)  PO2 171 (H)  Acid-base 12.9 (H)   Per medical record, pt has experienced significant wt loss in past 3 mo.  Wt Readings from Last 10 Encounters:  06/10/17 120 lb (54.4 kg)  06/04/17 128 lb 8 oz (58.3 kg)  05/24/17 132 lb 4.4 oz (60 kg)  05/08/17 137 lb 12.8 oz (62.5 kg)  04/22/17 136 lb 12.8 oz (62.1 kg)  04/20/17 135 lb (61.2 kg)  04/04/17 136 lb 9.6 oz (62 kg)  02/19/17 131 lb 3.2 oz (59.5 kg)  12/24/16 128 lb 1.6 oz (58.1 kg)  11/21/16  126 lb 12.8 oz (57.5 kg)     NUTRITION - FOCUSED PHYSICAL EXAM:    Most Recent Value  Orbital Region  Mild depletion  Upper Arm Region  Unable to assess  Thoracic and Lumbar Region  Unable to assess  Buccal Region  Unable to assess  Temple Region  Moderate depletion  Clavicle Bone Region  No depletion  Clavicle and Acromion Bone Region  Mild depletion  Scapular Bone Region  Unable to assess  Dorsal Hand  Mild depletion  Patellar Region  No depletion  Anterior Thigh Region  Mild depletion  Posterior Calf Region  No depletion  Edema (RD Assessment)  None  Hair  Reviewed  Eyes  Reviewed  Mouth  Unable to assess  Skin  Reviewed  Nails  Reviewed       Diet Order:  No diet orders on file  EDUCATION NEEDS:   Not appropriate for education at this time  Skin:  Skin Assessment: Reviewed RN Assessment  Last BM:  2/26  Height:   Ht Readings from Last 1 Encounters:  06/10/17 5' 9.5" (1.765 m)    Weight:   Wt Readings from Last 1 Encounters:  06/10/17 120 lb (54.4 kg)    Ideal Body Weight:  67 kg  BMI:  Body mass index is 17.47 kg/m.  Estimated Nutritional Needs:   Kcal:  1,430 kcal (PSU 2003 w/MSJ 1,156; Ve 11.4; Tmax 37)  Protein:  65-82 grams (1.2-1.5 g/kg)  Fluid:  1.6 L (68mL/kg)    Edmonia Lynch, MS Dietetic Intern Pager: [phone]

## 2017-06-11 NOTE — Progress Notes (Signed)
Patient vital signs are stable. Patient is able to nod appropriately to nurse's question. Nurse was able to get in contact with patient's family and give brief update. Nurse advanced OG tube per Hinton Dyer, NP to 62 cm. Tube is clamped. No new abdomen xray done yet. Please don't use until another abd. Xray done on patient. Will pass info to day shift nurse.

## 2017-06-11 NOTE — Progress Notes (Signed)
ET tube advanced  2 cm per Chest x-ray results and is taped at 22 cm lip mark

## 2017-06-11 NOTE — Progress Notes (Signed)
   06/10/17 2345  Clinical Encounter Type  Visited With Patient not available  Visit Type Code  Referral From Nurse  Consult/Referral To Chaplain  Spiritual Encounters  Spiritual Needs Prayer   [place] received a Code Blue PG. CH responded and offered silent prayer.

## 2017-06-11 NOTE — Progress Notes (Signed)
Hematology/Oncology Progress Note [facility] [phone] [phone]  Patient Care Team: Alvester Chou, NP as PCP - General (Nurse Practitioner)   Name of the patient: Sheri Ayala  [id number]  [birth year]  Date of visit: 06/11/17   INTERVAL HISTORY-  Patient was transferred to ICU due to respiratory failure.  Patient is intubated.  She is status post thoracentesis with removal of 1.2 L of hazy, amber colored pleural fluid.  Patient's son is at bedside.  Review of systems- ROS  Cannot obtain as patient is currently intubated.  Allergies  Allergen Reactions  . Eliquis [Apixaban] Rash    Hives, and difficulty swallowing  . Cardizem Cd [Diltiazem Hcl Er Beads] Rash  . Citrus Other (See Comments)    Burning in stomach   . Penicillins Hives, Nausea And Vomiting, Swelling and Other (See Comments)    Patient reports tolerating ampicillin.  Has patient had a PCN reaction causing immediate rash, facial/tongue/throat swelling, SOB or lightheadedness with hypotension: Yes Has patient had a PCN reaction causing severe rash involving mucus membranes or skin necrosis: unknown Has patient had a PCN reaction that required hospitalization No Has patient had a PCN reaction occurring within the last 10 years: No If all of the above answers are "NO", then may proceed with Cephalosporin use.     Patient Active Problem List   Diagnosis Date Noted  . Fall at home, initial encounter 06/10/2017  . Atrial fibrillation with RVR ([place]) 06/10/2017  . Pneumonia 06/10/2017  . Acute on chronic respiratory failure ([place]) 05/24/2017  . Brain metastases ([place]) 05/24/2017  . PICC (peripherally inserted central catheter) flush 09/05/2016  . Port catheter in place 08/15/2016  . Small cell lung cancer in adult ([place]) 07/25/2016  . Lobar pneumonia ([place]) 07/20/2016  . Hilar adenopathy 07/20/2016  . Community acquired pneumonia 07/16/2016  . Leukocytosis 07/16/2016  .  Obstructive atelectasis 07/15/2016  . Pleural effusion   . S/P thoracentesis   . Pleural effusion on right   . History of CVA (cerebrovascular accident) 05/01/2016  . HCAP (healthcare-associated pneumonia) 04/30/2016  . Chronic a-fib ([place]) 04/30/2016  . Severe protein-calorie malnutrition ([place])   . Mass of left lung 04/10/2016  . Acute on chronic respiratory failure with hypoxia ([place]) 01/30/2015  . Right lower lobe pneumonia ([place]) 01/30/2015  . SOB (shortness of breath) 12/24/2012  . COPD with acute exacerbation ([place]) 12/22/2012  . Headache in front of head 03/27/2011  . Thrombocytopenia ([place]) 03/27/2011  . CLL (chronic lymphocytic leukemia) ([place]) 03/09/2011  . Cigarette smoker 03/09/2011     Past Medical History:  Diagnosis Date  . Allergy   . 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 Anxiety   . Asthma   . Atrial fibrillation ([place]) 04/2016  . Brain metastases ([place]) 2019  . Bronchospasm   . Cerebrovascular accident (CVA) ([place]) 08/28/2015  . Chronic lymphocytic leukemia ([place]) 2004  . COPD (chronic obstructive pulmonary disease) ([place])   . Depression   . Emphysema of lung ([place])   . Frontal sinusitis November 2012  . H/O viral encephalitis   . Headache(784.0)   . Heart murmur   . Mitral prolapse 11/24/2015  . Mitral regurgitation 11/24/2015  . Olecranon bursitis of left elbow November 2012.   in past  . Pleurisy   . Pneumonia    13 times  . Rhinitis   . Small cell lung cancer ([place])   . Thrombocytopenia ([place])   . Tobacco abuse   .  Tricuspid valve regurgitation 11/24/2015     Past Surgical History:  Procedure Laterality Date  . ABDOMINAL HYSTERECTOMY    . COLONOSCOPY    . UPPER GASTROINTESTINAL ENDOSCOPY    . VIDEO BRONCHOSCOPY Bilateral 04/18/2016   Procedure: VIDEO BRONCHOSCOPY WITHOUT FLUORO;  Surgeon: Tanda Rockers, MD;  Location: [facility] ENDOSCOPY;  Service: Cardiopulmonary;  Laterality: Bilateral;  . VIDEO BRONCHOSCOPY WITH ENDOBRONCHIAL  ULTRASOUND N/A 07/23/2016   Procedure: VIDEO BRONCHOSCOPY WITH ENDOBRONCHIAL ULTRASOUND;  Surgeon: Grace Isaac, MD;  Location: [facility] OR;  Service: Thoracic;  Laterality: N/A;    Social History   Socioeconomic History  . Marital status: Single    Spouse name: Not on file  . Number of children: Not on file  . Years of education: Not on file  . Highest education level: Not on file  Social Needs  . Financial resource strain: Not on file  . Food insecurity - worry: Not on file  . Food insecurity - inability: Not on file  . Transportation needs - medical: Not on file  . Transportation needs - non-medical: Not on file  Occupational History  . Not on file  Tobacco Use  . Smoking status: Former Smoker    Packs/day: 0.00    Years: 45.00    Pack years: 0.00  . Smokeless tobacco: Never Used  Substance and Sexual Activity  . Alcohol use: No    Alcohol/week: 0.0 oz  . Drug use: No  . Sexual activity: Not Currently  Other Topics Concern  . Not on file  Social History Narrative  . Not on file     Family History  Problem Relation Age of Onset  . Aneurysm Mother   . Liver disease Father   . Congestive Heart Failure Brother   . Heart disease Brother   . Colon cancer Neg Hx   . Esophageal cancer Neg Hx   . Pancreatic cancer Neg Hx   . Rectal cancer Neg Hx   . Stomach cancer Neg Hx      Current Facility-Administered Medications:  .  budesonide (PULMICORT) nebulizer solution 0.5 mg, 0.5 mg, Nebulization, BID, Kasa, Kurian, MD, 0.5 mg at 06/11/17 1148 .  ceFEPIme (MAXIPIME) 2 g in sodium chloride 0.9 % 100 mL IVPB, 2 g, Intravenous, Q12H, Lenis Noon, RPH, Stopped at 06/11/17 1043 .  chlorhexidine gluconate (MEDLINE KIT) (PERIDEX) 0.12 % solution 15 mL, 15 mL, Mouth Rinse, BID, Blakeney, Dana G, NP, 15 mL at 06/11/17 0744 .  docusate (COLACE) 50 MG/5ML liquid 100 mg, 100 mg, Per Tube, BID PRN, Awilda Bill, NP .  famotidine (PEPCID) tablet 20 mg, 20 mg, Per Tube, BID, Flora Lipps, MD, 20 mg at 06/11/17 1013 .  feeding supplement (VITAL 1.5 CAL) liquid 1,000 mL, 1,000 mL, Per Tube, Q24H, Kasa, Kurian, MD, Last Rate: 40 mL/hr at 06/11/17 1501, 1,000 mL at 06/11/17 1501 .  fentaNYL (SUBLIMAZE) bolus via infusion 25 mcg, 25 mcg, Intravenous, Q1H PRN, Awilda Bill, NP, 25 mcg at 06/11/17 0800 .  fentaNYL 2582mg in NS 2527m(1034mml) infusion-PREMIX, 25-400 mcg/hr, Intravenous, Continuous, Blakeney, Dana G, NP, Last Rate: 5 mL/hr at 06/11/17 0600, 50 mcg/hr at 06/11/17 0600 .  heparin injection 5,000 Units, 5,000 Units, Subcutaneous, Q8H, VacVaughan BastaD, 5,000 Units at 06/11/17 1350 .  insulin aspart (novoLOG) injection 0-9 Units, 0-9 Units, Subcutaneous, Q4H, BlaAwilda BillP, 1 Units at 06/11/17 0345 .  ipratropium-albuterol (DUONEB) 0.5-2.5 (3) MG/3ML nebulizer solution 3 mL, 3 mL, Nebulization, Q4H,  Flora Lipps, MD, 3 mL at 06/11/17 1521 .  levalbuterol (XOPENEX) nebulizer solution 0.63 mg, 0.63 mg, Nebulization, Q6H PRN, Awilda Bill, NP .  LORazepam (ATIVAN) injection 1-2 mg, 1-2 mg, Intravenous, Q2H PRN, Flora Lipps, MD, 1 mg at 06/11/17 1037 .  MEDLINE mouth rinse, 15 mL, Mouth Rinse, 10 times per day, Awilda Bill, NP, 15 mL at 06/11/17 1351 .  methylPREDNISolone sodium succinate (SOLU-MEDROL) 40 mg/mL injection 40 mg, 40 mg, Intravenous, Q12H, Blakeney, Dana G, NP, 40 mg at 06/11/17 1252 .  metoprolol tartrate (LOPRESSOR) injection 2.5-5 mg, 2.5-5 mg, Intravenous, Q6H PRN, Awilda Bill, NP, 5 mg at 06/11/17 1014 .  metoprolol tartrate (LOPRESSOR) tablet 50 mg, 50 mg, Per Tube, BID, Kasa, Kurian, MD, 50 mg at 06/11/17 1013 .  multivitamin liquid 15 mL, 15 mL, Per Tube, Daily, Kasa, Kurian, MD, 15 mL at 06/11/17 1501 .  sertraline (ZOLOFT) tablet 25 mg, 25 mg, Per Tube, QHS, Kasa, Kurian, MD .  topiramate (TOPAMAX) tablet 50 mg, 50 mg, Per Tube, Daily, Awilda Bill, NP, 50 mg at 06/11/17 1013   Physical exam:  Vitals:    06/11/17 1300 06/11/17 1400 06/11/17 1500 06/11/17 1539  BP: 114/67 114/74 106/64   Pulse: (!) 108 (!) 111 97   Resp: (!) 25 (!) 26 15   Temp:      TempSrc:      SpO2: 91% 92% 92% 91%  Weight:      Height:       GENERAL: Arousable, intubated EYES: no pallor or icterus NECK: supple, no masses felt LUNGS: decreased breath sound on left.  Intubated HEART/CVS: no murmurs; No lower extremity edema ABDOMEN: abdomen soft, non-tender and normal bowel sounds Musculoskeletal:no cyanosis of digits and no clubbing  NEURO: no focal motor/sensory deficits SKIN:  no rashes or significant lesions       CMP Latest Ref Rng & Units 06/11/2017  Glucose 65 - 99 mg/dL -  BUN 6 - 20 mg/dL -  Creatinine 0.44 - 1.00 mg/dL -  Sodium 135 - 145 mmol/L -  Potassium 3.5 - 5.1 mmol/L 3.9  Chloride 101 - 111 mmol/L -  CO2 22 - 32 mmol/L -  Calcium 8.9 - 10.3 mg/dL -  Total Protein 6.5 - 8.1 g/dL -  Total Bilirubin 0.3 - 1.2 mg/dL -  Alkaline Phos 38 - 126 U/L -  AST 15 - 41 U/L -  ALT 14 - 54 U/L -   CBC Latest Ref Rng & Units 06/11/2017  WBC 3.6 - 11.0 K/uL 27.3(H)  Hemoglobin 12.0 - 16.0 g/dL 12.1  Hematocrit 35.0 - 47.0 % 38.3  Platelets 150 - 440 K/uL 139(L)    '@IMAGES'$ @  Dg Chest 2 View  Result Date: 06/10/2017 CLINICAL DATA:  Shortness of breath. History of COPD and lung cancer. History of brain cancer scheduled to start chemotherapy today. EXAM: CHEST  2 VIEW COMPARISON:  Chest x-ray dated 05/24/2017. FINDINGS: Complete opacification of the left chest, presumably large pleural effusion. Associated slight rightward shift of the cardiomediastinal structures. Right lung is clear. No acute or suspicious osseous finding. IMPRESSION: New complete opacification of the left hemithorax, presumably due to large pleural effusion. Electronically Signed   By: Franki Cabot M.D.   On: 06/10/2017 13:36   Dg Chest 2 View  Result Date: 05/24/2017 CLINICAL DATA:  Shortness of Breath EXAM: CHEST  2 VIEW  COMPARISON:  04/20/2017 FINDINGS: Cardiac shadow is stable. The right lung is clear. Diffuse left lower  lobe infiltrate is noted consistent with pneumonia. No sizable effusion is seen. No bony abnormality is noted. IMPRESSION: Diffuse left lower lobe infiltrate. Electronically Signed   By: Inez Catalina M.D.   On: 05/24/2017 07:20   Dg Abd 1 View  Result Date: 06/11/2017 CLINICAL DATA:  OG tube placement EXAM: ABDOMEN - 1 VIEW COMPARISON:  07/21/2016 FINDINGS: OG tube tip is in the distal esophagus near the GE junction and could be advanced several cm. IMPRESSION: OG tube tip in the distal esophagus near the GE junction. Electronically Signed   By: Rolm Baptise M.D.   On: 06/11/2017 00:35   Ct Head W Or Wo Contrast  Result Date: 05/24/2017 CLINICAL DATA:  67 year old female with 1 week of severe headache. No known injury. 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 EXAM: CT HEAD WITHOUT AND WITH CONTRAST TECHNIQUE: Contiguous axial images were obtained from the base of the skull through the vertex without and with intravenous contrast CONTRAST:  53m ISOVUE-300 IOPAMIDOL (ISOVUE-300) INJECTION 61% COMPARISON:  05/02/2017 head CT, brain MRI 07/24/2016 and earlier. FINDINGS: Brain: Stable cerebral volume. No midline shift, ventriculomegaly, mass effect, intracranial hemorrhage or evidence of cortically based acute infarction. No abnormal enhancement identified. Asymmetric hypodensity in the left cerebellum is new since the head CT 09/29/2015, and not correlated on the 2018 brain MRI. This is increased in conspicuity since January. There is no associated mass effect or enhancement. The caudal aspect of this area has a curvilinear morphology raising the possibility of encephalomalacia (series 2, image 5). Elsewhere stable gray-white matter differentiation throughout the brain, with chronic nonspecific cerebral white matter hypodensity. Vascular: Mild Calcified atherosclerosis at the skull base. Major  intracranial vascular structures are enhancing. Skull: Bone mineralization remains stable. No acute or suspicious osseous lesion identified. Sinuses/Orbits: Visualized paranasal sinuses and mastoids are stable and well pneumatized. Other: Visualized orbits and scalp soft tissues are within normal limits. IMPRESSION: 1. Nonspecific hypodense area in the left cerebellum has developed since 2018. No associated enhancement or mass effect to strongly suggest metastatic disease. This might be a late subacute cerebellar infarct. Recommend Brain MRI without and with contrast to further characterize. 2. No other acute finding. Electronically Signed   By: HGenevie AnnM.D.   On: 05/24/2017 08:23   Ct Chest W Contrast  Result Date: 06/10/2017 CLINICAL DATA:  Metastatic brain lesion. Recent pneumonia. History of COPD and small cell lung cancer. Progressive weakness, cough and shortness of breath. EXAM: CT CHEST WITH CONTRAST TECHNIQUE: Multidetector CT imaging of the chest was performed during intravenous contrast administration. CONTRAST:  769mISOVUE-300 IOPAMIDOL (ISOVUE-300) INJECTION 61% COMPARISON:  Radiographs 06/10/2017 and 05/24/2017.  CT 02/19/2017. FINDINGS: Cardiovascular: Mild atherosclerosis of the aorta, great vessels and coronary arteries. There is extrinsic mass effect on the SVC, right internal jugular vein and pulmonary arteries by extensive mediastinal lymphadenopathy, but no evidence of large vessel occlusion or acute pulmonary embolism. The heart size is normal. There is no pericardial effusion. Mediastinum/Nodes: Patient has developed extensive mediastinal and supraclavicular adenopathy. Representative nodes include a 19 mm right supraclavicular node (image 8/series 2), a 17 mm left supraclavicular node (image 12/2), a 4.3 x 3.5 cm right paratracheal node (image 54) and a 3.2 x 8.2 cm subcarinal node (image 73). There is no axillary adenopathy. The thyroid gland, trachea and esophagus demonstrate no  significant findings. Lungs/Pleura: Interval development of a large complex left pleural effusion with multiple loculations and pleural based nodules. As shown on recent radiographs, the left lung and  central bronchi are completely collapsed. The right lung remains well aerated. There are right lung emphysematous changes and mild right apical scarring. No suspicious right lung nodules. Upper abdomen: Stable low-density right adrenal nodule consistent with a benign adenoma based on stability. Developing right retrocrural and left celiac adenopathy. Musculoskeletal/Chest wall: There is no chest wall mass or suspicious osseous finding. There is paraspinal tumor in the left pleural space without definite neural foraminal or intraspinal extension. IMPRESSION: 1. Extensive mediastinal, supraclavicular and upper abdominal adenopathy consistent with recurrent small cell lung cancer. 2. There is extensive tumor throughout the left pleural space with a loculated large left pleural effusion and complete collapse of the left lung. 3. No new osseous findings.  Left pleural paraspinal tumor. Electronically Signed   By: Richardean Sale M.D.   On: 06/10/2017 15:17   Mr Jeri Cos BT Contrast  Result Date: 05/24/2017 CLINICAL DATA:  Acute headache.  History of lung cancer. EXAM: MRI HEAD WITHOUT AND WITH CONTRAST TECHNIQUE: Multiplanar, multiecho pulse sequences of the brain and surrounding structures were obtained without and with intravenous contrast. CONTRAST:  61m MULTIHANCE GADOBENATE DIMEGLUMINE 529 MG/ML IV SOLN COMPARISON:  Head CT from earlier today.  Brain MRI 07/24/2016 FINDINGS: Brain: The left inferior cerebellar abnormality reflects an enhancing superficial mass measuring up to 17 mm. The mass splays folia but is still likely intra-axial. No second lesion is seen. No infarct, hemorrhage, hydrocephalus, or collection. Chronic white matter disease, usually microvascular ischemic. Vascular: 3 mm enhancing outpouching  extending inferiorly from the right MCA bifurcation. Skull and upper cervical spine: No noted metastatic lesion. Sinuses/Orbits: No visible marrow lesion. Other: These results were called by telephone at the time of interpretation on 05/24/2017 at 10:46 am to Dr. VVaughan Basta, who verbally acknowledged these results. IMPRESSION: 1. 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 2. 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 At follow-up brain MRI a MRA is recommended. Electronically Signed   By: JMonte FantasiaM.D.   On: 05/24/2017 11:11   Dg Chest Port 1 View  Result Date: 06/11/2017 CLINICAL DATA:  Status post thoracentesis EXAM: PORTABLE CHEST 1 VIEW COMPARISON:  Study obtained earlier in the day FINDINGS: Endotracheal tube tip is 5.1 cm above the carina. Nasogastric tube tip and side port are below the diaphragm. No pneumothorax. There remains diffuse opacification of the left hemithorax. Right lung is clear. The heart size appears within normal limits. Pulmonary vascularity on the right appears normal. Pulmonary vascularity on the left is obscured. No adenopathy appreciable in visualized regions. There is midthoracic dextroscoliosis. IMPRESSION: Tube positions as described without pneumothorax. Right lung clear. Diffuse opacification of the left hemithorax remains. Question a degree of residual effusion versus consolidation. There may also be a degree of mucous plugging or obstructing lesion in the left main bronchus. This combination of findings may warrant chest CT, ideally with intravenous contrast, for further delineation. Electronically Signed   By: WLowella GripIII M.D.   On: 06/11/2017 12:16   Dg Chest Port 1 View  Result Date: 06/11/2017 CLINICAL DATA:  67year old female with respiratory failure. EXAM: PORTABLE CHEST 1 VIEW COMPARISON:  Chest CT dated 06/10/2017 FINDINGS: There has been interval placement of an endotracheal tube with tip approximately  4.3 cm above the carina. An enteric tube extends into the left upper abdomen with tip in the proximal stomach. There is complete opacification of the left hemithorax. An area of hazy density in the right upper lung field adjacent to  the overlying support tube noted. There is no pneumothorax. The cardiac borders are silhouetted. No acute osseous pathology. IMPRESSION: 1. Endotracheal tube above the carina and enteric tube in the proximal stomach. 2. Complete opacification of the left hemithorax. Electronically Signed   By: Anner Crete M.D.   On: 06/11/2017 00:38   Dg Abd Portable 1v  Result Date: 06/11/2017 CLINICAL DATA:  Orogastric tube placement. EXAM: PORTABLE ABDOMEN - 1 VIEW COMPARISON:  Study obtained earlier in the day FINDINGS: Orogastric tube tip and side port in the distal stomach. Visualized bowel gas pattern is unremarkable. No obstruction or free air. Diffuse consolidation left lung noted. IMPRESSION: Orogastric tube tip and side port in stomach. Visualized bowel gas pattern unremarkable. Electronically Signed   By: Lowella Grip III M.D.   On: 06/11/2017 12:17   US Thoracentesis Asp Pleural Space W/img Guide  Result Date: 06/11/2017 INDICATION: Metastatic lung cancer. Respiratory failure. Left-sided pleural effusion. Request for diagnostic and therapeutic thoracentesis. EXAM: ULTRASOUND GUIDED LEFT THORACENTESIS MEDICATIONS: N None one. COMPLICATIONS: None immediate. Postprocedural chest x-ray negative for pneumothorax. PROCEDURE: An ultrasound guided thoracentesis was thoroughly discussed with the patient and questions answered. The benefits, risks, alternatives and complications were also discussed. The patient understands and wishes to proceed with the procedure. Written consent was obtained. Ultrasound was performed to localize and mark an adequate pocket of fluid in the left chest. The area was then prepped and draped in the normal sterile fashion. 1% Lidocaine was used for local  anesthesia. Under ultrasound guidance a Safe-T-Centesis catheter was introduced. Thoracentesis was performed. The catheter was removed and a dressing applied. FINDINGS: A total of approximately 1.2 L of hazy, amber colored fluid was removed. Samples were sent to the laboratory as requested by the clinical team. IMPRESSION: Successful ultrasound guided left thoracentesis yielding 1.2 L of pleural fluid. Electronically Signed   By: Markus Daft M.D.   On: 06/11/2017 12:33   Assessment and plan- Patient is a 67 y.o. female with history small cell lung cancer, previously treated and recent newly found brain metastasis, currently admitted due to weakness s/p fall. CXR revealed complete opacification of left thorax.   # Acute on chronic respiratory failure:  CT chest results was discussed with patient and his son.  Unfortunately, on image she has recurrent small cell lung cancer with extensive mediastinal and supraclavicular adenopathy, large complex left pleural effusion with multiple loculations and pleural nodules.  She is status post palliative thoracentesis.  I recommend pigtail catheter being placed given the high likelihood of fast reaccumulation of fluid. # Recurrent small cell lung cancer: poor prognosis, last chemotherapy was on 11/21/2017, recurrent disease around 6 months. Hard to evaluate performance status in acute respiratory setting. Discussed with patient and his son that patient's prognosis is poor.  If patient's respiratory status improves and condition is stabilized, possible options include re-challenging chemotherapy, or second line treatment with immunotherapy or topotecan.  If patient's condition continues to deteriorate, palliative care/hospice is recommended. Once his respiratory status is better, will discuss again with her about goals of care.  # CLL with extreme leukocytosis: likely secondary recent steroid use and reactive to tumor recurrence. Continue supportive care.  Will  continue follow her inpatient course.  Thank you for involving me in this patient's care.   Earlie Server, MD, PhD Hematology Oncology [facility] at [facility] Pager- [phone] 06/11/2017

## 2017-06-11 NOTE — Progress Notes (Addendum)
POST ABG  RATE 25 , TOLERATING WELL. Per dana ,blankeney

## 2017-06-11 NOTE — Consult Note (Signed)
[facility] Cardiology Consultation Note  Patient ID: Sheri Ayala, MRN: [id number], DOB/AGE: 1950-08-21 67 y.o. Admit date: 06/10/2017   Date of Consult: 06/11/2017 Primary Physician: Alvester Chou, NP Primary Cardiologist: None  Chief Complaint:  Chief Complaint  Patient presents with  . Shortness of Breath   Reason for Consult: Atrial fibrillation with rapid ventricular rate  HPI: 67 y.o. female with known apparent severe chronic obstructive pulmonary disease with recent exam and on home oxygen who has had an acute community-acquired pneumonia with hypoxia.  This has caused significant cardiovascular effect for which the patient has had atrial fibrillation with rapid ventricular rate.  The paper the patient was placed on metoprolol for heart rate control which did not give enough heart rate control and then started on amiodarone.  At that time the patient had a significant left lung collapse and respiratory distress for which she did not have any hemodynamic collapse.  The patient then was intubated and now is and oxygenation.  There is a possible side effect to amiodarone and/or from recent community-acquired pneumonia causing above.  Currently she is not had any appear heart failure or myocardial infarction and has improvements of oxygenation.  Heart rate is better controlled at this time with better oxygenation and metoprolol  Past Medical History:  Diagnosis Date  . Allergy   . 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 Anxiety   . Asthma   . Atrial fibrillation ([place]) 04/2016  . Brain metastases ([place]) 2019  . Bronchospasm   . Cerebrovascular accident (CVA) ([place]) 08/28/2015  . Chronic lymphocytic leukemia ([place]) 2004  . COPD (chronic obstructive pulmonary disease) ([place])   . Depression   . Emphysema of lung ([place])   . Frontal sinusitis November 2012  . H/O viral encephalitis   . Headache(784.0)   . Heart murmur   . Mitral prolapse 11/24/2015  . Mitral  regurgitation 11/24/2015  . Olecranon bursitis of left elbow November 2012.   in past  . Pleurisy   . Pneumonia    13 times  . Rhinitis   . Small cell lung cancer ([place])   . Thrombocytopenia ([place])   . Tobacco abuse   . Tricuspid valve regurgitation 11/24/2015      Surgical History:  Past Surgical History:  Procedure Laterality Date  . ABDOMINAL HYSTERECTOMY    . COLONOSCOPY    . UPPER GASTROINTESTINAL ENDOSCOPY    . VIDEO BRONCHOSCOPY Bilateral 04/18/2016   Procedure: VIDEO BRONCHOSCOPY WITHOUT FLUORO;  Surgeon: Tanda Rockers, MD;  Location: [facility];  Service: Cardiopulmonary;  Laterality: Bilateral;  . VIDEO BRONCHOSCOPY WITH ENDOBRONCHIAL ULTRASOUND N/A 07/23/2016   Procedure: VIDEO BRONCHOSCOPY WITH ENDOBRONCHIAL ULTRASOUND;  Surgeon: Grace Isaac, MD;  Location: [place];  Service: Thoracic;  Laterality: N/A;     Home Meds: Prior to Admission medications   Medication Sig Start Date End Date Taking? Authorizing Provider  albuterol (PROVENTIL) (2.5 MG/3ML) 0.083% nebulizer solution Take 3 mLs (2.5 mg total) by nebulization every 4 (four) hours as needed for wheezing or shortness of breath. 06/21/13  Yes Francine Graven, DO  ALPRAZolam Duanne Moron) 0.5 MG tablet Take 1 tablet (0.5 mg total) by mouth 2 (two) times daily as needed for anxiety or sleep. 04/22/17  Yes Ladell Pier, MD  budesonide-formoterol [facility]) 160-4.5 MCG/ACT inhaler Take 2 puffs first thing in am and then another 2 puffs about 12 hours later. 04/29/16  Yes Tanda Rockers, MD  clotrimazole (MYCELEX) 10 MG  troche Take 1 tablet (10 mg total) by mouth 4 (four) times daily. Take while on decadron. 06/04/17  Yes Ladell Pier, MD  dexamethasone (DECADRON) 4 MG tablet Take 1 tablet (4 mg total) by mouth every 12 (twelve) hours. 05/27/17 06/26/17 Yes Gladstone Lighter, MD  fluconazole (DIFLUCAN) 100 MG tablet Take 1 tablet (100 mg total) by mouth daily. Take for 4 days. 06/04/17  Yes Ladell Pier, MD   HYDROcodone-acetaminophen (NORCO) 5-325 MG tablet Take 1 tablet by mouth every 6 (six) hours as needed for severe pain. 06/04/17  Yes Ladell Pier, MD  OXYGEN Inhale 2 L into the lungs as needed (for shortness of breath).    Yes [provider]  pantoprazole (PROTONIX) 40 MG tablet Take 40 mg daily by mouth. 02/11/17  Yes [provider]  rizatriptan (MAXALT) 10 MG tablet Take 10 mg by mouth as needed.  05/17/17  Yes [provider]  sertraline (ZOLOFT) 25 MG tablet Take 25 mg by mouth at bedtime.  04/05/16  Yes [provider]  temazepam (RESTORIL) 30 MG capsule Take 1 capsule (30 mg total) by mouth at bedtime. 05/05/16  Yes Tat, Shanon Brow, MD  topiramate (TOPAMAX) 50 MG tablet Take 1 tablet by mouth daily. 05/17/17  Yes [provider]  VENTOLIN HFA 108 (90 Base) MCG/ACT inhaler Inhale 2 puffs into the lungs every 4 (four) hours as needed for wheezing or shortness of breath.  07/05/16  Yes [provider]  feeding supplement, ENSURE ENLIVE, (ENSURE ENLIVE) LIQD Take 237 mLs by mouth 2 (two) times daily between meals. 05/06/16   Orson Eva, MD  polyethylene glycol (MIRALAX / Floria Raveling) packet Take 17 g by mouth 2 (two) times daily. Patient not taking: Reported on 04/04/2017 07/26/16   Bonnielee Haff, MD  prochlorperazine (COMPAZINE) 10 MG tablet Take 0.5 tablets (5 mg total) by mouth every 6 (six) hours as needed for nausea or vomiting. Patient not taking: Reported on 04/20/2017 10/08/16   Ladell Pier, MD    Inpatient Medications:  . budesonide (PULMICORT) nebulizer solution  0.25 mg Nebulization BID  . chlorhexidine gluconate (MEDLINE KIT)  15 mL Mouth Rinse BID  . famotidine (PEPCID) IV  20 mg Intravenous Q12H  . heparin  5,000 Units Subcutaneous Q8H  . insulin aspart  0-9 Units Subcutaneous Q4H  . levalbuterol  0.63 mg Nebulization Q6H  . mouth rinse  15 mL Mouth Rinse 10 times per day  . methylPREDNISolone (SOLU-MEDROL) injection  40 mg  Intravenous Q12H  . metoprolol succinate  50 mg Oral BID  . sertraline  25 mg Oral QHS  . topiramate  50 mg Per Tube Daily   . sodium chloride 75 mL/hr at 06/11/17 0600  . ceFEPime (MAXIPIME) IV Stopped (06/10/17 2235)  . fentaNYL infusion INTRAVENOUS 50 mcg/hr (06/11/17 0600)  . vancomycin Stopped (06/11/17 0126)    Allergies:  Allergies  Allergen Reactions  . Eliquis [Apixaban] Rash    Hives, and difficulty swallowing  . Cardizem Cd [Diltiazem Hcl Er Beads] Rash  . Citrus Other (See Comments)    Burning in stomach   . Penicillins Hives, Nausea And Vomiting, Swelling and Other (See Comments)    Patient reports tolerating ampicillin.  Has patient had a PCN reaction causing immediate rash, facial/tongue/throat swelling, SOB or lightheadedness with hypotension: Yes Has patient had a PCN reaction causing severe rash involving mucus membranes or skin necrosis: unknown Has patient had a PCN reaction that required hospitalization No Has patient had a  PCN reaction occurring within the last 10 years: No If all of the above answers are "NO", then may proceed with Cephalosporin use.     Social History   Socioeconomic History  . Marital status: Single    Spouse name: Not on file  . Number of children: Not on file  . Years of education: Not on file  . Highest education level: Not on file  Social Needs  . Financial resource strain: Not on file  . Food insecurity - worry: Not on file  . Food insecurity - inability: Not on file  . Transportation needs - medical: Not on file  . Transportation needs - non-medical: Not on file  Occupational History  . Not on file  Tobacco Use  . Smoking status: Former Smoker    Packs/day: 0.00    Years: 45.00    Pack years: 0.00  . Smokeless tobacco: Never Used  Substance and Sexual Activity  . Alcohol use: No    Alcohol/week: 0.0 oz  . Drug use: No  . Sexual activity: Not Currently  Other Topics Concern  . Not on file  Social History  Narrative  . Not on file     Family History  Problem Relation Age of Onset  . Aneurysm Mother   . Liver disease Father   . Congestive Heart Failure Brother   . Heart disease Brother   . Colon cancer Neg Hx   . Esophageal cancer Neg Hx   . Pancreatic cancer Neg Hx   . Rectal cancer Neg Hx   . Stomach cancer Neg Hx      Review of Systems  cannot assess due to sedation  Labs: Recent Labs    06/10/17 1445 06/10/17 1903  TROPONINI <0.03 <0.03   Lab Results  Component Value Date   WBC 27.3 (H) 06/11/2017   HGB 12.1 06/11/2017   HCT 38.3 06/11/2017   MCV 85.5 06/11/2017   PLT 139 (L) 06/11/2017    Recent Labs  Lab 06/10/17 1214 06/10/17 2309  NA 132* 144  K 5.7* 7.0*  CL 89* 98*  CO2 33* 27  BUN 53* 46*  CREATININE 0.78 0.60  CALCIUM 8.8* 8.5*  PROT 6.4*  --   BILITOT 0.8  --   ALKPHOS 85  --   ALT 84*  --   AST 48*  --   GLUCOSE 172* 235*   Lab Results  Component Value Date   CHOL 107 05/03/2016   Lab Results  Component Value Date   DDIMER 0.75 (H) 07/15/2016    Radiology/Studies:  Dg Chest 2 View  Result Date: 06/10/2017 CLINICAL DATA:  Shortness of breath. History of COPD and lung cancer. History of brain cancer scheduled to start chemotherapy today. EXAM: CHEST  2 VIEW COMPARISON:  Chest x-ray dated 05/24/2017. FINDINGS: Complete opacification of the left chest, presumably large pleural effusion. Associated slight rightward shift of the cardiomediastinal structures. Right lung is clear. No acute or suspicious osseous finding. IMPRESSION: New complete opacification of the left hemithorax, presumably due to large pleural effusion. Electronically Signed   By: Franki Cabot M.D.   On: 06/10/2017 13:36   Dg Chest 2 View  Result Date: 05/24/2017 CLINICAL DATA:  Shortness of Breath EXAM: CHEST  2 VIEW COMPARISON:  04/20/2017 FINDINGS: Cardiac shadow is stable. The right lung is clear. Diffuse left lower lobe infiltrate is noted consistent with pneumonia. No  sizable effusion is seen. No bony abnormality is noted. IMPRESSION: Diffuse left lower lobe infiltrate. Electronically  Signed   By: Inez Catalina M.D.   On: 05/24/2017 07:20   Dg Abd 1 View  Result Date: 06/11/2017 CLINICAL DATA:  OG tube placement EXAM: ABDOMEN - 1 VIEW COMPARISON:  07/21/2016 FINDINGS: OG tube tip is in the distal esophagus near the GE junction and could be advanced several cm. IMPRESSION: OG tube tip in the distal esophagus near the GE junction. Electronically Signed   By: Rolm Baptise M.D.   On: 06/11/2017 00:35   Ct Head W Or Wo Contrast  Result Date: 05/24/2017 CLINICAL DATA:  67 year old female with 1 week of severe headache. No known injury. 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 EXAM: CT HEAD WITHOUT AND WITH CONTRAST TECHNIQUE: Contiguous axial images were obtained from the base of the skull through the vertex without and with intravenous contrast CONTRAST:  65m ISOVUE-300 IOPAMIDOL (ISOVUE-300) INJECTION 61% COMPARISON:  05/02/2017 head CT, brain MRI 07/24/2016 and earlier. FINDINGS: Brain: Stable cerebral volume. No midline shift, ventriculomegaly, mass effect, intracranial hemorrhage or evidence of cortically based acute infarction. No abnormal enhancement identified. Asymmetric hypodensity in the left cerebellum is new since the head CT 09/29/2015, and not correlated on the 2018 brain MRI. This is increased in conspicuity since January. There is no associated mass effect or enhancement. The caudal aspect of this area has a curvilinear morphology raising the possibility of encephalomalacia (series 2, image 5). Elsewhere stable gray-white matter differentiation throughout the brain, with chronic nonspecific cerebral white matter hypodensity. Vascular: Mild Calcified atherosclerosis at the skull base. Major intracranial vascular structures are enhancing. Skull: Bone mineralization remains stable. No acute or suspicious osseous lesion identified. Sinuses/Orbits:  Visualized paranasal sinuses and mastoids are stable and well pneumatized. Other: Visualized orbits and scalp soft tissues are within normal limits. IMPRESSION: 1. Nonspecific hypodense area in the left cerebellum has developed since 2018. No associated enhancement or mass effect to strongly suggest metastatic disease. This might be a late subacute cerebellar infarct. Recommend Brain MRI without and with contrast to further characterize. 2. No other acute finding. Electronically Signed   By: HGenevie AnnM.D.   On: 05/24/2017 08:23   Ct Chest W Contrast  Result Date: 06/10/2017 CLINICAL DATA:  Metastatic brain lesion. Recent pneumonia. History of COPD and small cell lung cancer. Progressive weakness, cough and shortness of breath. EXAM: CT CHEST WITH CONTRAST TECHNIQUE: Multidetector CT imaging of the chest was performed during intravenous contrast administration. CONTRAST:  758mISOVUE-300 IOPAMIDOL (ISOVUE-300) INJECTION 61% COMPARISON:  Radiographs 06/10/2017 and 05/24/2017.  CT 02/19/2017. FINDINGS: Cardiovascular: Mild atherosclerosis of the aorta, great vessels and coronary arteries. There is extrinsic mass effect on the SVC, right internal jugular vein and pulmonary arteries by extensive mediastinal lymphadenopathy, but no evidence of large vessel occlusion or acute pulmonary embolism. The heart size is normal. There is no pericardial effusion. Mediastinum/Nodes: Patient has developed extensive mediastinal and supraclavicular adenopathy. Representative nodes include a 19 mm right supraclavicular node (image 8/series 2), a 17 mm left supraclavicular node (image 12/2), a 4.3 x 3.5 cm right paratracheal node (image 54) and a 3.2 x 8.2 cm subcarinal node (image 73). There is no axillary adenopathy. The thyroid gland, trachea and esophagus demonstrate no significant findings. Lungs/Pleura: Interval development of a large complex left pleural effusion with multiple loculations and pleural based nodules. As shown on  recent radiographs, the left lung and central bronchi are completely collapsed. The right lung remains well aerated. There are right lung emphysematous changes and mild right apical scarring. No suspicious  right lung nodules. Upper abdomen: Stable low-density right adrenal nodule consistent with a benign adenoma based on stability. Developing right retrocrural and left celiac adenopathy. Musculoskeletal/Chest wall: There is no chest wall mass or suspicious osseous finding. There is paraspinal tumor in the left pleural space without definite neural foraminal or intraspinal extension. IMPRESSION: 1. Extensive mediastinal, supraclavicular and upper abdominal adenopathy consistent with recurrent small cell lung cancer. 2. There is extensive tumor throughout the left pleural space with a loculated large left pleural effusion and complete collapse of the left lung. 3. No new osseous findings.  Left pleural paraspinal tumor. Electronically Signed   By: Richardean Sale M.D.   On: 06/10/2017 15:17   Mr Jeri Cos FU Contrast  Result Date: 05/24/2017 CLINICAL DATA:  Acute headache.  History of lung cancer. EXAM: MRI HEAD WITHOUT AND WITH CONTRAST TECHNIQUE: Multiplanar, multiecho pulse sequences of the brain and surrounding structures were obtained without and with intravenous contrast. CONTRAST:  59m MULTIHANCE GADOBENATE DIMEGLUMINE 529 MG/ML IV SOLN COMPARISON:  Head CT from earlier today.  Brain MRI 07/24/2016 FINDINGS: Brain: The left inferior cerebellar abnormality reflects an enhancing superficial mass measuring up to 17 mm. The mass splays folia but is still likely intra-axial. No second lesion is seen. No infarct, hemorrhage, hydrocephalus, or collection. Chronic white matter disease, usually microvascular ischemic. Vascular: 3 mm enhancing outpouching extending inferiorly from the right MCA bifurcation. Skull and upper cervical spine: No noted metastatic lesion. Sinuses/Orbits: No visible marrow lesion. Other:  These results were called by telephone at the time of interpretation on 05/24/2017 at 10:46 am to Dr. VVaughan Basta, who verbally acknowledged these results. IMPRESSION: 1. 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 2. 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 At follow-up brain MRI a MRA is recommended. Electronically Signed   By: JMonte FantasiaM.D.   On: 05/24/2017 11:11   Dg Chest Port 1 View  Result Date: 06/11/2017 CLINICAL DATA:  67year old female with respiratory failure. EXAM: PORTABLE CHEST 1 VIEW COMPARISON:  Chest CT dated 06/10/2017 FINDINGS: There has been interval placement of an endotracheal tube with tip approximately 4.3 cm above the carina. An enteric tube extends into the left upper abdomen with tip in the proximal stomach. There is complete opacification of the left hemithorax. An area of hazy density in the right upper lung field adjacent to the overlying support tube noted. There is no pneumothorax. The cardiac borders are silhouetted. No acute osseous pathology. IMPRESSION: 1. Endotracheal tube above the carina and enteric tube in the proximal stomach. 2. Complete opacification of the left hemithorax. Electronically Signed   By: AAnner CreteM.D.   On: 06/11/2017 00:38    EKG: Atrial fibrillation with rapid ventricular rate nonspecific ST changes  Weights: Filed Weights   06/10/17 1224  Weight: 120 lb (54.4 kg)     Physical Exam: Blood pressure 122/82, pulse (!) 112, temperature 98.6 F (37 C), temperature source Oral, resp. rate (!) 25, height 5' 9.5" (1.765 m), weight 120 lb (54.4 kg), SpO2 95 %. Body mass index is 17.47 kg/m. General: Well developed, well nourished, in no acute distress. Head eyes ears nose throat: Normocephalic, atraumatic, sclera non-icteric, no xanthomas, nares are without discharge. No apparent thyromegaly and/or mass  Lungs: Normal respiratory effort.  Diffuse wheezes, no rales, some rhonchi.  Heart:  RRR with normal S1 S2. no murmur gallop, no rub, PMI is normal size and placement, carotid upstroke normal without bruit, jugular venous pressure is  normal Abdomen: Soft, non-tender, non-distended with normoactive bowel sounds. No hepatomegaly. No rebound/guarding. No obvious abdominal masses. Abdominal aorta is normal size without bruit Extremities: No edema. no cyanosis, no clubbing, no ulcers  Peripheral : 2+ bilateral upper extremity pulses, 2+ bilateral femoral pulses, 2+ bilateral dorsal pedal pulse Neuro: Is not alert and oriented. No facial asymmetry. No focal deficit. Moves all extremities spontaneously. Musculoskeletal: Normal muscle tone without kyphosis Psych: Does not responds to questions appropriately with a normal affect.    Assessment: 67 year old female with acute on chronic obstructive pulmonary disease with hypoxia and community-acquired pneumonia with respiratory failure requiring intubation and secondary atrial fibrillation with rapid ventricular rate without evidence of myocardial infarction or congestive heart failure  Plan: 1.  Continue supportive care respiratory failure and pneumonia and hypoxia 2.  Continue metoprolol for heart rate control of atrial fibrillation with a goal heart rate below 110 bpm 3.  Avoid use of amiodarone for concerns of possible hypersensitivity and/or pulmonary side effects 4.  Coagulation for further risk reduction and stroke while in atrial fibrillation 5.  Echocardiogram for LV systolic dysfunction valvular heart disease needing further adjustments of medication 6.  Further treatment options after above  Signed, Corey Skains M.D. [facility] 06/11/2017, 8:13 AM

## 2017-06-11 NOTE — Consult Note (Addendum)
Consultation Note Date: 06/11/2017   Patient Name: Sheri Ayala  DOB: [birth year]  MRN: [id number]  Age / Sex: 67 y.o., female  PCP: Alvester Chou, NP Referring Physician: Gladstone Lighter, MD  Reason for Consultation: Establishing goals of care  HPI/Patient Profile:  Sheri Ayala  is a 67 y.o. female with a known history of anxiety, atrial fibrillation, bronchospasm, cerebrovascular accident, COPD, depression, CLL, small cell lung cancer in remission, recently diagnosed metastasis and brain- was admitted to hospital 2 weeks ago with pneumonia and at that time because of headache her CT scan and MRI of the brain was done which diagnosed a new metastasis in the brain.  Thoracentesis today, 1.2 liters removed.  Clinical Assessment and Goals of Care: Patient resting in bed on ventilator. She opens her eyes briefly, waves and smiles. She closes eyes. Son states up until the day she was supposed to come for radiation, she has been feeling well. She is a fast walker and does not use assistive devices. Her oral intake has improved over the last few weeks.   He states she retired from [employer] and smokes. She used to smoke cigarettes and now smokes E-cigs. He states she lives alone and has several sisters, and many visitors.  We discussed diagnosis, prognosis, GOC, EOL wishes disposition and options.  A detailed discussion was had today regarding advanced directives.  Concepts specific to code status, artifical feeding and hydration, continued IV antibiotics and rehospitalization was had.  The difference between a aggressive medical intervention path  and a palliative comfort care path for this patient at this time was discussed.  Values and goals of care important to patient and family were attempted to be elicited.  He states his grandmother had a trach and lived at home for over 7 years, the family took turns providing  care, but it caused a lot of turmoil in the family about what shifts they would work.   He states upon being told about her brain cancer, she was asking immediatly what needed to be done, and stated she was going to beat this. Son wants everything done possible at this time.    Oncology in to speak with patient and family. Discussed tumor burden and treatment options if patient improves and is able to be extubated.  CCM in to assess patient.   Will return tomorrow to offer support.      SUMMARY OF RECOMMENDATIONS    Will follow up tomorrow.   Code Status/Advance Care Planning:  Full code    Symptom Management:   Per primary team.   Palliative Prophylaxis:   Oral Care   Psycho-social/Spiritual:   Chaplaincy following   Prognosis:   Very poor. Recurrence of lung CA, brain mets. COPD, currently on ventilator.   Discharge Planning: To Be Determined      Primary Diagnoses: Present on Admission: . Acute on chronic respiratory failure ([place]) . Pneumonia   I have reviewed the medical record, interviewed the patient and family, and examined the patient. The following aspects  are pertinent.  Past Medical History:  Diagnosis Date  . Allergy   . 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 Anxiety   . Asthma   . Atrial fibrillation ([place]) 04/2016  . Brain metastases ([place]) 2019  . Bronchospasm   . Cerebrovascular accident (CVA) ([place]) 08/28/2015  . Chronic lymphocytic leukemia ([place]) 2004  . COPD (chronic obstructive pulmonary disease) ([place])   . Depression   . Emphysema of lung ([place])   . Frontal sinusitis November 2012  . H/O viral encephalitis   . Headache(784.0)   . Heart murmur   . Mitral prolapse 11/24/2015  . Mitral regurgitation 11/24/2015  . Olecranon bursitis of left elbow November 2012.   in past  . Pleurisy   . Pneumonia    13 times  . Rhinitis   . Small cell lung cancer ([place])   . Thrombocytopenia ([place])   . Tobacco abuse   .  Tricuspid valve regurgitation 11/24/2015   Social History   Socioeconomic History  . Marital status: Single    Spouse name: None  . Number of children: None  . Years of education: None  . Highest education level: None  Social Needs  . Financial resource strain: None  . Food insecurity - worry: None  . Food insecurity - inability: None  . Transportation needs - medical: None  . Transportation needs - non-medical: None  Occupational History  . None  Tobacco Use  . Smoking status: Former Smoker    Packs/day: 0.00    Years: 45.00    Pack years: 0.00  . Smokeless tobacco: Never Used  Substance and Sexual Activity  . Alcohol use: No    Alcohol/week: 0.0 oz  . Drug use: No  . Sexual activity: Not Currently  Other Topics Concern  . None  Social History Narrative  . None   Family History  Problem Relation Age of Onset  . Aneurysm Mother   . Liver disease Father   . Congestive Heart Failure Brother   . Heart disease Brother   . Colon cancer Neg Hx   . Esophageal cancer Neg Hx   . Pancreatic cancer Neg Hx   . Rectal cancer Neg Hx   . Stomach cancer Neg Hx    Scheduled Meds: . budesonide (PULMICORT) nebulizer solution  0.5 mg Nebulization BID  . chlorhexidine gluconate (MEDLINE KIT)  15 mL Mouth Rinse BID  . famotidine  20 mg Per Tube BID  . heparin  5,000 Units Subcutaneous Q8H  . insulin aspart  0-9 Units Subcutaneous Q4H  . ipratropium-albuterol  3 mL Nebulization Q4H  . mouth rinse  15 mL Mouth Rinse 10 times per day  . methylPREDNISolone (SOLU-MEDROL) injection  40 mg Intravenous Q12H  . metoprolol tartrate  50 mg Per Tube BID  . sertraline  25 mg Per Tube QHS  . topiramate  50 mg Per Tube Daily   Continuous Infusions: . ceFEPime (MAXIPIME) IV Stopped (06/11/17 1043)  . fentaNYL infusion INTRAVENOUS 50 mcg/hr (06/11/17 0600)   PRN Meds:.docusate, fentaNYL, levalbuterol, LORazepam, metoprolol tartrate Medications Prior to Admission:  Prior to Admission  medications   Medication Sig Start Date End Date Taking? Authorizing Provider  albuterol (PROVENTIL) (2.5 MG/3ML) 0.083% nebulizer solution Take 3 mLs (2.5 mg total) by nebulization every 4 (four) hours as needed for wheezing or shortness of breath. 06/21/13  Yes Francine Graven, DO  ALPRAZolam Duanne Moron) 0.5 MG tablet Take 1 tablet (0.5 mg total) by mouth 2 (  two) times daily as needed for anxiety or sleep. 04/22/17  Yes Ladell Pier, MD  budesonide-formoterol [facility]) 160-4.5 MCG/ACT inhaler Take 2 puffs first thing in am and then another 2 puffs about 12 hours later. 04/29/16  Yes Tanda Rockers, MD  clotrimazole (MYCELEX) 10 MG troche Take 1 tablet (10 mg total) by mouth 4 (four) times daily. Take while on decadron. 06/04/17  Yes Ladell Pier, MD  dexamethasone (DECADRON) 4 MG tablet Take 1 tablet (4 mg total) by mouth every 12 (twelve) hours. 05/27/17 06/26/17 Yes Gladstone Lighter, MD  fluconazole (DIFLUCAN) 100 MG tablet Take 1 tablet (100 mg total) by mouth daily. Take for 4 days. 06/04/17  Yes Ladell Pier, MD  HYDROcodone-acetaminophen (NORCO) 5-325 MG tablet Take 1 tablet by mouth every 6 (six) hours as needed for severe pain. 06/04/17  Yes Ladell Pier, MD  OXYGEN Inhale 2 L into the lungs as needed (for shortness of breath).    Yes [provider]  pantoprazole (PROTONIX) 40 MG tablet Take 40 mg daily by mouth. 02/11/17  Yes [provider]  rizatriptan (MAXALT) 10 MG tablet Take 10 mg by mouth as needed.  05/17/17  Yes [provider]  sertraline (ZOLOFT) 25 MG tablet Take 25 mg by mouth at bedtime.  04/05/16  Yes [provider]  temazepam (RESTORIL) 30 MG capsule Take 1 capsule (30 mg total) by mouth at bedtime. 05/05/16  Yes Tat, Shanon Brow, MD  topiramate (TOPAMAX) 50 MG tablet Take 1 tablet by mouth daily. 05/17/17  Yes [provider]  VENTOLIN HFA 108 (90 Base) MCG/ACT inhaler Inhale 2 puffs into the lungs every 4 (four) hours as needed  for wheezing or shortness of breath.  07/05/16  Yes [provider]  feeding supplement, ENSURE ENLIVE, (ENSURE ENLIVE) LIQD Take 237 mLs by mouth 2 (two) times daily between meals. 05/06/16   Orson Eva, MD  polyethylene glycol (MIRALAX / Floria Raveling) packet Take 17 g by mouth 2 (two) times daily. Patient not taking: Reported on 04/04/2017 07/26/16   Bonnielee Haff, MD  prochlorperazine (COMPAZINE) 10 MG tablet Take 0.5 tablets (5 mg total) by mouth every 6 (six) hours as needed for nausea or vomiting. Patient not taking: Reported on 04/20/2017 10/08/16   Ladell Pier, MD   Allergies  Allergen Reactions  . Eliquis [Apixaban] Rash    Hives, and difficulty swallowing  . Cardizem Cd [Diltiazem Hcl Er Beads] Rash  . Citrus Other (See Comments)    Burning in stomach   . Penicillins Hives, Nausea And Vomiting, Swelling and Other (See Comments)    Patient reports tolerating ampicillin.  Has patient had a PCN reaction causing immediate rash, facial/tongue/throat swelling, SOB or lightheadedness with hypotension: Yes Has patient had a PCN reaction causing severe rash involving mucus membranes or skin necrosis: unknown Has patient had a PCN reaction that required hospitalization No Has patient had a PCN reaction occurring within the last 10 years: No If all of the above answers are "NO", then may proceed with Cephalosporin use.    Review of Systems  Unable to perform ROS   Physical Exam  Constitutional: No distress.  Pulmonary/Chest:  On ventilator  Skin: Skin is warm and dry.    Vital Signs: BP 114/67   Pulse (!) 108   Temp 98.9 F (37.2 C) (Oral)   Resp (!) 25   Ht 5' 9.5" (1.765 m)   Wt 54.4 kg (120 lb)   SpO2 91%  BMI 17.47 kg/m  Pain Assessment: No/denies pain POSS *See Group Information*: 1-Acceptable,Awake and alert Pain Score: 0-No pain   SpO2: SpO2: 91 % O2 Device:SpO2: 91 % O2 Flow Rate: .O2 Flow Rate (L/min): 3 L/min  IO: Intake/output summary:    Intake/Output Summary (Last 24 hours) at 06/11/2017 1335 Last data filed at 06/11/2017 1300 Gross per 24 hour  Intake 2565.05 ml  Output 600 ml  Net 1965.05 ml    LBM: Last BM Date: 06/11/17 Baseline Weight: Weight: 54.4 kg (120 lb) Most recent weight: Weight: 54.4 kg (120 lb)     Palliative Assessment/Data: On ventilator     Time In: 12:20 Time Out: 2:10 Time Total: 110 min Greater than 50%  of this time was spent counseling and coordinating care related to the above assessment and plan.  Signed by: Asencion Gowda, NP   Please contact Palliative Medicine Team phone at [phone] for questions and concerns.  For individual provider: See Shea Evans

## 2017-06-11 NOTE — Progress Notes (Signed)
*  PRELIMINARY RESULTS* Echocardiogram 2D Echocardiogram has been performed.  Sheri Ayala 06/11/2017, 9:51 AM

## 2017-06-11 NOTE — Progress Notes (Signed)
   06/11/17 1345  Clinical Encounter Type  Visited With Patient and family together  Visit Type Follow-up  Spiritual Encounters  Spiritual Needs Prayer;Emotional   Chaplain met with patient and son.  Conversation around recent health events of patient, discussion on importance of self-care in caregivers.  Other family arrived.  Chaplain prayed with patient and family.

## 2017-06-11 NOTE — Progress Notes (Signed)
Post abg fio2 to 40% tolerating well sat 98% [id number]

## 2017-06-11 NOTE — Progress Notes (Signed)
[facility] [place] NAME: Sheri Ayala    MR#:  [id number]  DATE OF BIRTH:  [birth year]  SUBJECTIVE:  CHIEF COMPLAINT:   Chief Complaint  Patient presents with  . Shortness of Breath   - Patient with history of small cell lung cancer who was here 2 weeks ago for new diagnosis of metastatic brain lesion and was discharged on Levaquin comes back with acute respiratory distress and noted to have left lower effusion and white out of left lung.  REVIEW OF SYSTEMS:  Review of Systems  Unable to perform ROS: Critical illness    DRUG ALLERGIES:   Allergies  Allergen Reactions  . Eliquis [Apixaban] Rash    Hives, and difficulty swallowing  . Cardizem Cd [Diltiazem Hcl Er Beads] Rash  . Citrus Other (See Comments)    Burning in stomach   . Penicillins Hives, Nausea And Vomiting, Swelling and Other (See Comments)    Patient reports tolerating ampicillin.  Has patient had a PCN reaction causing immediate rash, facial/tongue/throat swelling, SOB or lightheadedness with hypotension: Yes Has patient had a PCN reaction causing severe rash involving mucus membranes or skin necrosis: unknown Has patient had a PCN reaction that required hospitalization No Has patient had a PCN reaction occurring within the last 10 years: No If all of the above answers are "NO", then may proceed with Cephalosporin use.     VITALS:  Blood pressure 114/67, pulse (!) 108, temperature 98.9 F (37.2 C), temperature source Oral, resp. rate (!) 25, height 5' 9.5" (1.765 m), weight 54.4 kg (120 lb), SpO2 91 %.  PHYSICAL EXAMINATION:  Physical Exam  GENERAL:  67 y.o.-year-old patient lying in the bed, critically ill-appearing, intubated and sedated. EYES: Pupils equal, round, reactive to light and accommodation. No scleral icterus. Extraocular muscles intact.  HEENT: Head atraumatic, normocephalic. Oropharynx and nasopharynx clear. Orally intubated NECK:  Supple, no  jugular venous distention. No thyroid enlargement, no tenderness.  LUNGS: Expiratory wheezes noted all over the lung fields, decreased breath sounds at left base, no rales,rhonchi or crepitation. No use of accessory muscles of respiration.  CARDIOVASCULAR: S1, S2 normal. No murmurs, rubs, or gallops.  ABDOMEN: Soft, nontender, nondistended. Bowel sounds present. No organomegaly or mass.  EXTREMITIES: No pedal edema, cyanosis, or clubbing.  NEUROLOGIC: Patient is sedated, however easily arousable to talk much and following simple commands and trying to mouth words.  PSYCHIATRIC: The patient is alert  and following simple commands in spite of the light sedation she is on SKIN: 67 No obvious rash, lesion, or ulcer.    LABORATORY PANEL:   CBC Recent Labs  Lab 06/11/17 0432  WBC 27.3*  HGB 12.1  HCT 38.3  PLT 139*   ------------------------------------------------------------------------------------------------------------------  Chemistries  Recent Labs  Lab 06/10/17 1214 06/10/17 2309 06/11/17 0815  NA 132* 144  --   K 5.7* 7.0* 3.9  CL 89* 98*  --   CO2 33* 27  --   GLUCOSE 172* 235*  --   BUN 53* 46*  --   CREATININE 0.78 0.60  --   CALCIUM 8.8* 8.5*  --   MG 2.2  --   --   AST 48*  --   --   ALT 84*  --   --   ALKPHOS 85  --   --   BILITOT 0.8  --   --    ------------------------------------------------------------------------------------------------------------------  Cardiac Enzymes Recent Labs  Lab 06/10/17 2309  TROPONINI <0.03   ------------------------------------------------------------------------------------------------------------------  RADIOLOGY:  Dg Chest 2 View  Result Date: 06/10/2017 CLINICAL DATA:  Shortness of breath. History of COPD and lung cancer. History of brain cancer scheduled to start chemotherapy today. EXAM: CHEST  2 VIEW COMPARISON:  Chest x-ray dated 05/24/2017. FINDINGS: Complete opacification of the left chest, presumably large  pleural effusion. Associated slight rightward shift of the cardiomediastinal structures. Right lung is clear. No acute or suspicious osseous finding. IMPRESSION: New complete opacification of the left hemithorax, presumably due to large pleural effusion. Electronically Signed   By: Franki Cabot M.D.   On: 06/10/2017 13:36   Dg Abd 1 View  Result Date: 06/11/2017 CLINICAL DATA:  OG tube placement EXAM: ABDOMEN - 1 VIEW COMPARISON:  07/21/2016 FINDINGS: OG tube tip is in the distal esophagus near the GE junction and could be advanced several cm. IMPRESSION: OG tube tip in the distal esophagus near the GE junction. Electronically Signed   By: Rolm Baptise M.D.   On: 06/11/2017 00:35   Ct Chest W Contrast  Result Date: 06/10/2017 CLINICAL DATA:  Metastatic brain lesion. Recent pneumonia. History of COPD and small cell lung cancer. Progressive weakness, cough and shortness of breath. EXAM: CT CHEST WITH CONTRAST TECHNIQUE: Multidetector CT imaging of the chest was performed during intravenous contrast administration. CONTRAST:  84mL ISOVUE-300 IOPAMIDOL (ISOVUE-300) INJECTION 61% COMPARISON:  Radiographs 06/10/2017 and 05/24/2017.  CT 02/19/2017. FINDINGS: Cardiovascular: Mild atherosclerosis of the aorta, great vessels and coronary arteries. There is extrinsic mass effect on the SVC, right internal jugular vein and pulmonary arteries by extensive mediastinal lymphadenopathy, but no evidence of large vessel occlusion or acute pulmonary embolism. The heart size is normal. There is no pericardial effusion. Mediastinum/Nodes: Patient has developed extensive mediastinal and supraclavicular adenopathy. Representative nodes include a 19 mm right supraclavicular node (image 8/series 2), a 17 mm left supraclavicular node (image 12/2), a 4.3 x 3.5 cm right paratracheal node (image 54) and a 3.2 x 8.2 cm subcarinal node (image 73). There is no axillary adenopathy. The thyroid gland, trachea and esophagus demonstrate no  significant findings. Lungs/Pleura: Interval development of a large complex left pleural effusion with multiple loculations and pleural based nodules. As shown on recent radiographs, the left lung and central bronchi are completely collapsed. The right lung remains well aerated. There are right lung emphysematous changes and mild right apical scarring. No suspicious right lung nodules. Upper abdomen: Stable low-density right adrenal nodule consistent with a benign adenoma based on stability. Developing right retrocrural and left celiac adenopathy. Musculoskeletal/Chest wall: There is no chest wall mass or suspicious osseous finding. There is paraspinal tumor in the left pleural space without definite neural foraminal or intraspinal extension. IMPRESSION: 1. Extensive mediastinal, supraclavicular and upper abdominal adenopathy consistent with recurrent small cell lung cancer. 2. There is extensive tumor throughout the left pleural space with a loculated large left pleural effusion and complete collapse of the left lung. 3. No new osseous findings.  Left pleural paraspinal tumor. Electronically Signed   By: Richardean Sale M.D.   On: 06/10/2017 15:17   Dg Chest Port 1 View  Result Date: 06/11/2017 CLINICAL DATA:  Status post thoracentesis EXAM: PORTABLE CHEST 1 VIEW COMPARISON:  Study obtained earlier in the day FINDINGS: Endotracheal tube tip is 5.1 cm above the carina. Nasogastric tube tip and side port are below the diaphragm. No pneumothorax. There remains diffuse opacification of the left hemithorax. Right lung is clear. The heart size appears within normal limits. Pulmonary vascularity on the right appears normal. Pulmonary  vascularity on the left is obscured. No adenopathy appreciable in visualized regions. There is midthoracic dextroscoliosis. IMPRESSION: Tube positions as described without pneumothorax. Right lung clear. Diffuse opacification of the left hemithorax remains. Question a degree of residual  effusion versus consolidation. There may also be a degree of mucous plugging or obstructing lesion in the left main bronchus. This combination of findings may warrant chest CT, ideally with intravenous contrast, for further delineation. Electronically Signed   By: Lowella Grip III M.D.   On: 06/11/2017 12:16   Dg Chest Port 1 View  Result Date: 06/11/2017 CLINICAL DATA:  67 year old female with respiratory failure. EXAM: PORTABLE CHEST 1 VIEW COMPARISON:  Chest CT dated 06/10/2017 FINDINGS: There has been interval placement of an endotracheal tube with tip approximately 4.3 cm above the carina. An enteric tube extends into the left upper abdomen with tip in the proximal stomach. There is complete opacification of the left hemithorax. An area of hazy density in the right upper lung field adjacent to the overlying support tube noted. There is no pneumothorax. The cardiac borders are silhouetted. No acute osseous pathology. IMPRESSION: 1. Endotracheal tube above the carina and enteric tube in the proximal stomach. 2. Complete opacification of the left hemithorax. Electronically Signed   By: Anner Crete M.D.   On: 06/11/2017 00:38   Dg Abd Portable 1v  Result Date: 06/11/2017 CLINICAL DATA:  Orogastric tube placement. EXAM: PORTABLE ABDOMEN - 1 VIEW COMPARISON:  Study obtained earlier in the day FINDINGS: Orogastric tube tip and side port in the distal stomach. Visualized bowel gas pattern is unremarkable. No obstruction or free air. Diffuse consolidation left lung noted. IMPRESSION: Orogastric tube tip and side port in stomach. Visualized bowel gas pattern unremarkable. Electronically Signed   By: Lowella Grip III M.D.   On: 06/11/2017 12:17   US Thoracentesis Asp Pleural Space W/img Guide  Result Date: 06/11/2017 INDICATION: Metastatic lung cancer. Respiratory failure. Left-sided pleural effusion. Request for diagnostic and therapeutic thoracentesis. EXAM: ULTRASOUND GUIDED LEFT  THORACENTESIS MEDICATIONS: N None one. COMPLICATIONS: None immediate. Postprocedural chest x-ray negative for pneumothorax. PROCEDURE: An ultrasound guided thoracentesis was thoroughly discussed with the patient and questions answered. The benefits, risks, alternatives and complications were also discussed. The patient understands and wishes to proceed with the procedure. Written consent was obtained. Ultrasound was performed to localize and mark an adequate pocket of fluid in the left chest. The area was then prepped and draped in the normal sterile fashion. 1% Lidocaine was used for local anesthesia. Under ultrasound guidance a Safe-T-Centesis catheter was introduced. Thoracentesis was performed. The catheter was removed and a dressing applied. FINDINGS: A total of approximately 1.2 L of hazy, amber colored fluid was removed. Samples were sent to the laboratory as requested by the clinical team. IMPRESSION: Successful ultrasound guided left thoracentesis yielding 1.2 L of pleural fluid. Electronically Signed   By: Markus Daft M.D.   On: 06/11/2017 12:33    EKG:   Orders placed or performed during the hospital encounter of 06/10/17  . ED EKG 12-Lead  . ED EKG 12-Lead  . EKG 12-Lead  . EKG 12-Lead  . EKG 12-Lead  . EKG 12-Lead    ASSESSMENT AND PLAN:   67 year old female with past medical history significant for small cell lung cancer, COPD on 2 L home oxygen chronically, mild CLL, recent diagnosis of left cerebellar metastatic brain lesion on Decadron, A. fib presents to the hospital secondary to worsening shortness of breath.  1. Acute hypoxic respiratory  failure-secondary to left-sided pleural effusion and white out of left lung, likely recurrence of small cell lung cancer. -Initially admitted to the floor on nasal cannula. Was receiving amiodarone for her increased heart rate, went into acute respiratory distress and currently intubated. On 40% FiO2 this morning. -Interventional radiology  consult for thoracentesis on the left side. -Further management by the intensivist -Continue antibiotics with cefepime  2. A. fib with RVR-likely worsened with her respiratory condition. Rate controlled now. Appreciate cardiology consult. -Avoid amiodarone. Continue metoprolol. With her brain metastatic lesion, not a candidate for anticoagulation.  3. Hyperkalemia-noted to have hyperkalemia during cold last night. Corrected this morning  4. Metastatic brain metastases-noted to have left inferior cerebellar mass based on the MRI during last admission 2 weeks ago. Was on Decadron at home and supposed to start whole brain radiation yesterday. Currently on hold -Appreciate oncology consult  5. CLL with extreme leukocytosis on admission-likely secondary to recent steroid use and reactive leukocytosis. -Continue to monitor at this time. Improved now.  6. Recurrent small cell lung cancer-appreciate oncology consult. Last chemotherapy was in August 2018.  7. DVT prophylaxis-continue subcutaneous heparin.   All the records are reviewed and case discussed with Care Management/Social Workerr. Management plans discussed with the patient, family and they are in agreement.  CODE STATUS: Full code  TOTAL TIME TAKING CARE OF THIS PATIENT: 41 minutes.   POSSIBLE D/C IN 2-3 DAYS, DEPENDING ON CLINICAL CONDITION.   Giuseppe Duchemin M.D on 06/11/2017 at 1:43 PM  Between 7am to 6pm - Pager - [phone]  After 6pm go to [email or web address] - password [email or web address]  Office  [phone]  CC: Primary care physician; Alvester Chou, NP

## 2017-06-12 ENCOUNTER — Ambulatory Visit: Payer: Medicare HMO

## 2017-06-12 ENCOUNTER — Inpatient Hospital Stay: Payer: Medicare HMO

## 2017-06-12 DIAGNOSIS — C3490 Malignant neoplasm of unspecified part of unspecified bronchus or lung: Secondary | ICD-10-CM

## 2017-06-12 DIAGNOSIS — J9621 Acute and chronic respiratory failure with hypoxia: Secondary | ICD-10-CM

## 2017-06-12 DIAGNOSIS — J90 Pleural effusion, not elsewhere classified: Secondary | ICD-10-CM

## 2017-06-12 DIAGNOSIS — J9620 Acute and chronic respiratory failure, unspecified whether with hypoxia or hypercapnia: Secondary | ICD-10-CM

## 2017-06-12 LAB — GLUCOSE, CAPILLARY
GLUCOSE-CAPILLARY: 165 mg/dL — AB (ref 65–99)
GLUCOSE-CAPILLARY: 163 mg/dL — AB (ref 65–99)
GLUCOSE-CAPILLARY: 109 mg/dL — AB (ref 65–99)
Glucose-Capillary: 113 mg/dL — ABNORMAL HIGH (ref 65–99)
Glucose-Capillary: 118 mg/dL — ABNORMAL HIGH (ref 65–99)
Glucose-Capillary: 163 mg/dL — ABNORMAL HIGH (ref 65–99)
Glucose-Capillary: 164 mg/dL — ABNORMAL HIGH (ref 65–99)

## 2017-06-12 LAB — CBC
HCT: 41.2 % (ref 35.0–47.0)
Hemoglobin: 12.7 g/dL (ref 12.0–16.0)
MCH: 26.3 pg (ref 26.0–34.0)
MCHC: 30.7 g/dL — AB (ref 32.0–36.0)
MCV: 85.6 fL (ref 80.0–100.0)
PLATELETS: 136 10*3/uL — AB (ref 150–440)
RBC: 4.81 MIL/uL (ref 3.80–5.20)
RDW: 19.7 % — AB (ref 11.5–14.5)
WBC: 38.3 10*3/uL — ABNORMAL HIGH (ref 3.6–11.0)

## 2017-06-12 LAB — CYTOLOGY - NON PAP

## 2017-06-12 LAB — MAGNESIUM: Magnesium: 2.1 mg/dL (ref 1.7–2.4)

## 2017-06-12 LAB — BASIC METABOLIC PANEL
Anion gap: 9 (ref 5–15)
BUN: 48 mg/dL — ABNORMAL HIGH (ref 6–20)
CO2: 31 mmol/L (ref 22–32)
Calcium: 8.5 mg/dL — ABNORMAL LOW (ref 8.9–10.3)
Chloride: 100 mmol/L — ABNORMAL LOW (ref 101–111)
Creatinine, Ser: 0.71 mg/dL (ref 0.44–1.00)
GFR calc Af Amer: >60 mL/min (ref >60)
GLUCOSE: 175 mg/dL — AB (ref 65–99)
Potassium: 4.5 mmol/L (ref 3.5–5.1)
Sodium: 140 mmol/L (ref 135–145)

## 2017-06-12 LAB — PH, BODY FLUID: PH, BODY FLUID: 7.4

## 2017-06-12 LAB — PROTEIN, BODY FLUID (OTHER): Total Protein, Body Fluid Other: 2.3 g/dL

## 2017-06-12 LAB — PROCALCITONIN: Procalcitonin: 0.19 ng/mL

## 2017-06-12 LAB — PHOSPHORUS: PHOSPHORUS: 3 mg/dL (ref 2.5–4.6)

## 2017-06-12 NOTE — Progress Notes (Signed)
Sheri Ayala at [place] NAME: Sheri Ayala    MR#:  [id number]  DATE OF BIRTH:  07-03-1950  SUBJECTIVE:  CHIEF COMPLAINT:   Chief Complaint  Patient presents with  . 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 Eliquis [Apixaban] Rash    Hives, and difficulty swallowing  . Cardizem Cd [Diltiazem Hcl Er Beads] Rash  . Citrus Other (See Comments)    Burning in stomach   . Penicillins Hives, Nausea And Vomiting, Swelling and Other (See Comments)    Patient reports tolerating ampicillin.  Has patient had a PCN reaction causing immediate rash, facial/tongue/throat swelling, SOB or lightheadedness with hypotension: Yes Has patient had a PCN reaction causing severe rash involving mucus membranes or skin necrosis: unknown Has patient had a PCN reaction that required hospitalization No Has patient had a PCN reaction occurring within the last 10 years: No If all of the above answers are "NO", then may proceed with Cephalosporin use.     VITALS:  Blood pressure 112/65, pulse (!) 115, temperature 100 F (37.8 C), temperature source Oral, resp. rate 17, height 5\' 9"  (1.753 m), weight 61.7 kg (136 lb 0.4 oz), SpO2 92 %.  PHYSICAL EXAMINATION:  Physical Exam  GENERAL:  67 y.o.-year-old patient lying in the bed, critically ill-appearing, intubated and sedated. EYES: Pupils equal, round, reactive to light and accommodation. No scleral icterus. Extraocular muscles intact.  HEENT: Head atraumatic, normocephalic. Oropharynx and nasopharynx clear. Orally intubated NECK:  Supple, no jugular venous distention. No thyroid enlargement, no tenderness.  LUNGS: Expiratory wheezes noted all over the lung fields, decreased breath  sounds at left base, no rales,rhonchi or crepitation. No use of accessory muscles of respiration.  CARDIOVASCULAR: S1, S2 normal. No murmurs, rubs, or gallops.  ABDOMEN: Soft, nontender, nondistended. Bowel sounds present. No organomegaly or mass.  EXTREMITIES: No pedal edema, cyanosis, or clubbing.  NEUROLOGIC: Patient is sedated, however easily arousable to palpation and following simple commands and trying to mouth words.  PSYCHIATRIC: The patient is on sedation this AM but still able to nod head and follow simple commands when sedation is weaned.  SKIN: No obvious rash, lesion, or ulcer.    LABORATORY PANEL:   CBC Recent Labs  Lab 06/12/17 0643  WBC 38.3*  HGB 12.7  HCT 41.2  PLT 136*   ------------------------------------------------------------------------------------------------------------------  Chemistries  Recent Labs  Lab 06/10/17 1214  06/12/17 0643  NA 132*   < > 140  K 5.7*   < > 4.5  CL 89*   < > 100*  CO2 33*   < > 31  GLUCOSE 172*   < > 175*  BUN 53*   < > 48*  CREATININE 0.78   < > 0.71  CALCIUM 8.8*   < > 8.5*  MG 2.2   < > 2.1  AST 48*  --   --   ALT 84*  --   --   ALKPHOS 85  --   --   BILITOT 0.8  --   --    < > = values in this interval not displayed.   ------------------------------------------------------------------------------------------------------------------  Cardiac Enzymes Recent Labs  Lab 06/10/17 2309  TROPONINI <0.03   ------------------------------------------------------------------------------------------------------------------  RADIOLOGY:  Dg Abd 1 View  Result Date: 06/11/2017 CLINICAL DATA:  OG tube placement EXAM: ABDOMEN - 1 VIEW COMPARISON:  07/21/2016 FINDINGS: OG tube tip is in the distal esophagus near the GE junction and could be advanced several cm. IMPRESSION: OG tube tip in the distal esophagus near the GE junction. Electronically Signed   By: Rolm Baptise M.D.   On: 06/11/2017 00:35   Dg Chest Port 1  View  Result Date: 06/11/2017 CLINICAL DATA:  Status post thoracentesis EXAM: PORTABLE CHEST 1 VIEW COMPARISON:  Study obtained earlier in the day FINDINGS: Endotracheal tube tip is 5.1 cm above the carina. Nasogastric tube tip and side port are below the diaphragm. No pneumothorax. There remains diffuse opacification of the left hemithorax. Right lung is clear. The heart size appears within normal limits. Pulmonary vascularity on the right appears normal. Pulmonary vascularity on the left is obscured. No adenopathy appreciable in visualized regions. There is midthoracic dextroscoliosis. IMPRESSION: Tube positions as described without pneumothorax. Right lung clear. Diffuse opacification of the left hemithorax remains. Question a degree of residual effusion versus consolidation. There may also be a degree of mucous plugging or obstructing lesion in the left main bronchus. This combination of findings may warrant chest CT, ideally with intravenous contrast, for further delineation. Electronically Signed   By: Lowella Grip III M.D.   On: 06/11/2017 12:16   Dg Chest Port 1 View  Result Date: 06/11/2017 CLINICAL DATA:  67 year old female with respiratory failure. EXAM: PORTABLE CHEST 1 VIEW COMPARISON:  Chest CT dated 06/10/2017 FINDINGS: There has been interval placement of an endotracheal tube with tip approximately 4.3 cm above the carina. An enteric tube extends into the left upper abdomen with tip in the proximal stomach. There is complete opacification of the left hemithorax. An area of hazy density in the right upper lung field adjacent to the overlying support tube noted. There is no pneumothorax. The cardiac borders are silhouetted. No acute osseous pathology. IMPRESSION: 1. Endotracheal tube above the carina and enteric tube in the proximal stomach. 2. Complete opacification of the left hemithorax. Electronically Signed   By: Anner Crete M.D.   On: 06/11/2017 00:38   Dg Abd Portable  1v  Result Date: 06/12/2017 CLINICAL DATA:  NG tube placement EXAM: PORTABLE ABDOMEN - 1 VIEW COMPARISON:  06/11/2017 FINDINGS: The external pacer paddles are been removed. The NG tube tip is in the antropyloric region of the stomach. The bowel gas pattern is unremarkable. Persistent opacified left hemithorax. IMPRESSION: NG tube in good position with the tip in the antropyloric region. Persistent opacified left hemithorax. Electronically Signed   By: Marijo Sanes M.D.   On: 06/12/2017 11:17   Dg Abd Portable 1v  Result Date: 06/11/2017 CLINICAL DATA:  Orogastric tube placement. EXAM: PORTABLE ABDOMEN - 1 VIEW COMPARISON:  Study obtained earlier in the day FINDINGS: Orogastric tube tip and side port in the distal stomach. Visualized bowel gas pattern is unremarkable. No obstruction or free air. Diffuse consolidation left lung noted. IMPRESSION: Orogastric tube tip and side port in stomach. Visualized bowel gas pattern unremarkable. Electronically Signed   By: Lowella Grip III M.D.   On: 06/11/2017 12:17   US Thoracentesis Asp Pleural Space W/img Guide  Result Date: 06/11/2017 INDICATION: Metastatic lung cancer. Respiratory failure. Left-sided pleural effusion. Request for diagnostic and therapeutic thoracentesis. EXAM: ULTRASOUND GUIDED LEFT THORACENTESIS MEDICATIONS: N None one. COMPLICATIONS: None immediate. Postprocedural chest x-ray negative for pneumothorax. PROCEDURE: An ultrasound guided thoracentesis was thoroughly  discussed with the patient and questions answered. The benefits, risks, alternatives and complications were also discussed. The patient understands and wishes to proceed with the procedure. Written consent was obtained. Ultrasound was performed to localize and mark an adequate pocket of fluid in the left chest. The area was then prepped and draped in the normal sterile fashion. 1% Lidocaine was used for local anesthesia. Under ultrasound guidance a Safe-T-Centesis catheter was  introduced. Thoracentesis was performed. The catheter was removed and a dressing applied. FINDINGS: A total of approximately 1.2 L of hazy, amber colored fluid was removed. Samples were sent to the laboratory as requested by the clinical team. IMPRESSION: Successful ultrasound guided left thoracentesis yielding 1.2 L of pleural fluid. Electronically Signed   By: Markus Daft M.D.   On: 06/11/2017 12:33    EKG:   Orders placed or performed during the hospital encounter of 06/10/17  . ED EKG 12-Lead  . ED EKG 12-Lead  . EKG 12-Lead  . EKG 12-Lead  . EKG 12-Lead  . EKG 12-Lead    ASSESSMENT AND PLAN:   67 year old female with past medical history significant for small cell lung cancer, COPD on 2 L home oxygen chronically, mild CLL, recent diagnosis of left cerebellar metastatic brain lesion on Decadron, A. fib presents to the hospital secondary to worsening shortness of breath.  1. Acute hypoxic respiratory failure-secondary to left-sided pleural effusion and white out of left lung, likely recurrence of small cell lung cancer.large mass behind left main bronchus -Initially admitted to the floor on nasal cannula. Was receiving amiodarone for her increased heart rate, went into acute respiratory distress and currently intubated. On 40% FiO2 still  this morning. -s/p  thoracentesis on the left side with no significant improvement in CXR. Cardio thoracic surgery consulted to see if any further measures. -Further management by the intensivist -Continue antibiotics with cefepime - overall poor prognosis from rapidly progressing recurrent small cell lung cancer - oncology consulted.  2. A. fib with RVR-likely worsened with her respiratory condition. Rate controlled now. Appreciate cardiology consult. -Avoid amiodarone. Continue metoprolol. With her brain metastatic lesion, not a candidate for anticoagulation.  3. Metastatic brain metastases-noted to have left inferior cerebellar mass based on the  MRI during last admission 2 weeks ago. Was on Decadron at home and supposed to start whole brain radiation prior to admission. Currently on hold -Appreciate oncology consult  4. CLL with extreme leukocytosis on admission-likely secondary to recent steroid use and reactive leukocytosis. -Continue to monitor at this time. Improved now.  5. DVT prophylaxis-continue subcutaneous heparin.  Overall poor prognosis, recommend palliative care consult   All the records are reviewed and case discussed with Care Management/Social Workerr. Management plans discussed with the patient, family and they are in agreement.  CODE STATUS: Full code  TOTAL TIME TAKING CARE OF THIS PATIENT: 38  minutes.   POSSIBLE D/C IN 2-3 DAYS, DEPENDING ON CLINICAL CONDITION.   Gladstone Lighter M.D on 06/12/2017 at 3:02 PM  Between 7am to 6pm - Pager - [phone]  After 6pm go to [email or web address] - password [email or web address] [facility]  Office  [phone]  CC: Primary care physician; Alvester Chou, NP

## 2017-06-12 NOTE — Progress Notes (Addendum)
Daily Progress Note   Patient Name: Sheri Ayala       Date: 06/12/2017 DOB: July 15, 1950  Age: 67 y.o. MRN#: [id number] Attending Physician: Gladstone Lighter, MD Primary Care Physician: Alvester Chou, NP Admit Date: 06/10/2017  Reason for Consultation/Follow-up: Establishing goals of care  Subjective: Sheri Ayala is resting in bed intubated. Spoke with  Son and daughter in law about her status and discussed her prognosis.  Light sedation, she nods that she knows what her status and plan is. She nods that she wants breathing tube removed and would not want it replaced. Son would like to wait for her sisters to visit before extubating. Sisters to bedside to visit.  Family waiting to speak to CCM about status prior to one way extubation. Meeting with CCM, family, and myself. Son would like to plan for one way extubation tomorrow at 12:00 so that more friends and family can come to see patient.   Length of Stay: 2  Current Medications: Scheduled Meds:  . budesonide (PULMICORT) nebulizer solution  0.5 mg Nebulization BID  . chlorhexidine gluconate (MEDLINE KIT)  15 mL Mouth Rinse BID  . famotidine  20 mg Per Tube BID  . feeding supplement (VITAL 1.5 CAL)  1,000 mL Per Tube Q24H  . heparin  5,000 Units Subcutaneous Q8H  . insulin aspart  0-9 Units Subcutaneous Q4H  . ipratropium-albuterol  3 mL Nebulization Q4H  . mouth rinse  15 mL Mouth Rinse 10 times per day  . methylPREDNISolone (SOLU-MEDROL) injection  40 mg Intravenous Q12H  . metoprolol tartrate  50 mg Per Tube BID  . multivitamin  15 mL Per Tube Daily  . sertraline  25 mg Per Tube QHS  . topiramate  50 mg Per Tube Daily    Continuous Infusions: . ceFEPime (MAXIPIME) IV Stopped (06/12/17 1111)  . fentaNYL infusion INTRAVENOUS 75 mcg/hr  (06/12/17 0844)    PRN Meds: docusate, fentaNYL, levalbuterol, LORazepam, metoprolol tartrate  Physical Exam  Constitutional: No distress.  Pulmonary/Chest:  Intubated  Neurological:  Awakens to voice.             Vital Signs: BP 126/72   Pulse (!) 132   Temp 99.8 F (37.7 C) (Oral)   Resp (!) 30   Ht 5' 9" (1.753 m)   Wt 61.7 kg (136 lb  0.4 oz)   SpO2 93%   BMI 20.09 kg/m  SpO2: SpO2: 93 % O2 Device: O2 Device: Ventilator O2 Flow Rate: O2 Flow Rate (L/min): 3 L/min  Intake/output summary:   Intake/Output Summary (Last 24 hours) at 06/12/2017 1305 Last data filed at 06/12/2017 1111 Gross per 24 hour  Intake 1496.2 ml  Output 575 ml  Net 921.2 ml   LBM: Last BM Date: 06/11/17 Baseline Weight: Weight: 54.4 kg (120 lb) Most recent weight: Weight: 61.7 kg (136 lb 0.4 oz)       Palliative Assessment/Data: Intubated      Patient Active Problem List   Diagnosis Date Noted  . Fall at home, initial encounter 06/10/2017  . Atrial fibrillation with RVR ([place]) 06/10/2017  . Pneumonia 06/10/2017  . Acute on chronic respiratory failure ([place]) 05/24/2017  . Brain metastases ([place]) 05/24/2017  . PICC (peripherally inserted central catheter) flush 09/05/2016  . Port catheter in place 08/15/2016  . Small cell lung cancer in adult ([place]) 07/25/2016  . Lobar pneumonia ([place]) 07/20/2016  . Hilar adenopathy 07/20/2016  . Community acquired pneumonia 07/16/2016  . Leukocytosis 07/16/2016  . Obstructive atelectasis 07/15/2016  . Pleural effusion   . S/P thoracentesis   . Pleural effusion on right   . History of CVA (cerebrovascular accident) 05/01/2016  . HCAP (healthcare-associated pneumonia) 04/30/2016  . Chronic a-fib ([place]) 04/30/2016  . Severe protein-calorie malnutrition ([place])   . Mass of left lung 04/10/2016  . Acute on chronic respiratory failure with hypoxia ([place]) 01/30/2015  . Right lower lobe pneumonia ([place]) 01/30/2015  . SOB (shortness of breath) 12/24/2012  .  COPD with acute exacerbation ([place]) 12/22/2012  . Headache in front of head 03/27/2011  . Thrombocytopenia ([place]) 03/27/2011  . CLL (chronic lymphocytic leukemia) ([place]) 03/09/2011  . Cigarette smoker 03/09/2011    Palliative Care Assessment & Plan   Patient Profile:  Sheri Ayala a66 y.o.femalewith a known history of anxiety, atrial fibrillation, bronchospasm, cerebrovascular accident, COPD, depression, CLL, small cell lung cancer in remission, recently diagnosed metastasis and brain-was admitted to hospital 2 weeks ago with pneumonia and at that time because of headache her CT scan and MRI of the brain was done which diagnosed a new metastasis in the brain.   Assessment/ Recommendations/Plan:  Plan for one way extubation today. Family would like to speak with CCM first.    Goals of Care and Additional Recommendations:   Code Status:    Code Status Orders  (From admission, onward)        Start     Ordered   06/10/17 [place]  Full code  Continuous     06/10/17 1644    Code Status History    Date Active Date Inactive Code Status Order ID Comments User Context   05/24/2017 13:12 05/27/2017 15:06 DNR [id number]  Vaughan Basta, MD ED   07/15/2016 20:20 07/27/2016 18:40 Full Code [id number]  Vianne Bulls, MD ED   04/30/2016 20:16 05/07/2016 18:25 Full Code [id number]  Barton Dubois, MD Inpatient   04/18/2016 10:37 04/19/2016 16:02 Full Code [id number]  Erick Colace, NP Inpatient   01/29/2015 20:59 01/31/2015 17:21 Full Code [id number]  Henreitta Leber, MD Inpatient   12/22/2012 21:21 12/25/2012 17:35 Full Code [id number]  Rise Patience, MD Inpatient   03/27/2011 17:44 03/29/2011 17:01 Full Code [id number]  Rexene Alberts, MD ED   03/09/2011 23:04 03/12/2011 17:41 Full Code [id number]  Ron Parker, RN Inpatient    Advance Directive Documentation  Most Recent Value  Type of Advance Directive  Healthcare Power of Attorney, Living will  Pre-existing out of facility DNR order  (yellow form or pink MOST form)  No data  "MOST" Form in Place?  No data       Prognosis:   Poor, plans for once way extubation tomorrow.  Discharge Planning:  Anticipated Hospital Death  Care plan was discussed with CCM and RN.   Thank you for allowing the Palliative Medicine Team to assist in the care of this patient.   Time In: 12:20 Time Out: 1:50 Total Time 90 min Prolonged Time Billed  Yes      Greater than 50%  of this time was spent counseling and coordinating care related to the above assessment and plan.  Crystal Griffin, NP  Please contact Palliative Medicine Team phone at [phone] for questions and concerns.      

## 2017-06-12 NOTE — Consult Note (Signed)
PULMONARY / CRITICAL CARE MEDICINE   Name: Sheri Ayala MRN: [id number] DOB: 26-Apr-1950    ADMISSION DATE:  06/10/2017 CONSULTATION DATE: 06/10/2017  REFERRING MD: Dr. 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  She presented to [facility] ER via EMS on 02/25 with c/o dizziness resulting in lost of balance and a fall at home.  Upon EMS arrival her O2 sats on RA were in the 80's with diffuse wheezing, she does wear 2L O2 at baseline.  She also endorsed low grade temps 100.2 F, N/V/D, and shortness of breath.  In the ER she was found to be in atrial fibrillation with rvr hr 120-130's and bp stable. Therefore, she received iv metoprolol and 1L NS bolus with improvement of hr 110's.  CXR revealed new complete opacification of the left hemithorax, presumably due to large pleural effusion possible empyema.  She was recently discharged from [facility] on 05/27/17 following treatment of pneumonia.  She has a hx of CLL (with watchful waiting) and small cell lung cancer (treatment with chemotherapy last chemo 11/21/2017).  During recent hospitalization she was diagnosed with CNS metastasis, she has not started chemotherapy or radiation. Due to current symptoms on 02/25 she was subsequently admitted to the telemetry unit by hospitalist team for further workup and treatment Oncology consulted.  On 06/10/17 CT Chest revealed extensive mediastinal, supraclavicular, and upper abdominal adenopathy consistent with recurrent small cell lung cancer, extensive tumor throughout the left pleural space with a loculated large left pleural effusion with  complete collapse of the left lung, and left pleural paraspinal tumor.  Oncology recommended IR consult for palliative thoracentesis with placement of pigtail catheter given likelihood of fast re-accumulation of pleural effusion.  On 06/11/17 she developed recurrence of atrial fibrillation with rvr, therefore amiodarone bolus and gtt ordered.  During administration of amiodarone bolus pt developed agonal respiration requiring mechanical intubation.  She was subsequently transferred to ICU and PCCM consulted for vent management.     SUBJECTIVE Remains intubated Critically il S/p thoracentesis Successful ultrasound guided left thoracentesis yielding 1.2 L of pleural fluid.  Left lung remains collapsed and riddled with cancer  REVIEW OF SYSTEMS:   Unable to assess pt intubated     VITAL SIGNS: BP 128/73   Pulse (!) 121   Temp (!) 97.2 F (36.2 C) (Axillary)   Resp 20   Ht 5\' 9"  (1.753 m)   Wt 136 lb 0.4 oz (61.7 kg)   SpO2 93%   BMI 20.09 kg/m   VENTILATOR SETTINGS: Vent Mode: PRVC FiO2 (%):  [40 %] 40 % Set Rate:  [16 bmp-25 bmp] 16 bmp Vt Set:  [450 mL] 450 mL PEEP:  [5 cmH20] 5 cmH20  INTAKE / OUTPUT: I/O last 3 completed shifts: In: 2229.4 [P.O.:300; I.V.:820.1; NG/GT:659.3; IV Piggyback:450] Out: 5102 [Urine:1175]  PHYSICAL EXAMINATION: General: acutely ill frail appearing female mechanically intubated  Neuro: sedated, not following commands, PERRL HEENT: supple, mild JVD  Cardiovascular: irregular irregular, no R/G Lungs: diffuse wheezes throughout, even, non labored  Abdomen: +BS x4, soft, non distended  Musculoskeletal: no edema  Skin: intact no rashes or lesions, cyanotic   LABS:  BMET Recent Labs  Lab 06/10/17 1214 06/10/17 2309 06/11/17 0815 06/12/17 0643  NA 132* 144  --  140  K 5.7* 7.0* 3.9 4.5  CL 89* 98*  --  100*  CO2 33* 27  --  31  BUN 53* 46*  --  48*  CREATININE 0.78 0.60  --  0.71  GLUCOSE 172* 235*  --  175*     Electrolytes Recent Labs  Lab 06/10/17 1214 06/10/17 2309 06/11/17 1429 06/12/17 0643  CALCIUM 8.8* 8.5*  --  8.5*  MG 2.2  --  2.1 2.1  PHOS  --   --  2.9 3.0    CBC Recent Labs  Lab 06/10/17 1214 06/11/17 0432  WBC 70.3* 27.3*  HGB 13.4 12.1  HCT 44.7 38.3  PLT 222 139*    Coag's No results for input(s): APTT, INR in the last 168 hours.  Sepsis Markers Recent Labs  Lab 06/10/17 1214 06/10/17 1404 06/10/17 1649 06/10/17 1700 06/11/17 0432  LATICACIDVEN 2.5* 2.1*  --  2.5*  --   PROCALCITON  --   --  <0.10  --  0.16    ABG Recent Labs  Lab 06/11/17 0045 06/11/17 0458  PHART 7.27* 7.45  PCO2ART 70* 56*  PO2ART 163* 171*    Liver Enzymes Recent Labs  Lab 06/10/17 1214  AST 48*  ALT 84*  ALKPHOS 85  BILITOT 0.8  ALBUMIN 3.6    Cardiac Enzymes Recent Labs  Lab 06/10/17 1445 06/10/17 1903 06/10/17 2309  TROPONINI <0.03 <0.03 <0.03    Glucose Recent Labs  Lab 06/11/17 1349 06/11/17 1608 06/11/17 1934 06/11/17 2349 06/12/17 0342 06/12/17 0725  GLUCAP 111* 121* 164* 136* 165* 164*    Imaging  STUDIES:  CT Chest 02/25>>Extensive mediastinal, supraclavicular and upper abdominal adenopathy consistent with recurrent small cell lung cancer. There is extensive tumor throughout the left pleural space with a loculated large left pleural effusion and complete collapse of the left lung. No new osseous findings.  Left pleural paraspinal tumor.  CULTURES: Blood x2 02/25>> Sputum 02/25>>  ANTIBIOTICS: Cefepime 02/25>> Vancomycin 02/25>>  LINES/TUBES: ETT 02/26>>  ASSESSMENT / PLAN: 67 yo white female with extensive and end stage small cell lung cancer with severe hypoxic resp failure from complete lung collapse from lung cancer with loculated effusions  PULMONARY A: Acute on chronic hypoxic respiratory failure secondary to AECOPD, Loculated Large Left Pleural Effusion, and Small Cell Lung Carcinoma Mechanical Intubation  Hx:  Tobacco Abuse and Asthma  P:   Full vent support-vent settings reviewed and established VAP bundle implemented Scheduled and prn bronchodilator therapy  IV and nebulized steroids  Consider IR consult for palliative left thoracentesis 1.2 L removed  will consult Dr Genevive Bi for further assessment  CARDIOVASCULAR A:  Atrial Fibrillation with RVR  P:  Continuous telemetry monitoring  Maintain map >65  Cardiology consulted appreciate input   RENAL A:   Hyperkalemia  P:   Trend BMP  Replace electrolytes as indicated  Monitor UOP   GASTROINTESTINAL A:   No acute issues  P:   Start TF's Pepcid for SUP   HEMATOLOGIC A:   CLL with extreme leukocytosis  Recurrent Small Cell Lung Carcinoma with extensive mediastinal and supraclavicular adenopathy New CNS metastasis  Hx: Thrombocytopenia  P:  Oncology consulted appreciate input  Subq heparin for VTE prophylaxis  Trend CBC Monitor for s/sx of bleeding and transfuse for hgb <7  INFECTIOUS A:   Extreme Leukocytosis secondary to possible left sided empyema (recently treated for pneumonia 2  weeks ago) and CLL P:   Trend WBC and monitor fever curve  Trend PCT and lactic acid  Follow cultures Continue current abx  Will likely need left sided thoracentesis with cytology to r/o infection vs. malignancy   ENDOCRINE A:   Hyperglycemia    P:   CBG's q4hrs    NEUROLOGIC A:   Newly dx CNS Metastasis  Mechanical Intubation Discomfort/Pain  Hx: CVA  P:   RASS goal: 0 to -1 Fentanyl gtt to maintain RASS goal and for pain management     -Palliative Care consulted strongly recommend discussing goals of treatment and changing code status to DNR due to overall poor prognosis.   Critical Care Time devoted to patient care services described in this note is 45 minutes.   Overall, patient is critically ill, prognosis is guarded.  Patient with Multiorgan failure and at high risk for cardiac arrest and death.    Corrin Parker, M.D.  [facility]  Medical Director [place] Director [facility] Cardio-Pulmonary Department

## 2017-06-12 NOTE — Progress Notes (Signed)
After further assessment, patient has extensive recurrent small  Cell lung cancer with left lung collapse and destruction from tumor burden.   I have met with family to discuss prognosis, it seems that patient, even though she is on vent, she is able to communicate with family and she wants to have endotracheal tube removed.  I have explained to The family, as they have consented and agreed, is  to extubate patient at some point and to assess resp status and if she decompensates, we will proceed with comfort care measures. They all agree with this plan and satisfied with this plan. They are waiting for right time to perform extubation.   Family are satisfied with Plan of action and management. All questions answered  Corrin Parker, M.D.  [facility]  Medical Director [place] Director [facility] Cardio-Pulmonary Department

## 2017-06-12 NOTE — Progress Notes (Signed)
[facility] Cardiology [facility] Encounter Note  Patient: Sheri Ayala / Admit Date: 06/10/2017 / Date of Encounter: 06/12/2017, 8:12 AM   Subjective: Patient now alert and awake.  No evidence of apparent chest pain or congestive heart failure type issues.  Patient still has atrial fibrillation with rapid ventricular rate but tolerating well  Review of Systems: Cannot assess due to intubation  Objective: Telemetry: Atrial fibrillation rapid ventricular rate Physical Exam: Blood pressure 128/73, pulse (!) 121, temperature (!) 97.2 F (36.2 C), temperature source Axillary, resp. rate 20, height 5' 9" (1.753 m), weight 136 lb 0.4 oz (61.7 kg), SpO2 93 %. Body mass index is 20.09 kg/m. General: Well developed, well nourished, in no acute distress. Head: Normocephalic, atraumatic, sclera non-icteric, no xanthomas, nares are without discharge. Neck: No apparent masses Lungs: Normal respirations with use wheezes, some rhonchi, no rales , you crackles   Heart: Irregular rate and rhythm, normal S1 S2, no murmur, no rub, no gallop, PMI is normal size and placement, carotid upstroke normal without bruit, jugular venous pressure normal Abdomen: Soft, non-tender, non-distended with normoactive bowel sounds. No hepatosplenomegaly. Abdominal aorta is normal size without bruit Extremities: No edema, no clubbing, no cyanosis, no ulcers,  Peripheral: 2+ radial, 2+ femoral, 2+ dorsal pedal pulses Neuro: Alert and oriented. Moves all extremities spontaneously. Psych:  Responds to questions appropriately with a normal affect.   Intake/Output Summary (Last 24 hours) at 06/12/2017 0812 Last data filed at 06/12/2017 0600 Gross per 24 hour  Intake 1139.33 ml  Output 575 ml  Net 564.33 ml    Inpatient Medications:  . budesonide (PULMICORT) nebulizer solution  0.5 mg Nebulization BID  . chlorhexidine gluconate (MEDLINE KIT)  15 mL Mouth Rinse BID  . famotidine  20 mg Per Tube BID  . feeding supplement  (VITAL 1.5 CAL)  1,000 mL Per Tube Q24H  . heparin  5,000 Units Subcutaneous Q8H  . insulin aspart  0-9 Units Subcutaneous Q4H  . ipratropium-albuterol  3 mL Nebulization Q4H  . mouth rinse  15 mL Mouth Rinse 10 times per day  . methylPREDNISolone (SOLU-MEDROL) injection  40 mg Intravenous Q12H  . metoprolol tartrate  50 mg Per Tube BID  . multivitamin  15 mL Per Tube Daily  . sertraline  25 mg Per Tube QHS  . topiramate  50 mg Per Tube Daily   Infusions:  . ceFEPime (MAXIPIME) IV 2 g (06/11/17 2159)  . fentaNYL infusion INTRAVENOUS 50 mcg/hr (06/12/17 0600)    Labs: Recent Labs    06/10/17 2309 06/11/17 0815 06/11/17 1429 06/12/17 0643  NA 144  --   --  140  K 7.0* 3.9  --  4.5  CL 98*  --   --  100*  CO2 27  --   --  31  GLUCOSE 235*  --   --  175*  BUN 46*  --   --  48*  CREATININE 0.60  --   --  0.71  CALCIUM 8.5*  --   --  8.5*  MG  --   --  2.1 2.1  PHOS  --   --  2.9 3.0   Recent Labs    06/10/17 1214  AST 48*  ALT 84*  ALKPHOS 85  BILITOT 0.8  PROT 6.4*  ALBUMIN 3.6   Recent Labs    06/10/17 1214 06/11/17 0432  WBC 70.3* 27.3*  NEUTROABS 22.5*  --   HGB 13.4 12.1  HCT 44.7 38.3  MCV 86.6 85.5  PLT 222 139*   Recent Labs    06/10/17 1445 06/10/17 1903 06/10/17 2309  TROPONINI <0.03 <0.03 <0.03   Invalid input(s): POCBNP No results for input(s): HGBA1C in the last 72 hours.   Weights: Filed Weights   06/10/17 1224 06/11/17 2000 06/12/17 0401  Weight: 120 lb (54.4 kg) 136 lb 7.4 oz (61.9 kg) 136 lb 0.4 oz (61.7 kg)     Radiology/Studies:  Dg Chest 2 View  Result Date: 06/10/2017 CLINICAL DATA:  Shortness of breath. History of COPD and lung cancer. History of brain cancer scheduled to start chemotherapy today. EXAM: CHEST  2 VIEW COMPARISON:  Chest x-ray dated 05/24/2017. FINDINGS: Complete opacification of the left chest, presumably large pleural effusion. Associated slight rightward shift of the cardiomediastinal structures. Right lung  is clear. No acute or suspicious osseous finding. IMPRESSION: New complete opacification of the left hemithorax, presumably due to large pleural effusion. Electronically Signed   By: Franki Cabot M.D.   On: 06/10/2017 13:36   Dg Chest 2 View  Result Date: 05/24/2017 CLINICAL DATA:  Shortness of Breath EXAM: CHEST  2 VIEW COMPARISON:  04/20/2017 FINDINGS: Cardiac shadow is stable. The right lung is clear. Diffuse left lower lobe infiltrate is noted consistent with pneumonia. No sizable effusion is seen. No bony abnormality is noted. IMPRESSION: Diffuse left lower lobe infiltrate. Electronically Signed   By: Inez Catalina M.D.   On: 05/24/2017 07:20   Dg Abd 1 View  Result Date: 06/11/2017 CLINICAL DATA:  OG tube placement EXAM: ABDOMEN - 1 VIEW COMPARISON:  07/21/2016 FINDINGS: OG tube tip is in the distal esophagus near the GE junction and could be advanced several cm. IMPRESSION: OG tube tip in the distal esophagus near the GE junction. Electronically Signed   By: Rolm Baptise M.D.   On: 06/11/2017 00:35   Ct Head W Or Wo Contrast  Result Date: 05/24/2017 CLINICAL DATA:  67 year old female with 1 week of severe headache. No known injury. 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 EXAM: CT HEAD WITHOUT AND WITH CONTRAST TECHNIQUE: Contiguous axial images were obtained from the base of the skull through the vertex without and with intravenous contrast CONTRAST:  53m ISOVUE-300 IOPAMIDOL (ISOVUE-300) INJECTION 61% COMPARISON:  05/02/2017 head CT, brain MRI 07/24/2016 and earlier. FINDINGS: Brain: Stable cerebral volume. No midline shift, ventriculomegaly, mass effect, intracranial hemorrhage or evidence of cortically based acute infarction. No abnormal enhancement identified. Asymmetric hypodensity in the left cerebellum is new since the head CT 09/29/2015, and not correlated on the 2018 brain MRI. This is increased in conspicuity since January. There is no associated mass effect or enhancement.  The caudal aspect of this area has a curvilinear morphology raising the possibility of encephalomalacia (series 2, image 5). Elsewhere stable gray-white matter differentiation throughout the brain, with chronic nonspecific cerebral white matter hypodensity. Vascular: Mild Calcified atherosclerosis at the skull base. Major intracranial vascular structures are enhancing. Skull: Bone mineralization remains stable. No acute or suspicious osseous lesion identified. Sinuses/Orbits: Visualized paranasal sinuses and mastoids are stable and well pneumatized. Other: Visualized orbits and scalp soft tissues are within normal limits. IMPRESSION: 1. Nonspecific hypodense area in the left cerebellum has developed since 2018. No associated enhancement or mass effect to strongly suggest metastatic disease. This might be a late subacute cerebellar infarct. Recommend Brain MRI without and with contrast to further characterize. 2. No other acute finding. Electronically Signed   By: HGenevie AnnM.D.   On: 05/24/2017 08:23   Ct  Chest W Contrast  Result Date: 06/10/2017 CLINICAL DATA:  Metastatic brain lesion. Recent pneumonia. History of COPD and small cell lung cancer. Progressive weakness, cough and shortness of breath. EXAM: CT CHEST WITH CONTRAST TECHNIQUE: Multidetector CT imaging of the chest was performed during intravenous contrast administration. CONTRAST:  39m ISOVUE-300 IOPAMIDOL (ISOVUE-300) INJECTION 61% COMPARISON:  Radiographs 06/10/2017 and 05/24/2017.  CT 02/19/2017. FINDINGS: Cardiovascular: Mild atherosclerosis of the aorta, great vessels and coronary arteries. There is extrinsic mass effect on the SVC, right internal jugular vein and pulmonary arteries by extensive mediastinal lymphadenopathy, but no evidence of large vessel occlusion or acute pulmonary embolism. The heart size is normal. There is no pericardial effusion. Mediastinum/Nodes: Patient has developed extensive mediastinal and supraclavicular adenopathy.  Representative nodes include a 19 mm right supraclavicular node (image 8/series 2), a 17 mm left supraclavicular node (image 12/2), a 4.3 x 3.5 cm right paratracheal node (image 54) and a 3.2 x 8.2 cm subcarinal node (image 73). There is no axillary adenopathy. The thyroid gland, trachea and esophagus demonstrate no significant findings. Lungs/Pleura: Interval development of a large complex left pleural effusion with multiple loculations and pleural based nodules. As shown on recent radiographs, the left lung and central bronchi are completely collapsed. The right lung remains well aerated. There are right lung emphysematous changes and mild right apical scarring. No suspicious right lung nodules. Upper abdomen: Stable low-density right adrenal nodule consistent with a benign adenoma based on stability. Developing right retrocrural and left celiac adenopathy. Musculoskeletal/Chest wall: There is no chest wall mass or suspicious osseous finding. There is paraspinal tumor in the left pleural space without definite neural foraminal or intraspinal extension. IMPRESSION: 1. Extensive mediastinal, supraclavicular and upper abdominal adenopathy consistent with recurrent small cell lung cancer. 2. There is extensive tumor throughout the left pleural space with a loculated large left pleural effusion and complete collapse of the left lung. 3. No new osseous findings.  Left pleural paraspinal tumor. Electronically Signed   By: WRichardean SaleM.D.   On: 06/10/2017 15:17   Mr BJeri CosWMMContrast  Result Date: 05/24/2017 CLINICAL DATA:  Acute headache.  History of lung cancer. EXAM: MRI HEAD WITHOUT AND WITH CONTRAST TECHNIQUE: Multiplanar, multiecho pulse sequences of the brain and surrounding structures were obtained without and with intravenous contrast. CONTRAST:  166mMULTIHANCE GADOBENATE DIMEGLUMINE 529 MG/ML IV SOLN COMPARISON:  Head CT from earlier today.  Brain MRI 07/24/2016 FINDINGS: Brain: The left inferior  cerebellar abnormality reflects an enhancing superficial mass measuring up to 17 mm. The mass splays folia but is still likely intra-axial. No second lesion is seen. No infarct, hemorrhage, hydrocephalus, or collection. Chronic white matter disease, usually microvascular ischemic. Vascular: 3 mm enhancing outpouching extending inferiorly from the right MCA bifurcation. Skull and upper cervical spine: No noted metastatic lesion. Sinuses/Orbits: No visible marrow lesion. Other: These results were called by telephone at the time of interpretation on 05/24/2017 at 10:46 am to Dr. VAVaughan Basta who verbally acknowledged these results. IMPRESSION: 1. 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 2. 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 At follow-up brain MRI a MRA is recommended. Electronically Signed   By: JoMonte Fantasia.D.   On: 05/24/2017 11:11   Dg Chest Port 1 View  Result Date: 06/11/2017 CLINICAL DATA:  Status post thoracentesis EXAM: PORTABLE CHEST 1 VIEW COMPARISON:  Study obtained earlier in the day FINDINGS: Endotracheal tube tip is 5.1 cm above the carina. Nasogastric tube tip and  side port are below the diaphragm. No pneumothorax. There remains diffuse opacification of the left hemithorax. Right lung is clear. The heart size appears within normal limits. Pulmonary vascularity on the right appears normal. Pulmonary vascularity on the left is obscured. No adenopathy appreciable in visualized regions. There is midthoracic dextroscoliosis. IMPRESSION: Tube positions as described without pneumothorax. Right lung clear. Diffuse opacification of the left hemithorax remains. Question a degree of residual effusion versus consolidation. There may also be a degree of mucous plugging or obstructing lesion in the left main bronchus. This combination of findings may warrant chest CT, ideally with intravenous contrast, for further delineation. Electronically Signed   By:  Lowella Grip III M.D.   On: 06/11/2017 12:16   Dg Chest Port 1 View  Result Date: 06/11/2017 CLINICAL DATA:  67 year old female with respiratory failure. EXAM: PORTABLE CHEST 1 VIEW COMPARISON:  Chest CT dated 06/10/2017 FINDINGS: There has been interval placement of an endotracheal tube with tip approximately 4.3 cm above the carina. An enteric tube extends into the left upper abdomen with tip in the proximal stomach. There is complete opacification of the left hemithorax. An area of hazy density in the right upper lung field adjacent to the overlying support tube noted. There is no pneumothorax. The cardiac borders are silhouetted. No acute osseous pathology. IMPRESSION: 1. Endotracheal tube above the carina and enteric tube in the proximal stomach. 2. Complete opacification of the left hemithorax. Electronically Signed   By: Anner Crete M.D.   On: 06/11/2017 00:38   Dg Abd Portable 1v  Result Date: 06/11/2017 CLINICAL DATA:  Orogastric tube placement. EXAM: PORTABLE ABDOMEN - 1 VIEW COMPARISON:  Study obtained earlier in the day FINDINGS: Orogastric tube tip and side port in the distal stomach. Visualized bowel gas pattern is unremarkable. No obstruction or free air. Diffuse consolidation left lung noted. IMPRESSION: Orogastric tube tip and side port in stomach. Visualized bowel gas pattern unremarkable. Electronically Signed   By: Lowella Grip III M.D.   On: 06/11/2017 12:17   US Thoracentesis Asp Pleural Space W/img Guide  Result Date: 06/11/2017 INDICATION: Metastatic lung cancer. Respiratory failure. Left-sided pleural effusion. Request for diagnostic and therapeutic thoracentesis. EXAM: ULTRASOUND GUIDED LEFT THORACENTESIS MEDICATIONS: N None one. COMPLICATIONS: None immediate. Postprocedural chest x-ray negative for pneumothorax. PROCEDURE: An ultrasound guided thoracentesis was thoroughly discussed with the patient and questions answered. The benefits, risks, alternatives and  complications were also discussed. The patient understands and wishes to proceed with the procedure. Written consent was obtained. Ultrasound was performed to localize and mark an adequate pocket of fluid in the left chest. The area was then prepped and draped in the normal sterile fashion. 1% Lidocaine was used for local anesthesia. Under ultrasound guidance a Safe-T-Centesis catheter was introduced. Thoracentesis was performed. The catheter was removed and a dressing applied. FINDINGS: A total of approximately 1.2 L of hazy, amber colored fluid was removed. Samples were sent to the laboratory as requested by the clinical team. IMPRESSION: Successful ultrasound guided left thoracentesis yielding 1.2 L of pleural fluid. Electronically Signed   By: Markus Daft M.D.   On: 06/11/2017 12:33     Assessment and Recommendation  67 y.o. female with known acute on chronic obstructive pulmonary disease with significant infection hypoxia now slightly improved at this time with new onset atrial fibrillation with rapid ventricular rate likely secondary to above and no current evidence of myocardial infarction or congestive heart failure 1.  Continue metoprolol for heart rate control and increase  dose to 100 mg twice per day 2.  To coagulation for further risk reduction and stroke with atrial fibrillation 3.  Continue supportive care of acute on chronic obstructive pulmonary disease and infection likely primary cause of above  Signed, Serafina Royals M.D. FACC

## 2017-06-12 NOTE — Progress Notes (Signed)
   06/12/17 1120  Clinical Encounter Type  Visited With Family  Visit Type Follow-up  Spiritual Encounters  Spiritual Needs Emotional   Chaplain checked in on patient and family.  Chaplain spoke to patient's son, appeared emotional, said that family is on the way and that they need to have some difficult conversations.  Son asked chaplain to come back later.

## 2017-06-12 NOTE — Plan of Care (Signed)
Patient is still able to nod/gesture appropriately and follow commands. No complaint of pain and q2 turn perform for patient. Uneventful night.

## 2017-06-12 NOTE — Progress Notes (Signed)
   06/12/17 1150  Clinical Encounter Type  Visited With Patient and family together  Visit Type Follow-up  Spiritual Encounters  Spiritual Needs Emotional;Prayer   Chaplain received page to check on family.  Chaplain encountered son in hallway while chaplain was responding to page.  Son asked chaplain to check on his wife and the patient.  Chaplain offered emotional support to son's spouse who was tearful.  Conversation around relationship between patient and her daughter-in-law, review of patient's recent health issues.  Chaplain prayed with patient and daughter-in-law.

## 2017-06-12 NOTE — Progress Notes (Signed)
   06/12/17 1535  Clinical Encounter Type  Visited With Family  Visit Type Follow-up  Spiritual Encounters  Spiritual Needs Emotional   Chaplain encountered patient's twin sister and daughter-in-law in the hallway.  Conversation around family decisions regarding patient.  Chaplain to check in with family tomorrow.

## 2017-06-12 NOTE — Progress Notes (Signed)
   06/12/17 1335  Clinical Encounter Type  Visited With Patient and family together;Health care provider  Visit Type Follow-up  Spiritual Encounters  Spiritual Needs Emotional;Prayer   Chaplain met with patient and family.  Conversation around patient's health and what they have learned today.  Chaplain remained present with family during meeting with unit physician and palliative care team member.  Chaplain maintained pastoral presence and offered emotional support.  After health care team members left, family asked chaplain to pray with them.  Chaplain led prayer for discernment and support.  Family shared thoughts and feelings regarding information they now have and how they may proceed.

## 2017-06-12 NOTE — Progress Notes (Signed)
Patient ID: Sheri Ayala, female   DOB: [birth year], 67 y.o.   MRN: [id number]  Chief Complaint  Patient presents with  . Shortness of Breath    Referred By Dr. Maryfrances Bunnell Reason for Referral left lung atelectasis  HPI Location, Quality, Duration, Severity, Timing, Context, Modifying Factors, Associated Signs and Symptoms.  Sheri Ayala is a 67 y.o. female.  She carries a diagnosis of small cell carcinoma and has been treated in the past year with radiation therapy and chemotherapy.  Recently she was found to have a brain lesion in and was scheduled to begin radiation therapy.  However she became increasingly short of breath and was brought to the emergency department where ultimately she underwent intubation.  X-ray showed complete opacification of the left hemithorax and a subsequent CT scan and ultrasound guided thoracentesis were performed.  The CT scan showed collapse of the left mainstem bronchus with atelectasis of the entire left lung.  There was evidence of pleural disease with enhancing pleural nodules.  The patient underwent an ultrasound-guided thoracentesis with no significant change in the overall x-ray findings.  She currently remains intubated and there is a discussion today regarding end of life issues and treatment options.  I did have an opportunity to meet with her significant other.  I also met with her son and daughter-in-law.  I explained to them that her outcome was poor and that there were no surgical options available.   Past Medical History:  Diagnosis Date  . Allergy   . 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 Anxiety   . Asthma   . Atrial fibrillation ([place]) 04/2016  . Brain metastases ([place]) 2019  . Bronchospasm   . Cerebrovascular accident (CVA) ([place]) 08/28/2015  . Chronic lymphocytic leukemia ([place]) 2004  . COPD (chronic obstructive pulmonary disease) ([place])   . Depression   . Emphysema of lung ([place])   . Frontal sinusitis November 2012  .  H/O viral encephalitis   . Headache(784.0)   . Heart murmur   . Mitral prolapse 11/24/2015  . Mitral regurgitation 11/24/2015  . Olecranon bursitis of left elbow November 2012.   in past  . Pleurisy   . Pneumonia    13 times  . Rhinitis   . Small cell lung cancer ([place])   . Thrombocytopenia ([place])   . Tobacco abuse   . Tricuspid valve regurgitation 11/24/2015    Past Surgical History:  Procedure Laterality Date  . ABDOMINAL HYSTERECTOMY    . COLONOSCOPY    . UPPER GASTROINTESTINAL ENDOSCOPY    . VIDEO BRONCHOSCOPY Bilateral 04/18/2016   Procedure: VIDEO BRONCHOSCOPY WITHOUT FLUORO;  Surgeon: Tanda Rockers, MD;  Location: [facility];  Service: Cardiopulmonary;  Laterality: Bilateral;  . VIDEO BRONCHOSCOPY WITH ENDOBRONCHIAL ULTRASOUND N/A 07/23/2016   Procedure: VIDEO BRONCHOSCOPY WITH ENDOBRONCHIAL ULTRASOUND;  Surgeon: Grace Isaac, MD;  [facility]: [facility];  Service: Thoracic;  Laterality: N/A;    Family History  Problem Relation Age of Onset  . Aneurysm Mother   . Liver disease Father   . Congestive Heart Failure Brother   . Heart disease Brother   . Colon cancer Neg Hx   . Esophageal cancer Neg Hx   . Pancreatic cancer Neg Hx   . Rectal cancer Neg Hx   . Stomach cancer Neg Hx     Social History Social History   Tobacco Use  . Smoking status: Former Smoker    Packs/day: 0.00  Years: 45.00    Pack years: 0.00  . Smokeless tobacco: Never Used  Substance Use Topics  . Alcohol use: No    Alcohol/week: 0.0 oz  . Drug use: No    Allergies  Allergen Reactions  . Eliquis [Apixaban] Rash    Hives, and difficulty swallowing  . Cardizem Cd [Diltiazem Hcl Er Beads] Rash  . Citrus Other (See Comments)    Burning in stomach   . Penicillins Hives, Nausea And Vomiting, Swelling and Other (See Comments)    Patient reports tolerating ampicillin.  Has patient had a PCN reaction causing immediate rash, facial/tongue/throat swelling, SOB or lightheadedness with  hypotension: Yes Has patient had a PCN reaction causing severe rash involving mucus membranes or skin necrosis: unknown Has patient had a PCN reaction that required hospitalization No Has patient had a PCN reaction occurring within the last 10 years: No If all of the above answers are "NO", then may proceed with Cephalosporin use.     Current Facility-Administered Medications  Medication Dose Route Frequency Provider Last Rate Last Dose  . budesonide (PULMICORT) nebulizer solution 0.5 mg  0.5 mg Nebulization BID Flora Lipps, MD   0.5 mg at 06/12/17 0752  . ceFEPIme (MAXIPIME) 2 g in sodium chloride 0.9 % 100 mL IVPB  2 g Intravenous Q12H Lenis Noon, [facility]   Stopped at 06/12/17 1111  . chlorhexidine gluconate (MEDLINE KIT) (PERIDEX) 0.12 % solution 15 mL  15 mL Mouth Rinse BID Awilda Bill, NP   15 mL at 06/12/17 0823  . docusate (COLACE) 50 MG/5ML liquid 100 mg  100 mg Per Tube BID PRN Awilda Bill, NP      . famotidine (PEPCID) tablet 20 mg  20 mg Per Tube BID Flora Lipps, MD   20 mg at 06/12/17 1040  . feeding supplement (VITAL 1.5 CAL) liquid 1,000 mL  1,000 mL Per Tube Q24H Flora Lipps, MD 40 mL/hr at 06/12/17 0600 1,000 mL at 06/12/17 0600  . fentaNYL (SUBLIMAZE) bolus via infusion 25 mcg  25 mcg Intravenous Q1H PRN Awilda Bill, NP   25 mcg at 06/12/17 0844  . fentaNYL 2598mg in NS 2541m(1074mml) infusion-PREMIX  25-400 mcg/hr Intravenous Continuous BlaAwilda BillP 7.5 mL/hr at 06/12/17 0844 75 mcg/hr at 06/12/17 0844  . heparin injection 5,000 Units  5,000 Units Subcutaneous Q8H VacVaughan BastaD   5,000 Units at 06/11/17 1350  . insulin aspart (novoLOG) injection 0-9 Units  0-9 Units Subcutaneous Q4H BlaAwilda BillP   2 Units at 06/12/17 [phone] ipratropium-albuterol (DUONEB) 0.5-2.5 (3) MG/3ML nebulizer solution 3 mL  3 mL Nebulization Q4H KasFlora LippsD   3 mL at 06/12/17 1156  . levalbuterol (XOPENEX) nebulizer solution 0.63 mg  0.63 mg  Nebulization Q6H PRN BlaAwilda BillP      . LORazepam (ATIVAN) injection 1-2 mg  1-2 mg Intravenous Q2H PRN KasFlora LippsD   1 mg at 06/12/17 0223  . MEDLINE mouth rinse  15 mL Mouth Rinse 10 times per day BlaAwilda BillP   15 mL at 06/12/17 1041  . methylPREDNISolone sodium succinate (SOLU-MEDROL) 40 mg/mL injection 40 mg  40 mg Intravenous Q12H BlaAwilda BillP   40 mg at 06/11/17 2354  . metoprolol tartrate (LOPRESSOR) injection 2.5-5 mg  2.5-5 mg Intravenous Q6H PRN BlaAwilda BillP   5 mg at 06/12/17 [id number] metoprolol tartrate (LOPRESSOR) tablet 50 mg  50 mg Per  Tube BID Flora Lipps, MD   50 mg at 06/12/17 1040  . multivitamin liquid 15 mL  15 mL Per Tube Daily Flora Lipps, MD   15 mL at 06/12/17 1040  . sertraline (ZOLOFT) tablet 25 mg  25 mg Per Tube QHS Flora Lipps, MD   25 mg at 06/11/17 2152  . topiramate (TOPAMAX) tablet 50 mg  50 mg Per Tube Daily Awilda Bill, NP   50 mg at 06/12/17 1041      Review of Systems A complete review of systems was asked and was negative except for the following positive findings unobtainable because the patient is currently intubated.  Blood pressure 126/72, pulse (!) 132, temperature 99.8 F (37.7 C), temperature source Oral, resp. rate (!) 30, height '5\' 9"'$  (1.753 m), weight 136 lb 0.4 oz (61.7 kg), SpO2 93 %.  Physical Exam CONSTITUTIONAL: Intubated but in no acute distress. EYES: Pupils equal and reactive to light, Sclera non-icteric EARS, NOSE, MOUTH AND THROAT:  The oropharynx was clear.  Dentition is good repair.  Oral mucosa pink and moist. LYMPH NODES:  Lymph nodes in the neck and axillae were normal RESPIRATORY:  Lungs were absent on the left and rhonchorous on the right.   CARDIOVASCULAR: Heart was regular without murmurs.  There were no carotid bruits. GI: The abdomen was soft, nontender, and nondistended. There were no palpable masses. There was no hepatosplenomegaly. There were normal bowel sounds in all  quadrants. GU:  Rectal deferred.    Data Reviewed CT scans and chest x-rays  I have personally reviewed the patient's imaging, laboratory findings and medical records.    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  This has resulted in complete atelectasis of the left lung with a malignant pleural effusion.    Plan    I believe that her pleural effusion is most likely related to complete atelectasis and not the cause of her atelectasis.  There was no change in the chest x-ray after she underwent a thoracentesis.  In addition the CT shows a large mass behind the left mainstem bronchus.  Given her evidence of intracranial metastases and her prior radiation therapy and chemotherapy I suspect there is little that can be done surgically to relieve the obstruction.  Oncology is seeing the patient as well and there is a family meeting scheduled for later today to discuss end-of-life issues and the patient's desire for continued treatment.  At the present time I have nothing further to add in her overall management plan.  I appreciate the opportunity to see her.       Nestor Lewandowsky, MD 06/12/2017, 1:10 PM

## 2017-06-13 ENCOUNTER — Ambulatory Visit: Payer: Medicare HMO

## 2017-06-13 DIAGNOSIS — Y92009 Unspecified place in unspecified non-institutional (private) residence as the place of occurrence of the external cause: Secondary | ICD-10-CM

## 2017-06-13 DIAGNOSIS — W19XXXA Unspecified fall, initial encounter: Secondary | ICD-10-CM

## 2017-06-13 LAB — CULTURE, RESPIRATORY: CULTURE: NORMAL

## 2017-06-13 LAB — GLUCOSE, CAPILLARY
GLUCOSE-CAPILLARY: 184 mg/dL — AB (ref 65–99)
GLUCOSE-CAPILLARY: 146 mg/dL — AB (ref 65–99)
GLUCOSE-CAPILLARY: 115 mg/dL — AB (ref 65–99)
GLUCOSE-CAPILLARY: 123 mg/dL — AB (ref 65–99)
Glucose-Capillary: 114 mg/dL — ABNORMAL HIGH (ref 65–99)

## 2017-06-13 MED ORDER — DILTIAZEM LOAD VIA INFUSION
15.0000 mg | Freq: Once | INTRAVENOUS | Status: DC
Start: 2017-06-13 — End: 2017-06-13
  Filled 2017-06-13: qty 15

## 2017-06-13 MED ORDER — LORAZEPAM 2 MG/ML IJ SOLN
1.0000 mg | INTRAMUSCULAR | Status: DC | PRN
  Administered 2017-06-13 – 2017-06-14 (×2): 1 mg via INTRAVENOUS
  Filled 2017-06-13 (×3): qty 1

## 2017-06-13 MED ORDER — FENTANYL BOLUS VIA INFUSION
25.0000 ug | INTRAVENOUS | Status: DC | PRN
  Filled 2017-06-13: qty 25

## 2017-06-13 MED ORDER — FENTANYL CITRATE (PF) 100 MCG/2ML IJ SOLN: 25.0000 ug | INTRAMUSCULAR | Status: DC | PRN

## 2017-06-13 MED ORDER — DILTIAZEM HCL 25 MG/5ML IV SOLN
15.0000 mg | Freq: Once | INTRAVENOUS | Status: AC
  Administered 2017-06-13: 15 mg via INTRAVENOUS
  Filled 2017-06-13: qty 5

## 2017-06-13 MED ORDER — DILTIAZEM HCL 100 MG IV SOLR
5.0000 mg/h | INTRAVENOUS | Status: DC
  Administered 2017-06-13: 12 mg/h via INTRAVENOUS
  Administered 2017-06-14: 5 mg/h via INTRAVENOUS
  Filled 2017-06-13 (×2): qty 100

## 2017-06-13 MED ORDER — MORPHINE SULFATE (PF) 2 MG/ML IV SOLN
1.0000 mg | INTRAVENOUS | Status: DC | PRN
  Administered 2017-06-14 (×3): 2 mg via INTRAVENOUS
  Filled 2017-06-13 (×3): qty 1

## 2017-06-13 MED ORDER — GLYCOPYRROLATE 0.2 MG/ML IJ SOLN: 0.1000 mg | INTRAMUSCULAR | Status: DC | PRN

## 2017-06-13 MED ORDER — GLYCOPYRROLATE 0.2 MG/ML IJ SOLN
0.2000 mg | Freq: Once | INTRAMUSCULAR | Status: AC
  Administered 2017-06-13: 0.2 mg via INTRAVENOUS
  Filled 2017-06-13: qty 1

## 2017-06-13 MED ORDER — METOPROLOL TARTRATE 50 MG PO TABS
100.0000 mg | ORAL_TABLET | Freq: Two times a day (BID) | ORAL | Status: DC
  Administered 2017-06-13 – 2017-06-14 (×3): 100 mg via ORAL
  Filled 2017-06-13 (×3): qty 2

## 2017-06-13 NOTE — Progress Notes (Signed)
[facility] Encounter Note  Patient: Sheri Ayala / Admit Date: 06/10/2017 / Date of Encounter: 06/13/2017, 8:13 AM   Subjective: Patient with no significant change in status.  No evidence of apparent chest pain or congestive heart failure type issues throughout last night.  Patient still has atrial fibrillation with rapid ventricular rate but tolerating well  Review of Systems: Cannot assess due to intubation  Objective: Telemetry: Atrial fibrillation rapid ventricular rate Physical Exam: Blood pressure 101/65, pulse (!) 120, temperature 98.3 F (36.8 C), resp. rate 16, height _0  (1.753 m), weight 129 lb 13.6 oz (58.9 kg), SpO2 92 %. Body mass index is 19.18 kg/m. General: Well developed, well nourished, in no acute distress. Head: Normocephalic, atraumatic, sclera non-icteric, no xanthomas, nares are without discharge. Neck: No apparent masses Lungs: Normal respirations with use wheezes, some rhonchi, no rales , you crackles   Heart: Irregular rate and rhythm, normal S1 S2, no murmur, no rub, no gallop, PMI is normal size and placement, carotid upstroke normal without bruit, jugular venous pressure normal Abdomen: Soft, non-tender, non-distended with normoactive bowel sounds. No hepatosplenomegaly. Abdominal aorta is normal size without bruit Extremities: No edema, no clubbing, no cyanosis, no ulcers,  Peripheral: 2+ radial, 2+ femoral, 2+ dorsal pedal pulses Neuro: Alert and oriented. Moves all extremities spontaneously. Psych:  Responds to questions appropriately with a normal affect.   Intake/Output Summary (Last 24 hours) at 06/13/2017 0813 Last data filed at 06/13/2017 0600 Gross per 24 hour  Intake 1200.92 ml  Output 700 ml  Net 500.92 ml    Inpatient Medications:  . budesonide (PULMICORT) nebulizer solution  0.5 mg Nebulization BID  . chlorhexidine gluconate (MEDLINE KIT)  15 mL Mouth Rinse BID  . famotidine  20 mg Per Tube BID  . feeding  supplement (VITAL 1.5 CAL)  1,000 mL Per Tube Q24H  . heparin  5,000 Units Subcutaneous Q8H  . insulin aspart  0-9 Units Subcutaneous Q4H  . ipratropium-albuterol  3 mL Nebulization Q4H  . mouth rinse  15 mL Mouth Rinse 10 times per day  . methylPREDNISolone (SOLU-MEDROL) injection  40 mg Intravenous Q12H  . metoprolol tartrate  100 mg Oral BID  . multivitamin  15 mL Per Tube Daily  . sertraline  25 mg Per Tube QHS  . topiramate  50 mg Per Tube Daily   Infusions:  . ceFEPime (MAXIPIME) IV Stopped (06/12/17 2135)  . fentaNYL infusion INTRAVENOUS 75 mcg/hr (06/13/17 0600)    Labs: Recent Labs    06/10/17 2309 06/11/17 0815 06/11/17 1429 06/12/17 0643  NA 144  --   --  140  K 7.0* 3.9  --  4.5  CL 98*  --   --  100*  CO2 27  --   --  31  GLUCOSE 235*  --   --  175*  BUN 46*  --   --  48*  CREATININE 0.60  --   --  0.71  CALCIUM 8.5*  --   --  8.5*  MG  --   --  2.1 2.1  PHOS  --   --  2.9 3.0   Recent Labs    06/10/17 1214  AST 48*  ALT 84*  ALKPHOS 85  BILITOT 0.8  PROT 6.4*  ALBUMIN 3.6   Recent Labs    06/10/17 1214 06/11/17 0432 06/12/17 0643  WBC 70.3* 27.3* 38.3*  NEUTROABS 22.5*  --   --   HGB 13.4 12.1 12.7  HCT  44.7 38.3 41.2  MCV 86.6 85.5 85.6  PLT 222 139* 136*   Recent Labs    06/10/17 1445 06/10/17 1903 06/10/17 2309  TROPONINI <0.03 <0.03 <0.03   Invalid input(s): POCBNP No results for input(s): HGBA1C in the last 72 hours.   Weights: Filed Weights   06/11/17 2000 06/12/17 0401 06/13/17 0500  Weight: 136 lb 7.4 oz (61.9 kg) 136 lb 0.4 oz (61.7 kg) 129 lb 13.6 oz (58.9 kg)     Radiology/Studies:  Dg Chest 2 View  Result Date: 06/10/2017 CLINICAL DATA:  Shortness of breath. History of COPD and lung cancer. History of brain cancer scheduled to start chemotherapy today. EXAM: CHEST  2 VIEW COMPARISON:  Chest x-ray dated 05/24/2017. FINDINGS: Complete opacification of the left chest, presumably large pleural effusion. Associated  slight rightward shift of the cardiomediastinal structures. Right lung is clear. No acute or suspicious osseous finding. IMPRESSION: New complete opacification of the left hemithorax, presumably due to large pleural effusion. Electronically Signed   By: Franki Cabot M.D.   On: 06/10/2017 13:36   Dg Chest 2 View  Result Date: 05/24/2017 CLINICAL DATA:  Shortness of Breath EXAM: CHEST  2 VIEW COMPARISON:  04/20/2017 FINDINGS: Cardiac shadow is stable. The right lung is clear. Diffuse left lower lobe infiltrate is noted consistent with pneumonia. No sizable effusion is seen. No bony abnormality is noted. IMPRESSION: Diffuse left lower lobe infiltrate. Electronically Signed   By: Inez Catalina M.D.   On: 05/24/2017 07:20   Dg Abd 1 View  Result Date: 06/11/2017 CLINICAL DATA:  OG tube placement EXAM: ABDOMEN - 1 VIEW COMPARISON:  07/21/2016 FINDINGS: OG tube tip is in the distal esophagus near the GE junction and could be advanced several cm. IMPRESSION: OG tube tip in the distal esophagus near the GE junction. Electronically Signed   By: Rolm Baptise M.D.   On: 06/11/2017 00:35   Ct Head W Or Wo Contrast  Result Date: 05/24/2017 CLINICAL DATA:  67 year old female with 1 week of severe headache. No known injury. 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 EXAM: CT HEAD WITHOUT AND WITH CONTRAST TECHNIQUE: Contiguous axial images were obtained from the base of the skull through the vertex without and with intravenous contrast CONTRAST:  53m ISOVUE-300 IOPAMIDOL (ISOVUE-300) INJECTION 61% COMPARISON:  05/02/2017 head CT, brain MRI 07/24/2016 and earlier. FINDINGS: Brain: Stable cerebral volume. No midline shift, ventriculomegaly, mass effect, intracranial hemorrhage or evidence of cortically based acute infarction. No abnormal enhancement identified. Asymmetric hypodensity in the left cerebellum is new since the head CT 09/29/2015, and not correlated on the 2018 brain MRI. This is increased in  conspicuity since January. There is no associated mass effect or enhancement. The caudal aspect of this area has a curvilinear morphology raising the possibility of encephalomalacia (series 2, image 5). Elsewhere stable gray-white matter differentiation throughout the brain, with chronic nonspecific cerebral white matter hypodensity. Vascular: Mild Calcified atherosclerosis at the skull base. Major intracranial vascular structures are enhancing. Skull: Bone mineralization remains stable. No acute or suspicious osseous lesion identified. Sinuses/Orbits: Visualized paranasal sinuses and mastoids are stable and well pneumatized. Other: Visualized orbits and scalp soft tissues are within normal limits. IMPRESSION: 1. Nonspecific hypodense area in the left cerebellum has developed since 2018. No associated enhancement or mass effect to strongly suggest metastatic disease. This might be a late subacute cerebellar infarct. Recommend Brain MRI without and with contrast to further characterize. 2. No other acute finding. Electronically Signed   By:  Genevie Ann M.D.   On: 05/24/2017 08:23   Ct Chest W Contrast  Result Date: 06/10/2017 CLINICAL DATA:  Metastatic brain lesion. Recent pneumonia. History of COPD and small cell lung cancer. Progressive weakness, cough and shortness of breath. EXAM: CT CHEST WITH CONTRAST TECHNIQUE: Multidetector CT imaging of the chest was performed during intravenous contrast administration. CONTRAST:  36m ISOVUE-300 IOPAMIDOL (ISOVUE-300) INJECTION 61% COMPARISON:  Radiographs 06/10/2017 and 05/24/2017.  CT 02/19/2017. FINDINGS: Cardiovascular: Mild atherosclerosis of the aorta, great vessels and coronary arteries. There is extrinsic mass effect on the SVC, right internal jugular vein and pulmonary arteries by extensive mediastinal lymphadenopathy, but no evidence of large vessel occlusion or acute pulmonary embolism. The heart size is normal. There is no pericardial effusion.  Mediastinum/Nodes: Patient has developed extensive mediastinal and supraclavicular adenopathy. Representative nodes include a 19 mm right supraclavicular node (image 8/series 2), a 17 mm left supraclavicular node (image 12/2), a 4.3 x 3.5 cm right paratracheal node (image 54) and a 3.2 x 8.2 cm subcarinal node (image 73). There is no axillary adenopathy. The thyroid gland, trachea and esophagus demonstrate no significant findings. Lungs/Pleura: Interval development of a large complex left pleural effusion with multiple loculations and pleural based nodules. As shown on recent radiographs, the left lung and central bronchi are completely collapsed. The right lung remains well aerated. There are right lung emphysematous changes and mild right apical scarring. No suspicious right lung nodules. Upper abdomen: Stable low-density right adrenal nodule consistent with a benign adenoma based on stability. Developing right retrocrural and left celiac adenopathy. Musculoskeletal/Chest wall: There is no chest wall mass or suspicious osseous finding. There is paraspinal tumor in the left pleural space without definite neural foraminal or intraspinal extension. IMPRESSION: 1. Extensive mediastinal, supraclavicular and upper abdominal adenopathy consistent with recurrent small cell lung cancer. 2. There is extensive tumor throughout the left pleural space with a loculated large left pleural effusion and complete collapse of the left lung. 3. No new osseous findings.  Left pleural paraspinal tumor. Electronically Signed   By: WRichardean SaleM.D.   On: 06/10/2017 15:17   Mr BJeri CosWMPContrast  Result Date: 05/24/2017 CLINICAL DATA:  Acute headache.  History of lung cancer. EXAM: MRI HEAD WITHOUT AND WITH CONTRAST TECHNIQUE: Multiplanar, multiecho pulse sequences of the brain and surrounding structures were obtained without and with intravenous contrast. CONTRAST:  155mMULTIHANCE GADOBENATE DIMEGLUMINE 529 MG/ML IV SOLN  COMPARISON:  Head CT from earlier today.  Brain MRI 07/24/2016 FINDINGS: Brain: The left inferior cerebellar abnormality reflects an enhancing superficial mass measuring up to 17 mm. The mass splays folia but is still likely intra-axial. No second lesion is seen. No infarct, hemorrhage, hydrocephalus, or collection. Chronic white matter disease, usually microvascular ischemic. Vascular: 3 mm enhancing outpouching extending inferiorly from the right MCA bifurcation. Skull and upper cervical spine: No noted metastatic lesion. Sinuses/Orbits: No visible marrow lesion. Other: These results were called by telephone at the time of interpretation on 05/24/2017 at 10:46 am to Dr. VAVaughan Basta who verbally acknowledged these results. IMPRESSION: 1. 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 2. 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 At follow-up brain MRI a MRA is recommended. Electronically Signed   By: JoMonte Fantasia.D.   On: 05/24/2017 11:11   Dg Chest Port 1 View  Result Date: 06/11/2017 CLINICAL DATA:  Status post thoracentesis EXAM: PORTABLE CHEST 1 VIEW COMPARISON:  Study obtained earlier in the day FINDINGS: Endotracheal  tube tip is 5.1 cm above the carina. Nasogastric tube tip and side port are below the diaphragm. No pneumothorax. There remains diffuse opacification of the left hemithorax. Right lung is clear. The heart size appears within normal limits. Pulmonary vascularity on the right appears normal. Pulmonary vascularity on the left is obscured. No adenopathy appreciable in visualized regions. There is midthoracic dextroscoliosis. IMPRESSION: Tube positions as described without pneumothorax. Right lung clear. Diffuse opacification of the left hemithorax remains. Question a degree of residual effusion versus consolidation. There may also be a degree of mucous plugging or obstructing lesion in the left main bronchus. This combination of findings may warrant  chest CT, ideally with intravenous contrast, for further delineation. Electronically Signed   By: Lowella Grip III M.D.   On: 06/11/2017 12:16   Dg Chest Port 1 View  Result Date: 06/11/2017 CLINICAL DATA:  67 year old female with respiratory failure. EXAM: PORTABLE CHEST 1 VIEW COMPARISON:  Chest CT dated 06/10/2017 FINDINGS: There has been interval placement of an endotracheal tube with tip approximately 4.3 cm above the carina. An enteric tube extends into the left upper abdomen with tip in the proximal stomach. There is complete opacification of the left hemithorax. An area of hazy density in the right upper lung field adjacent to the overlying support tube noted. There is no pneumothorax. The cardiac borders are silhouetted. No acute osseous pathology. IMPRESSION: 1. Endotracheal tube above the carina and enteric tube in the proximal stomach. 2. Complete opacification of the left hemithorax. Electronically Signed   By: Anner Crete M.D.   On: 06/11/2017 00:38   Dg Abd Portable 1v  Result Date: 06/12/2017 CLINICAL DATA:  NG tube placement EXAM: PORTABLE ABDOMEN - 1 VIEW COMPARISON:  06/11/2017 FINDINGS: The external pacer paddles are been removed. The NG tube tip is in the antropyloric region of the stomach. The bowel gas pattern is unremarkable. Persistent opacified left hemithorax. IMPRESSION: NG tube in good position with the tip in the antropyloric region. Persistent opacified left hemithorax. Electronically Signed   By: Marijo Sanes M.D.   On: 06/12/2017 11:17   Dg Abd Portable 1v  Result Date: 06/11/2017 CLINICAL DATA:  Orogastric tube placement. EXAM: PORTABLE ABDOMEN - 1 VIEW COMPARISON:  Study obtained earlier in the day FINDINGS: Orogastric tube tip and side port in the distal stomach. Visualized bowel gas pattern is unremarkable. No obstruction or free air. Diffuse consolidation left lung noted. IMPRESSION: Orogastric tube tip and side port in stomach. Visualized bowel gas  pattern unremarkable. Electronically Signed   By: Lowella Grip III M.D.   On: 06/11/2017 12:17   US Thoracentesis Asp Pleural Space W/img Guide  Result Date: 06/11/2017 INDICATION: Metastatic lung cancer. Respiratory failure. Left-sided pleural effusion. Request for diagnostic and therapeutic thoracentesis. EXAM: ULTRASOUND GUIDED LEFT THORACENTESIS MEDICATIONS: N None one. COMPLICATIONS: None immediate. Postprocedural chest x-ray negative for pneumothorax. PROCEDURE: An ultrasound guided thoracentesis was thoroughly discussed with the patient and questions answered. The benefits, risks, alternatives and complications were also discussed. The patient understands and wishes to proceed with the procedure. Written consent was obtained. Ultrasound was performed to localize and mark an adequate pocket of fluid in the left chest. The area was then prepped and draped in the normal sterile fashion. 1% Lidocaine was used for local anesthesia. Under ultrasound guidance a Safe-T-Centesis catheter was introduced. Thoracentesis was performed. The catheter was removed and a dressing applied. FINDINGS: A total of approximately 1.2 L of hazy, amber colored fluid was removed. Samples were sent  to the laboratory as requested by the clinical team. IMPRESSION: Successful ultrasound guided left thoracentesis yielding 1.2 L of pleural fluid. Electronically Signed   By: Markus Daft M.D.   On: 06/11/2017 12:33     Assessment and Recommendation  67 y.o. female with known acute on chronic obstructive pulmonary disease with significant infection hypoxia now slightly improved at this time with new onset atrial fibrillation with rapid ventricular rate likely secondary to above and no current evidence of myocardial infarction or congestive heart failure 1.  Continue metoprolol for heart rate control and increase dose to 100 mg twice per day for better heart rate control 2.  Continue anticoagulation for further risk reduction and  stroke with atrial fibrillation and would consider oral anticoagulation when recovering from above 3.  Continue supportive care of acute on chronic obstructive pulmonary disease and infection likely primary cause of above 4.  No further cardiac diagnostics necessary at this time 5.  No use of amiodarone due to concerns of severe COPD and exacerbation with operatory failure  Signed, Serafina Royals M.D. FACC

## 2017-06-13 NOTE — Consult Note (Signed)
PULMONARY / CRITICAL CARE MEDICINE   Name: Sheri Ayala MRN: [id number] DOB: [birth year]    ADMISSION DATE:  06/10/2017 CONSULTATION DATE: 06/10/2017  REFERRING MD: Dr. Jannifer Franklin   CHIEF COMPLAINT: Fall   SUBJECTIVE Remains intubated Critically il S/p thoracentesis Successful ultrasound guided left thoracentesis yielding 1.2 L of pleural fluid.  Left lung remains collapsed and riddled with cancer Family discussion with family plan for wean to extubation today and assess resp status   REVIEW OF SYSTEMS:   Unable to assess pt intubated     VITAL SIGNS: BP 101/65   Pulse (!) 120   Temp 98.3 F (36.8 C)   Resp 16   Ht 5\' 9"  (1.753 m)   Wt 129 lb 13.6 oz (58.9 kg)   SpO2 92%   BMI 19.18 kg/m   VENTILATOR SETTINGS: Vent Mode: PRVC FiO2 (%):  [40 %] 40 % Set Rate:  [16 bmp-20 bmp] 18 bmp Vt Set:  [50 mL-450 mL] 450 mL PEEP:  [5 cmH20] 5 cmH20 Plateau Pressure:  [13 cmH20-19 cmH20] 16 cmH20  INTAKE / OUTPUT: I/O last 3 completed shifts: In: 1735.9 [P.O.:60; I.V.:218.6; NG/GT:1257.3; IV Piggyback:200] Out: 1050 [Urine:1050]    PHYSICAL EXAMINATION: General: acutely ill frail appearing female mechanically intubated  Neuro: sedated, not following commands, PERRL HEENT: supple, mild JVD  Cardiovascular: irregular irregular, no R/G Lungs: diffuse wheezes throughout, even, non labored  Abdomen: +BS x4, soft, non distended  Musculoskeletal: no edema  Skin: intact no rashes or lesions, cyanotic LABS:  BMET Recent Labs  Lab 06/10/17 1214 06/10/17 2309 06/11/17 0815 06/12/17 0643  NA 132* 144  --  140  K 5.7* 7.0* 3.9 4.5  CL 89* 98*  --  100*  CO2 33* 27  --  31  BUN 53* 46*  --  48*  CREATININE 0.78 0.60  --  0.71  GLUCOSE 172* 235*  --  175*    Electrolytes Recent Labs  Lab 06/10/17 1214 06/10/17 2309 06/11/17 1429 06/12/17 0643  CALCIUM 8.8* 8.5*  --  8.5*  MG 2.2  --  2.1 2.1  PHOS  --   --  2.9 3.0    CBC Recent Labs  Lab 06/10/17 1214  06/11/17 0432 06/12/17 0643  WBC 70.3* 27.3* 38.3*  HGB 13.4 12.1 12.7  HCT 44.7 38.3 41.2  PLT 222 139* 136*    Coag's No results for input(s): APTT, INR in the last 168 hours.  Sepsis Markers Recent Labs  Lab 06/10/17 1214 06/10/17 1404 06/10/17 1649 06/10/17 1700 06/11/17 0432 06/12/17 0643  LATICACIDVEN 2.5* 2.1*  --  2.5*  --   --   PROCALCITON  --   --  <0.10  --  0.16 0.19    ABG Recent Labs  Lab 06/11/17 0045 06/11/17 0458  PHART 7.27* 7.45  PCO2ART 70* 56*  PO2ART 163* 171*    Liver Enzymes Recent Labs  Lab 06/10/17 1214  AST 48*  ALT 84*  ALKPHOS 85  BILITOT 0.8  ALBUMIN 3.6    Cardiac Enzymes Recent Labs  Lab 06/10/17 1445 06/10/17 1903 06/10/17 2309  TROPONINI <0.03 <0.03 <0.03    Glucose Recent Labs  Lab 06/12/17 1554 06/12/17 1931 06/12/17 1947 06/12/17 2340 06/13/17 0400 06/13/17 0718  GLUCAP 113* 118* 109* 163* 184* 146*    Imaging  STUDIES:  CT Chest 02/25>>Extensive mediastinal, supraclavicular and upper abdominal adenopathy consistent with recurrent small cell lung cancer. There is extensive tumor throughout the left pleural space with a loculated large  left pleural effusion and complete collapse of the left lung. No new osseous findings.  Left pleural paraspinal tumor.  CULTURES: Blood x2 02/25>> Sputum 02/25>>  ANTIBIOTICS: completed Cefepime 02/25>> Vancomycin 02/25>>  LINES/TUBES: ETT 02/26>>  ASSESSMENT / PLAN: 68 yo white female with extensive and end stage small cell lung cancer with severe hypoxic resp failure from complete lung collapse from lung cancer with loculated effusions  PULMONARY A: Acute on chronic hypoxic respiratory failure secondary to AECOPD, Loculated Large Left Pleural Effusion, and Small Cell Lung Carcinoma Mechanical Intubation  Hx: Tobacco Abuse and Asthma  P:   Full vent support-vent settings reviewed and established VAP bundle implemented Scheduled and prn bronchodilator  therapy  IV and nebulized steroids  Consider IR consult for palliative left thoracentesis 1.2 L removed  CARDIOVASCULAR A:  Atrial Fibrillation with RVR  P:  Continuous telemetry monitoring  Maintain map >65  Cardiology consulted appreciate input   RENAL A:   Hyperkalemia  P:   Trend BMP  Replace electrolytes as indicated  Monitor UOP   GASTROINTESTINAL A:   No acute issues  P:   Start TF's Pepcid for SUP   HEMATOLOGIC A:   CLL with extreme leukocytosis  Recurrent Small Cell Lung Carcinoma with extensive mediastinal and supraclavicular adenopathy New CNS metastasis  Hx: Thrombocytopenia  P:  Oncology consulted appreciate input  Subq heparin for VTE prophylaxis  Trend CBC Monitor for s/sx of bleeding and transfuse for hgb <7  INFECTIOUS A:   Extreme Leukocytosis secondary to possible left sided empyema (recently treated for pneumonia 2 weeks ago) and CLL P:   Trend WBC and monitor fever curve  Trend PCT and lactic acid  Follow cultures Continue current abx  Will likely need left sided thoracentesis with cytology to r/o infection vs. malignancy   ENDOCRINE A:   Hyperglycemia    P:   CBG's q4hrs    NEUROLOGIC A:   Newly dx CNS Metastasis  Mechanical Intubation Discomfort/Pain  Hx: CVA  P:   RASS goal: 0 to -1 Fentanyl gtt to maintain RASS goal and for pain management     -Palliative Care consulted strongly recommend discussing goals of treatment and changing code status to DNR due to overall poor prognosis.   Critical Care Time devoted to patient care services described in this note is 35 minutes.   Overall, patient is critically ill, prognosis is guarded.  Patient with Multiorgan failure and at high risk for cardiac arrest and death.   Plan for trial of extubation alter today and assess resp status, and if she decompensates then will proceed with comfort care measures   Corrin Parker, M.D.  [facility]   Medical Director [place] Director [facility] Cardio-Pulmonary Department

## 2017-06-13 NOTE — Progress Notes (Signed)
   06/13/17 1300  Clinical Encounter Type  Visited With Family  Visit Type Follow-up  Spiritual Encounters  Spiritual Needs Emotional   Met sisters in hallway, conversation around decisions yet to be made and how to support patient son.  Chaplain offering emotional support

## 2017-06-13 NOTE — Progress Notes (Signed)
   06/13/17 1925  Clinical Encounter Type  Visited With Family  Visit Type Follow-up  Spiritual Encounters  Spiritual Needs Emotional   Chaplain checked in with patient son and his spouse.  Conversation regarding day's activities and patient's current health condition.  Son planning to spend the night.

## 2017-06-13 NOTE — Progress Notes (Signed)
   06/13/17 1125  Clinical Encounter Type  Visited With Patient and family together  Visit Type Follow-up  Spiritual Encounters  Spiritual Needs Emotional;Prayer   Chaplain met with patient, family and friends, offered emotional support.  Led prayer with family and encouraged patient to open her heart and mind to share her prayers and hopes with God.

## 2017-06-13 NOTE — Progress Notes (Signed)
Re-paged Dr Nehemiah Massed regarding patients heart rate in the 140's.

## 2017-06-13 NOTE — Progress Notes (Signed)
Spoke with Dr Josefa Half regarding patients heart rate 130-150's. Orders to start Cardizem drip at 12 mg an hour to achieve a heart rate below 100 and to give Cardizem bolus of 15 mg once. Dr Josefa Half aware that patient has an allergy to Cardizem -rash. Per MD proceed with Cardizem.

## 2017-06-13 NOTE — Progress Notes (Signed)
Daily Progress Note   Patient Name: Sheri Ayala       Date: 06/13/2017 DOB: April 03, 1951  Age: 67 y.o. MRN#: [id number] Attending Physician: Gladstone Lighter, MD Primary Care Physician: Alvester Chou, NP Admit Date: 06/10/2017  Reason for Consultation/Follow-up: Establishing goals of care  Subjective: Sheri Ayala is resting in bed. She is on the ventilator. Family at bedside. They state they are ready to extubate her with plans to not re intubate. Patient passed SBT with RT prior to extubation. Fentanyl decreased as tolerated and discontinued as tolerated. Patient is alert and talking with family. No distress noted. Robinul ordered for excessive secretions. Fentanyl and Ativan ordered as needed for pain, SOB, anxiety. Remaining at bedside to offer support.   Hope for discharge home with hospice.   Length of Stay: 3  Current Medications: Scheduled Meds:  . budesonide (PULMICORT) nebulizer solution  0.5 mg Nebulization BID  . chlorhexidine gluconate (MEDLINE KIT)  15 mL Mouth Rinse BID  . famotidine  20 mg Per Tube BID  . feeding supplement (VITAL 1.5 CAL)  1,000 mL Per Tube Q24H  . heparin  5,000 Units Subcutaneous Q8H  . insulin aspart  0-9 Units Subcutaneous Q4H  . ipratropium-albuterol  3 mL Nebulization Q4H  . mouth rinse  15 mL Mouth Rinse 10 times per day  . methylPREDNISolone (SOLU-MEDROL) injection  40 mg Intravenous Q12H  . metoprolol tartrate  100 mg Oral BID  . multivitamin  15 mL Per Tube Daily  . sertraline  25 mg Per Tube QHS  . topiramate  50 mg Per Tube Daily    Continuous Infusions: . ceFEPime (MAXIPIME) IV Stopped (06/13/17 1012)  . fentaNYL infusion INTRAVENOUS 40 mcg/hr (06/13/17 1316)    PRN Meds: docusate, fentaNYL, glycopyrrolate, levalbuterol, LORazepam,  metoprolol tartrate  Physical Exam  Constitutional: No distress.  Pulmonary/Chest: Effort normal.  Neurological: She is alert.  Speaking with family.   Skin: Skin is warm and dry.            Vital Signs: BP 112/77   Pulse (!) 125   Temp 98.3 F (36.8 C)   Resp 19   Ht '5\' 9"'$  (1.753 m)   Wt 58.9 kg (129 lb 13.6 oz)   SpO2 91%   BMI 19.18 kg/m  SpO2: SpO2: 91 % O2 Device: O2  Device: Ventilator O2 Flow Rate: O2 Flow Rate (L/min): 3 L/min  Intake/output summary:   Intake/Output Summary (Last 24 hours) at 06/13/2017 1348 Last data filed at 06/13/2017 0600 Gross per 24 hour  Intake 759.05 ml  Output 700 ml  Net 59.05 ml   LBM: Last BM Date: 06/11/17 Baseline Weight: Weight: 54.4 kg (120 lb) Most recent weight: Weight: 58.9 kg (129 lb 13.6 oz)       Palliative Assessment/Data: Extubated today.      Patient Active Problem List   Diagnosis Date Noted  . Fall at home, initial encounter 06/10/2017  . Atrial fibrillation with RVR ([place]) 06/10/2017  . Pneumonia 06/10/2017  . Acute on chronic respiratory failure ([place]) 05/24/2017  . Brain metastases ([place]) 05/24/2017  . PICC (peripherally inserted central catheter) flush 09/05/2016  . Port catheter in place 08/15/2016  . Small cell lung cancer in adult ([place]) 07/25/2016  . Lobar pneumonia ([place]) 07/20/2016  . Hilar adenopathy 07/20/2016  . Community acquired pneumonia 07/16/2016  . Leukocytosis 07/16/2016  . Obstructive atelectasis 07/15/2016  . Pleural effusion   . S/P thoracentesis   . Pleural effusion on right   . History of CVA (cerebrovascular accident) 05/01/2016  . HCAP (healthcare-associated pneumonia) 04/30/2016  . Chronic a-fib ([place]) 04/30/2016  . Severe protein-calorie malnutrition ([place])   . Mass of left lung 04/10/2016  . Acute on chronic respiratory failure with hypoxia ([place]) 01/30/2015  . Right lower lobe pneumonia ([place]) 01/30/2015  . SOB (shortness of breath) 12/24/2012  . COPD with acute exacerbation  ([place]) 12/22/2012  . Headache in front of head 03/27/2011  . Thrombocytopenia ([place]) 03/27/2011  . CLL (chronic lymphocytic leukemia) ([place]) 03/09/2011  . Cigarette smoker 03/09/2011    Palliative Care Assessment & Plan   Patient Profile: Sheri Ayala a66 y.o.femalewith a known history of anxiety, atrial fibrillation, bronchospasm, cerebrovascular accident, COPD, depression, CLL, small cell lung cancer in remission, recently diagnosed metastasis and brain-was admitted to hospital 2 weeks ago with pneumonia and at that time because of headache her CT scan and MRI of the brain was done which diagnosed a new metastasis in the brain.   Assessment/ Recommendations/Plan:  Extubated. Alert and oriented, spending time with family.   Goals of Care and Additional Recommendations:  Continue extubated current level of care. If patient declines, transition to comfort care.   Code Status:    Code Status Orders  (From admission, onward)        Start     Ordered   06/12/17 1457  Do not attempt resuscitation (DNR)  Continuous    Question Answer Comment  In the event of cardiac or respiratory ARREST Do not call a "code blue"   In the event of cardiac or respiratory ARREST Do not perform Intubation, CPR, defibrillation or ACLS   In the event of cardiac or respiratory ARREST Use medication by any route, position, wound care, and other measures to relive pain and suffering. May use oxygen, suction and manual treatment of airway obstruction as needed for comfort.      06/12/17 1456    Code Status History    Date Active Date Inactive Code Status Order ID Comments User Context   06/10/2017 16:44 06/12/2017 14:56 Full Code [id number]  Vaughan Basta, MD Inpatient   05/24/2017 13:12 05/27/2017 15:06 DNR [id number]  Vaughan Basta, MD ED   07/15/2016 20:20 07/27/2016 18:40 Full Code [id number]  Vianne Bulls, MD ED   04/30/2016 20:16 05/07/2016 18:25 Full Code [id number]  Barton Dubois, MD  Inpatient   04/18/2016 10:37 04/19/2016 16:02 Full Code [id number]  Erick Colace, NP Inpatient   01/29/2015 20:59 01/31/2015 17:21 Full Code [id number]  Henreitta Leber, MD Inpatient   12/22/2012 21:21 12/25/2012 17:35 Full Code [id number]  Rise Patience, MD Inpatient   03/27/2011 17:44 03/29/2011 17:01 Full Code [id number]  Rexene Alberts, MD ED   03/09/2011 23:04 03/12/2011 17:41 Full Code [id number]  Ron Parker, RN Inpatient    Advance Directive Documentation     Most Recent Value  Type of Advance Directive  Healthcare Power of Attorney, Living will  Pre-existing out of facility DNR order (yellow form or pink MOST form)  No data  "MOST" Form in Place?  No data       Prognosis:  Poor. Lung cancer, brain mets.   Discharge Planning:  To Be Determined  Care plan was discussed with RN and family  Thank you for allowing the Palliative Medicine Team to assist in the care of this patient.   Time In: 12:00 Time Out: 2:30 Total Time 2 hours 30 min Prolonged Time Billed  yes      Greater than 50%  of this time was spent counseling and coordinating care related to the above assessment and plan.  Asencion Gowda, NP  Please contact Palliative Medicine Team phone at [phone] for questions and concerns.

## 2017-06-13 NOTE — Progress Notes (Signed)
Pt was suctioned prior to extubation for a small amount of clear secretions. She was extubated per the Hospice RN. This is a one way extubation. The pt was placed on a 6 L nasal cannula with a Sa02 of 91%.

## 2017-06-13 NOTE — Progress Notes (Signed)
Per Dr Mortimer Fries no wake up assessment untill family gets here at noon. MD aware that patients heart rate does go up into the 120-130's. At this time no additional orders given and to maintain sedation. Tube feeds have been stopped.

## 2017-06-13 NOTE — Progress Notes (Signed)
Paged Dr Nehemiah Massed regarding patients heart rate130's.

## 2017-06-13 NOTE — Progress Notes (Signed)
[facility] [place] [facility] [place] NAME: Sheri Ayala    MR#:  [id number]  DATE OF BIRTH:  1950-11-12  SUBJECTIVE:  CHIEF COMPLAINT:   Chief Complaint  Patient presents with  . Shortness of Breath   - Patient with history of small cell lung cancer with brain mets- admitted for acute resp failure, remains on vent - no significant change, plan for terminal extubation today  REVIEW OF SYSTEMS:  Review of Systems  Unable to perform ROS: Critical illness    DRUG ALLERGIES:   Allergies  Allergen Reactions  . Eliquis [Apixaban] Rash    Hives, and difficulty swallowing  . Cardizem Cd [Diltiazem Hcl Er Beads] Rash  . Citrus Other (See Comments)    Burning in stomach   . Penicillins Hives, Nausea And Vomiting, Swelling and Other (See Comments)    Patient reports tolerating ampicillin.  Has patient had a PCN reaction causing immediate rash, facial/tongue/throat swelling, SOB or lightheadedness with hypotension: Yes Has patient had a PCN reaction causing severe rash involving mucus membranes or skin necrosis: unknown Has patient had a PCN reaction that required hospitalization No Has patient had a PCN reaction occurring within the last 10 years: No If all of the above answers are "NO", then may proceed with Cephalosporin use.     VITALS:  Blood pressure 105/67, pulse (!) 115, temperature 98.3 F (36.8 C), resp. rate 17, height 5\' 9"  (1.753 m), weight 58.9 kg (129 lb 13.6 oz), SpO2 92 %.  PHYSICAL EXAMINATION:  Physical Exam  GENERAL:  67 y.o.-year-old patient lying in the bed, critically ill-appearing, intubated and sedated. EYES: Pupils equal, round, reactive to light and accommodation. No scleral icterus. Extraocular muscles intact.  HEENT: Head atraumatic, normocephalic. Oropharynx and nasopharynx clear. Orally intubated NECK:  Supple, no jugular venous distention. No thyroid enlargement, no tenderness.  LUNGS: Expiratory wheezes noted all over  the lung fields, decreased breath sounds at left base, no rales,rhonchi or crepitation. No use of accessory muscles of respiration.  CARDIOVASCULAR: S1, S2 normal. No murmurs, rubs, or gallops.  ABDOMEN: Soft, nontender, nondistended. Bowel sounds present. No organomegaly or mass.  EXTREMITIES: No pedal edema, cyanosis, or clubbing.  NEUROLOGIC: Patient is sedated, however sometimes arousable to palpation and following simple commands  PSYCHIATRIC: The patient is on sedation this AM   SKIN: No obvious rash, lesion, or ulcer.    LABORATORY PANEL:   CBC Recent Labs  Lab 06/12/17 0643  WBC 38.3*  HGB 12.7  HCT 41.2  PLT 136*   ------------------------------------------------------------------------------------------------------------------  Chemistries  Recent Labs  Lab 06/10/17 1214  06/12/17 0643  NA 132*   < > 140  K 5.7*   < > 4.5  CL 89*   < > 100*  CO2 33*   < > 31  GLUCOSE 172*   < > 175*  BUN 53*   < > 48*  CREATININE 0.78   < > 0.71  CALCIUM 8.8*   < > 8.5*  MG 2.2   < > 2.1  AST 48*  --   --   ALT 84*  --   --   ALKPHOS 85  --   --   BILITOT 0.8  --   --    < > = values in this interval not displayed.   ------------------------------------------------------------------------------------------------------------------  Cardiac Enzymes Recent Labs  Lab 06/10/17 2309  TROPONINI <0.03   ------------------------------------------------------------------------------------------------------------------  RADIOLOGY:  Dg Chest Port 1 View  Result Date: 06/11/2017  CLINICAL DATA:  Status post thoracentesis EXAM: PORTABLE CHEST 1 VIEW COMPARISON:  Study obtained earlier in the day FINDINGS: Endotracheal tube tip is 5.1 cm above the carina. Nasogastric tube tip and side port are below the diaphragm. No pneumothorax. There remains diffuse opacification of the left hemithorax. Right lung is clear. The heart size appears within normal limits. Pulmonary vascularity on the  right appears normal. Pulmonary vascularity on the left is obscured. No adenopathy appreciable in visualized regions. There is midthoracic dextroscoliosis. IMPRESSION: Tube positions as described without pneumothorax. Right lung clear. Diffuse opacification of the left hemithorax remains. Question a degree of residual effusion versus consolidation. There may also be a degree of mucous plugging or obstructing lesion in the left main bronchus. This combination of findings may warrant chest CT, ideally with intravenous contrast, for further delineation. Electronically Signed   By: Lowella Grip III M.D.   On: 06/11/2017 12:16   Dg Abd Portable 1v  Result Date: 06/12/2017 CLINICAL DATA:  NG tube placement EXAM: PORTABLE ABDOMEN - 1 VIEW COMPARISON:  06/11/2017 FINDINGS: The external pacer paddles are been removed. The NG tube tip is in the antropyloric region of the stomach. The bowel gas pattern is unremarkable. Persistent opacified left hemithorax. IMPRESSION: NG tube in good position with the tip in the antropyloric region. Persistent opacified left hemithorax. Electronically Signed   By: Marijo Sanes M.D.   On: 06/12/2017 11:17   Dg Abd Portable 1v  Result Date: 06/11/2017 CLINICAL DATA:  Orogastric tube placement. EXAM: PORTABLE ABDOMEN - 1 VIEW COMPARISON:  Study obtained earlier in the day FINDINGS: Orogastric tube tip and side port in the distal stomach. Visualized bowel gas pattern is unremarkable. No obstruction or free air. Diffuse consolidation left lung noted. IMPRESSION: Orogastric tube tip and side port in stomach. Visualized bowel gas pattern unremarkable. Electronically Signed   By: Lowella Grip III M.D.   On: 06/11/2017 12:17   US Thoracentesis Asp Pleural Space W/img Guide  Result Date: 06/11/2017 INDICATION: Metastatic lung cancer. Respiratory failure. Left-sided pleural effusion. Request for diagnostic and therapeutic thoracentesis. EXAM: ULTRASOUND GUIDED LEFT THORACENTESIS  MEDICATIONS: N None one. COMPLICATIONS: None immediate. Postprocedural chest x-ray negative for pneumothorax. PROCEDURE: An ultrasound guided thoracentesis was thoroughly discussed with the patient and questions answered. The benefits, risks, alternatives and complications were also discussed. The patient understands and wishes to proceed with the procedure. Written consent was obtained. Ultrasound was performed to localize and mark an adequate pocket of fluid in the left chest. The area was then prepped and draped in the normal sterile fashion. 1% Lidocaine was used for local anesthesia. Under ultrasound guidance a Safe-T-Centesis catheter was introduced. Thoracentesis was performed. The catheter was removed and a dressing applied. FINDINGS: A total of approximately 1.2 L of hazy, amber colored fluid was removed. Samples were sent to the laboratory as requested by the clinical team. IMPRESSION: Successful ultrasound guided left thoracentesis yielding 1.2 L of pleural fluid. Electronically Signed   By: Markus Daft M.D.   On: 06/11/2017 12:33    EKG:   Orders placed or performed during the hospital encounter of 06/10/17  . ED EKG 12-Lead  . ED EKG 12-Lead  . EKG 12-Lead  . EKG 12-Lead  . EKG 12-Lead  . EKG 12-Lead    ASSESSMENT AND PLAN:   67 year old female with past medical history significant for small cell lung cancer, COPD on 2 L home oxygen chronically, mild CLL, recent diagnosis of left cerebellar metastatic brain lesion on Decadron,  A. fib presents to the hospital secondary to worsening shortness of breath.  1. Acute hypoxic respiratory failure-secondary to left-sided pleural effusion and white out of left lung, likely recurrence of small cell lung cancer.large mass behind left main bronchus -Initially admitted to the floor on nasal cannula. Was receiving amiodarone for her increased heart rate, went into acute respiratory distress and currently intubated. On 40% FiO2 still  this  morning. -s/p  thoracentesis on the left side with no significant improvement in CXR. Cardio thoracic surgery consulted to see if any further measures. -Further management by the intensivist -Continue antibiotics with cefepime - overall poor prognosis from rapidly progressing recurrent small cell lung cancer- plan for terminal wean and extubation today.  2. A. fib with RVR-likely worsened with her respiratory condition. Rate still elevated. Appreciate cardiology consult. -Avoid amiodarone. Continue metoprolol. With her brain metastatic lesion, not a candidate for anticoagulation.  3. Metastatic brain metastases-noted to have left inferior cerebellar mass based on the MRI during last admission 2 weeks ago. Was on Decadron at home and supposed to start whole brain radiation prior to admission. Currently on hold -Appreciate oncology consult  4. CLL with extreme leukocytosis on admission-likely secondary to recent steroid use and reactive leukocytosis. -Continue to monitor at this time. Improved now.  5. DVT prophylaxis-continue subcutaneous heparin.  Overall poor prognosis, family will be present this afternoon- plan for terminal wean and extubation today If fails- comfort care Patient is a DNR now   All the records are reviewed and case discussed with Care Management/Social Workerr. Management plans discussed with the patient, family and they are in agreement.  CODE STATUS:DNR  TOTAL TIME TAKING CARE OF THIS PATIENT: 38  minutes.   POSSIBLE D/C IN 2-3 DAYS, DEPENDING ON CLINICAL CONDITION.   Gladstone Lighter M.D on 06/13/2017 at 11:43 AM  Between 7am to 6pm - Pager - [phone]  After 6pm go to [email or web address] - password [email or web address] [facility]  Office  [phone]  CC: Primary care physician; Alvester Chou, NP

## 2017-06-13 NOTE — Progress Notes (Signed)
Nutrition Follow-up  DOCUMENTATION CODES:   Not applicable  INTERVENTION:  Will discontinue order for tube feeds and liquid MVI per tube as patient is now extubated and OGT was removed.  RD will monitor for diet advancement and recommend appropriate ONS as indicated. In the past patient has enjoyed [employer]. Will continue to monitor goals of care/plan of care.  NUTRITION DIAGNOSIS:   Inadequate oral intake related to inability to eat as evidenced by NPO status.  Ongoing.  GOAL:   Patient will meet greater than or equal to 90% of their needs  Not met.  MONITOR:   Diet advancement, Labs, Weight trends, I & O's  REASON FOR ASSESSMENT:   Ventilator    ASSESSMENT:   67 yo female admitted 2/25 with Afib/RVR secondary to hypoxic pneumonia in the setting of COPD. PMH rt pleural effusion, small cell lung carcinoma, on home O2, s/p thoracentesis, brain metastases with hx CVA.  Patient was extubated today with no plans to re-intubate. OGT was removed after extubation. Per chart patient has been awake and talking with her family. She is currently on [place] with 6L/min O2. Plan is for discharge home with hospice if possible. If patient declines plan is for transition to comfort care.  Medications reviewed and include: famotidine, Novolog 0-9 units Q4hrs, Solu-Medrol 40 mg Q12hrs IV, liquid MVI daily per tube, cefepime.  Labs reviewed: CBG 109-184. On 2/27 Potassium, Phosphorus, and Magnesium WNL.  I/O: 700 mL UOP yesterday (0.5 mL/kg/hr)  Weight trend: 58.9 kg on 2/28; -3 kg from 2/26  Discussed with RN and on rounds.  Diet Order:  No diet orders on file  EDUCATION NEEDS:   Not appropriate for education at this time  Skin:  Skin Assessment: Reviewed RN Assessment  Last BM:  06/11/2017 - no BM characteristics documented  Height:   Ht Readings from Last 1 Encounters:  06/11/17 '5\' 9"'$  (1.753 m)    Weight:   Wt Readings from Last 1 Encounters:  06/13/17 129 lb 13.6 oz  (58.9 kg)    Ideal Body Weight:  67 kg  BMI:  Body mass index is 19.18 kg/m.  Estimated Nutritional Needs:   Kcal:  1550-1790 (MSJ x 1.3-1.5)  Protein:  65-82 grams (1.2-1.5 g/kg)  Fluid:  1.6 L (70m/kg)  LWilley Blade MS, RD, LDN Office: [phone]: [phone] Hours/Weekend Pager: [phone]

## 2017-06-13 NOTE — Progress Notes (Signed)
   06/13/17 1335  Clinical Encounter Type  Visited With Patient and family together  Visit Type Follow-up  Spiritual Encounters  Spiritual Needs Emotional   Chaplain offering emotional support to family as patient was extubated.  Chaplain maintained pastoral presence when family and chaplain entered the room, chaplain offered emotional support to family members as patient began trying to speak with them.  Patient and son desired time alone together; chaplain and other family members exited room.

## 2017-06-13 NOTE — Progress Notes (Signed)
Hematology/Oncology Progress Note [facility] Telephone:([phone] [phone]  Patient Care Team: Alvester Chou, NP as PCP - General (Nurse Practitioner)   Name of the patient: Sheri Ayala  [id number]  [birth year]  Date of visit: 06/13/17   INTERVAL HISTORY- Patient was seen and examined at bedside. Patient was just extubated. She passed SBT prior to extubation. Family and patient have decided not to re intubate.  Patient feels breathing is better after thoracentesis, still quite shortness of breath.   Review of systems-  Not able to obtain in detail due to tachypnea/critical illness.   Allergies  Allergen Reactions  . Eliquis [Apixaban] Rash    Hives, and difficulty swallowing  . Cardizem Cd [Diltiazem Hcl Er Beads] Rash  . Citrus Other (See Comments)    Burning in stomach   . Penicillins Hives, Nausea And Vomiting, Swelling and Other (See Comments)    Patient reports tolerating ampicillin.  Has patient had a PCN reaction causing immediate rash, facial/tongue/throat swelling, SOB or lightheadedness with hypotension: Yes Has patient had a PCN reaction causing severe rash involving mucus membranes or skin necrosis: unknown Has patient had a PCN reaction that required hospitalization No Has patient had a PCN reaction occurring within the last 10 years: No If all of the above answers are "NO", then may proceed with Cephalosporin use.     Patient Active Problem List   Diagnosis Date Noted  . Fall at home, initial encounter 06/10/2017  . Atrial fibrillation with RVR ([place]) 06/10/2017  . Pneumonia 06/10/2017  . Acute on chronic respiratory failure ([place]) 05/24/2017  . Brain metastases ([place]) 05/24/2017  . PICC (peripherally inserted central catheter) flush 09/05/2016  . Port catheter in place 08/15/2016  . Small cell lung cancer in adult ([place]) 07/25/2016  . Lobar pneumonia ([place]) 07/20/2016  . Hilar adenopathy 07/20/2016  . Community acquired  pneumonia 07/16/2016  . Leukocytosis 07/16/2016  . Obstructive atelectasis 07/15/2016  . Pleural effusion   . S/P thoracentesis   . Pleural effusion on right   . History of CVA (cerebrovascular accident) 05/01/2016  . HCAP (healthcare-associated pneumonia) 04/30/2016  . Chronic a-fib ([place]) 04/30/2016  . Severe protein-calorie malnutrition ([place])   . Mass of left lung 04/10/2016  . Acute on chronic respiratory failure with hypoxia ([place]) 01/30/2015  . Right lower lobe pneumonia ([place]) 01/30/2015  . SOB (shortness of breath) 12/24/2012  . COPD with acute exacerbation ([place]) 12/22/2012  . Headache in front of head 03/27/2011  . Thrombocytopenia ([place]) 03/27/2011  . CLL (chronic lymphocytic leukemia) ([place]) 03/09/2011  . Cigarette smoker 03/09/2011     Past Medical History:  Diagnosis Date  . Allergy   . 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 Anxiety   . Asthma   . Atrial fibrillation ([place]) 04/2016  . Brain metastases ([place]) 2019  . Bronchospasm   . Cerebrovascular accident (CVA) ([place]) 08/28/2015  . Chronic lymphocytic leukemia ([place]) 2004  . COPD (chronic obstructive pulmonary disease) ([place])   . Depression   . Emphysema of lung ([place])   . Frontal sinusitis November 2012  . H/O viral encephalitis   . Headache(784.0)   . Heart murmur   . Mitral prolapse 11/24/2015  . Mitral regurgitation 11/24/2015  . Olecranon bursitis of left elbow November 2012.   in past  . Pleurisy   . Pneumonia    13 times  . Rhinitis   . Small cell lung cancer ([place])   . Thrombocytopenia ([place])   .  Tobacco abuse   . Tricuspid valve regurgitation 11/24/2015     Past Surgical History:  Procedure Laterality Date  . ABDOMINAL HYSTERECTOMY    . COLONOSCOPY    . UPPER GASTROINTESTINAL ENDOSCOPY    . VIDEO BRONCHOSCOPY Bilateral 04/18/2016   Procedure: VIDEO BRONCHOSCOPY WITHOUT FLUORO;  Surgeon: Tanda Rockers, MD;  Location: [facility] ENDOSCOPY;  Service: Cardiopulmonary;  Laterality:  Bilateral;  . VIDEO BRONCHOSCOPY WITH ENDOBRONCHIAL ULTRASOUND N/A 07/23/2016   Procedure: VIDEO BRONCHOSCOPY WITH ENDOBRONCHIAL ULTRASOUND;  Surgeon: Grace Isaac, MD;  Location: [facility] OR;  Service: Thoracic;  Laterality: N/A;    Social History   Socioeconomic History  . Marital status: Single    Spouse name: Not on file  . Number of children: Not on file  . Years of education: Not on file  . Highest education level: Not on file  Social Needs  . Financial resource strain: Not on file  . Food insecurity - worry: Not on file  . Food insecurity - inability: Not on file  . Transportation needs - medical: Not on file  . Transportation needs - non-medical: Not on file  Occupational History  . Not on file  Tobacco Use  . Smoking status: Former Smoker    Packs/day: 0.00    Years: 45.00    Pack years: 0.00  . Smokeless tobacco: Never Used  Substance and Sexual Activity  . Alcohol use: No    Alcohol/week: 0.0 oz  . Drug use: No  . Sexual activity: Not Currently  Other Topics Concern  . Not on file  Social History Narrative  . Not on file     Family History  Problem Relation Age of Onset  . Aneurysm Mother   . Liver disease Father   . Congestive Heart Failure Brother   . Heart disease Brother   . Colon cancer Neg Hx   . Esophageal cancer Neg Hx   . Pancreatic cancer Neg Hx   . Rectal cancer Neg Hx   . Stomach cancer Neg Hx      Current Facility-Administered Medications:  .  budesonide (PULMICORT) nebulizer solution 0.5 mg, 0.5 mg, Nebulization, BID, Kasa, Kurian, MD, 0.5 mg at 06/13/17 1955 .  ceFEPIme (MAXIPIME) 2 g in sodium chloride 0.9 % 100 mL IVPB, 2 g, Intravenous, Q12H, Lenis Noon, RPH, Last Rate: 200 mL/hr at 06/13/17 2208, 2 g at 06/13/17 2208 .  chlorhexidine gluconate (MEDLINE KIT) (PERIDEX) 0.12 % solution 15 mL, 15 mL, Mouth Rinse, BID, Blakeney, Dreama Saa, NP, 15 mL at 06/13/17 0711 .  diltiazem (CARDIZEM) 100 mg in dextrose 5 % 100 mL (1 mg/mL) infusion,  5-15 mg/hr, Intravenous, Titrated, Paraschos, Alexander, MD, Last Rate: 12 mL/hr at 06/13/17 1725, 12 mg/hr at 06/13/17 1725 .  docusate (COLACE) 50 MG/5ML liquid 100 mg, 100 mg, Per Tube, BID PRN, Awilda Bill, NP .  famotidine (PEPCID) tablet 20 mg, 20 mg, Per Tube, BID, Flora Lipps, MD, 20 mg at 06/13/17 2231 .  fentaNYL (SUBLIMAZE) injection 25 mcg, 25 mcg, Intravenous, Q1H PRN, Laurann Montana, [place], NP .  glycopyrrolate (ROBINUL) injection 0.1 mg, 0.1 mg, Intravenous, Q4H PRN, Laurann Montana, [place], NP .  heparin injection 5,000 Units, 5,000 Units, Subcutaneous, Q8H, Vaughan Basta, MD, 5,000 Units at 06/13/17 2211 .  insulin aspart (novoLOG) injection 0-9 Units, 0-9 Units, Subcutaneous, Q4H, Awilda Bill, NP, 1 Units at 06/13/17 2023 .  ipratropium-albuterol (DUONEB) 0.5-2.5 (3) MG/3ML nebulizer solution 3 mL, 3 mL, Nebulization, Q4H, Kasa, Kurian, MD, 3 mL  at 06/13/17 1955 .  levalbuterol (XOPENEX) nebulizer solution 0.63 mg, 0.63 mg, Nebulization, Q6H PRN, Awilda Bill, NP .  LORazepam (ATIVAN) injection 1 mg, 1 mg, Intravenous, Q1H PRN, Asencion Gowda, NP, 1 mg at 06/13/17 2034 .  MEDLINE mouth rinse, 15 mL, Mouth Rinse, 10 times per day, Awilda Bill, NP, 15 mL at 06/13/17 2214 .  methylPREDNISolone sodium succinate (SOLU-MEDROL) 40 mg/mL injection 40 mg, 40 mg, Intravenous, Q12H, Blakeney, Dana G, NP, 40 mg at 06/13/17 1138 .  metoprolol tartrate (LOPRESSOR) injection 2.5-5 mg, 2.5-5 mg, Intravenous, Q6H PRN, Awilda Bill, NP, 5 mg at 06/13/17 1354 .  metoprolol tartrate (LOPRESSOR) tablet 100 mg, 100 mg, Oral, BID, Corey Skains, MD, 100 mg at 06/13/17 2232 .  sertraline (ZOLOFT) tablet 25 mg, 25 mg, Per Tube, QHS, Flora Lipps, MD, 25 mg at 06/13/17 2229 .  topiramate (TOPAMAX) tablet 50 mg, 50 mg, Per Tube, Daily, Awilda Bill, NP, 50 mg at 06/13/17 0900   Physical exam:  Vitals:   06/13/17 2000 06/13/17 2100 06/13/17 2200 06/13/17 2231  BP: 140/74  116/71 131/75 131/75  Pulse: (!) 113 (!) 109 (!) 108 (!) 116  Resp: (!) 26 17 (!) 26   Temp: 98.3 F (36.8 C)     TempSrc: Oral     SpO2: 91% 94% 93%   Weight:      Height:       GENERAL:Alert, mild respiratory stress  EYES: no pallor or icterus OROPHARYNX: no thrush or ulceration; good dentition  NECK: supple, LUNGS: + wheezing. Decreased breath sounds. HEART/CVS: tachycardia, irregular rhythm ABDOMEN: abdomen soft, non-tender and normal bowel sounds Musculoskeletal:no cyanosis of digits and no clubbing  PSYCH: awake and alert NEURO: no focal motor/sensory deficits SKIN:  no rashes or significant lesions     CMP Latest Ref Rng & Units 06/12/2017  Glucose 65 - 99 mg/dL 175(H)  BUN 6 - 20 mg/dL 48(H)  Creatinine 0.44 - 1.00 mg/dL 0.71  Sodium 135 - 145 mmol/L 140  Potassium 3.5 - 5.1 mmol/L 4.5  Chloride 101 - 111 mmol/L 100(L)  CO2 22 - 32 mmol/L 31  Calcium 8.9 - 10.3 mg/dL 8.5(L)  Total Protein 6.5 - 8.1 g/dL -  Total Bilirubin 0.3 - 1.2 mg/dL -  Alkaline Phos 38 - 126 U/L -  AST 15 - 41 U/L -  ALT 14 - 54 U/L -   CBC Latest Ref Rng & Units 06/12/2017  WBC 3.6 - 11.0 K/uL 38.3(H)  Hemoglobin 12.0 - 16.0 g/dL 12.7  Hematocrit 35.0 - 47.0 % 41.2  Platelets 150 - 440 K/uL 136(L)   Assessment and plan- Patient is a82 y.o.femalewith history small cell lung cancer, previously treated and recent newly found brain metastasis, currently admitted due to weakness s/p fall. CXR revealed completeopacificationof left thorax.   # Acute on chronic respiratory failure: due to left side pleural effusion. S/p thoracentesis.  Cytology is positive for malignancy, compatible with small cell carcinoma.  Recommend pleural catheter placement give the high likelihood of fast reaccumulation of fluid.  # Recurrent small cell lung cancer: poor prognosis, last chemotherapy was on 11/21/2017, recurrent disease around 6 months. Overall poor prognosis. Discussed with patient and his son. If  patient's condition continues to deteriorate, palliative care/hospice is recommended. If respiratory status stabilized and overall condition improves, may consider second line treatment.   # CLL with extreme leukocytosis: likely secondary recent steroid use and reactive to tumor recurrence. Continue supportive care.  Will continue  follow her inpatient course.  Thank you for involving me in this patient's care.  Earlie Server, MD, PhD Hematology Oncology [facility] Pager- [phone] 06/13/2017

## 2017-06-13 NOTE — Progress Notes (Signed)
Patient extubated on 5 liters nasal cannula. No complications, family is at bedside.

## 2017-06-14 ENCOUNTER — Ambulatory Visit: Payer: Medicare HMO

## 2017-06-14 ENCOUNTER — Inpatient Hospital Stay: Payer: Medicare HMO

## 2017-06-14 LAB — GLUCOSE, CAPILLARY
GLUCOSE-CAPILLARY: 139 mg/dL — AB (ref 65–99)
Glucose-Capillary: 114 mg/dL — ABNORMAL HIGH (ref 65–99)
Glucose-Capillary: 113 mg/dL — ABNORMAL HIGH (ref 65–99)

## 2017-06-14 MED ORDER — LORAZEPAM 0.5 MG PO TABS: 0.5000 mg | ORAL_TABLET | Freq: Three times a day (TID) | ORAL | 0 refills | Status: AC | PRN

## 2017-06-14 MED ORDER — MORPHINE SULFATE (PF) 2 MG/ML IV SOLN
1.0000 mg | INTRAVENOUS | Status: DC | PRN
  Administered 2017-06-14 (×2): 2 mg via INTRAVENOUS
  Filled 2017-06-14 (×2): qty 1

## 2017-06-14 MED ORDER — PROMETHAZINE HCL 25 MG RE SUPP: 25.0000 mg | Freq: Four times a day (QID) | RECTAL | 1 refills | Status: AC | PRN

## 2017-06-14 MED ORDER — MORPHINE SULFATE (CONCENTRATE) 20 MG/ML PO SOLN
10.0000 mg | ORAL | 0 refills | Status: AC | PRN
Start: 2017-06-14 — End: ?

## 2017-06-14 MED ORDER — CHLORHEXIDINE GLUCONATE 0.12% ORAL RINSE (MEDLINE KIT): 15.0000 mL | Freq: Two times a day (BID) | OROMUCOSAL | 0 refills | Status: AC

## 2017-06-14 MED ORDER — GLYCOPYRROLATE 1 MG PO TABS: 1.0000 mg | ORAL_TABLET | Freq: Three times a day (TID) | ORAL | 0 refills | Status: AC | PRN

## 2017-06-14 MED ORDER — DILTIAZEM HCL 30 MG PO TABS
30.0000 mg | ORAL_TABLET | Freq: Four times a day (QID) | ORAL | Status: DC
  Administered 2017-06-14: 30 mg via ORAL
  Filled 2017-06-14: qty 1

## 2017-06-14 MED ORDER — DILTIAZEM HCL 30 MG PO TABS: 30.0000 mg | ORAL_TABLET | Freq: Four times a day (QID) | ORAL | 1 refills | Status: AC

## 2017-06-14 MED ORDER — IPRATROPIUM-ALBUTEROL 0.5-2.5 (3) MG/3ML IN SOLN: 3.0000 mL | RESPIRATORY_TRACT | Status: DC | PRN

## 2017-06-14 NOTE — Progress Notes (Signed)
Request received for left Pleurx catheter placement in pt.  Imaging studies were reviewed by Dr.Henn.  Patient status post left thoracentesis on 2/26 with no significant change noted on CXR.  Patient appears to have central left bronchial obstruction and placing left chest drain will not significantly improve current clinical status. Case d/w Dr. Alva Garnet and decision made to cancel procedure. Plans noted for home hospice.

## 2017-06-14 NOTE — Progress Notes (Signed)
Pt had a 6 beat run of vtach, wide QRS.  Pt now on 5mg /hr of cardizem.  HR in the 70's, low 80's but remains afib.  NP notified of incident.  No new orders.  Pt to remain on 5mg /hr of cardizem.

## 2017-06-14 NOTE — Progress Notes (Signed)
   06/14/17 0305  Clinical Encounter Type  Visited With Family  Visit Type Follow-up  Spiritual Encounters  Spiritual Needs Emotional   Encountered patient son in hallway, offered emotional support.

## 2017-06-14 NOTE — Progress Notes (Signed)
Chaplain rounded ICU and was referred to Pt. Chaplain introduced himself.    06/14/17 1100  Clinical Encounter Type  Visited With Family  Visit Type Follow-up  Referral From Chaplain  Spiritual Encounters  Spiritual Needs Emotional

## 2017-06-14 NOTE — Progress Notes (Signed)
Patient is transitioning to full comfort care.  Orders have been placed for transfer.  After transfer, PCCM will sign off. Please call if we can be of further assistance    Merton Border, MD PCCM service Mobile [phone] Pager [phone] 06/14/2017 3:46 PM

## 2017-06-14 NOTE — Progress Notes (Signed)
New hospice home referral received from [place]. Patient is a 67 year old woman with a known history of lung cancer, new brain mets noted on MRI at last admission with plans for whole brain radiation. Admittted to [facility] on 2./25 following a fall at home. In the ED she was found to have a-fib with RVR and CXR revealed complete white out of left lung . On 2/26 she required intubation, extubated on 2/28 to nasal cannula. Palliative Medicine has been involved and met with patient and her family over the course of the last few days. She continues to require 6 liters of oxygen and PRN morphine and lorazepam for symptom management. Following a family meeting with Palliative Medicine NP Asencion Gowda decision was made to focus on comfort with transfer to the hospice home. Writer met in the family room with patient's son Heath Lark and daughter in law Kym, sister Alphonzo Dublin was conferenced in on speaker phone, to initiate education regarding hospice services, philosophy and team approach to care with understanding voiced. Questions answered, consents signed. Patient information faxed to referral, report called to the hospice home, EMS notified for transport. Hospital care team and family all updated. Signed DNR in place in patient's chart.  Flo Shanks RN, BSN, [place] and [facility] liaison [phone]

## 2017-06-14 NOTE — Progress Notes (Addendum)
LCSW received consult for hospice home. Referral given to [facility] is aware of patient. LCSW completed EMS information and placed in packet.  No other needs at this time.  MD has been paged regarding bed available and transfer patient to hospice home today.   Lane Hacker, MSW Clinical Social Work: [employer] Coverage for :  [phone]

## 2017-06-14 NOTE — Progress Notes (Addendum)
Daily Progress Note   Patient Name: Sheri Ayala       Date: 06/14/2017 DOB: 06/07/1950  Age: 67 y.o. MRN#: [id number] Attending Physician: Gladstone Lighter, MD Primary Care Physician: Alvester Chou, NP Admit Date: 06/10/2017  Reason for Consultation/Follow-up: Establishing goals of care  Subjective: Sheri Ayala is resting in bed. She is awake and speaking with son but confused. Breathing is somewhat more labored and discomfort with laying head of bed down. Son states she would want to do what was possible to give her more time even if it is days to a week. Diltiazem drip converted to oral medication as she is able to eat applesauce. Chest xray to see if fluid has re accumulated with plans for palliative thoracentesis and a drain for comfort if warranted. Plans for D/C with hospice.    Family states they do not want to take her home with hospice as there are only the 2 of them and they will not be able to manage her care. They would prefer the hospice facility in [place] as her father died there. They were very happy with her care.    MOST form: comfort care  Length of Stay: 4  Current Medications: Scheduled Meds:  . chlorhexidine gluconate (MEDLINE KIT)  15 mL Mouth Rinse BID  . diltiazem  30 mg Oral Q6H  . heparin  5,000 Units Subcutaneous Q8H  . mouth rinse  15 mL Mouth Rinse 10 times per day  . metoprolol tartrate  100 mg Oral BID    Continuous Infusions:   PRN Meds: docusate, fentaNYL (SUBLIMAZE) injection, glycopyrrolate, ipratropium-albuterol, LORazepam, metoprolol tartrate, morphine injection  Physical Exam  Constitutional: No distress.  Pulmonary/Chest:  Some work of breathing noted.   Mier in place  Neurological: She is alert.  Speaking with family.   Skin: Skin is warm  and dry.            Vital Signs: BP 117/71   Pulse 90   Temp 98.7 F (37.1 C) (Axillary)   Resp 15   Ht '5\' 9"'$  (1.753 m)   Wt 61.6 kg (135 lb 12.9 oz)   SpO2 92%   BMI 20.05 kg/m  SpO2: SpO2: 92 % O2 Device: O2 Device: Nasal Cannula O2 Flow Rate: O2 Flow Rate (L/min): 6 L/min  Intake/output summary:  Intake/Output Summary (Last 24 hours) at 06/14/2017 1037 Last data filed at 06/14/2017 0656 Gross per 24 hour  Intake 1330.02 ml  Output 700 ml  Net 630.02 ml   LBM: Last BM Date: 06/11/17 Baseline Weight: Weight: 54.4 kg (120 lb) Most recent weight: Weight: 61.6 kg (135 lb 12.9 oz)       Palliative Assessment/Data:10%.      Patient Active Problem List   Diagnosis Date Noted  . Fall at home, initial encounter 06/10/2017  . Atrial fibrillation with RVR ([place]) 06/10/2017  . Pneumonia 06/10/2017  . Acute on chronic respiratory failure ([place]) 05/24/2017  . Brain metastases ([place]) 05/24/2017  . PICC (peripherally inserted central catheter) flush 09/05/2016  . Port catheter in place 08/15/2016  . Small cell lung cancer in adult ([place]) 07/25/2016  . Lobar pneumonia ([place]) 07/20/2016  . Hilar adenopathy 07/20/2016  . Community acquired pneumonia 07/16/2016  . Leukocytosis 07/16/2016  . Obstructive atelectasis 07/15/2016  . Pleural effusion   . S/P thoracentesis   . Pleural effusion on right   . History of CVA (cerebrovascular accident) 05/01/2016  . HCAP (healthcare-associated pneumonia) 04/30/2016  . Chronic a-fib ([place]) 04/30/2016  . Severe protein-calorie malnutrition ([place])   . Mass of left lung 04/10/2016  . Acute on chronic respiratory failure with hypoxia ([place]) 01/30/2015  . Right lower lobe pneumonia ([place]) 01/30/2015  . SOB (shortness of breath) 12/24/2012  . COPD with acute exacerbation ([place]) 12/22/2012  . Headache in front of head 03/27/2011  . Thrombocytopenia ([place]) 03/27/2011  . CLL (chronic lymphocytic leukemia) ([place]) 03/09/2011  . Cigarette smoker 03/09/2011      Palliative Care Assessment & Plan   Patient Profile: Sheri Ayala a66 y.o.femalewith a known history of anxiety, atrial fibrillation, bronchospasm, cerebrovascular accident, COPD, depression, CLL, small cell lung cancer in remission, recently diagnosed metastasis and brain-was admitted to hospital 2 weeks ago with pneumonia and at that time because of headache her CT scan and MRI of the brain was done which diagnosed a new metastasis in the brain.   Assessment/ Recommendations/Plan:  Extubated. Alert and oriented, spending time with family.   Goals of Care and Additional Recommendations:  Continue extubated current level of care. If patient declines, transition to comfort care.   Code Status:    Code Status Orders  (From admission, onward)        Start     Ordered   06/12/17 1457  Do not attempt resuscitation (DNR)  Continuous    Question Answer Comment  In the event of cardiac or respiratory ARREST Do not call a "code blue"   In the event of cardiac or respiratory ARREST Do not perform Intubation, CPR, defibrillation or ACLS   In the event of cardiac or respiratory ARREST Use medication by any route, position, wound care, and other measures to relive pain and suffering. May use oxygen, suction and manual treatment of airway obstruction as needed for comfort.      06/12/17 1456    Code Status History    Date Active Date Inactive Code Status Order ID Comments User Context   06/10/2017 16:44 06/12/2017 14:56 Full Code [id number]  Vaughan Basta, MD Inpatient   05/24/2017 13:12 05/27/2017 15:06 DNR [id number]  Vaughan Basta, MD ED   07/15/2016 20:20 07/27/2016 18:40 Full Code [id number]  Vianne Bulls, MD ED   04/30/2016 20:16 05/07/2016 18:25 Full Code [id number]  Barton Dubois, MD Inpatient   04/18/2016 10:37 04/19/2016 16:02 Full Code [id number]  Erick Colace, NP Inpatient  01/29/2015 20:59 01/31/2015 17:21 Full Code [id number]  Henreitta Leber, MD Inpatient    12/22/2012 21:21 12/25/2012 17:35 Full Code [id number]  Rise Patience, MD Inpatient   03/27/2011 17:44 03/29/2011 17:01 Full Code [id number]  Rexene Alberts, MD ED   03/09/2011 23:04 03/12/2011 17:41 Full Code [id number]  Ron Parker, RN Inpatient    Advance Directive Documentation     Most Recent Value  Type of Advance Directive  Healthcare Power of Attorney, Living will  Pre-existing out of facility DNR order (yellow form or pink MOST form)  No data  "MOST" Form in Place?  No data       Prognosis:  Poor. Lung cancer, brain mets. Poor PO intake.   Discharge Planning:  Recommend D/C to hospice facility.  Care plan was discussed with CCM  Thank you for allowing the Palliative Medicine Team to assist in the care of this patient.   Time In: 9:30 Time Out: 11:00 Total Time 1hours 30 min Prolonged Time Billed  yes      Greater than 50%  of this time was spent counseling and coordinating care related to the above assessment and plan.  Asencion Gowda, NP  Please contact Palliative Medicine Team phone at [phone] for questions and concerns.

## 2017-06-14 NOTE — Discharge Summary (Signed)
[facility] [place] NAME: Sheri Ayala    MR#:  [id number]  DATE OF BIRTH:  08/25/1950  DATE OF ADMISSION:  06/10/2017   ADMITTING PHYSICIAN: Vaughan Basta, MD  DATE OF DISCHARGE: 06/14/17  PRIMARY CARE PHYSICIAN: Alvester Chou, NP   ADMISSION DIAGNOSIS:   Hyperkalemia [E87.5] Pleural effusion [J90] COPD exacerbation (HCC) [J44.1] Atrial fibrillation with RVR ([place]) [I48.91] HCAP (healthcare-associated pneumonia) [J18.9] Sepsis, due to unspecified organism ([place]) [A41.9]  DISCHARGE DIAGNOSIS:   Active Problems:   Pleural effusion   Small cell lung cancer in adult [facility])   Acute on chronic respiratory failure ([place])   Fall at home, initial encounter   Atrial fibrillation with RVR ([place])   Pneumonia   SECONDARY DIAGNOSIS:   Past Medical History:  Diagnosis Date  . Allergy   . 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 Anxiety   . Asthma   . Atrial fibrillation ([place]) 04/2016  . Brain metastases ([place]) 2019  . Bronchospasm   . Cerebrovascular accident (CVA) ([place]) 08/28/2015  . Chronic lymphocytic leukemia ([place]) 2004  . COPD (chronic obstructive pulmonary disease) ([place])   . Depression   . Emphysema of lung ([place])   . Frontal sinusitis November 2012  . H/O viral encephalitis   . Headache(784.0)   . Heart murmur   . Mitral prolapse 11/24/2015  . Mitral regurgitation 11/24/2015  . Olecranon bursitis of left elbow November 2012.   in past  . Pleurisy   . Pneumonia    13 times  . Rhinitis   . Small cell lung cancer ([place])   . Thrombocytopenia ([place])   . Tobacco abuse   . Tricuspid valve regurgitation 11/24/2015    HOSPITAL COURSE:    67 year old female with past medical history significant for small cell lung cancer, COPD on 2 L home oxygen chronically, mild CLL, recent diagnosis of left cerebellar metastatic brain lesion on Decadron, A. fib presents to the hospital secondary to worsening  shortness of breath.  1. Acute hypoxic respiratory failure-secondary to left-sided pleural effusion and white out of left lung, recurrence of small cell lung cancer.large mass behind left main bronchus -Initially admitted to the floor on nasal cannula. then went into acute respiratory distress and intubated.  -s/p  thoracentesis on the left side with no significant improvement in CXR. Cardio thoracic surgery consulted - but did not recommend any further measures. -Patient was extubated after terminal wean and presence of family. She is on 5-6 L of oxygen this morning. Very dyspneic still and appears confused. Son at bedside mentioned that they are waiting for more family members to come in and see the patient. Agreeable for hospice discharge. -She received IV cefepime in the hospital- -Continue antibiotics with cefepime - overall poor prognosis from rapidly progressing recurrent small cell lung cancer-  2. A. fib with RVR- worsened with her respiratory condition. Rate still elevated. Appreciate cardiology consult. -Was on Cardizem drip while in the ICU. We will discharge on oral Cardizem for comfort from tachycardia.  3. Metastatic brain metastases-noted to have left inferior cerebellar mass based on the MRI  diagnosed recently. Hasn't started on any radiation.  4. CLL with extreme leukocytosis on admission-likely secondary to recent steroid use and reactive leukocytosis.   Overall poor prognosis,  discussed with son at bedside this morning -Appreciate palliative care consult and oncology consults -Plan is to discharge to hospice home.   DISCHARGE CONDITIONS:   Critical  CONSULTS OBTAINED:   Treatment Team:  Corey Skains, MD Earlie Server, MD Flora Lipps, MD [facility], [facility], MD  DRUG ALLERGIES:   Allergies  Allergen Reactions  . Eliquis [Apixaban] Rash    Hives, and difficulty swallowing  . Cardizem Cd [Diltiazem Hcl Er Beads] Rash  . Citrus Other (See Comments)     Burning in stomach   . Penicillins Hives, Nausea And Vomiting, Swelling and Other (See Comments)    Patient reports tolerating ampicillin.  Has patient had a PCN reaction causing immediate rash, facial/tongue/throat swelling, SOB or lightheadedness with hypotension: Yes Has patient had a PCN reaction causing severe rash involving mucus membranes or skin necrosis: unknown Has patient had a PCN reaction that required hospitalization No Has patient had a PCN reaction occurring within the last 10 years: No If all of the above answers are "NO", then may proceed with Cephalosporin use.    DISCHARGE MEDICATIONS:   Allergies as of 06/14/2017      Reactions   Eliquis [apixaban] Rash   Hives, and difficulty swallowing   Cardizem Cd [diltiazem Hcl Er Beads] Rash   Citrus Other (See Comments)   Burning in stomach    Penicillins Hives, Nausea And Vomiting, Swelling, Other (See Comments)   Patient reports tolerating ampicillin. Has patient had a PCN reaction causing immediate rash, facial/tongue/throat swelling, SOB or lightheadedness with hypotension: Yes Has patient had a PCN reaction causing severe rash involving mucus membranes or skin necrosis: unknown Has patient had a PCN reaction that required hospitalization No Has patient had a PCN reaction occurring within the last 10 years: No If all of the above answers are "NO", then may proceed with Cephalosporin use.      Medication List    STOP taking these medications   albuterol (2.5 MG/3ML) 0.083% nebulizer solution Commonly known as:  PROVENTIL   ALPRAZolam 0.5 MG tablet Commonly known as:  XANAX   budesonide-formoterol 160-4.5 MCG/ACT inhaler Commonly known as:  SYMBICORT   clotrimazole 10 MG troche Commonly known as:  MYCELEX   dexamethasone 4 MG tablet Commonly known as:  DECADRON   feeding supplement (ENSURE ENLIVE) Liqd   fluconazole 100 MG tablet Commonly known as:  DIFLUCAN   HYDROcodone-acetaminophen 5-325 MG  tablet Commonly known as:  NORCO   pantoprazole 40 MG tablet Commonly known as:  PROTONIX   polyethylene glycol packet Commonly known as:  MIRALAX / GLYCOLAX   prochlorperazine 10 MG tablet Commonly known as:  COMPAZINE   rizatriptan 10 MG tablet Commonly known as:  MAXALT   sertraline 25 MG tablet Commonly known as:  ZOLOFT   temazepam 30 MG capsule Commonly known as:  RESTORIL   topiramate 50 MG tablet Commonly known as:  TOPAMAX   VENTOLIN HFA 108 (90 Base) MCG/ACT inhaler Generic drug:  albuterol     TAKE these medications   chlorhexidine gluconate (MEDLINE KIT) 0.12 % solution Commonly known as:  PERIDEX 15 mLs by Mouth Rinse route 2 (two) times daily.   diltiazem 30 MG tablet Commonly known as:  CARDIZEM Take 1 tablet (30 mg total) by mouth every 6 (six) hours.   glycopyrrolate 1 MG tablet Commonly known as:  ROBINUL Take 1 tablet (1 mg total) by mouth 3 (three) times daily as needed (for secretions).   LORazepam 0.5 MG tablet Commonly known as:  ATIVAN Take 1 tablet (0.5 mg total) by mouth every 8 (eight) hours as needed for anxiety.   morphine 20 MG/ML concentrated solution Commonly known as:  ROXANOL Take 0.5 mLs (10 mg total) by mouth every 2 (two) hours as needed for moderate pain, severe pain or shortness of breath.   OXYGEN Inhale 2 L into the lungs as needed (for shortness of breath).   promethazine 25 MG suppository Commonly known as:  PHENERGAN Place 1 suppository (25 mg total) rectally every 6 (six) hours as needed for nausea.        DISCHARGE INSTRUCTIONS:   Will be discharged to hospice home  DIET:   Low-sodium diet  ACTIVITY:   As tolerated  OXYGEN:   Home Oxygen: Yes Oxygen Delivery: 5 liters/min via Patient connected to nasal cannula oxygen  DISCHARGE LOCATION:   Hospice home  If you experience worsening of your admission symptoms, develop shortness of breath, life threatening emergency, suicidal or homicidal  thoughts you must seek medical attention immediately by calling 911 or calling your MD immediately  if symptoms less severe.  You Must read complete instructions/literature along with all the possible adverse reactions/side effects for all the Medicines you take and that have been prescribed to you. Take any new Medicines after you have completely understood and accpet all the possible adverse reactions/side effects.   Please note  You were cared for by a hospitalist during your hospital stay. If you have any questions about your discharge medications or the care you received while you were in the hospital after you are discharged, you can call the unit and asked to speak with the hospitalist on call if the hospitalist that took care of you is not available. Once you are discharged, your primary care physician will handle any further medical issues. Please note that NO REFILLS for any discharge medications will be authorized once you are discharged, as it is imperative that you return to your primary care physician (or establish a relationship with a primary care physician if you do not have one) for your aftercare needs so that they can reassess your need for medications and monitor your lab values.    On the day of Discharge:  VITAL SIGNS:   Blood pressure 117/71, pulse 90, temperature 98.7 F (37.1 C), temperature source Axillary, resp. rate 15, height 5' 9" (1.753 m), weight 61.6 kg (135 lb 12.9 oz), SpO2 92 %.  PHYSICAL EXAMINATION:    GENERAL:  67 y.o.-year-old patient lying in the bed, very ill appearing. EYES: Pupils equal, round, reactive to light and accommodation. No scleral icterus. Extraocular muscles intact.  HEENT: Head atraumatic, normocephalic. Oropharynx and nasopharynx clear.  NECK:  Supple, no jugular venous distention. No thyroid enlargement, no tenderness.  LUNGS: Appears dyspneic. Moving air bilaterally but significantly decreased on the left side. no wheezing,  rales,rhonchi or crepitation. No use of accessory muscles of respiration.  CARDIOVASCULAR: S1, S2 normal. Rapid and irregular. No murmurs, rubs, or gallops.  ABDOMEN: Soft, non-tender, non-distended. Bowel sounds present. No organomegaly or mass.  EXTREMITIES: No pedal edema, cyanosis, or clubbing.  NEUROLOGIC: Cranial nerves intact, able to move all 4 extremities but globally weak. Sensation is intact. PSYCHIATRIC: The patient is alert but not completely oriented SKIN: No obvious rash, lesion, or ulcer.   DATA REVIEW:   CBC Recent Labs  Lab 06/12/17 0643  WBC 38.3*  HGB 12.7  HCT 41.2  PLT 136*    Chemistries  Recent Labs  Lab 06/10/17 1214  06/12/17 0643  NA 132*   < > 140  K 5.7*   < > 4.5  CL 89*   < > 100*  CO2  33*   < > 31  GLUCOSE 172*   < > 175*  BUN 53*   < > 48*  CREATININE 0.78   < > 0.71  CALCIUM 8.8*   < > 8.5*  MG 2.2   < > 2.1  AST 48*  --   --   ALT 84*  --   --   ALKPHOS 85  --   --   BILITOT 0.8  --   --    < > = values in this interval not displayed.     Microbiology Results  Results for orders placed or performed during the hospital encounter of 06/10/17  Blood Culture (routine x 2)     Status: None (Preliminary result)   Collection Time: 06/10/17 12:14 PM  Result Value Ref Range Status   Specimen Description BLOOD RIGHT [facility]  Final   Special Requests   Final    BOTTLES DRAWN AEROBIC AND ANAEROBIC Blood Culture adequate volume   Culture   Final    NO GROWTH 4 DAYS Performed at [facility], [address] [place], [place] [address]    Report Status PENDING  Incomplete  Blood Culture (routine x 2)     Status: None (Preliminary result)   Collection Time: 06/10/17 12:15 PM  Result Value Ref Range Status   Specimen Description BLOOD BLOOD LEFT WRIST  Final   Special Requests   Final    BOTTLES DRAWN AEROBIC AND ANAEROBIC Blood Culture adequate volume   Culture   Final    NO GROWTH 4 DAYS Performed at [facility],  [address] [place], [place] [address]    Report Status PENDING  Incomplete  MRSA PCR Screening     Status: None   Collection Time: 06/10/17  5:01 PM  Result Value Ref Range Status   MRSA by PCR NEGATIVE NEGATIVE Final    Comment:        The GeneXpert MRSA Assay (FDA approved for NASAL specimens only), is one component of a comprehensive MRSA colonization surveillance program. It is not intended to diagnose MRSA infection nor to guide or monitor treatment for MRSA infections. Performed at [facility], [address] [place], [place] [address]   Culture, respiratory (NON-Expectorated)     Status: None   Collection Time: 06/11/17  4:18 AM  Result Value Ref Range Status   Specimen Description   Final    PLEURAL Performed at [facility], [address] [place], [place] [address]    Special Requests   Final    NONE Performed at [facility], [address] [place], [place] [address]    Gram Stain   Final    FEW WBC PRESENT, PREDOMINANTLY PMN NO ORGANISMS SEEN    Culture   Final    RARE Consistent with normal respiratory flora. Performed at [facility], [place] [address] [place], [place] [address]    Report Status 06/13/2017 FINAL  Final  Gram stain     Status: None   Collection Time: 06/11/17 12:03 PM  Result Value Ref Range Status   Specimen Description PLEURAL  Final   Special Requests NONE  Final   Gram Stain   Final    FEW WBC NO ORGANISMS SEEN Performed at [facility], [address] [place], [place] [address]    Report Status 06/11/2017 FINAL  Final    RADIOLOGY:  Dg Chest Port 1 View  Result Date: 06/14/2017 CLINICAL DATA:  Acute respiratory failure. EXAM: PORTABLE CHEST 1 VIEW COMPARISON:  06/11/2017 FINDINGS: Since the prior study, the endotracheal and nasal/orogastric tubes have been removed. Left hemithorax remains completely opacified. Right lung is hyperexpanded but clear. No convincing right  pleural effusion. No right pneumothorax. Left hemithorax opacifications obscures the left hilum and left heart border. No gross mediastinal or right hilar masses. Skeletal structures are intact. IMPRESSION: 1. Complete opacification of left hemithorax is similar to the earlier study. 2. No new abnormalities. Right lung is hyperexpanded suggesting COPD, but clear. 3. Status post extubation. Electronically Signed   By: Lajean Manes M.D.   On: 06/14/2017 11:12     Management plans discussed with the patient, family and they are in agreement.  CODE STATUS:     Code Status Orders  (From admission, onward)        Start     Ordered   06/12/17 1457  Do not attempt resuscitation (DNR)  Continuous    Question Answer Comment  In the event of cardiac or respiratory ARREST Do not call a "code blue"   In the event of cardiac or respiratory ARREST Do not perform Intubation, CPR, defibrillation or ACLS   In the event of cardiac or respiratory ARREST Use medication by any route, position, wound care, and other measures to relive pain and suffering. May use oxygen, suction and manual treatment of airway obstruction as needed for comfort.      06/12/17 1456    Code Status History    Date Active Date Inactive Code Status Order ID Comments User Context   06/10/2017 16:44 06/12/2017 14:56 Full Code [id number]  Vaughan Basta, MD Inpatient   05/24/2017 13:12 05/27/2017 15:06 DNR [id number]  Vaughan Basta, MD ED   07/15/2016 20:20 07/27/2016 18:40 Full Code [id number]  Vianne Bulls, MD ED   04/30/2016 20:16 05/07/2016 18:25 Full Code [id number]  Barton Dubois, MD Inpatient   04/18/2016 10:37 04/19/2016 16:02 Full Code [id number]  Erick Colace, NP Inpatient   01/29/2015 20:59 01/31/2015 17:21 Full Code [id number]  Henreitta Leber, MD Inpatient   12/22/2012 21:21 12/25/2012 17:35 Full Code [id number]  Rise Patience, MD Inpatient   03/27/2011 17:44 03/29/2011 17:01 Full Code [id number]  Rexene Alberts,  MD ED   03/09/2011 23:04 03/12/2011 17:41 Full Code [id number]  Ron Parker, RN Inpatient    Advance Directive Documentation     Most Recent Value  Type of Advance Directive  Healthcare Power of Attorney, Living will  Pre-existing out of facility DNR order (yellow form or pink MOST form)  No data  "MOST" Form in Place?  No data      TOTAL TIME TAKING CARE OF THIS PATIENT: 38 minutes.    Gladstone Lighter M.D on 06/14/2017 at 1:36 PM  Between 7am to 6pm - Pager - [phone]  After 6pm go to [email or web address] - Proofreader  [facility]  Office  [phone]  CC: Primary care physician; Alvester Chou, NP   Note: This dictation was prepared with Dragon dictation along with smaller phrase technology. Any transcriptional errors that result from this process are unintentional.

## 2017-06-15 LAB — CULTURE, BLOOD (ROUTINE X 2)
CULTURE: NO GROWTH
Culture: NO GROWTH
Special Requests: ADEQUATE
Special Requests: ADEQUATE

## 2017-06-17 ENCOUNTER — Other Ambulatory Visit: Payer: Self-pay

## 2017-06-17 ENCOUNTER — Ambulatory Visit: Payer: Medicare HMO

## 2017-06-18 ENCOUNTER — Inpatient Hospital Stay: Payer: Medicare HMO | Attending: Radiation Oncology

## 2017-06-18 ENCOUNTER — Ambulatory Visit: Payer: Medicare HMO

## 2017-06-19 ENCOUNTER — Ambulatory Visit: Payer: Medicare HMO | Admitting: Adult Health

## 2017-06-19 DIAGNOSIS — R0989 Other specified symptoms and signs involving the circulatory and respiratory systems: Secondary | ICD-10-CM

## 2017-06-19 NOTE — Progress Notes (Deleted)
Cardiology Office Note   Date:  06/19/2017   ID:  Ioma I Yackley, DOB [birth year], MRN [id number]  PCP:  Alvester Chou, NP  Cardiologist:   No chief complaint on file.    History of Present Illness: Sheri Ayala is a 67 y.o. female who presents for posthospitalization follow-up after admission for community-acquired pneumonia, requiring intubation, atrial fibrillation with RVR without evidence of myocardial infarction or congestive heart failure.  Other history includes small cell lung cancer, COPD on 2 L of oxygen chronically, mild CLL, and recent diagnosis of left cerebellar metastatic brain lesion.  During hospitalization the patient was found to have a left-sided pleural effusion and white out of the left lung, recurrence of small cell lung cancer with large mass behind left main bronchus.  She required thoracentesis on the left with no significant improvement in chest x-ray.  Subsequently after extubation, the patient family made the decision to contact hospice.  She was continued on IV antibiotics in the setting of pneumonia.  The patient was discharged on oral Cardizem for heart rate control.  She was not placed on anticoagulation.    Past Medical History:  Diagnosis Date  . Allergy   . 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 Anxiety   . Asthma   . Atrial fibrillation ([place]) 04/2016  . Brain metastases ([place]) 2019  . Bronchospasm   . Cerebrovascular accident (CVA) ([place]) 08/28/2015  . Chronic lymphocytic leukemia ([place]) 2004  . COPD (chronic obstructive pulmonary disease) ([place])   . Depression   . Emphysema of lung ([place])   . Frontal sinusitis November 2012  . H/O viral encephalitis   . Headache(784.0)   . Heart murmur   . Mitral prolapse 11/24/2015  . Mitral regurgitation 11/24/2015  . Olecranon bursitis of left elbow November 2012.   in past  . Pleurisy   . Pneumonia    13 times  . Rhinitis   . Small cell lung cancer ([place])   . Thrombocytopenia ([place])     . Tobacco abuse   . Tricuspid valve regurgitation 11/24/2015    Past Surgical History:  Procedure Laterality Date  . ABDOMINAL HYSTERECTOMY    . COLONOSCOPY    . UPPER GASTROINTESTINAL ENDOSCOPY    . VIDEO BRONCHOSCOPY Bilateral 04/18/2016   Procedure: VIDEO BRONCHOSCOPY WITHOUT FLUORO;  Surgeon: Tanda Rockers, MD;  Location: [facility] ENDOSCOPY;  Service: Cardiopulmonary;  Laterality: Bilateral;  . VIDEO BRONCHOSCOPY WITH ENDOBRONCHIAL ULTRASOUND N/A 07/23/2016   Procedure: VIDEO BRONCHOSCOPY WITH ENDOBRONCHIAL ULTRASOUND;  Surgeon: Grace Isaac, MD;  Location: [facility] OR;  Service: Thoracic;  Laterality: N/A;     Current Outpatient Medications  Medication Sig Dispense Refill  . chlorhexidine gluconate, MEDLINE KIT, (PERIDEX) 0.12 % solution 15 mLs by Mouth Rinse route 2 (two) times daily. 120 mL 0  . diltiazem (CARDIZEM) 30 MG tablet Take 1 tablet (30 mg total) by mouth every 6 (six) hours. 30 tablet 1  . glycopyrrolate (ROBINUL) 1 MG tablet Take 1 tablet (1 mg total) by mouth 3 (three) times daily as needed (for secretions). 60 tablet 0  . LORazepam (ATIVAN) 0.5 MG tablet Take 1 tablet (0.5 mg total) by mouth every 8 (eight) hours as needed for anxiety. 30 tablet 0  . morphine (ROXANOL) 20 MG/ML concentrated solution Take 0.5 mLs (10 mg total) by mouth every 2 (two) hours as needed for moderate pain, severe pain or shortness of breath. 30 mL 0  . OXYGEN Inhale 2 L  into the lungs as needed (for shortness of breath).     . promethazine (PHENERGAN) 25 MG suppository Place 1 suppository (25 mg total) rectally every 6 (six) hours as needed for nausea. 12 suppository 1   No current facility-administered medications for this visit.     Allergies:   Eliquis [apixaban]; Cardizem cd [diltiazem hcl er beads]; Citrus; and Penicillins    Social History:  The patient  reports that she has quit smoking. She smoked 0.00 packs per day for 45.00 years. she has never used smokeless tobacco. She reports that  she does not drink alcohol or use drugs.   Family History:  The patient's family history includes Aneurysm in her mother; Congestive Heart Failure in her brother; Heart disease in her brother; Liver disease in her father.    ROS: All other systems are reviewed and negative. Unless otherwise mentioned in H&P    PHYSICAL EXAM: VS:  There were no vitals taken for this visit. , BMI There is no height or weight on file to calculate BMI. GEN: Well nourished, well developed, in no acute distress HEENT: normal Neck: no JVD, carotid bruits, or masses Cardiac: ***RRR; no murmurs, rubs, or gallops,no edema  Respiratory:  clear to auscultation bilaterally, normal work of breathing GI: soft, nontender, nondistended, + BS MS: no deformity or atrophy Skin: warm and dry, no rash Neuro:  Strength and sensation are intact Psych: euthymic mood, full affect   EKG:  EKG {ACTION; IS/IS DTO:[id number]} ordered today. The ekg ordered today demonstrates ***   Recent Labs: 05/24/2017: B Natriuretic Peptide 227.0 06/10/2017: ALT 84; TSH 0.458 06/12/2017: BUN 48; Creatinine, Ser 0.71; Hemoglobin 12.7; Magnesium 2.1; Platelets 136; Potassium 4.5; Sodium 140    Lipid Panel    Component Value Date/Time   CHOL 107 05/03/2016 1403      Wt Readings from Last 3 Encounters:  06/14/17 135 lb 12.9 oz (61.6 kg)  06/04/17 128 lb 8 oz (58.3 kg)  05/24/17 132 lb 4.4 oz (60 kg)      Other studies Reviewed: Additional studies/ records that were reviewed today include: ***. Review of the above records demonstrates: ***   ASSESSMENT AND PLAN:  1.  ***   Current medicines are reviewed at length with the patient today.    Labs/ tests ordered today include: *** Phill Myron. [place] Pugh, ANP, AACC   06/19/2017 7:46 AM    [facility] [address] [name], [place] [address] Phone: [phone]; Fax: [phone]

## 2017-06-25 ENCOUNTER — Inpatient Hospital Stay: Payer: Medicare HMO

## 2017-06-27 ENCOUNTER — Ambulatory Visit: Payer: Medicare HMO | Admitting: Nurse Practitioner

## 2017-06-27 ENCOUNTER — Other Ambulatory Visit: Payer: Medicare HMO

## 2017-07-15 DEATH — deceased

## 2017-08-09 ENCOUNTER — Ambulatory Visit: Payer: Medicare HMO | Admitting: Oncology

## 2017-08-09 ENCOUNTER — Other Ambulatory Visit: Payer: Medicare HMO

## 2017-12-05 ENCOUNTER — Telehealth: Payer: Self-pay | Admitting: Oncology

## 2017-12-05 NOTE — Telephone Encounter (Signed)
Faxed medical records to [facility], Release ID: [id number]

## 2018-04-07 IMAGING — DX DG CHEST 1V PORT
2 series · 2 of 2 positions shown · non-contrast
Comparison: CT 03/27/2016.

CLINICAL DATA: Bronchoscopy.  Left perihilar mass

EXAM:
PORTABLE CHEST 1 VIEW

[chest ap (1 of 2)]
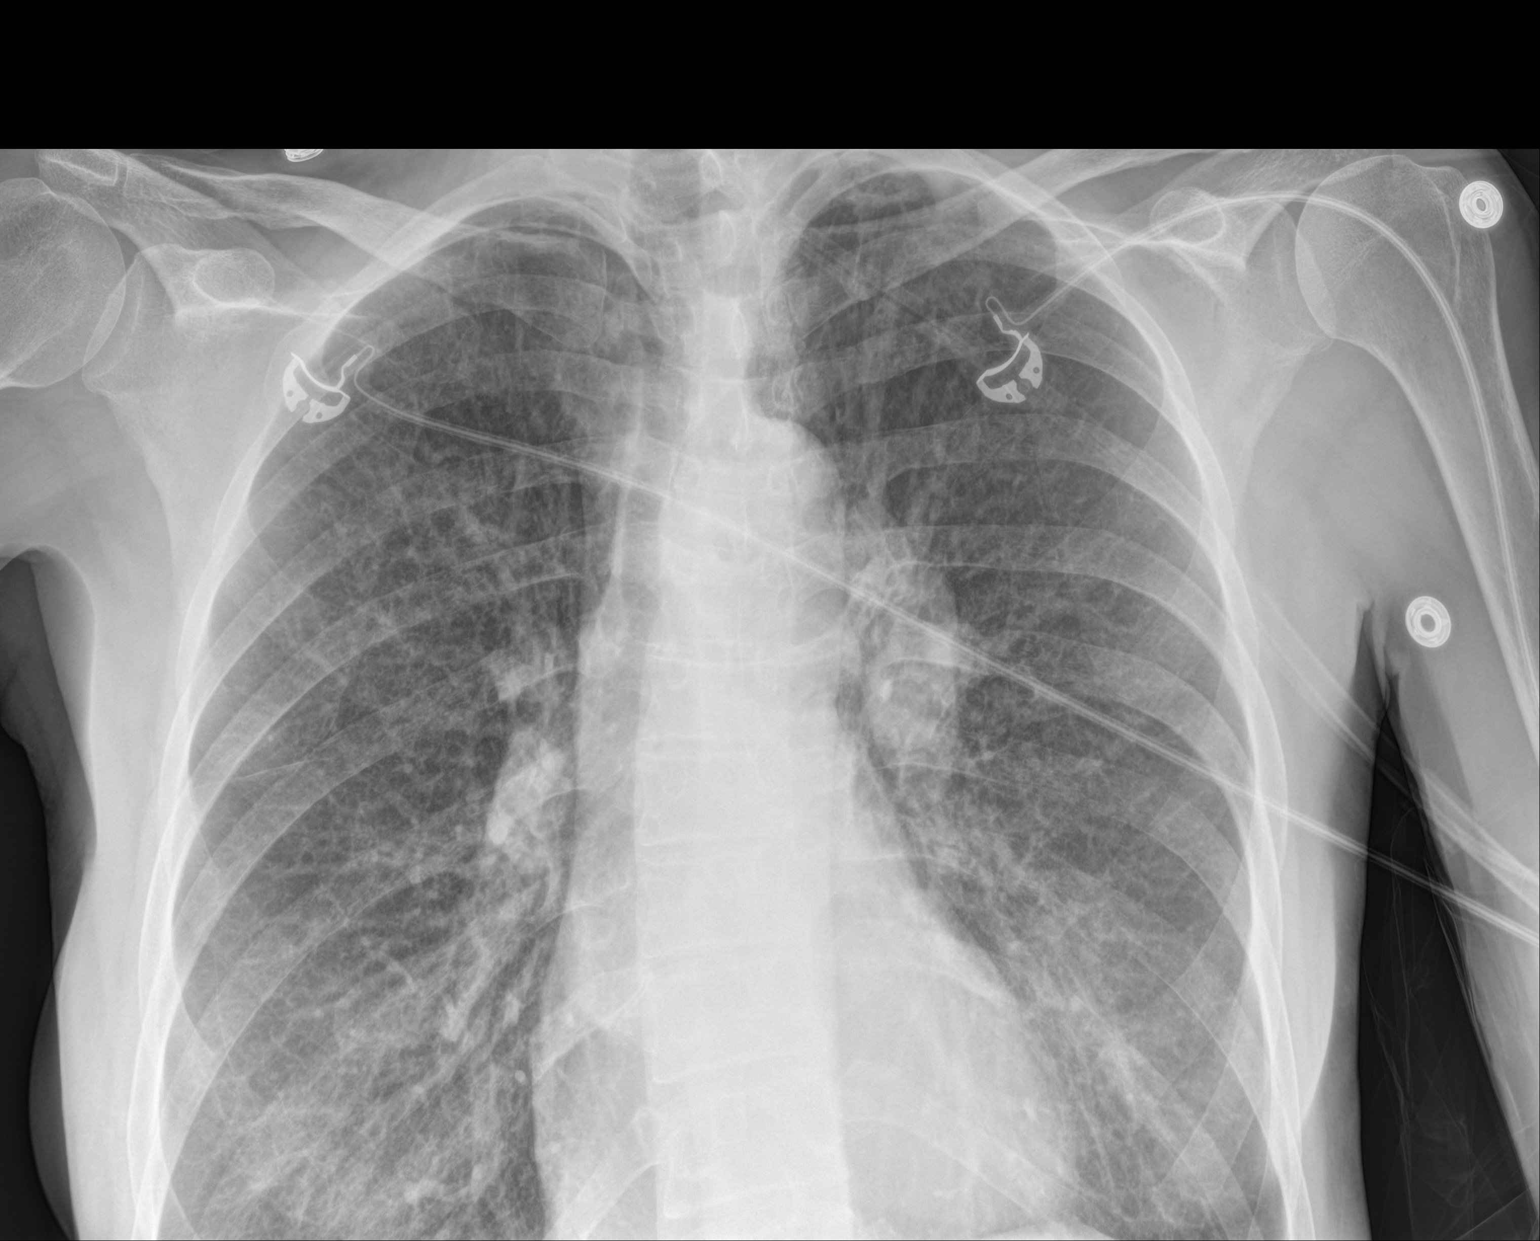

[chest ap (2 of 2)]
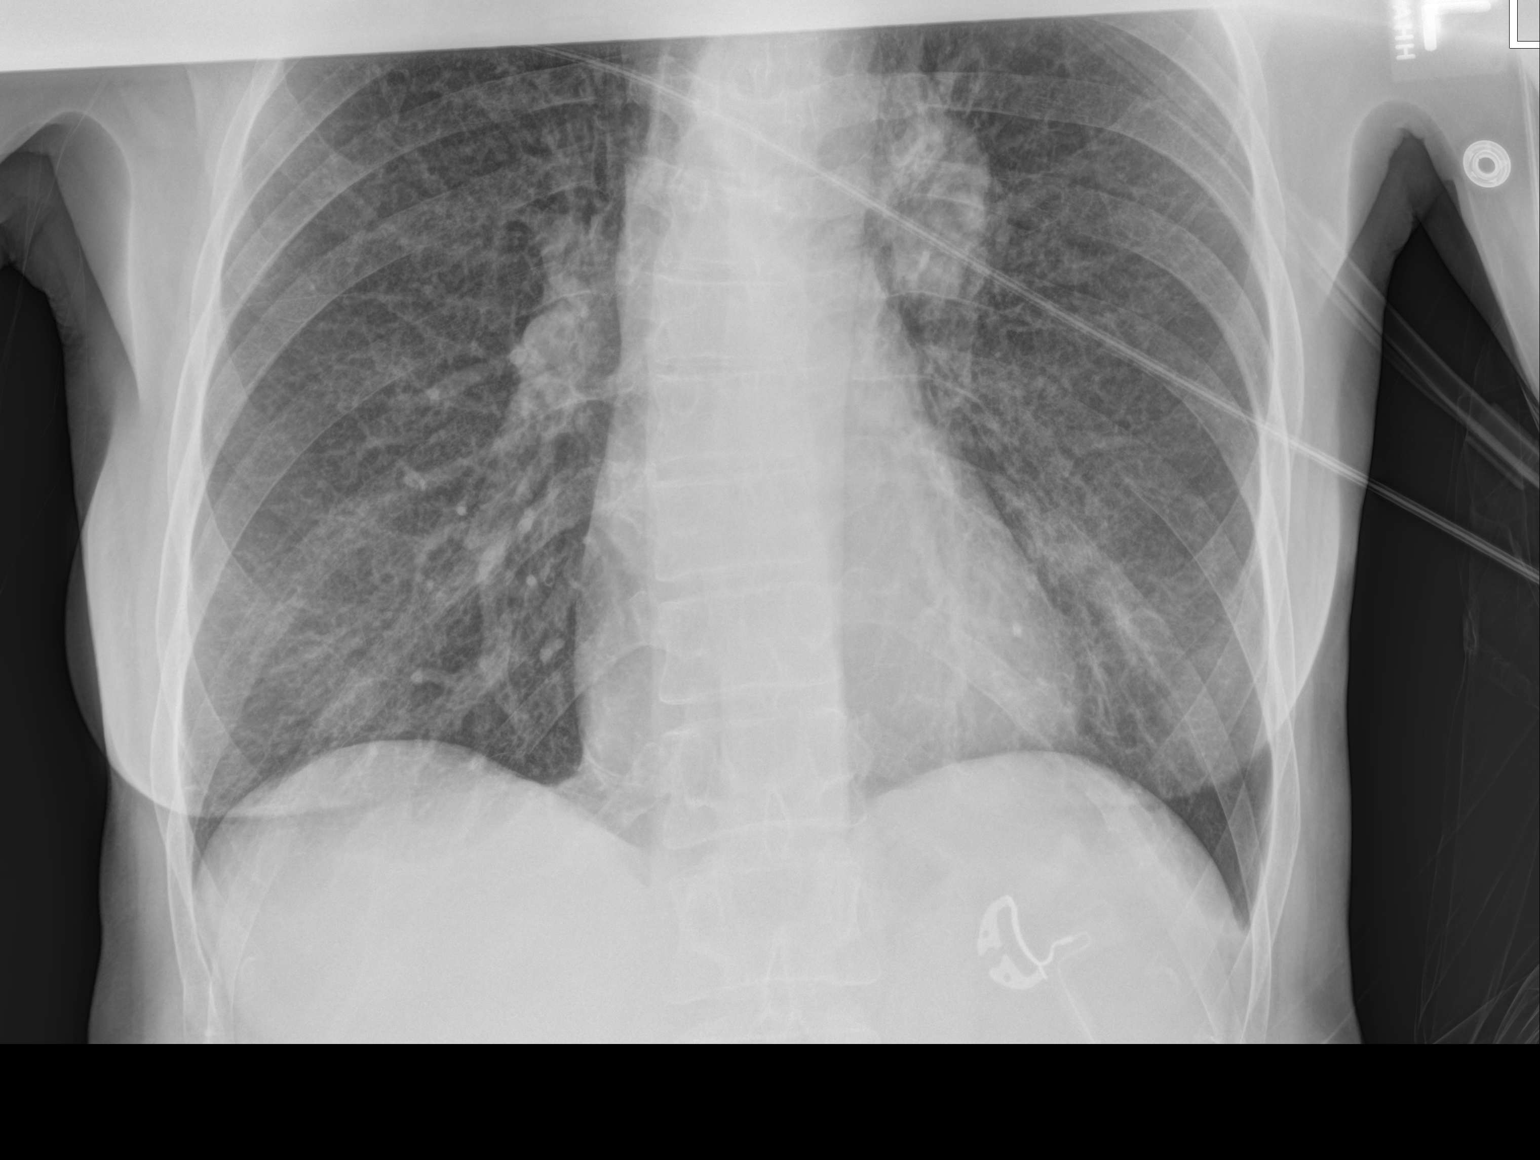

[2 of 2 positions shown; findings below may reference images not displayed]

FINDINGS: Mediastinum is stable. Persistent left perihilar fullness consistent
with previously identified mass. Lungs are otherwise clear. No
pleural effusion or pneumothorax. Biapical pleural thickening noted
consistent with scarring.
IMPRESSION: Persistent left perihilar fullness consistent with previously
identified perihilar mass.

## 2018-07-04 IMAGING — DX DG CHEST 2V
2 series · 2 of 2 positions shown · non-contrast
Comparison: 05/07/2016

CLINICAL DATA: Shortness breath, cough for 1 week

EXAM:
CHEST  2 VIEW

[chest pa]
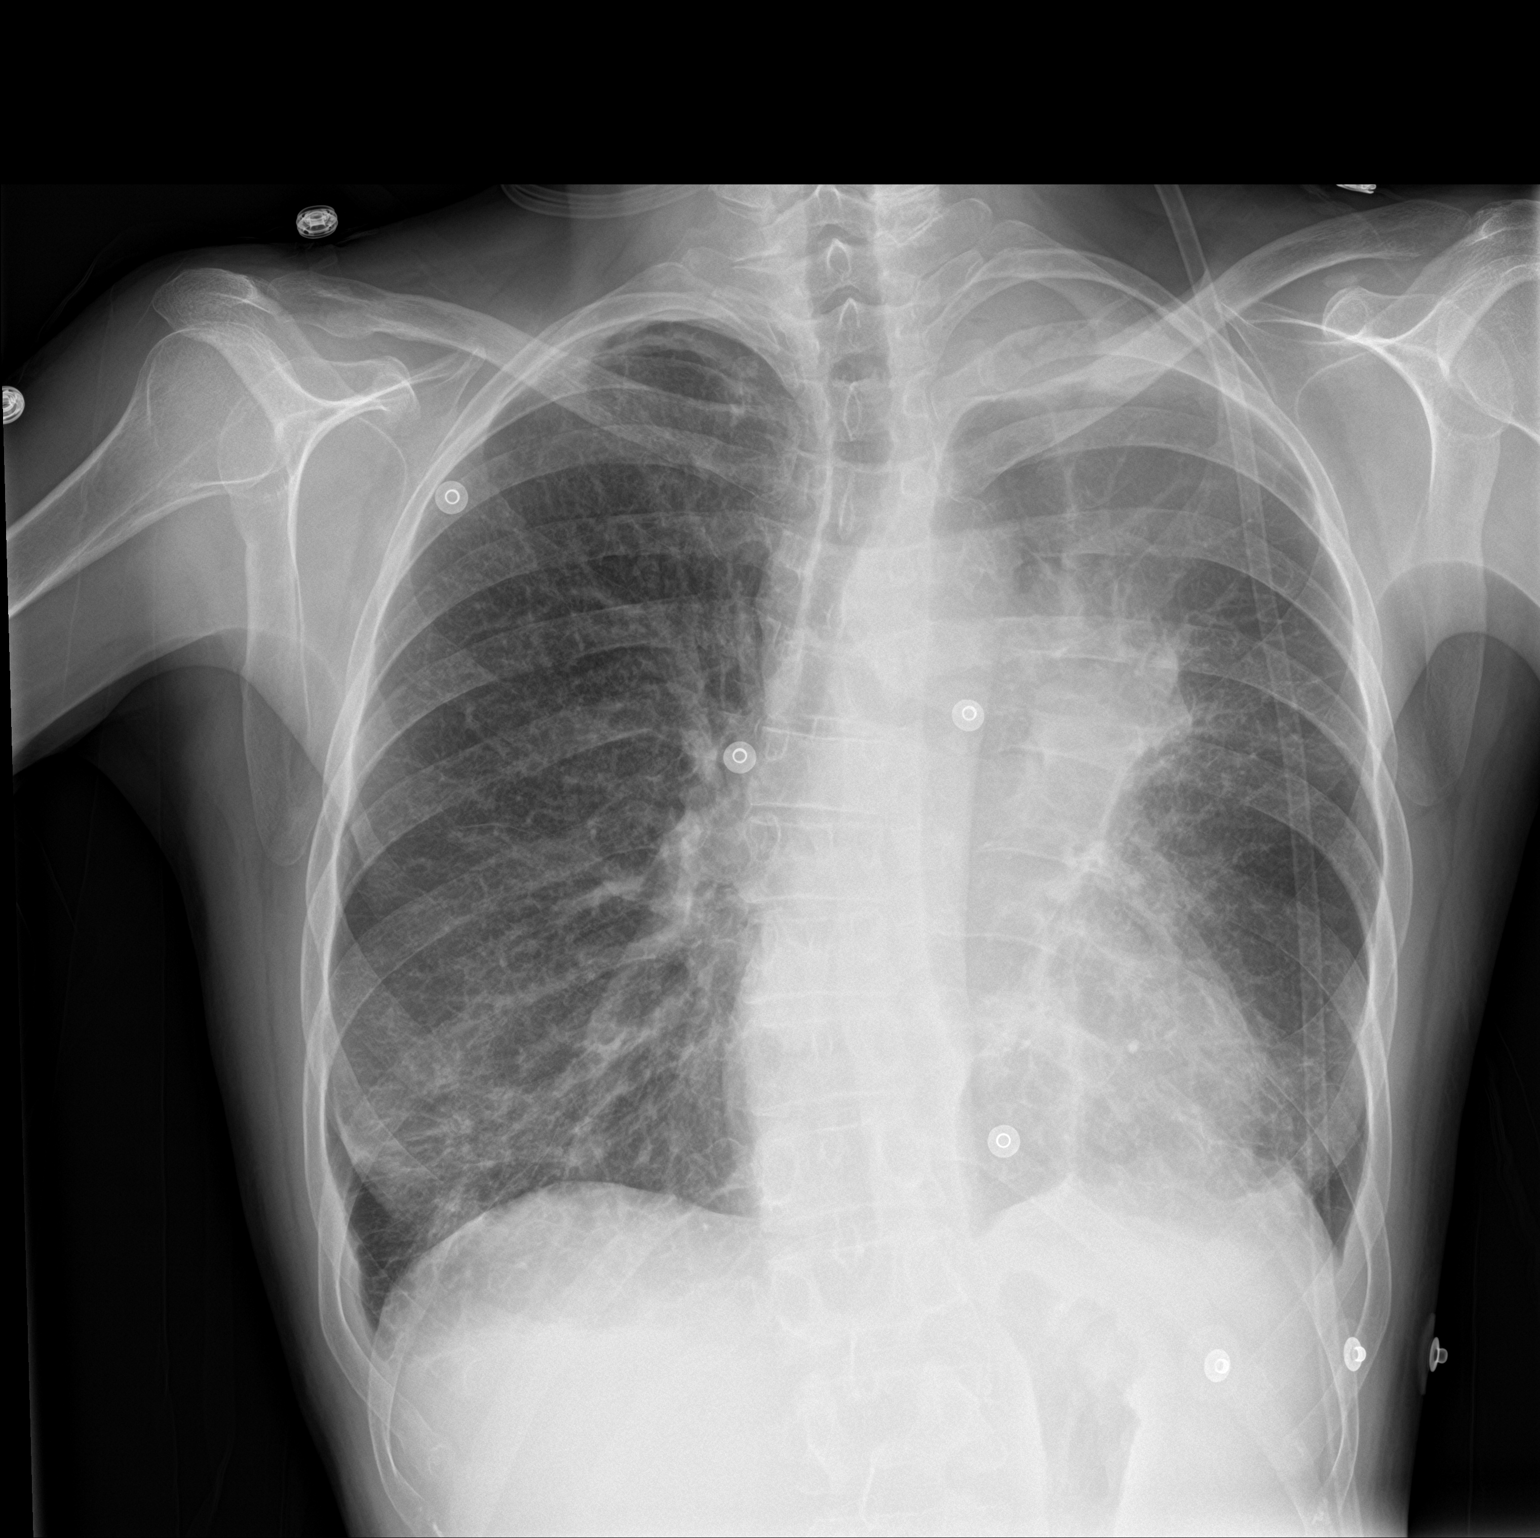

[chest lat]
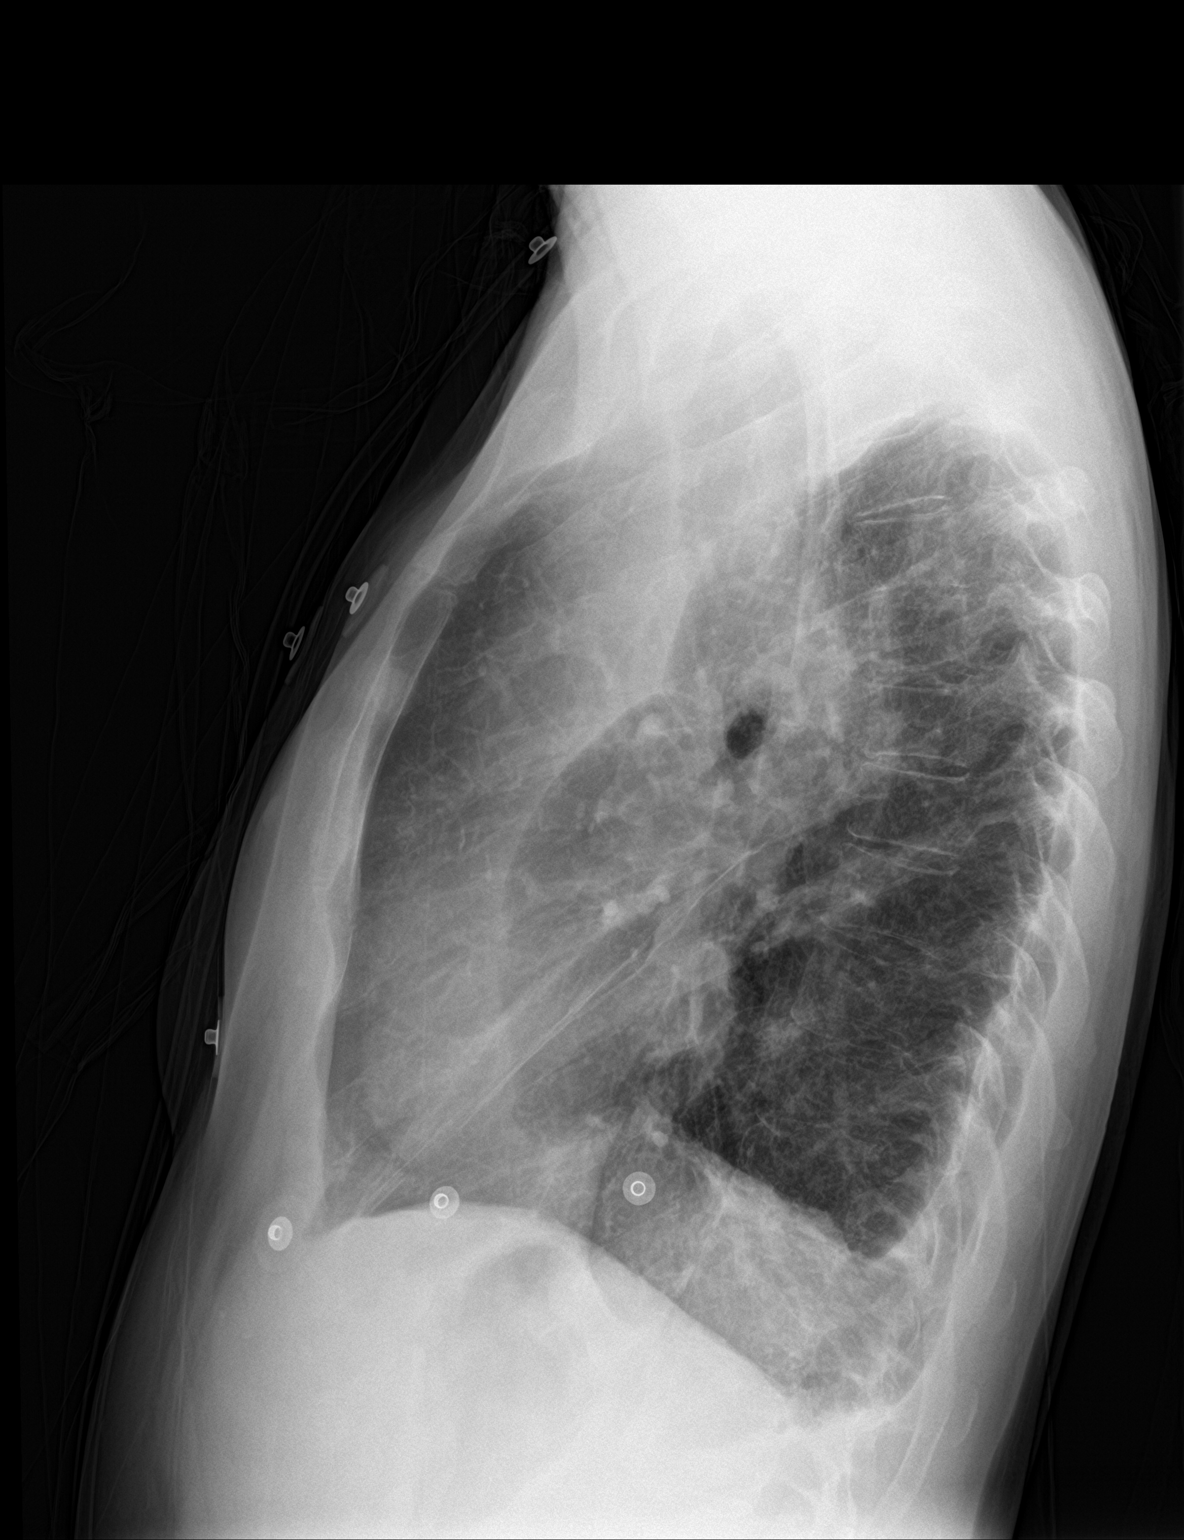

[2 of 2 positions shown; findings below may reference images not displayed]

FINDINGS: There is bilateral chronic interstitial thickening. The lungs are
hyperinflated likely secondary to COPD. There is no focal
parenchymal opacity. There is no pleural effusion or pneumothorax.
The heart and mediastinal contours are unremarkable.

The osseous structures are unremarkable.
IMPRESSION: No active cardiopulmonary disease.

Chronic interstitial disease.

## 2018-07-12 ENCOUNTER — Other Ambulatory Visit: Payer: Self-pay | Admitting: Nurse Practitioner

## 2018-08-02 ENCOUNTER — Encounter: Payer: Self-pay | Admitting: Oncology

## 2018-08-02 ENCOUNTER — Other Ambulatory Visit: Payer: Self-pay | Admitting: Nurse Practitioner

## 2019-05-13 IMAGING — MR MR HEAD WO/W CM
14 series · 48 of 48 positions shown · IV contrast (multihance)
Comparison: Head CT from earlier today.  Brain MRI 07/24/2016

CLINICAL DATA: Acute headache.  History of lung cancer.

EXAM:
MRI HEAD WITHOUT AND WITH CONTRAST
TECHNIQUE: Multiplanar, multiecho pulse sequences of the brain and surrounding
structures were obtained without and with intravenous contrast.
CONTRAST:  12mL MULTIHANCE GADOBENATE DIMEGLUMINE 529 MG/ML IV SOLN

[Series 2: T1 · sagittal · 5.0mm · 0.45mm/px · 2 of 27 slices shown (1 of 2)]
[im 1/27]
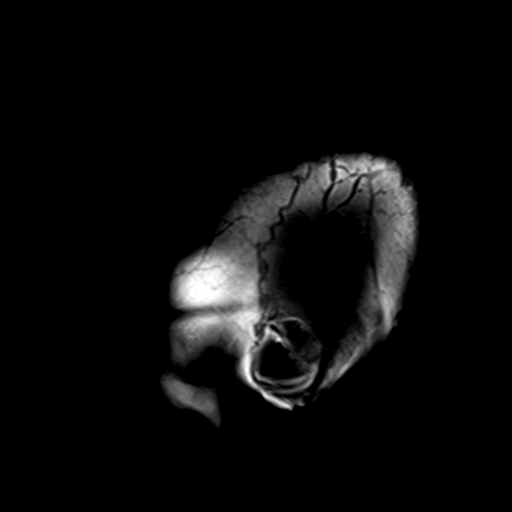
[im 27/27]
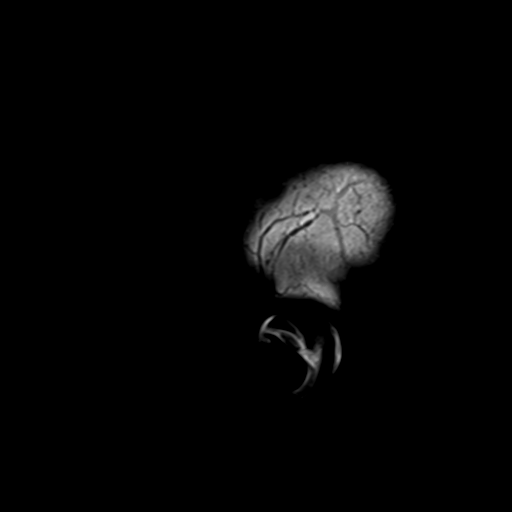

[Series 4: DWI · axial · 3.0mm · 1.80mm/px · z∈[-100,+59]mm · 4 of 52 slices shown (1 of 2)]
[im 1/52]
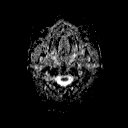
[im 18/52]
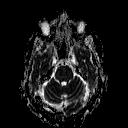
[im 35/52]
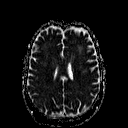
[im 52/52]
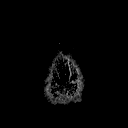

[Series 6: DWI · coronal · 3.0mm · 1.80mm/px · 2 of 46 slices shown (2 of 2)]
[im 1/46]
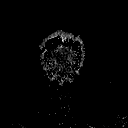
[im 46/46]
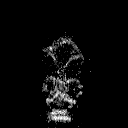

[Series 7: T2 · axial · 5.0mm · 0.60mm/px · 1 of 25 slices shown (1 of 2)]
[im 1/25]
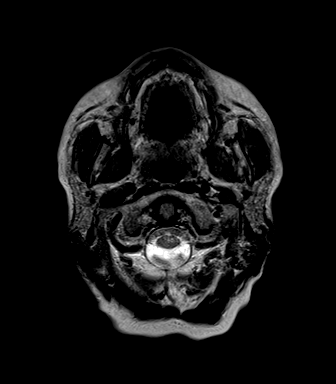

[Series 8: FLAIR · axial · 3.0mm · 0.45mm/px · z∈[-98,+55]mm · 3 of 53 slices shown]
[im 1/53]
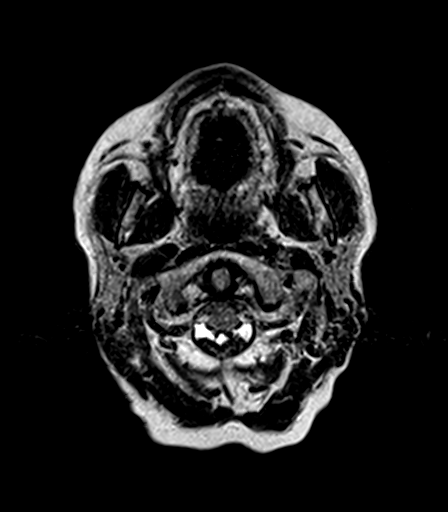
[im 27/53]
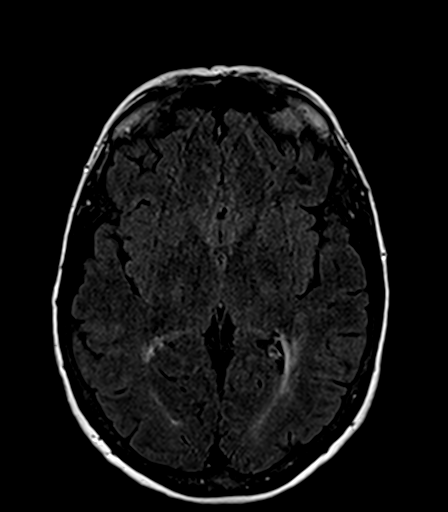
[im 53/53]
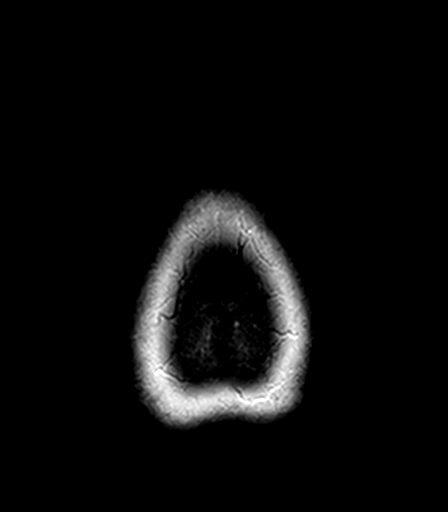

[Series 9: T2 · axial · 5.0mm · 0.45mm/px · 1 of 25 slices shown (2 of 2)]
[im 1/25]
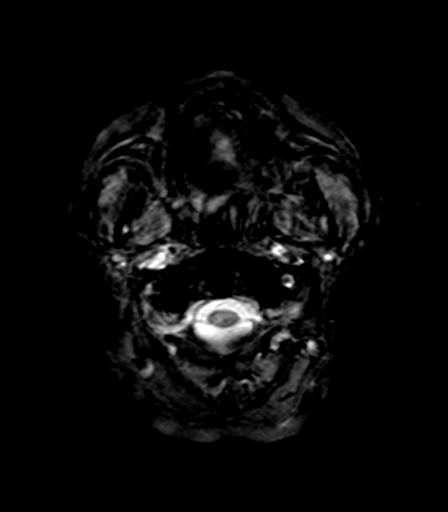

[Series 10: T1 · axial · 1.0mm · 1.00mm/px · z∈[-105,+67]mm · 9 of 176 slices shown (2 of 2)]
[im 1/176]
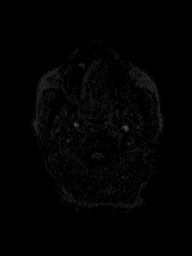
[im 22/176]
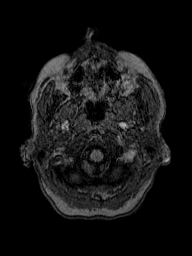
[im 44/176]
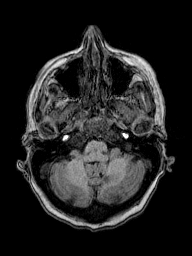
[im 66/176]
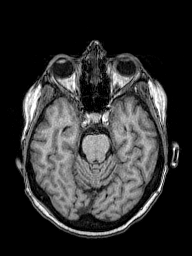
[im 88/176]
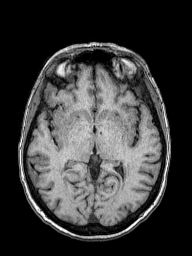
[im 110/176]
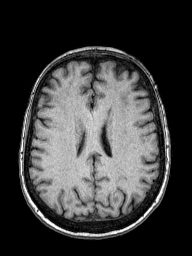
[im 132/176]
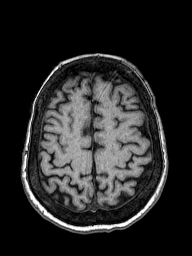
[im 154/176]
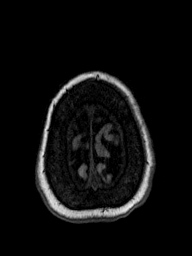
[im 176/176]
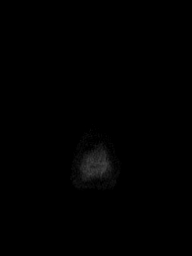

[Series 11: T2 post-contrast · coronal · 5.0mm · 0.49mm/px · 1 of 29 slices shown]
[im 1/29]
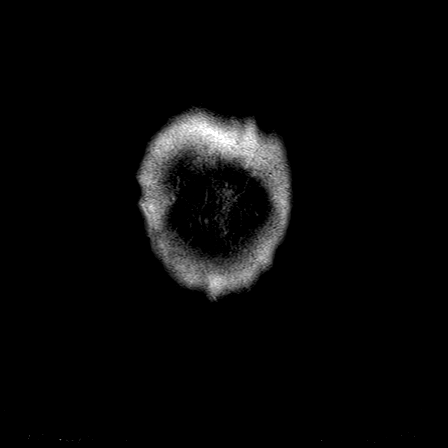

[Series 12: T1 post-contrast · axial · 1.0mm · 1.00mm/px · z∈[-105,+67]mm · 9 of 176 slices shown (1 of 4)]
[im 1/176]
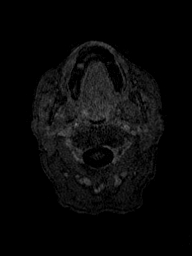
[im 22/176]
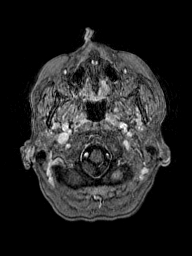
[im 44/176]
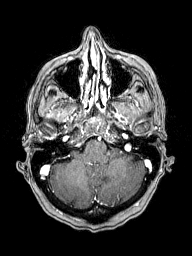
[im 66/176]
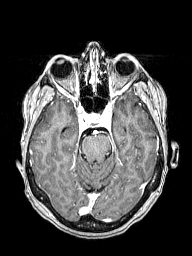
[im 88/176]
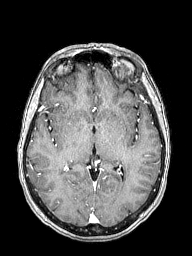
[im 110/176]
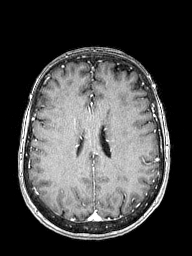
[im 132/176]
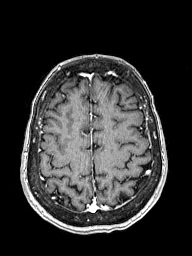
[im 154/176]
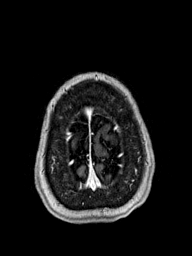
[im 176/176]
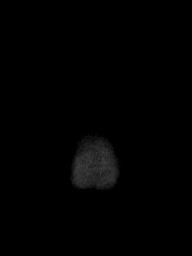

[Series 13: T1 post-contrast · axial · 1.0mm · 0.77mm/px · z∈[-107,+65]mm · 9 of 176 slices shown (2 of 4)]
[im 1/176]
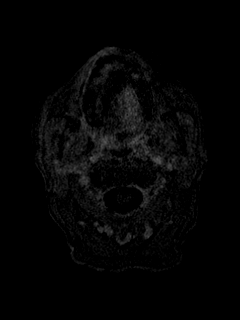
[im 22/176]
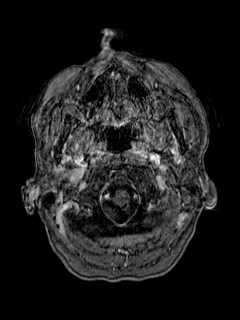
[im 44/176]
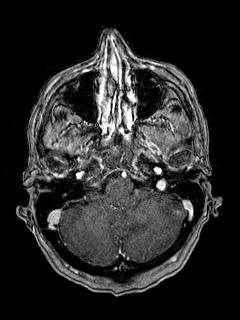
[im 66/176]
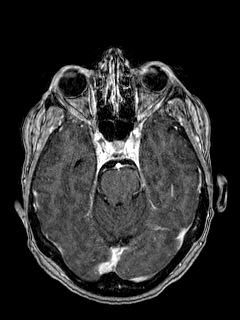
[im 88/176]
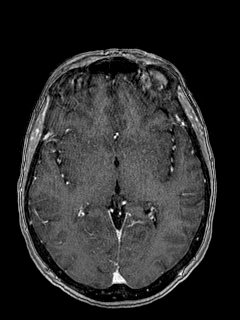
[im 110/176]
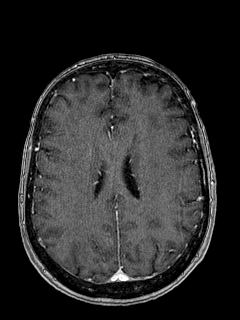
[im 132/176]
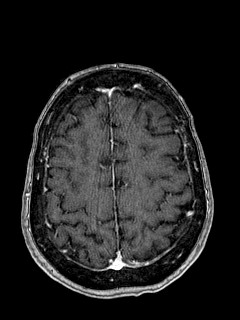
[im 154/176]
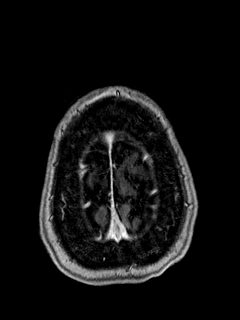
[im 176/176]
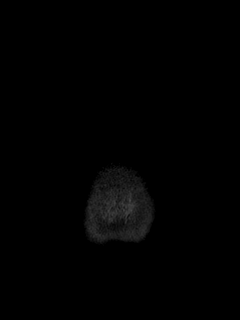

[Series 14: T1 post-contrast · coronal · 5.0mm · 0.43mm/px · 1 of 29 slices shown (3 of 4)]
[im 1/29]
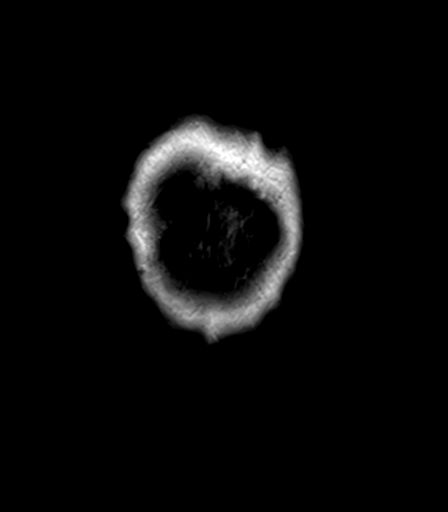

[Series 15: T1 post-contrast · sagittal · 5.0mm · 0.45mm/px · 1 of 27 slices shown (4 of 4)]
[im 1/27]
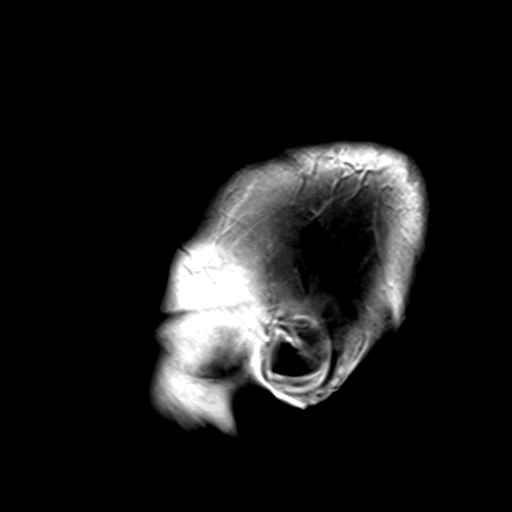

[Series 100: ax (id) · axial · 3.0mm · 1.80mm/px · z∈[-100,+59]mm · 3 of 55 slices shown]
[im 1/55]
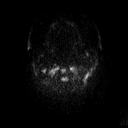
[im 28/55]
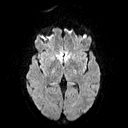
[im 55/55]
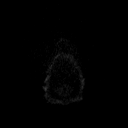

[Series 101: cor (id) · coronal · 3.0mm · 1.80mm/px · 2 of 47 slices shown]
[im 1/47]
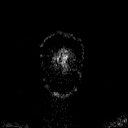
[im 47/47]
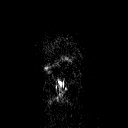

[48 of 48 positions shown; findings below may reference images not displayed]

FINDINGS: Brain: The left inferior cerebellar abnormality reflects an
enhancing superficial mass measuring up to 17 mm. The mass splays
folia but is still likely intra-axial. No second lesion is seen.

No infarct, hemorrhage, hydrocephalus, or collection. Chronic white
matter disease, usually microvascular ischemic.

Vascular: 3 mm enhancing outpouching extending inferiorly from the
right MCA bifurcation.

Skull and upper cervical spine: No noted metastatic lesion.

Sinuses/Orbits: No visible marrow lesion.

Other: These results were called by telephone at the time of
interpretation on 05/24/2017 at [DATE] to Dr. SAVIO LOCKLEAR
, who verbally acknowledged these results.
IMPRESSION: 1. 17 mm left inferior cerebellar mass consistent with metastatic
disease.
2. Probable 3 mm right MCA bifurcation aneurysm. At follow-up brain
MRI a MRA is recommended.
